# Patient Record
Sex: Female | Born: 1950
Health system: Southern US, Community
[De-identification: ages and names within clinical notes are randomized; demographics above are authoritative.]

## PROBLEM LIST (undated history)

## (undated) DIAGNOSIS — C801 Malignant (primary) neoplasm, unspecified: Secondary | ICD-10-CM

## (undated) DIAGNOSIS — J45909 Unspecified asthma, uncomplicated: Secondary | ICD-10-CM

## (undated) DIAGNOSIS — IMO0002 Reserved for concepts with insufficient information to code with codable children: Secondary | ICD-10-CM

## (undated) DIAGNOSIS — R112 Nausea with vomiting, unspecified: Secondary | ICD-10-CM

## (undated) DIAGNOSIS — D649 Anemia, unspecified: Secondary | ICD-10-CM

## (undated) DIAGNOSIS — E7800 Pure hypercholesterolemia, unspecified: Secondary | ICD-10-CM

## (undated) DIAGNOSIS — I499 Cardiac arrhythmia, unspecified: Secondary | ICD-10-CM

## (undated) DIAGNOSIS — C50919 Malignant neoplasm of unspecified site of unspecified female breast: Secondary | ICD-10-CM

## (undated) DIAGNOSIS — Z9889 Other specified postprocedural states: Secondary | ICD-10-CM

## (undated) DIAGNOSIS — T884XXA Failed or difficult intubation, initial encounter: Secondary | ICD-10-CM

## (undated) DIAGNOSIS — K219 Gastro-esophageal reflux disease without esophagitis: Secondary | ICD-10-CM

## (undated) DIAGNOSIS — Z923 Personal history of irradiation: Secondary | ICD-10-CM

## (undated) DIAGNOSIS — M069 Rheumatoid arthritis, unspecified: Secondary | ICD-10-CM

## (undated) HISTORY — DX: Pure hypercholesterolemia, unspecified: E78.00

## (undated) HISTORY — PX: PILONIDAL CYST EXCISION: SHX744

## (undated) HISTORY — DX: Malignant (primary) neoplasm, unspecified: C80.1

## (undated) HISTORY — DX: Reserved for concepts with insufficient information to code with codable children: IMO0002

## (undated) HISTORY — DX: Rheumatoid arthritis, unspecified: M06.9

## (undated) HISTORY — PX: COLPORRHAPHY: SHX921

## (undated) HISTORY — PX: FOOT SURGERY: SHX648

---

## 1987-02-16 HISTORY — PX: TOTAL VAGINAL HYSTERECTOMY: SHX2548

## 1998-01-29 ENCOUNTER — Other Ambulatory Visit: Admission: RE | Admit: 1998-01-29 | Discharge: 1998-01-29 | Payer: Self-pay | Admitting: Family Medicine

## 1998-07-24 ENCOUNTER — Ambulatory Visit (HOSPITAL_COMMUNITY): Admission: RE | Admit: 1998-07-24 | Discharge: 1998-07-24 | Payer: Self-pay | Admitting: Family Medicine

## 1999-12-03 ENCOUNTER — Ambulatory Visit (HOSPITAL_COMMUNITY): Admission: RE | Admit: 1999-12-03 | Discharge: 1999-12-03 | Payer: Self-pay

## 1999-12-18 ENCOUNTER — Ambulatory Visit (HOSPITAL_COMMUNITY): Admission: RE | Admit: 1999-12-18 | Discharge: 1999-12-18 | Payer: Self-pay

## 2000-01-01 ENCOUNTER — Ambulatory Visit (HOSPITAL_COMMUNITY): Admission: RE | Admit: 2000-01-01 | Discharge: 2000-01-01 | Payer: Self-pay

## 2001-02-15 HISTORY — PX: BREAST SURGERY: SHX581

## 2001-03-17 ENCOUNTER — Other Ambulatory Visit: Admission: RE | Admit: 2001-03-17 | Discharge: 2001-03-17 | Payer: Self-pay | Admitting: Obstetrics and Gynecology

## 2001-06-19 ENCOUNTER — Other Ambulatory Visit: Admission: RE | Admit: 2001-06-19 | Discharge: 2001-06-19 | Payer: Self-pay | Admitting: Obstetrics and Gynecology

## 2001-06-29 ENCOUNTER — Encounter: Payer: Self-pay | Admitting: Surgery

## 2001-06-29 ENCOUNTER — Encounter: Admission: RE | Admit: 2001-06-29 | Discharge: 2001-06-29 | Payer: Self-pay | Admitting: Surgery

## 2001-07-03 ENCOUNTER — Ambulatory Visit (HOSPITAL_BASED_OUTPATIENT_CLINIC_OR_DEPARTMENT_OTHER): Admission: RE | Admit: 2001-07-03 | Discharge: 2001-07-03 | Payer: Self-pay | Admitting: Surgery

## 2001-07-03 ENCOUNTER — Encounter: Payer: Self-pay | Admitting: Surgery

## 2001-07-03 ENCOUNTER — Encounter (INDEPENDENT_AMBULATORY_CARE_PROVIDER_SITE_OTHER): Payer: Self-pay | Admitting: *Deleted

## 2001-07-14 ENCOUNTER — Ambulatory Visit: Admission: RE | Admit: 2001-07-14 | Discharge: 2001-10-12 | Payer: Self-pay | Admitting: Surgery

## 2001-08-01 ENCOUNTER — Encounter: Admission: RE | Admit: 2001-08-01 | Discharge: 2001-08-01 | Payer: Self-pay | Admitting: Oncology

## 2001-08-01 ENCOUNTER — Encounter: Payer: Self-pay | Admitting: Oncology

## 2001-08-04 ENCOUNTER — Encounter: Payer: Self-pay | Admitting: Oncology

## 2001-08-04 ENCOUNTER — Ambulatory Visit (HOSPITAL_COMMUNITY): Admission: RE | Admit: 2001-08-04 | Discharge: 2001-08-04 | Payer: Self-pay | Admitting: Oncology

## 2001-08-28 ENCOUNTER — Other Ambulatory Visit: Admission: RE | Admit: 2001-08-28 | Discharge: 2001-08-28 | Payer: Self-pay | Admitting: Obstetrics and Gynecology

## 2001-08-30 ENCOUNTER — Ambulatory Visit (HOSPITAL_COMMUNITY): Admission: RE | Admit: 2001-08-30 | Discharge: 2001-08-30 | Payer: Self-pay | Admitting: Surgery

## 2001-08-30 ENCOUNTER — Encounter: Payer: Self-pay | Admitting: Surgery

## 2002-01-02 ENCOUNTER — Ambulatory Visit: Admission: RE | Admit: 2002-01-02 | Discharge: 2002-03-22 | Payer: Self-pay | Admitting: Radiation Oncology

## 2002-04-03 ENCOUNTER — Ambulatory Visit (HOSPITAL_BASED_OUTPATIENT_CLINIC_OR_DEPARTMENT_OTHER): Admission: RE | Admit: 2002-04-03 | Discharge: 2002-04-03 | Payer: Self-pay | Admitting: Surgery

## 2002-04-05 ENCOUNTER — Other Ambulatory Visit: Admission: RE | Admit: 2002-04-05 | Discharge: 2002-04-05 | Payer: Self-pay | Admitting: Obstetrics and Gynecology

## 2002-04-16 HISTORY — PX: PELVIC FLOOR REPAIR: SHX2192

## 2002-05-16 ENCOUNTER — Inpatient Hospital Stay (HOSPITAL_COMMUNITY): Admission: RE | Admit: 2002-05-16 | Discharge: 2002-05-17 | Payer: Self-pay | Admitting: Obstetrics and Gynecology

## 2002-09-18 ENCOUNTER — Other Ambulatory Visit: Admission: RE | Admit: 2002-09-18 | Discharge: 2002-09-18 | Payer: Self-pay | Admitting: Obstetrics and Gynecology

## 2002-10-12 ENCOUNTER — Ambulatory Visit (HOSPITAL_COMMUNITY): Admission: RE | Admit: 2002-10-12 | Discharge: 2002-10-12 | Payer: Self-pay | Admitting: *Deleted

## 2002-10-12 ENCOUNTER — Encounter: Payer: Self-pay | Admitting: Specialist

## 2002-11-16 HISTORY — PX: COLONOSCOPY: SHX174

## 2002-11-26 ENCOUNTER — Ambulatory Visit (HOSPITAL_COMMUNITY): Admission: RE | Admit: 2002-11-26 | Discharge: 2002-11-26 | Payer: Self-pay | Admitting: Gastroenterology

## 2003-06-27 ENCOUNTER — Other Ambulatory Visit: Admission: RE | Admit: 2003-06-27 | Discharge: 2003-06-27 | Payer: Self-pay | Admitting: Obstetrics and Gynecology

## 2003-09-05 ENCOUNTER — Encounter: Admission: RE | Admit: 2003-09-05 | Discharge: 2003-09-05 | Payer: Self-pay | Admitting: Family Medicine

## 2004-02-21 ENCOUNTER — Other Ambulatory Visit: Admission: RE | Admit: 2004-02-21 | Discharge: 2004-02-21 | Payer: Self-pay | Admitting: Obstetrics and Gynecology

## 2004-03-02 ENCOUNTER — Ambulatory Visit: Payer: Self-pay | Admitting: Oncology

## 2004-09-11 ENCOUNTER — Ambulatory Visit: Payer: Self-pay | Admitting: Oncology

## 2004-09-22 ENCOUNTER — Encounter: Admission: RE | Admit: 2004-09-22 | Discharge: 2004-09-22 | Payer: Self-pay | Admitting: Family Medicine

## 2004-10-10 ENCOUNTER — Encounter: Admission: RE | Admit: 2004-10-10 | Discharge: 2004-10-10 | Payer: Self-pay | Admitting: Neurosurgery

## 2005-03-15 ENCOUNTER — Ambulatory Visit: Payer: Self-pay | Admitting: Oncology

## 2005-05-24 ENCOUNTER — Other Ambulatory Visit: Admission: RE | Admit: 2005-05-24 | Discharge: 2005-05-24 | Payer: Self-pay | Admitting: Obstetrics and Gynecology

## 2005-09-07 ENCOUNTER — Ambulatory Visit: Payer: Self-pay | Admitting: Oncology

## 2005-09-13 LAB — CBC WITH DIFFERENTIAL/PLATELET
BASO%: 0.5 % (ref 0.0–2.0)
Basophils Absolute: 0 10e3/uL (ref 0.0–0.1)
EOS%: 3.9 % (ref 0.0–7.0)
Eosinophils Absolute: 0.2 10e3/uL (ref 0.0–0.5)
HCT: 36.6 % (ref 34.8–46.6)
HGB: 12.7 g/dL (ref 11.6–15.9)
LYMPH%: 22.3 % (ref 14.0–48.0)
MCH: 30.4 pg (ref 26.0–34.0)
MCHC: 34.7 g/dL (ref 32.0–36.0)
MCV: 87.5 fL (ref 81.0–101.0)
MONO#: 0.5 10e3/uL (ref 0.1–0.9)
MONO%: 7.3 % (ref 0.0–13.0)
NEUT#: 4.1 10e3/uL (ref 1.5–6.5)
NEUT%: 66 % (ref 39.6–76.8)
Platelets: 346 10e3/uL (ref 145–400)
RBC: 4.18 10e6/uL (ref 3.70–5.32)
RDW: 13.1 % (ref 11.3–14.5)
WBC: 6.2 10e3/uL (ref 3.9–10.0)
lymph#: 1.4 10e3/uL (ref 0.9–3.3)

## 2005-09-13 LAB — COMPREHENSIVE METABOLIC PANEL
Albumin: 4.6 g/dL (ref 3.5–5.2)
Alkaline Phosphatase: 57 U/L (ref 39–117)
BUN: 7 mg/dL (ref 6–23)
CO2: 30 mEq/L (ref 19–32)
Glucose, Bld: 82 mg/dL (ref 70–99)
Sodium: 142 mEq/L (ref 135–145)
Total Bilirubin: 0.5 mg/dL (ref 0.3–1.2)
Total Protein: 6.9 g/dL (ref 6.0–8.3)

## 2005-09-13 LAB — LACTATE DEHYDROGENASE: LDH: 146 U/L (ref 94–250)

## 2005-09-13 LAB — CANCER ANTIGEN 27.29: CA 27.29: 20 U/mL (ref 0–39)

## 2006-04-25 ENCOUNTER — Ambulatory Visit: Payer: Self-pay | Admitting: Oncology

## 2006-04-27 LAB — CBC WITH DIFFERENTIAL/PLATELET
BASO%: 0.2 % (ref 0.0–2.0)
Basophils Absolute: 0 10*3/uL (ref 0.0–0.1)
EOS%: 2.1 % (ref 0.0–7.0)
HGB: 12.2 g/dL (ref 11.6–15.9)
MCH: 28.9 pg (ref 26.0–34.0)
MCHC: 35.1 g/dL (ref 32.0–36.0)
MCV: 82.4 fL (ref 81.0–101.0)
MONO%: 7.2 % (ref 0.0–13.0)
RBC: 4.21 10*6/uL (ref 3.70–5.32)
RDW: 12.2 % (ref 11.3–14.5)
lymph#: 1.3 10*3/uL (ref 0.9–3.3)

## 2006-04-27 LAB — COMPREHENSIVE METABOLIC PANEL
ALT: 13 U/L (ref 0–35)
Alkaline Phosphatase: 65 U/L (ref 39–117)
CO2: 26 mEq/L (ref 19–32)
Creatinine, Ser: 0.58 mg/dL (ref 0.40–1.20)
Sodium: 141 mEq/L (ref 135–145)
Total Bilirubin: 0.4 mg/dL (ref 0.3–1.2)
Total Protein: 7 g/dL (ref 6.0–8.3)

## 2006-04-27 LAB — LACTATE DEHYDROGENASE: LDH: 149 U/L (ref 94–250)

## 2006-04-27 LAB — CANCER ANTIGEN 27.29: CA 27.29: 19 U/mL (ref 0–39)

## 2006-06-28 ENCOUNTER — Other Ambulatory Visit: Admission: RE | Admit: 2006-06-28 | Discharge: 2006-06-28 | Payer: Self-pay | Admitting: Obstetrics & Gynecology

## 2007-09-19 ENCOUNTER — Other Ambulatory Visit: Admission: RE | Admit: 2007-09-19 | Discharge: 2007-09-19 | Payer: Self-pay | Admitting: Obstetrics & Gynecology

## 2007-10-18 ENCOUNTER — Encounter: Payer: Self-pay | Admitting: Internal Medicine

## 2007-11-15 ENCOUNTER — Ambulatory Visit: Payer: Self-pay | Admitting: Internal Medicine

## 2007-11-15 DIAGNOSIS — R0602 Shortness of breath: Secondary | ICD-10-CM

## 2007-11-15 DIAGNOSIS — R0600 Dyspnea, unspecified: Secondary | ICD-10-CM | POA: Insufficient documentation

## 2007-11-25 DIAGNOSIS — G4700 Insomnia, unspecified: Secondary | ICD-10-CM | POA: Insufficient documentation

## 2008-12-16 HISTORY — PX: COLONOSCOPY: SHX174

## 2008-12-16 HISTORY — PX: ESOPHAGOGASTRODUODENOSCOPY ENDOSCOPY: SHX5814

## 2009-03-13 ENCOUNTER — Ambulatory Visit (HOSPITAL_COMMUNITY): Admission: RE | Admit: 2009-03-13 | Discharge: 2009-03-13 | Payer: Self-pay | Admitting: Rheumatology

## 2010-07-03 NOTE — Op Note (Signed)
NAME:  Kristy Perez, Kristy Perez                           ACCOUNT NO.:  [id number]   MEDICAL RECORD NO.:  [id number]                   PATIENT TYPE:  AMB   LOCATION:  ENDO                                 FACILITY:  [facility]   PHYSICIAN:  Anselmo Rod, M.D.               DATE OF BIRTH:  [birth year]   DATE OF PROCEDURE:  11/26/2002  DATE OF DISCHARGE:                                 OPERATIVE REPORT   PROCEDURE:  Screening colonoscopy.   ENDOSCOPIST:  Anselmo Rod, M.D.   INSTRUMENT USED:  Olympus video colonoscope.   INDICATIONS FOR PROCEDURE:  The patient is a 60 year old white female with a  personal history of breast cancer undergoing screening colonoscopy to rule  out colonic polyps, masses, etc.   PRE-PROCEDURE PREPARATION:  Informed consent was procured from the patient.  The patient was fasted for eight hours prior to the procedure and prepped  with a bottle of magnesium citrate and a gallon of GoLYTELY the night prior  to the procedure.   PRE-PROCEDURE PHYSICAL:  VITAL SIGNS:  Stable.  NECK:  Supple.  CHEST:  Clear to auscultation.  CARDIOVASCULAR:  S1 and S2 are regular.  ABDOMEN:  Soft with normal bowel sounds.   DESCRIPTION OF PROCEDURE:  The patient was placed in the left lateral  decubitus position and sedated with 30 mg of Demerol and 3 mg of Versed  intravenously.  Once the patient was adequately sedated and maintained on  low flow oxygen and continuous cardiac monitoring, the Olympus video  colonoscope was advanced from the rectum to the cecum with slight  difficulty.  The patient had extremely low blood pressures of 90/60 and then  80/24 and therefore no more sedation could be given.  The scope was easily  advanced into the cecum.  The appendiceal orifice and ileocecal valve were  visualized and photographed.  Small internal hemorrhoids were seen on  retroflexion of the rectum.  No other masses, polyps, erosions, ulcerations  or diverticula were noted.   IMPRESSION:  Normal colonoscopy to the cecum except for small internal  hemorrhoids.   RECOMMENDATIONS:  1. Continue a high fiber diet with liberal fluid intake.  2. Repeat colonoscopy screening in the next five years unless the patient     develops any abdominal symptoms in the interim.  3. Outpatient follow-up on a p.r.n. basis.                                               Anselmo Rod, M.D.    JNM/MEDQ  D:  11/26/2002  T:  11/26/2002  Job:  [id number]   cc:   Laqueta Linden, M.D.  [address]  [place]  [place] [address]  Fax: [phone]   Marjory Lies,  M.D.  [address]  [place]  [place] [address]  Fax: [phone]   Pierce Crane, M.D.  [address] - [facility]  [place]  [place] [address]  Fax: [phone]

## 2010-07-03 NOTE — Op Note (Signed)
[place]  Patient:    TAEYA, THEALL Visit Number: [id number] MRN: [id number]          Service Type: DSU Location: [facility] Attending Physician:  Katha Cabal Dictated by:   Thornton Park Daphine Deutscher, M.D. Proc. Date: 07/03/01 Admit Date:  07/03/2001 Discharge Date: 07/03/2001   CC:         Laqueta Linden, M.D.  Jeralyn Ruths, M.D.   Operative Report  PREOPERATIVE DIAGNOSIS:  A 60 year old lady with a palpable mass that has arisen in the left upper outer quadrant. Needle aspirate per Laqueta Linden, M.D. revealed malignant cells. She was seen in the office and informed consent was obtained regarding left sentinel node mapping and left breast lumpectomy.  POSTOPERATIVE DIAGNOSIS:  A 60 year old lady with a palpable mass that has arisen in the left upper outer quadrant. Needle aspirate per Laqueta Linden, M.D. revealed malignant cells. She was seen in the office and informed consent was obtained regarding left sentinel node mapping and left breast lumpectomy, pathology pending.  OPERATION PERFORMED:  Left breast needle biopsy and sentinel node biopsies.  SURGEON:  Thornton Park. Daphine Deutscher, M.D.  ANESTHESIA:  General/LMA.  DESCRIPTION OF PROCEDURE:  The patient was taken room #2 at [facility] after injection with radionuclide in the left breast lesion. Prior to prepping and draping, I did use some alcohol and Lymphazurin blue and injected that in the periareolar region and over the tumor. The patient was then prepped with Betadine and draped sterilely. I mapped the axilla and I found the hot spot and marked it and then created a small incision over that along the skin lines. I went up into the axilla and found some areas that were certainly not near each other and biopsied the closest one depthwise and removed a hot node and I put it down as a node one as "hot sentinel node." I then went slightly posterior and inferior and found one that  had counts of several thousand and dissected it free from the surrounding tissue. Using clips to clip the lymphatics - this was blue and hot and I called that the "hottest node" and the #1 sentinel node. Finally, there was still some activity and I found another one yet higher and posterior and I dissected it free and removed it as the third node. There was no other activity to speak of in the axilla. The axilla was irrigated. There was no bleeding or lymphazurin blue leaks. I then closed the skin with 4-0 Vicryl subcutaneously and subcuticularly and then began working with the breast lesion itself. Using new equipment, I made an incision over the breast mass and raised flaps superiorly and inferiorly using electrocautery. I carried these down inferiorly and superiorly, medially and laterally and came down to the chest wall. I then got around this when I could palpate using scissors, mobilized the lesion and brought it up into the wound. It was excised and its position was oriented with three sutures, the smallest near the nipple, the longest in the axilla and the medium one was posterior. The wound was irrigated. The skin was approximated with 4-0 Vicryl subcutaneously and subcuticularly with Benzoin and Steri-Strips on the skin. A fluffy dressing was applied.  DISPOSITION:  The patient will be given some Mepergan Fortis to take for pain and will be followed up in the office in approximately two weeks if not sooner. Dictated by:   Thornton Park Daphine Deutscher, M.D. Attending Physician:  Luretha Murphy  B[place] DD:  07/03/01 TD:  07/05/01 Job: [id number]

## 2010-07-03 NOTE — Op Note (Signed)
[facility]  Patient:    Kristy Perez, Kristy Perez Visit Number: [id number] MRN: [id number]          Service Type: DSU Location: DAY Attending Physician:  Katha Cabal Dictated by:   Thornton Park Daphine Deutscher, M.D. Proc. Date: 08/30/01 Admit Date:  08/30/2001 Discharge Date: 08/30/2001   CC:         Pierce Crane, M.D.  Delorse Lek, M.D.  Laqueta Linden, M.D.   Operative Report  PREOPERATIVE DIAGNOSIS:  Left breast cancer, status post lumpectomy and sentinel node biopsy for IV access.  POSTOPERATIVE DIAGNOSIS:  Left breast cancer, chest pain lumpectomy and sentinel node biopsy for IV access.  PROCEDURE:  Placement of right subclavian Port-A-Cath.  SURGEON:  Thornton Park. Daphine Deutscher, M.D.  ANESTHESIA:  MAC.  DESCRIPTION OF PROCEDURE:  Kristy Perez is a 60 year old lady, who recently underwent lumpectomy, axillary sentinel lymph node biopsy for left breast cancer.  She was taken to room 6 and given intravenous sedation.  The chest and neck were prepped with Betadine and draped sterilely.  I went on the right side and infiltrated an area beneath the clavicle with lidocaine and cannulated the right subclavian vein on the first pass.  The wire was passed into the superior vena cava and caval atrial junction.  I then created a pocket on the right chest, making a transverse incision after numbing that up with lidocaine and then using two fingers to subcutaneously dissect the pocket.  There was a superficial vessel which I ligated with 4-0 Vicryl.  The Port-A-Cath was then placed in the pocket, tunneled out to the exit site of the wire, and secured with two 2-0 Prolene sutures on either side of the port to secure it inside the cavity.  It was then flushed.  I then measured approximately 17 cm from the skin and transected the catheter at that point. I passed the obturator and peel-away sheath over the wire and then removed the obturator and wire and then passed  the catheter and peeled away the sheath under fluoroscopic control.  This placed the catheter in good position.  This was then flushed with concentrated heparin.  This flushing was done following an aspiration, at which time the Port-A-Cath aspirated easily.  The wounds were then closed with 4-0 Vicryl with Steri-Strips.  A right-angled Huber needle was inserted into the port and again, it was withdrawn back and flushed with concentrated heparin.  This was left connected to the port but clamped and a cap on the end.  She will be receiving chemotherapy in the next two days per Dr. Donnie Coffin.  The patient will be given Coumadin 1 mg to take 1 mg every other day for approval by Dr. Donnie Coffin and will be seen back in the office by me in several weeks for routine postop.  Chest x-ray will be obtained in the recovery room. Dictated by:   Thornton Park Daphine Deutscher, M.D. Attending Physician:  Katha Cabal DD:  08/30/01 TD:  09/02/01 Job: [id number]

## 2010-07-03 NOTE — Op Note (Signed)
NAME:  Kristy Perez, SCHNIDER                           ACCOUNT NO.:  [id number]   MEDICAL RECORD NO.:  [id number]                   PATIENT TYPE:  AMB   LOCATION:  DAY                                  FACILITY:  [facility]   PHYSICIAN:  Laqueta Linden, M.D.                 DATE OF BIRTH:  01-Mar-1950   DATE OF PROCEDURE:  05/15/2002  DATE OF DISCHARGE:                                 OPERATIVE REPORT   PREOPERATIVE DIAGNOSES:  1. Cystocele.  2. Rectocele.  3. Stress urinary incontinence.   POSTOPERATIVE DIAGNOSES:  1. Cystocele.  2. Rectocele.  3. Stress urinary incontinence.   PROCEDURES:  1. Anterior and posterior colporrhaphy.  2. SPARC sling procedure.   SURGEON:  Procedure #1 - Laqueta Linden, M.D.,  Procedure #2 - Bertram Millard. Dahlstedt, M.D.   ASSISTANT:  Procedure #1 - Seward Meth. Clelia Croft, M.D.   ANESTHESIA:  General endotracheal.   ESTIMATED BLOOD LOSS:  200 mL.   URINE OUTPUT:  100 mL.   FLUIDS:  1200 mL crystalloid.   INSTRUMENT COUNT:  Correct x2.   COMPLICATIONS:  None.   INDICATIONS FOR PROCEDURE:  The patient is a 60 year old menopausal female  with symptomatic cystocele and rectocele and stress urinary incontinence.  She underwent a vaginal hysterectomy and anterior repair approximately five  years ago with recurrent prolapse.  She also notes worsening of stress  urinary incontinence since that time, particularly with pessary placement.  She has recently undergone treatment for breast cancer including lumpectomy  with sentinel node radiation and chemotherapy.  She has had an Estring  vaginal ring in place.  She desires definitive surgical management.  She was  seen in consultation with Bertram Millard. Dahlstedt, M.D. who agreed that she  would benefit from a [facility] sling in addition to repair of her prolapse.  She  has been counseled as to the risks, benefits, alternatives, and  complications of the procedure including, but not limited to, anesthesia  risks, infection,  bleeding possibly requiring transfusion, injury to bowel,  bladder, ureteral, vessels, nodes, possible need for laparotomy, possibility  of fistula formation, the possibility of recurrent prolapse and/or urinary  incontinence such as urinary retention as well as risks of DVT, PE,  pneumonia, death, and other unknown risks.  She has voiced her understanding  and acceptance of all risks and had extensive discussions with both surgeons  and agrees to proceed.   PROCEDURE:  The patient was taken to the operating room.  After proper  identification and consents were ascertained,  she was placed on the  operating table in the supine position.  After the induction of general  endotracheal anesthesia, she was placed in the Pike stirrups in the dorsal  lithotomy position and the lower abdomen, perineum, and vagina were prepped  and draped in routine sterile fashion.  A transurethral Foley was placed.  A  weighted speculum was  placed in the posterior vagina.  The anal dimples were  grasped with Allis clamps and advanced into the operative field.  The  sterile injectable saline was then used for hydrodissection.  The vaginal  cuff was injected and a transverse incision made across the vaginal cuff  from uterosacral to uterosacral ligament.  This was then grasped with Allis  clamps and undermined and incised in the midline.  Successive injections of  sterile saline were then used and the vaginal mucosa was incised to the  level of the urethrovesical junction.  The vaginal mucosa was then sharply  and bluntly dissected off of the paravesical tissues.  At this point, Dr.  Retta Diones proceeded with the [facility] sling procedure which will be dictated in  a separate operative note.  At the conclusion of this, the cystocele was  reduced using interrupted sutures pulling the paravesical fascia to the  midline with reduction of the cystocele.  Redundant vaginal mucosa was  trimmed.  Several small bleeding points  were cauterized.  The anterior  vaginal wall was then closed in a running-locked fashion to the apex of the  vagina.  Attention was then turned posteriorly.  A triangular incision was  made at the introitus with excision of scar tissue.  The posterior vaginal  mucosa was then injected, undermined, and incised in the midline to the  level of the sutures of the anterior repair.  Again, the vaginal mucosa was  bluntly and sharply off of the perirectal tissues.  The uterosacral  ligaments were identified and were brought together in the midline to  decrease enterocele formation.  The perirectal fascia was then closed with  reduction of the rectocele with interrupted 2-0 Vicryl sutures.  Redundant  vaginal mucosa was trimmed and the posterior vaginal mucosa was then closed  in a running-locked fashion.  The levators were pulled together in the  midline for additional perineal support, taking care not to over-narrow the  introitus.  The perineum was closed in a routine episiotomy fashion.  Rectal  examination confirmed good thickness of the rectovaginal septum with no  palpable sutures through the rectal mucosa.  Vaginal packing of 1-inch plain  gauze soaked with estrogen cream was then placed.  Peri-Pad was applied.  The Foley catheter was connected to straight drainage.  The patient will be  admitted for overnight observation with removal of the vaginal pack and  Foley in the morning and assessment of voiding status prior to discharge.  She was stable and extubated on transfer to the recovery room.  Estimated  blood loss 200 mL.  Urine output 100 mL.  Fluids 1200 mL of crystalloid.  Counts correct x2.  Complications none.                                               Laqueta Linden, M.D.    LKS/MEDQ  D:  05/15/2002  T:  05/15/2002  Job:  [id number]   cc:   Thornton Park Daphine Deutscher, M.D.  [address], [address]  [place]  [place] [address]  Fax: [phone]   Pierce Crane, M.D. [address] -  [facility]  [place]  [place] [address]  Fax: [phone]   Bertram Millard. Dahlstedt, M.D.  [address]  [place]  [place] [address]  Fax: [phone]   Billie Lade, M.D.  [phone]  [facility] - [facility]  [place]  [place]  [address]  Fax: [phone]

## 2010-07-03 NOTE — Op Note (Signed)
NAME:  Kristy Perez, Kristy Perez                           ACCOUNT NO.:  [id number]   MEDICAL RECORD NO.:  [id number]                   PATIENT TYPE:  INP   LOCATION:  0467                                 FACILITY:  [facility]   PHYSICIAN:  Bertram Millard. Dahlstedt, M.D.          DATE OF BIRTH:  [birth year]   DATE OF PROCEDURE:  05/15/2002  DATE OF DISCHARGE:  05/17/2002                                 OPERATIVE REPORT   PREOPERATIVE DIAGNOSIS:  Stress urinary incontinence.   POSTOPERATIVE DIAGNOSIS:  Stress urinary incontinence.   PROCEDURE:  SPARC vaginal sling.   SURGEON:  Bertram Millard. Dahlstedt, M.D.   ANESTHESIA:  General endotracheal.   ESTIMATED BLOOD LOSS:  50 cc.   COMPLICATIONS:  None.   INDICATIONS FOR PROCEDURE:  A 60 year old menopausal female with a history  of stress urinary incontinence.  She has a symptomatic cystocele and  rectocele. She was sent to see me by Dr. Myrlene Broker for evaluation of stress  urinary incontinence.  It was anticipated the patient would have surgical  repair of her pelvic relaxation.  In the office, she was found to have  simple stress urinary incontinence. There was no evidence of bladder  instability. She had normal bladder emptying and no history of infections.   It was recommended that if she was going to undergo gynecologic procedure,  that we add a surgical procedure to cure her stress urinary incontinence.  Options were discussed with the patient. Due to its limited invasive nature,  I felt that the [facility] procedure would be the best fit for the patient. She  desires to proceed with this. She is aware of the risks and complications  and desires to proceed.   DESCRIPTION OF PROCEDURE:  The patient had been given preoperative  antibiotics.  The majority of the anterior repair had been completed by Dr.  Myrlene Broker.  At this point, I came in. The vaginal mucosa had already been  incised anteriorly in the vaginal wall.  The bladder neck was quite  evident,  as there was a catheter present. The balloon was palpated.   Two small incisions were made in the pubic area just to the left and right  of the midline. Through these, the [facility] needles were passed posterior to  the pubis bilaterally.  Once these were passed through the retropubic space  and through the vaginal incision bilaterally, the scopic view of the bladder  revealed no evident puncture of the bladder wall. I then put the [facility] tape  on the free ends of the needles and brought the tape up through the  retropubic space, through the skin.  Adequate positioning was felt to have  been attained at the mid-urethra.  A small amount of slack was left in this  area.  At this point, the plastic covering on the [facility] tape was removed.  Again, the mid-urethra was inspected and there was a small amount  of slack  just underneath the urethra with this tape.  The vaginal tape was cut  underneath both the skin incisions.  Skin was closed with Dermabond.   At this point, the [facility] procedure was terminated.  The bladder was  catheterized with a 15 French Foley catheter and hooked to dependent  drainage.  At this point, the operation by Dr. Katrinka Blazing then recommenced. The  patient tolerated the [facility] procedure well.                                                  Bertram Millard. Dahlstedt, M.D.    SMD/MEDQ  D:  05/30/2002  T:  05/30/2002  Job:  [id number]

## 2010-07-03 NOTE — Op Note (Signed)
   NAME:  Kristy Perez, Kristy Perez                           ACCOUNT NO.:  [id number]   MEDICAL RECORD NO.:  [id number]                   PATIENT TYPE:  AMB   LOCATION:  DSC                                  FACILITY:  [facility]   PHYSICIAN:  Thornton Park. Daphine Deutscher, M.D.             DATE OF BIRTH:  03/04/1950   DATE OF PROCEDURE:  04/03/2002  DATE OF DISCHARGE:                                 OPERATIVE REPORT   PREOPERATIVE DIAGNOSIS:  Right subclavian Port-A-Cath.   POSTOPERATIVE DIAGNOSIS:  Right subclavian Port-A-Cath, status post removal.   OPERATION:  Explantation of the Port-A-Cath device.   SURGEON:  Thornton Park. Daphine Deutscher, M.D.   ANESTHESIA:  MAC local   DESCRIPTION OF PROCEDURE:  The patient was taken to room 3 and given  significant sedation.  Her right breast was prepped with Betadine and draped  sterilely.  I infiltrated the area with 1% Lidocaine and excised her old  scar.  I then dissected down to the port and removed the two Prolene sutures  holding it to the chest wall.  It was explanted without difficulty and in  toto.  The wound was approximated with 5-0 Vicryl both inside and  subcuticularly in interrupted fashion.  Benzoin and Steri-Strips were used  on the skin.  Per patient request, the patient was given [employer] as-  needed for pain.  She will be seen back in the office in approximately three  weeks in follow up.   FINAL DIAGNOSIS:  Status post lumpectomy, radiation chemotherapy for left  breast cancer, status post explantation of Port-A-Cath.                                                Thornton Park Daphine Deutscher, M.D.    MBM/MEDQ  D:  04/03/2002  T:  04/03/2002  Job:  [id number]   cc:   Marjory Lies, M.D.  [address]  [place]  [place] [address]  Fax: [phone]   Laqueta Linden, M.D.  [address]  [place]  [place] [address]  Fax: [phone]   Pierce Crane, M.D.  [address] - [facility]  [place]  [place] [address]  Fax: [phone]

## 2012-12-19 ENCOUNTER — Encounter: Payer: Self-pay | Admitting: Nurse Practitioner

## 2012-12-19 ENCOUNTER — Ambulatory Visit (INDEPENDENT_AMBULATORY_CARE_PROVIDER_SITE_OTHER): Payer: 59 | Admitting: Nurse Practitioner

## 2012-12-19 VITALS — BP 110/62 | HR 68 | Resp 16 | Ht 62.0 in | Wt 131.0 lb

## 2012-12-19 DIAGNOSIS — Z0000 Encounter for general adult medical examination without abnormal findings: Secondary | ICD-10-CM

## 2012-12-19 DIAGNOSIS — Z01419 Encounter for gynecological examination (general) (routine) without abnormal findings: Secondary | ICD-10-CM

## 2012-12-19 DIAGNOSIS — M810 Age-related osteoporosis without current pathological fracture: Secondary | ICD-10-CM

## 2012-12-19 LAB — POCT URINALYSIS DIPSTICK
Bilirubin, UA: NEGATIVE
Ketones, UA: NEGATIVE
Leukocytes, UA: NEGATIVE
pH, UA: 7

## 2012-12-19 MED ORDER — VITAMIN D (ERGOCALCIFEROL) 1.25 MG (50000 UNIT) PO CAPS: 50000.0000 [IU] | ORAL_CAPSULE | ORAL | Status: DC

## 2012-12-19 MED ORDER — ESTRADIOL 0.1 MG/GM VA CREA: TOPICAL_CREAM | VAGINAL | Status: DC

## 2012-12-19 NOTE — Patient Instructions (Signed)

## 2012-12-19 NOTE — Progress Notes (Signed)
Patient ID: Kristy Perez, female   DOB: 1950-09-02, 62 y.o.   MRN: [id number] 62 y.o. [id number] Divorced Caucasian Fe here for annual exam.  No new health concerns other than continual joint pain from RA.  She is not SA in years.  She only uses estrace vaginal cream pea size amount to the urethra once every 2 weeks.  No LMP recorded. Patient has had a hysterectomy.          Sexually active: no  The current method of family planning is abstinence.    Exercising: no  Exercise is limited by rheumatoid arthritis. Smoker:  no  Health Maintenance: Pap:  05/21/09, WNL MMG:  08/15/12, Brads 2: benign findings Colonoscopy:  11/10, repeat in 5 years BMD:   9/11 TDaP:  UTD Labs: HB: declined Urine: negative   reports that she quit smoking about 13 years ago. Her smoking use included Cigarettes. She smoked 0.00 packs per day. She has never used smokeless tobacco. She reports that she does not drink alcohol.  Past Medical History  Diagnosis Date  . Cancer     breast, left  . Rheumatoid arthritis     on Humira  . Chiari malformation     Past Surgical History  Procedure Laterality Date  . Total vaginal hysterectomy  1989    with AP repair  . Colporrhaphy    . Breast surgery Left 2003    Lumpectomy T1C1, NO and negative ER/ PR  . Pelvic floor repair  3/04    SPARC with AP colporrhaphy  . Colonoscopy  10/04  . Colonoscopy  11/10    normal recheck in 5 years  . Esophagogastroduodenoscopy endoscopy  11/10    GERD  . Foot surgery Left age 76    removal of pseudo rheumatoid nodules from left forefoot.    Current Outpatient Prescriptions  Medication Sig Dispense Refill  . diazepam (VALIUM) 5 MG tablet Take 1 tablet by mouth as needed.      . Flaxseed, Linseed, (FLAX SEED OIL PO) Take by mouth.      Marland Kitchen HUMIRA 40 MG/0.8ML injection Inject 40 mg into the skin every 14 (fourteen) days.      . Ibuprofen 200 MG CAPS Take 1-2 capsules by mouth daily.      Marland Kitchen lidocaine (LIDODERM) 5 % Place 1 patch onto  the skin as needed.      . Magnesium 250 MG TABS Take 4 tablets by mouth daily.      . mometasone-formoterol (DULERA) 100-5 MCG/ACT AERO Inhale 2 puffs into the lungs daily.      Marland Kitchen omeprazole (PRILOSEC) 40 MG capsule Take 2 capsules by mouth daily.      . predniSONE (DELTASONE) 5 MG tablet Take 1.5 tablets by mouth daily.      . promethazine (PHENERGAN) 25 MG tablet Take 1 tablet by mouth as needed.      . RESTASIS 0.05 % ophthalmic emulsion Place 1 drop into both eyes 2 (two) times daily.      . temazepam (RESTORIL) 30 MG capsule Take 1 capsule by mouth at bedtime.      . VENTOLIN HFA 108 (90 BASE) MCG/ACT inhaler Inhale 2 puffs into the lungs every 4 (four) hours as needed.      . Vitamin D, Ergocalciferol, (DRISDOL) 50000 UNITS CAPS capsule Take 1 capsule (50,000 Units total) by mouth once a week.  30 capsule  3  . estradiol (ESTRACE) 0.1 MG/GM vaginal cream Use 1/2 g vaginally twice weekly  42.5 g  3   No current facility-administered medications for this visit.    Family History  Problem Relation Age of Onset  . Diabetes Mother   . Heart failure Mother   . Bladder Cancer Mother   . Stroke Mother   . Uterine cancer Maternal Aunt   . Bladder Cancer Maternal Grandmother   . Colon cancer Maternal Grandmother   . Cancer Father     head and neck    ROS:  Pertinent items are noted in HPI.  Otherwise, a comprehensive ROS was negative.  Exam:   BP 110/62  Pulse 68  Resp 16  Ht 5\' 2"  (1.575 m)  Wt 131 lb (59.421 kg)  BMI 23.95 kg/m2 Height: 5\' 2"  (157.5 cm)  Ht Readings from Last 3 Encounters:  12/19/12 5\' 2"  (1.575 m)    General appearance: alert, cooperative and appears stated age Head: Normocephalic, without obvious abnormality, atraumatic Neck: no adenopathy, supple, symmetrical, trachea midline and thyroid normal to inspection and palpation Lungs: clear to auscultation bilaterally Breasts: normal appearance, no masses or tenderness, on the right.  But surgical and  radiation changes to left breast Heart: regular rate and rhythm Abdomen: soft, non-tender; no masses,  no organomegaly Extremities: extremities normal, atraumatic, no cyanosis or edema Skin: Skin color, texture, turgor normal. No rashes or lesions Lymph nodes: Cervical, supraclavicular, and axillary nodes normal. No abnormal inguinal nodes palpated Neurologic: Grossly normal   Pelvic: External genitalia:  no lesions              Urethra:  Pale atrophic changes appearing urethra with no masses, tenderness or lesions              Bartholin's and Skene's: normal                 Vagina: atrophic  appearing vagina with pale color and discharge, no lesions              Cervix: absent              Pap taken: no Bimanual Exam:  Uterus:  uterus absent              Adnexa: no mass, fullness, tenderness               Rectovaginal: Confirms               Anus:  normal sphincter tone, no lesions  A:  Well Woman with normal exam  S/P left breast Cancer 6/03 with ER/PR negative  Urethral prolapse using vaginal estrogen only prn to the urethra.  RA  S/P TVH with AP repair 1989  Osteoporosis  Stable at 2011  Vit D deficiency  P:   Pap smear as per guidelines   Mammogram due 7/15  Order placed for BMD  Note given to patient for labs to be done at rheumatologist for  Vit D and send results here.  Refill on Estrace vaginal cream to use pea size to the urethra only prn.  Counseled on breast self exam, adequate intake of calcium and vitamin D, diet and exercise return annually or prn  An After Visit Summary was printed and given to the patient.

## 2012-12-20 NOTE — Progress Notes (Signed)
Encounter reviewed by Dr. Brook Silva.  

## 2013-01-04 ENCOUNTER — Other Ambulatory Visit: Payer: Self-pay | Admitting: Nurse Practitioner

## 2013-01-12 LAB — VITAMIN D 25 HYDROXY (VIT D DEFICIENCY, FRACTURES): COMMENTS:: 42 ng/mL (ref 30–89)

## 2013-02-15 DIAGNOSIS — I499 Cardiac arrhythmia, unspecified: Secondary | ICD-10-CM

## 2013-02-15 HISTORY — DX: Cardiac arrhythmia, unspecified: I49.9

## 2013-05-02 ENCOUNTER — Encounter (HOSPITAL_COMMUNITY): Payer: Self-pay | Admitting: Emergency Medicine

## 2013-05-02 ENCOUNTER — Emergency Department (HOSPITAL_COMMUNITY): Payer: BC Managed Care – PPO

## 2013-05-02 ENCOUNTER — Emergency Department (HOSPITAL_COMMUNITY)
Admission: EM | Admit: 2013-05-02 | Discharge: 2013-05-02 | Disposition: A | Discharge status: Home / Self Care | Payer: BC Managed Care – PPO | Attending: Emergency Medicine | Admitting: Emergency Medicine

## 2013-05-02 DIAGNOSIS — M25559 Pain in unspecified hip: Secondary | ICD-10-CM | POA: Insufficient documentation

## 2013-05-02 DIAGNOSIS — M25519 Pain in unspecified shoulder: Secondary | ICD-10-CM | POA: Insufficient documentation

## 2013-05-02 DIAGNOSIS — Z853 Personal history of malignant neoplasm of breast: Secondary | ICD-10-CM | POA: Insufficient documentation

## 2013-05-02 DIAGNOSIS — Z87891 Personal history of nicotine dependence: Secondary | ICD-10-CM | POA: Insufficient documentation

## 2013-05-02 DIAGNOSIS — IMO0002 Reserved for concepts with insufficient information to code with codable children: Secondary | ICD-10-CM | POA: Insufficient documentation

## 2013-05-02 DIAGNOSIS — Z79899 Other long term (current) drug therapy: Secondary | ICD-10-CM | POA: Insufficient documentation

## 2013-05-02 DIAGNOSIS — M069 Rheumatoid arthritis, unspecified: Secondary | ICD-10-CM | POA: Insufficient documentation

## 2013-05-02 DIAGNOSIS — M25551 Pain in right hip: Secondary | ICD-10-CM

## 2013-05-02 DIAGNOSIS — M79609 Pain in unspecified limb: Secondary | ICD-10-CM | POA: Insufficient documentation

## 2013-05-02 MED ORDER — PROMETHAZINE HCL 25 MG/ML IJ SOLN
12.5000 mg | Freq: Once | INTRAMUSCULAR | Status: DC
  Filled 2013-05-02: qty 1

## 2013-05-02 MED ORDER — PROMETHAZINE HCL 25 MG/ML IJ SOLN
12.5000 mg | Freq: Once | INTRAMUSCULAR | Status: AC
  Administered 2013-05-02: 12.5 mg via INTRAMUSCULAR

## 2013-05-02 MED ORDER — OXYCODONE-ACETAMINOPHEN 5-325 MG PO TABS: 1.0000 | ORAL_TABLET | ORAL | Status: DC | PRN

## 2013-05-02 MED ORDER — OXYCODONE-ACETAMINOPHEN 5-325 MG PO TABS
1.0000 | ORAL_TABLET | Freq: Once | ORAL | Status: AC
  Administered 2013-05-02: 1 via ORAL
  Filled 2013-05-02: qty 1

## 2013-05-02 NOTE — ED Notes (Signed)
Per EMS, patient has rheumatoid arthritis and has chronic pain.  Patient has had L shoulder pain for 1 year and is supposed to "have surgery for a tear".   Patient states she was fine yesterday and worked.  Patient states this morning she woke up with severe hip and R leg pain, but denies injury.  Patient states is unable to walk at this time.

## 2013-05-02 NOTE — ED Provider Notes (Signed)
CSN: [id number]     Arrival date & time 05/02/13  8676 History  This chart was scribed for non-physician practitioner Charlann Lange PA-C working with Juanda Crumble B. Karle Starch, MD by Rolanda Lundborg, ED Scribe. This patient was seen in room TR11C/TR11C and the patient's care was started at 10:01 AM.    Chief Complaint  Patient presents with  . Hip Pain  . Shoulder Pain  . Leg Pain   The history is provided by the patient. No language interpreter was used.   HPI Comments: Kristy Perez is a 63 y.o. female brought in by ambulance with a h/o rheumatoid arthritis who presents to the Emergency Department complaining of severe, constant pain in her right hip and leg since last night. She states her hips have bothered her before but not like this. She denies back pain or h/o back problems. States she has been unable to ambulate. She denies injury. She states she had no pain during the day yesterday. She has been taking ibuprofen. She states she takes hydrocodone occasionally but it makes her vomit.    Pt also reports left shoulder pain that has been ongoing for the past year and states she is supposed to get surgery for a "tear" tomorrow.   Past Medical History  Diagnosis Date  . Cancer     breast, left  . Rheumatoid arthritis     on Humira  . Chiari malformation    Past Surgical History  Procedure Laterality Date  . Total vaginal hysterectomy  1989    with AP repair  . Colporrhaphy    . Breast surgery Left 2003    Lumpectomy T1C1, NO and negative ER/ PR  . Pelvic floor repair  3/04    SPARC with AP colporrhaphy  . Colonoscopy  10/04  . Colonoscopy  11/10    normal recheck in 5 years  . Esophagogastroduodenoscopy endoscopy  11/10    GERD  . Foot surgery Left age 28    removal of pseudo rheumatoid nodules from left forefoot.   Family History  Problem Relation Age of Onset  . Diabetes Mother   . Heart failure Mother   . Bladder Cancer Mother   . Stroke Mother   . Uterine cancer Maternal  Aunt   . Bladder Cancer Maternal Grandmother   . Colon cancer Maternal Grandmother   . Cancer Father     head and neck   History  Substance Use Topics  . Smoking status: Former Smoker    Types: Cigarettes    Quit date: 02/16/1999  . Smokeless tobacco: Never Used  . Alcohol Use: No   OB History   Grav Para Term Preterm Abortions TAB SAB Ect Mult Living   3 2 2  1     2      Review of Systems  All other systems reviewed and are negative.      Allergies  Iodine and Sulfonamide derivatives  Home Medications   Current Outpatient Rx  Name  Route  Sig  Dispense  Refill  . diazepam (VALIUM) 5 MG tablet   Oral   Take 1 tablet by mouth as needed.         Marland Kitchen estradiol (ESTRACE) 0.1 MG/GM vaginal cream      Use 1/2 g vaginally twice weekly   42.5 g   3   . Flaxseed, Linseed, (FLAX SEED OIL PO)   Oral   Take by mouth.         Marland Kitchen HUMIRA 40  MG/0.8ML injection   Subcutaneous   Inject 40 mg into the skin every 14 (fourteen) days.         . Ibuprofen 200 MG CAPS   Oral   Take 1-2 capsules by mouth daily.         Marland Kitchen lidocaine (LIDODERM) 5 %   Transdermal   Place 1 patch onto the skin as needed.         . Magnesium 250 MG TABS   Oral   Take 4 tablets by mouth daily.         . mometasone-formoterol (DULERA) 100-5 MCG/ACT AERO   Inhalation   Inhale 2 puffs into the lungs daily.         Marland Kitchen omeprazole (PRILOSEC) 40 MG capsule   Oral   Take 2 capsules by mouth daily.         . predniSONE (DELTASONE) 5 MG tablet   Oral   Take 1.5 tablets by mouth daily.         . promethazine (PHENERGAN) 25 MG tablet   Oral   Take 1 tablet by mouth as needed.         . RESTASIS 0.05 % ophthalmic emulsion   Both Eyes   Place 1 drop into both eyes 2 (two) times daily.         . temazepam (RESTORIL) 30 MG capsule   Oral   Take 1 capsule by mouth at bedtime.         . VENTOLIN HFA 108 (90 BASE) MCG/ACT inhaler   Inhalation   Inhale 2 puffs into the lungs  every 4 (four) hours as needed.         . Vitamin D, Ergocalciferol, (DRISDOL) 50000 UNITS CAPS capsule   Oral   Take 1 capsule (50,000 Units total) by mouth once a week.   30 capsule   3    BP 129/62  Pulse 106  Temp(Src) 99.6 F (37.6 C) (Oral)  Resp 18  Ht 5\' 2"  (1.575 m)  Wt 124 lb (56.246 kg)  BMI 22.67 kg/m2  SpO2 95% Physical Exam  Nursing note and vitals reviewed. Constitutional: She is oriented to person, place, and time. She appears well-developed and well-nourished. No distress.  HENT:  Head: Normocephalic and atraumatic.  Eyes: EOM are normal.  Neck: Neck supple. No tracheal deviation present.  Cardiovascular: Normal rate.   Pulmonary/Chest: Effort normal. No respiratory distress.  Musculoskeletal: Normal range of motion.  No midline or paralumbar tenderness. Hip point tender laterally. No sciatica. full ROM lower extremity. Distal pulses intact.  Neurological: She is alert and oriented to person, place, and time.  Skin: Skin is warm and dry.  Psychiatric: She has a normal mood and affect. Her behavior is normal.    ED Course  Procedures (including critical care time) Medications - No data to display  DIAGNOSTIC STUDIES: Oxygen Saturation is 95% on RA, normal by my interpretation.    COORDINATION OF CARE: 10:23 AM- Discussed treatment plan with pt which includes hip x-ray. Pt agrees to plan.    Labs Review Labs Reviewed - No data to display Imaging Review Dg Hip Complete Right  05/02/2013   CLINICAL DATA:  Right hip pain, no injury  EXAM: RIGHT HIP - COMPLETE 2+ VIEW  COMPARISON:  None.  FINDINGS: Normal alignment.  No fracture.  Right hip joint is normal.  1 cm sclerotic lesion in the right femur at the level of the lesser trochanter. This appears to be a  medullary lesion extending to the cortex. This is ill-defined with spiculated margins. No cortical erosion.  IMPRESSION: Negative for fracture  1 cm sclerotic lesion in the proximal right femur.  Differential includes metastatic disease as well as possible benign bone lesion. Further evaluation is suggested. MRI of the right hip without with contrast is suggested.   Electronically Signed   By: Franchot Gallo M.D.   On: 05/02/2013 11:11     EKG Interpretation None      MDM   Final diagnoses:  None    1. Right hip pain  Negative x-rays for fracture. There is a small ill-defined area of bone that follow up MR is recommended for further diagnosis. This was discussed with the patient. Family is here to pick her up, pain is mildly improved (better in shoulder, minimal improvement in hip).   I personally performed the services described in this documentation, which was scribed in my presence. The recorded information has been reviewed and is accurate.     Dewaine Oats, PA-C 05/02/13 1248

## 2013-05-02 NOTE — ED Notes (Signed)
IV start unsuccessful.

## 2013-05-02 NOTE — Discharge Instructions (Signed)
Arthralgia  Arthralgia is joint pain. A joint is a place where two bones meet. Joint pain can happen for many reasons. The joint can be bruised, stiff, infected, or weak from aging. Pain usually goes away after resting and taking medicine for soreness.   HOME CARE  · Rest the joint as told by your doctor.  · Keep the sore joint raised (elevated) for the first 24 hours.  · Put ice on the joint area.  · Put ice in a plastic bag.  · Place a towel between your skin and the bag.  · Leave the ice on for 15-20 minutes, 03-04 times a day.  · Wear your splint, casting, elastic bandage, or sling as told by your doctor.  · Only take medicine as told by your doctor. Do not take aspirin.  · Use crutches as told by your doctor. Do not put weight on the joint until told to by your doctor.  GET HELP RIGHT AWAY IF:   · You have bruising, puffiness (swelling), or more pain.  · Your fingers or toes turn blue or start to lose feeling (numb).  · Your medicine does not lessen the pain.  · Your pain becomes severe.  · You have a temperature by mouth above 102° F (38.9° C), not controlled by medicine.  · You cannot move or use the joint.  MAKE SURE YOU:   · Understand these instructions.  · Will watch your condition.  · Will get help right away if you are not doing well or get worse.  Document Released: 01/20/2009 Document Revised: 04/26/2011 Document Reviewed: 01/20/2009  [employer]® Patient Information ©2014 [employer].

## 2013-05-04 NOTE — ED Provider Notes (Signed)
Medical screening examination/treatment/procedure(s) were performed by non-physician practitioner and as supervising physician I was immediately available for consultation/collaboration.   EKG Interpretation None        Charles B. Karle Starch, MD 05/04/13 [phone]

## 2013-05-11 ENCOUNTER — Other Ambulatory Visit (HOSPITAL_COMMUNITY): Payer: Self-pay | Admitting: Orthopaedic Surgery

## 2013-05-11 DIAGNOSIS — M898X5 Other specified disorders of bone, thigh: Secondary | ICD-10-CM

## 2013-05-14 ENCOUNTER — Other Ambulatory Visit: Payer: Self-pay | Admitting: Orthopaedic Surgery

## 2013-05-14 DIAGNOSIS — M898X5 Other specified disorders of bone, thigh: Secondary | ICD-10-CM

## 2013-05-14 DIAGNOSIS — M25559 Pain in unspecified hip: Secondary | ICD-10-CM

## 2013-05-17 ENCOUNTER — Encounter (HOSPITAL_COMMUNITY)
Admission: RE | Admit: 2013-05-17 | Discharge: 2013-05-17 | Disposition: A | Discharge status: Home / Self Care | Payer: BC Managed Care – PPO | Source: Ambulatory Visit | Attending: Orthopaedic Surgery | Admitting: Orthopaedic Surgery

## 2013-05-17 DIAGNOSIS — M898X5 Other specified disorders of bone, thigh: Secondary | ICD-10-CM

## 2013-05-17 DIAGNOSIS — M948X9 Other specified disorders of cartilage, unspecified sites: Secondary | ICD-10-CM | POA: Insufficient documentation

## 2013-05-17 MED ORDER — TECHNETIUM TC 99M MEDRONATE IV KIT
25.0000 | PACK | Freq: Once | INTRAVENOUS | Status: AC | PRN
  Administered 2013-05-17: 25 via INTRAVENOUS

## 2013-05-18 ENCOUNTER — Ambulatory Visit
Admission: RE | Admit: 2013-05-18 | Discharge: 2013-05-18 | Disposition: A | Discharge status: Home / Self Care | Payer: BC Managed Care – PPO | Source: Ambulatory Visit | Attending: Orthopaedic Surgery | Admitting: Orthopaedic Surgery

## 2013-05-18 DIAGNOSIS — M898X5 Other specified disorders of bone, thigh: Secondary | ICD-10-CM

## 2013-05-18 DIAGNOSIS — M25559 Pain in unspecified hip: Secondary | ICD-10-CM

## 2013-05-18 MED ORDER — GADOBENATE DIMEGLUMINE 529 MG/ML IV SOLN
10.0000 mL | Freq: Once | INTRAVENOUS | Status: AC | PRN
  Administered 2013-05-18: 10 mL via INTRAVENOUS

## 2013-05-28 ENCOUNTER — Telehealth: Payer: Self-pay | Admitting: Nurse Practitioner

## 2013-05-28 DIAGNOSIS — R935 Abnormal findings on diagnostic imaging of other abdominal regions, including retroperitoneum: Secondary | ICD-10-CM

## 2013-05-28 NOTE — Telephone Encounter (Signed)
But the OV for PUS and exam can be done on same day - correct?

## 2013-05-28 NOTE — Telephone Encounter (Signed)
Called patient about her request for PUS given the findings on MRI report done to evaluate pain in the right femur.  She is now on a new med IV for RA and is some better.  The MRI showed a cystic lesion in right adnexa and will need PUS for confirmation of anything.  Orders are placed and we will follow with results.  (She was trying to avoid an OV and another appointment for PUS because of the amount of time that she was out with her hip and hospital admission).

## 2013-05-28 NOTE — Telephone Encounter (Signed)
Patty,  This patient will definitely need a pelvic ultrasound and office examination and consultation. It looks like there could be a large adnexal mass noted. I will be happy to see her.

## 2013-05-28 NOTE — Telephone Encounter (Signed)
Absolutely.

## 2013-05-31 ENCOUNTER — Telehealth: Payer: Self-pay | Admitting: Obstetrics & Gynecology

## 2013-05-31 DIAGNOSIS — N838 Other noninflammatory disorders of ovary, fallopian tube and broad ligament: Secondary | ICD-10-CM

## 2013-05-31 NOTE — Telephone Encounter (Signed)
Spoke with patient. Advised of $10 copay quote received from insurance for PUS. Scheduled PUS. Advised patient of cancellation policy/fee. Mailed the In-Office procedure form that includes appointment date and time, patient copay, and cancellation policy.

## 2013-06-14 ENCOUNTER — Ambulatory Visit (INDEPENDENT_AMBULATORY_CARE_PROVIDER_SITE_OTHER): Payer: BC Managed Care – PPO | Admitting: Obstetrics & Gynecology

## 2013-06-14 ENCOUNTER — Ambulatory Visit (INDEPENDENT_AMBULATORY_CARE_PROVIDER_SITE_OTHER): Payer: BC Managed Care – PPO

## 2013-06-14 ENCOUNTER — Encounter: Payer: Self-pay | Admitting: Obstetrics & Gynecology

## 2013-06-14 VITALS — BP 122/72 | Ht 62.0 in | Wt 121.0 lb

## 2013-06-14 DIAGNOSIS — N838 Other noninflammatory disorders of ovary, fallopian tube and broad ligament: Secondary | ICD-10-CM

## 2013-06-14 DIAGNOSIS — N839 Noninflammatory disorder of ovary, fallopian tube and broad ligament, unspecified: Secondary | ICD-10-CM

## 2013-06-14 NOTE — Progress Notes (Signed)
63 y.o. G3P2 Divorcedfemale here for a pelvic ultrasound after having an incidental finding of a 7cm right adnexal mass noted on pelvic MRI obtained  05/18/13.  Pt having significant hip pain due to rheumatoid arthritis.  Is on Orencia and chronic pain medication and under the care of Dr. Estanislado Pandy.    No LMP recorded. Patient has had a hysterectomy.  Sexually active:  no  Contraception: hysterectomy  FINDINGS: UTERUS: surgically absent EMS: n/a ADNEXA:   Left ovary 2.4 x 0.7 x 0.6cm   Right ovary 7.0 x 6.1 x 6.5cm.  No clear ovarian tissue seen.  Cystic lesions is avascular and simple in appearance.   CUL DE SAC: no free fluid  Images reviewed.  This is most likely a serous or mucinous cystadenoma versus a large paratubal cyst.  Either way, it should probably be removed.  D/w pt removal of both ovaries if this is being done.  She in in agreement with this.    Procedure discussed with patient.  Hospital stay, recovery and pain management all discussed.  Risks discussed including but not limited to bleeding, 1% risk of receiving a  transfusion, infection, 3-4% risk of bowel/bladder/ureteral/vascular injury discussed as well as pos need for additional surgery if injury does occur discussedsible.  DVT/PE and rare risk of death discussed.  My actual complications with prior surgeries discussed.  Hernia formation discussed.  Positioning and incision locations discussed.  Patient aware if pathology abnormal she may need additional treatment.  All questions answered.    Assessment:  7cm cystic right ovarian cyst, most likely benign Plan: ca-125 Will plan laparoscopic BSO Will need to check with Dr. Estanislado Pandy about surgery in relation to her Orencia and also indicate possible need for increased pain meds for a short while post-operatively.  CC per pt request:  Deveshwar, Juanita Craver  ~25 minutes spent with patient >50% of time was in face to face discussion of above.

## 2013-06-15 LAB — CA 125: CA 125: 3.7 U/mL (ref 0.0–30.2)

## 2013-06-19 ENCOUNTER — Telehealth: Payer: Self-pay | Admitting: Obstetrics & Gynecology

## 2013-06-19 NOTE — Telephone Encounter (Signed)
Spoke with patient. Advised that per benefits quote received, she will be responsible for approximately $332.03 for surgeons charges. Payment is to be received 2 weeks prior to scheduled surgery date.Patient agreeable.

## 2013-06-25 NOTE — Telephone Encounter (Signed)
Patient calling Gay Filler to schedule surgery.

## 2013-06-29 NOTE — Telephone Encounter (Signed)
Patient is asking to talk with sally regarding surgery.

## 2013-07-06 NOTE — Telephone Encounter (Signed)
Yes.  Taking that pain medication is fine.  Thanks.

## 2013-07-06 NOTE — Telephone Encounter (Signed)
Call to patient. Advised surgery is scheduled for Monday 07-30-13 at 1000 at [facility]. Surgery instruction sheet reviewed and mailed.  Pre-op scheduled for Mon 07-23-13 at 4pm. Will call her is any ability to move this up.  Patient asking if she is to have another ultrasound prior to surgery?   Patient takes Hydrocodone for pain due to RA. Ok to continue prior to surgery?

## 2013-07-06 NOTE — Telephone Encounter (Signed)
Call to pateint regarding surgery date preferences.  Due to Orencia infusion, she states she cant have procedure any earlier than 07-30-13.  She is also available the following week.  Will submit case request and call her back.

## 2013-07-10 NOTE — Telephone Encounter (Signed)
No repeat PUS needed.  This looks benign but is 7cm.  Thanks.

## 2013-07-10 NOTE — Telephone Encounter (Signed)
Patient notified of response from Dr Sabra Heck regarding pain med and no further PUS at this time.  Encounter closed.

## 2013-07-10 NOTE — Telephone Encounter (Signed)
Do I need to schedule another PUS prior to surgery?  Last PUS 06-14-13.

## 2013-07-23 ENCOUNTER — Encounter (HOSPITAL_COMMUNITY)
Admission: RE | Admit: 2013-07-23 | Discharge: 2013-07-23 | Disposition: A | Discharge status: Home / Self Care | Payer: BC Managed Care – PPO | Source: Ambulatory Visit | Attending: Obstetrics & Gynecology | Admitting: Obstetrics & Gynecology

## 2013-07-23 ENCOUNTER — Encounter (HOSPITAL_COMMUNITY): Payer: Self-pay

## 2013-07-23 ENCOUNTER — Ambulatory Visit (INDEPENDENT_AMBULATORY_CARE_PROVIDER_SITE_OTHER): Payer: BC Managed Care – PPO | Admitting: Obstetrics & Gynecology

## 2013-07-23 VITALS — BP 98/58 | HR 56 | Resp 12 | Ht 62.75 in | Wt 122.8 lb

## 2013-07-23 DIAGNOSIS — Z01812 Encounter for preprocedural laboratory examination: Secondary | ICD-10-CM | POA: Insufficient documentation

## 2013-07-23 DIAGNOSIS — Z01810 Encounter for preprocedural cardiovascular examination: Secondary | ICD-10-CM | POA: Insufficient documentation

## 2013-07-23 DIAGNOSIS — N83209 Unspecified ovarian cyst, unspecified side: Secondary | ICD-10-CM

## 2013-07-23 HISTORY — DX: Other specified postprocedural states: R11.2

## 2013-07-23 HISTORY — DX: Other specified postprocedural states: Z98.890

## 2013-07-23 HISTORY — DX: Unspecified asthma, uncomplicated: J45.909

## 2013-07-23 LAB — CBC
HCT: 35.2 % — ABNORMAL LOW (ref 36.0–46.0)
HEMOGLOBIN: 11.8 g/dL — AB (ref 12.0–15.0)
MCH: 26.9 pg (ref 26.0–34.0)
MCHC: 33.5 g/dL (ref 30.0–36.0)
MCV: 80.2 fL (ref 78.0–100.0)
Platelets: 379 10*3/uL (ref 150–400)
RBC: 4.39 MIL/uL (ref 3.87–5.11)
RDW: 14.1 % (ref 11.5–15.5)
WBC: 9.7 10*3/uL (ref 4.0–10.5)

## 2013-07-23 MED ORDER — OXYCODONE-ACETAMINOPHEN 5-325 MG PO TABS: 2.0000 | ORAL_TABLET | ORAL | Status: DC | PRN

## 2013-07-23 MED ORDER — OXYCODONE-ACETAMINOPHEN 5-325 MG PO TABS: 1.0000 | ORAL_TABLET | ORAL | Status: DC | PRN

## 2013-07-23 NOTE — Patient Instructions (Signed)
Your procedure is scheduled on:07/30/13  Enter through the Main Entrance at :0830 am Pick up desk phone and dial [phone] and inform us of your arrival.  Please call [phone] if you have any problems the morning of surgery.  Remember: Do not eat food or drink liquids, including water, after midnight:Sunday    You may brush your teeth the morning of surgery.  Take these meds the morning of surgery with a sip of water: Omeprazole  DO NOT wear jewelry, eye make-up, lipstick,body lotion, or dark fingernail polish.  (Polished toes are ok) You may wear deodorant.  If you are to be admitted after surgery, leave suitcase in car until your room has been assigned. Patients discharged on the day of surgery will not be allowed to drive home. Wear loose fitting, comfortable clothes for your ride home.

## 2013-07-23 NOTE — Progress Notes (Signed)
63 y.o. [id number] DivorcedCaucasian female here for discussion of upcoming procedure.  Laparoscopic bilateral salpingo oophorectomy planned due to enlarged 7cm right ovary that was discovered on pelvic MRI done to evaluate lytic lesion in femur, now thought to be a bony island.  Pt came for PUS here, in office, 06/14/13 showing a simple right ovarian cyst measuring 7 x 6 x 6.5.  She does not have pain, so much as just some pressure on her bladder with certain movements or positions.  No dysuria.  Does have a sensation of pressure as well.  Ca-125 was negative in the end of April as well.  Pt on immunosuppressive.  Maureen Chatters is being held until after surgery and her post operative follow up appt.  Dr. Estanislado Pandy will need a "release" when I feel the healing is progressed enough to resume the [place].    Procedure reviewed with pt.  Procedure discussed with patient.  Hospital stay, recovery and pain management all discussed.  Risks discussed including but not limited to bleeding, 1% risk of receiving a  transfusion, infection, 3-4% risk of bowel/bladder/ureteral/vascular injury discussed as well as possible need for additional surgery if injury does occur discussed.  DVT/PE and rare risk of death discussed.  My actual complications with prior surgeries discussed.   Hernia formation discussed.  Positioning and incision locations discussed.  Patient aware if pathology abnormal she may need additional treatment.  All questions answered.    Ob Hx:   No LMP recorded. Patient has had a hysterectomy.          Sexually active: no Birth control: PMP Last pap: 05/20/09 WNL Last MMG: 08/15/12 3D-normal Tobacco: none  Past Surgical History  Procedure Laterality Date  . Total vaginal hysterectomy  1989    with AP repair  . Colporrhaphy    . Breast surgery Left 2003    Lumpectomy T1C1, NO and negative ER/ PR  . Pelvic floor repair  3/04    SPARC with AP colporrhaphy  . Colonoscopy  10/04  . Colonoscopy  11/10   normal recheck in 5 years  . Esophagogastroduodenoscopy endoscopy  11/10    GERD  . Foot surgery Left age 19    removal of pseudo rheumatoid nodules from left forefoot.    Past Medical History  Diagnosis Date  . Cancer     breast, left  . Rheumatoid arthritis     on Humira  . Chiari malformation   . Asthma     inhaler one x per day  . PONV (postoperative nausea and vomiting)     Allergies: Iodine and Sulfonamide derivatives  Current Outpatient Prescriptions  Medication Sig Dispense Refill  . Abatacept (ORENCIA IV) Inject 750 mg into the vein. Once monthly      . diclofenac sodium (VOLTAREN) 1 % GEL Apply 2 g topically 4 (four) times daily.      Marland Kitchen estradiol (ESTRACE) 0.1 MG/GM vaginal cream Place 0.5 g vaginally once a week. Use 1/2 g vaginally twice weekly      . HYDROcodone-acetaminophen (NORCO/VICODIN) 5-325 MG per tablet Take 0.5 tablets by mouth every 4 (four) hours as needed for moderate pain.      Marland Kitchen lidocaine (LIDODERM) 5 % Place 1 patch onto the skin daily as needed (for shoulder pain).       . Magnesium 250 MG TABS Take 4 tablets by mouth daily.      . mometasone-formoterol (DULERA) 100-5 MCG/ACT AERO Inhale 1 puff into the lungs daily.       Marland Kitchen  omeprazole (PRILOSEC) 40 MG capsule Take 40 mg by mouth 2 (two) times daily.       . promethazine (PHENERGAN) 25 MG tablet Take 25 mg by mouth every 6 (six) hours as needed for nausea or vomiting.      . RESTASIS 0.05 % ophthalmic emulsion Place 1 drop into both eyes 2 (two) times daily.      . temazepam (RESTORIL) 30 MG capsule Take 1 capsule by mouth at bedtime.      . VENTOLIN HFA 108 (90 BASE) MCG/ACT inhaler Inhale 2 puffs into the lungs every 4 (four) hours as needed.      . Vitamin D, Ergocalciferol, (DRISDOL) 50000 UNITS CAPS capsule Take 50,000 Units by mouth once a week. Alternating with 1500mg  OTC      . ibuprofen (ADVIL,MOTRIN) 200 MG tablet Take 400 mg by mouth every 6 (six) hours as needed (for inflammation).      .  predniSONE (DELTASONE) 10 MG tablet        No current facility-administered medications for this visit.    ROS: A comprehensive review of systems was negative.  Exam:    BP 98/58  Pulse 56  Resp 12  Ht 5' 2.75" (1.594 m)  Wt 122 lb 12.8 oz (55.702 kg)  BMI 21.92 kg/m2  General appearance: alert and cooperative Head: Normocephalic, without obvious abnormality, atraumatic Neck: no adenopathy, supple, symmetrical, trachea midline and thyroid not enlarged, symmetric, no tenderness/mass/nodules Lungs: clear to auscultation bilaterally Heart: regular rate and rhythm, S1, S2 normal, no murmur, click, rub or gallop Abdomen: soft, non-tender; bowel sounds normal; no masses,  no organomegaly Extremities: extremities normal, atraumatic, no cyanosis or edema Skin: Skin color, texture, turgor normal. No rashes or lesions Lymph nodes: Cervical, supraclavicular, and axillary nodes normal. no inguinal nodes palpated Neurologic: Grossly normal  Pelvic: External genitalia:  no lesions              Urethra: normal appearing urethra with no masses, tenderness or lesions              Bartholins and Skenes: normal                 Vagina: normal appearing vagina with normal color and discharge, no lesions              Cervix: absent              Pap taken: no        Bimanual Exam:  Uterus:  uterus is normal size, shape, consistency and nontender, absent                                      Adnexa:    normal adnexa in size, nontender and no masses and I cannot palpate mass                                      Rectovaginal: Deferred                                      Anus:  normal sphincter tone, no lesions  A: 7cm, simple appearing right adnexal mass, normal ca-125    P:  Laparoscopic bilateral salpingoophrectomy planned Rx for Percocet given. Medications/Vitamins reviewed.  Pt has nothing that she needs to stop. No iodine due to allergy, although pt states she is fine with topical iodine. No  hyperflexion of neck due to Chiari malformation. Reviewed with anesthesiology pt's EKG and PAC's-informed no issues with surgery per Dr. Lyndle Herrlich.  Pt aware.  ~25 minutes spent with patient >50% of time was in face to face discussion of above.

## 2013-07-25 ENCOUNTER — Encounter: Payer: Self-pay | Admitting: Obstetrics & Gynecology

## 2013-07-29 MED ORDER — DEXTROSE 5 % IV SOLN
2.0000 g | INTRAVENOUS | Status: AC
  Administered 2013-07-30: 2 g via INTRAVENOUS
  Filled 2013-07-29: qty 2

## 2013-07-29 NOTE — H&P (Addendum)
Kristy Perez is an 63 y.o. female [phone] DWF here for bilateral salpingo-ophrectomy due to 7cm smooth right ovarian/adnexal cyst that was noted on pelvic MRI done to evaluate a lesion that was ultimately a "bony island".  Pt is having some urinary pressure with this cyst.  It is difficult to feel on exam, however.  Her PMHx is significant for RA and she is on Orencia.  She has recently see Dr. Estanislado Pandy who recommended she be off the Orencia around the time of surgery until I have cleared her.  She did not receive an injection over the past three weeks.    Pt see in the office on 07/23/13.  Risks and benefits discussed then.  All questions answered.  Pt here and ready to proceed.  Pertinent Gynecological History: Menses: post-menopausal Bleeding: none Contraception: status post hysterectomy DES exposure: denies Blood transfusions: none Sexually transmitted diseases: no past history Previous GYN Procedures: TVH  Last mammogram: normal Date: 08/15/12 Last pap: not indicated due to hysterectomy OB History: G3, P2   Menstrual History: No LMP recorded. Patient has had a hysterectomy.    Past Medical History  Diagnosis Date  . Cancer     breast, left  . Rheumatoid arthritis     on Humira  . Chiari malformation   . Asthma     inhaler one x per day  . PONV (postoperative nausea and vomiting)     Past Surgical History  Procedure Laterality Date  . Total vaginal hysterectomy  1989    with AP repair  . Colporrhaphy    . Breast surgery Left 2003    Lumpectomy T1C1, NO and negative ER/ PR  . Pelvic floor repair  3/04    SPARC with AP colporrhaphy  . Colonoscopy  10/04  . Colonoscopy  11/10    normal recheck in 5 years  . Esophagogastroduodenoscopy endoscopy  11/10    GERD  . Foot surgery Left age 51    removal of pseudo rheumatoid nodules from left forefoot.    Family History  Problem Relation Age of Onset  . Diabetes Mother   . Heart failure Mother   . Bladder Cancer Mother    . Stroke Mother   . Uterine cancer Maternal Aunt   . Bladder Cancer Maternal Grandmother   . Colon cancer Maternal Grandmother   . Cancer Father     head and neck    Social History:  reports that she quit smoking about 14 years ago. Her smoking use included Cigarettes. She smoked 0.00 packs per day. She has never used smokeless tobacco. She reports that she does not drink alcohol. Her drug history is not on file.  Allergies:  Allergies  Allergen Reactions  . Iodine Anaphylaxis  . Sulfonamide Derivatives Other (See Comments)    Tongue turns black.    No prescriptions prior to admission    Review of Systems  Constitutional: Negative.   HENT: Negative.   Respiratory: Negative.   Cardiovascular: Negative.   Gastrointestinal: Negative.   Genitourinary: Positive for urgency and frequency. Negative for dysuria, hematuria and flank pain.  Skin: Negative.   Neurological: Negative.   Psychiatric/Behavioral: Negative.     There were no vitals taken for this visit. Physical Exam  Constitutional: She is oriented to person, place, and time. She appears well-developed and well-nourished.  HENT:  Head: Normocephalic and atraumatic.  Neck: Normal range of motion. Neck supple.  Cardiovascular: Normal rate and regular rhythm.   Respiratory: Effort normal and breath  sounds normal.  GI: Soft. Bowel sounds are normal.  Neurological: She is alert and oriented to person, place, and time.  Skin: Skin is warm and dry.  Psychiatric: She has a normal mood and affect.    No results found for this or any previous visit (from the past 24 hour(s)).  No results found.  Assessment/Plan: 63 yo G3P2 DWF with 7cm right simple appearing adnexal mass with normal Ca-125 here for laparoscopic removal of both tubes and ovaries.  Hale Bogus SUZANNE 07/29/2013, 11:42 PM

## 2013-07-30 ENCOUNTER — Encounter (HOSPITAL_COMMUNITY): Payer: BC Managed Care – PPO | Admitting: Certified Registered Nurse Anesthetist

## 2013-07-30 ENCOUNTER — Ambulatory Visit (HOSPITAL_COMMUNITY): Payer: BC Managed Care – PPO | Admitting: Certified Registered Nurse Anesthetist

## 2013-07-30 ENCOUNTER — Encounter (HOSPITAL_COMMUNITY)
Admission: RE | Disposition: A | Discharge status: Home / Self Care | Payer: Self-pay | Source: Ambulatory Visit | Attending: Obstetrics & Gynecology

## 2013-07-30 ENCOUNTER — Ambulatory Visit (HOSPITAL_COMMUNITY)
Admission: RE | Admit: 2013-07-30 | Discharge: 2013-07-30 | Disposition: A | Discharge status: Home / Self Care | Payer: BC Managed Care – PPO | Source: Ambulatory Visit | Attending: Obstetrics & Gynecology | Admitting: Obstetrics & Gynecology

## 2013-07-30 ENCOUNTER — Encounter (HOSPITAL_COMMUNITY): Payer: Self-pay | Admitting: Anesthesiology

## 2013-07-30 DIAGNOSIS — D279 Benign neoplasm of unspecified ovary: Secondary | ICD-10-CM

## 2013-07-30 DIAGNOSIS — J45909 Unspecified asthma, uncomplicated: Secondary | ICD-10-CM | POA: Insufficient documentation

## 2013-07-30 DIAGNOSIS — L723 Sebaceous cyst: Secondary | ICD-10-CM | POA: Insufficient documentation

## 2013-07-30 DIAGNOSIS — N838 Other noninflammatory disorders of ovary, fallopian tube and broad ligament: Secondary | ICD-10-CM | POA: Insufficient documentation

## 2013-07-30 DIAGNOSIS — Z87891 Personal history of nicotine dependence: Secondary | ICD-10-CM | POA: Insufficient documentation

## 2013-07-30 HISTORY — PX: LAPAROSCOPIC BILATERAL SALPINGO OOPHERECTOMY: SHX5890

## 2013-07-30 SURGERY — SALPINGO-OOPHORECTOMY, BILATERAL, LAPAROSCOPIC
Anesthesia: General | Site: Abdomen

## 2013-07-30 MED ORDER — HEPARIN SODIUM (PORCINE) 5000 UNIT/ML IJ SOLN
INTRAMUSCULAR | Status: AC
  Filled 2013-07-30: qty 1

## 2013-07-30 MED ORDER — DEXAMETHASONE SODIUM PHOSPHATE 10 MG/ML IJ SOLN
INTRAMUSCULAR | Status: DC | PRN
  Administered 2013-07-30: 10 mg via INTRAVENOUS

## 2013-07-30 MED ORDER — ONDANSETRON HCL 4 MG/2ML IJ SOLN
INTRAMUSCULAR | Status: AC
  Filled 2013-07-30: qty 2

## 2013-07-30 MED ORDER — PROPOFOL 10 MG/ML IV BOLUS
INTRAVENOUS | Status: DC | PRN
  Administered 2013-07-30: 50 mg via INTRAVENOUS
  Administered 2013-07-30: 150 mg via INTRAVENOUS

## 2013-07-30 MED ORDER — MEPERIDINE HCL 25 MG/ML IJ SOLN: 6.2500 mg | INTRAMUSCULAR | Status: DC | PRN

## 2013-07-30 MED ORDER — PROPOFOL 10 MG/ML IV EMUL
INTRAVENOUS | Status: AC
  Filled 2013-07-30: qty 20

## 2013-07-30 MED ORDER — FENTANYL CITRATE 0.05 MG/ML IJ SOLN
INTRAMUSCULAR | Status: DC | PRN
  Administered 2013-07-30: 50 ug via INTRAVENOUS
  Administered 2013-07-30 (×2): 100 ug via INTRAVENOUS

## 2013-07-30 MED ORDER — KETOROLAC TROMETHAMINE 30 MG/ML IJ SOLN: 15.0000 mg | Freq: Once | INTRAMUSCULAR | Status: DC | PRN

## 2013-07-30 MED ORDER — FENTANYL CITRATE 0.05 MG/ML IJ SOLN
INTRAMUSCULAR | Status: AC
Start: 2013-07-30 — End: 2013-07-30
  Administered 2013-07-30: 50 ug
  Filled 2013-07-30: qty 2

## 2013-07-30 MED ORDER — MIDAZOLAM HCL 2 MG/2ML IJ SOLN
INTRAMUSCULAR | Status: AC
  Filled 2013-07-30: qty 2

## 2013-07-30 MED ORDER — LIDOCAINE HCL (CARDIAC) 20 MG/ML IV SOLN
INTRAVENOUS | Status: DC | PRN
  Administered 2013-07-30: 40 mg via INTRAVENOUS

## 2013-07-30 MED ORDER — KETOROLAC TROMETHAMINE 30 MG/ML IJ SOLN
INTRAMUSCULAR | Status: DC | PRN
  Administered 2013-07-30: 30 mg via INTRAVENOUS

## 2013-07-30 MED ORDER — PROPOFOL 10 MG/ML IV EMUL
INTRAVENOUS | Status: DC | PRN
  Administered 2013-07-30: 100 ug/kg/min via INTRAVENOUS

## 2013-07-30 MED ORDER — LACTATED RINGERS IV SOLN
INTRAVENOUS | Status: DC
  Administered 2013-07-30 (×2): via INTRAVENOUS

## 2013-07-30 MED ORDER — PROMETHAZINE HCL 25 MG/ML IJ SOLN: 6.2500 mg | INTRAMUSCULAR | Status: DC | PRN

## 2013-07-30 MED ORDER — BUPIVACAINE HCL (PF) 0.25 % IJ SOLN
INTRAMUSCULAR | Status: DC | PRN
  Administered 2013-07-30: 8 mL

## 2013-07-30 MED ORDER — KETOROLAC TROMETHAMINE 30 MG/ML IJ SOLN
INTRAMUSCULAR | Status: AC
Start: 2013-07-30 — End: 2013-07-30
  Filled 2013-07-30: qty 1

## 2013-07-30 MED ORDER — DEXAMETHASONE SODIUM PHOSPHATE 10 MG/ML IJ SOLN
INTRAMUSCULAR | Status: AC
  Filled 2013-07-30: qty 1

## 2013-07-30 MED ORDER — FENTANYL CITRATE 0.05 MG/ML IJ SOLN
INTRAMUSCULAR | Status: AC
  Filled 2013-07-30: qty 5

## 2013-07-30 MED ORDER — NEOSTIGMINE METHYLSULFATE 10 MG/10ML IV SOLN
INTRAVENOUS | Status: AC
  Filled 2013-07-30: qty 1

## 2013-07-30 MED ORDER — LACTATED RINGERS IR SOLN
Status: DC | PRN
  Administered 2013-07-30: 3000 mL

## 2013-07-30 MED ORDER — MIDAZOLAM HCL 2 MG/2ML IJ SOLN: 0.5000 mg | Freq: Once | INTRAMUSCULAR | Status: DC | PRN

## 2013-07-30 MED ORDER — BUPIVACAINE HCL (PF) 0.25 % IJ SOLN
INTRAMUSCULAR | Status: AC
  Filled 2013-07-30: qty 30

## 2013-07-30 MED ORDER — ROCURONIUM BROMIDE 100 MG/10ML IV SOLN
INTRAVENOUS | Status: DC | PRN
  Administered 2013-07-30: 30 mg via INTRAVENOUS
  Administered 2013-07-30 (×2): 10 mg via INTRAVENOUS

## 2013-07-30 MED ORDER — HYDROMORPHONE HCL PF 1 MG/ML IJ SOLN: 0.2500 mg | INTRAMUSCULAR | Status: DC | PRN

## 2013-07-30 MED ORDER — GLYCOPYRROLATE 0.2 MG/ML IJ SOLN
INTRAMUSCULAR | Status: DC | PRN
  Administered 2013-07-30: 0.4 mg via INTRAVENOUS

## 2013-07-30 MED ORDER — ONDANSETRON HCL 4 MG/2ML IJ SOLN
INTRAMUSCULAR | Status: DC | PRN
  Administered 2013-07-30: 4 mg via INTRAVENOUS

## 2013-07-30 MED ORDER — LIDOCAINE HCL (CARDIAC) 20 MG/ML IV SOLN
INTRAVENOUS | Status: AC
  Filled 2013-07-30: qty 5

## 2013-07-30 MED ORDER — MIDAZOLAM HCL 5 MG/5ML IJ SOLN
INTRAMUSCULAR | Status: DC | PRN
  Administered 2013-07-30: 2 mg via INTRAVENOUS

## 2013-07-30 MED ORDER — GLYCOPYRROLATE 0.2 MG/ML IJ SOLN
INTRAMUSCULAR | Status: AC
  Filled 2013-07-30: qty 4

## 2013-07-30 MED ORDER — NEOSTIGMINE METHYLSULFATE 10 MG/10ML IV SOLN
INTRAVENOUS | Status: DC | PRN
  Administered 2013-07-30: 2 mg via INTRAVENOUS

## 2013-07-30 SURGICAL SUPPLY — 36 items
ADH SKN CLS APL DERMABOND .7 (GAUZE/BANDAGES/DRESSINGS) ×1
APL SKNCLS STERI-STRIP NONHPOA (GAUZE/BANDAGES/DRESSINGS)
BAG SPEC RTRVL LRG 6X4 10 (ENDOMECHANICALS) ×1
BENZOIN TINCTURE PRP APPL 2/3 (GAUZE/BANDAGES/DRESSINGS) IMPLANT
CABLE HIGH FREQUENCY MONO STRZ (ELECTRODE) ×2 IMPLANT
CATH ROBINSON RED A/P 16FR (CATHETERS) ×1 IMPLANT
CHLORAPREP W/TINT 26ML (MISCELLANEOUS) ×2 IMPLANT
CLOTH BEACON ORANGE TIMEOUT ST (SAFETY) ×2 IMPLANT
DERMABOND ADVANCED (GAUZE/BANDAGES/DRESSINGS) ×1
DERMABOND ADVANCED .7 DNX12 (GAUZE/BANDAGES/DRESSINGS) IMPLANT
DRSG COVADERM PLUS 2X2 (GAUZE/BANDAGES/DRESSINGS) ×4 IMPLANT
DRSG OPSITE POSTOP 3X4 (GAUZE/BANDAGES/DRESSINGS) IMPLANT
FORCEPS CUTTING 33CM 5MM (CUTTING FORCEPS) ×1 IMPLANT
GLOVE BIOGEL PI IND STRL 7.0 (GLOVE) ×2 IMPLANT
GLOVE BIOGEL PI INDICATOR 7.0 (GLOVE) ×2
GLOVE ECLIPSE 6.5 STRL STRAW (GLOVE) ×4 IMPLANT
GOWN STRL REUS W/TWL LRG LVL3 (GOWN DISPOSABLE) ×2 IMPLANT
NEEDLE INSUFFLATION 120MM (ENDOMECHANICALS) ×2 IMPLANT
PACK LAPAROSCOPY BASIN (CUSTOM PROCEDURE TRAY) ×2 IMPLANT
POUCH SPECIMEN RETRIEVAL 10MM (ENDOMECHANICALS) ×1 IMPLANT
PROTECTOR NERVE ULNAR (MISCELLANEOUS) ×2 IMPLANT
SEALER TISSUE G2 CVD JAW 35 (ENDOMECHANICALS) IMPLANT
SEALER TISSUE G2 CVD JAW 45CM (ENDOMECHANICALS)
SET IRRIG TUBING LAPAROSCOPIC (IRRIGATION / IRRIGATOR) IMPLANT
STRIP CLOSURE SKIN 1/2X4 (GAUZE/BANDAGES/DRESSINGS) IMPLANT
STRIP CLOSURE SKIN 1/4X4 (GAUZE/BANDAGES/DRESSINGS) ×2 IMPLANT
SUT VICRYL 0 UR6 27IN ABS (SUTURE) ×1 IMPLANT
SUT VICRYL 4-0 PS2 18IN ABS (SUTURE) ×2 IMPLANT
SYR 30ML LL (SYRINGE) IMPLANT
TOWEL OR 17X24 6PK STRL BLUE (TOWEL DISPOSABLE) ×4 IMPLANT
TRAY FOLEY CATH 14FR (SET/KITS/TRAYS/PACK) ×2 IMPLANT
TROCAR BALLN 12MMX100 BLUNT (TROCAR) IMPLANT
TROCAR XCEL NON-BLD 11X100MML (ENDOMECHANICALS) ×2 IMPLANT
TROCAR XCEL NON-BLD 5MMX100MML (ENDOMECHANICALS) ×2 IMPLANT
WARMER LAPAROSCOPE (MISCELLANEOUS) ×2 IMPLANT
WATER STERILE IRR 1000ML POUR (IV SOLUTION) ×2 IMPLANT

## 2013-07-30 NOTE — Op Note (Signed)
07/30/2013  12:14 PM  PATIENT:  Kristy Perez  63 y.o. female  PRE-OPERATIVE DIAGNOSIS:  7 cm right ovarian mass, H/O RA  POST-OPERATIVE DIAGNOSIS:  7 cm right ovarian mass, h/o RA  PROCEDURE:  Procedure(s): LAPAROSCOPIC BILATERAL SALPINGO OOPHORECTOMY WITH WASHINGS  SURGEON:  Dorwin Fitzhenry SUZANNE  ASSISTANTS: Josefa Half   ANESTHESIA:   general  ESTIMATED BLOOD LOSS: 10cc  BLOOD ADMINISTERED:none   FLUIDS: 1500ccLR  UOP: 500cc clear UOP  SPECIMEN:  Bilateral tubes and ovaries.  Right ovary marked with suture.  Pelvic washings.  Cyst fluid.  DISPOSITION OF SPECIMEN:  PATHOLOGY  FINDINGS: large, smooth, mobile right ovary.  Normal left ovary.  Minimal pelvic adhesions  DESCRIPTION OF OPERATION: Patient is taken to the operating room. She is placed in the supine position. She is a running IV in place. Informed consent was present on the chart. SCDs on her lower extremities and functioning properly. General endotracheal anesthesia was administered by the anesthesia staff without difficulty. Dr. Dawayne Cirri oversaw case. Care was taken with her anesthesia not to flex the neck and to position her in a way that was comfortable for all joints before her anesthesia was performed.  Legs are placed in the low lithotomy position in [place] before anesthesia was administered as well.  . Her arms were held across her chest with blankets and light taping.   Chlor prep was then used to prep the abdomen and technicare prep was used to prep the inner thighs, perineum and vagina. Once 3 minutes had past the patient was draped in a normal standard fashion. The legs were lifted to the high lithotomy position. A sponge stick was placed in the vagina.  Foley was placed to straight drain.  Clear urine was noted. Legs were lowered to the low lithotomy position and attention was turned the abdomen.   0.25% Marcaine was used anesthetize the skin above the umbilicus. A Veress needle was obtained. The  abdomen was elevated and the needle was passed directly into the abdomen. The peritoneum was felt as a pop as it was passed with the needle. A syringe of normal saline was attached the needle and aspiration was performed. No blood or fluid was noted. Fluid was injected without difficulty and a second aspiration was performed. No fluid or blood or saline was noted. Fluid dripped easily into the needle. Low flow CO2 gas was attached the needle and the pneumoperitoneum was achieved without difficulty. Once 3.5 liters of gas was in the abdomen the Veress needle was removed and a 60mm non bladed trochar and port with optiview tip was passed directly to the abdomen. The laparoscope was then used to confirm intraperitoneal placement. Locations for LLQ and RLQ 51mm ports were chosen with transillumination of the skin. The skin was anesthetized with 0.25% Marcaine. 82mm skin incisions were made and 46mm nonbladed trocars and ports were passed directly into the abdomen. The trochars were removed. The upper abdomen was surveyed. Liver, gall bladder, and stomach edge appeared normal. The patient was placed in mild Trendelenburg. The pelvis was surveyed. The left ovary was atrophic. The right ovary was enlarged but mobile.  It filled the pelvis.  Pelvic washings were initially obtained.    Ureters were noted bilaterally.  Attention was turned to the right side. With the ovary on stretch the right IP ligament was serially clamped, cauterized, and incised with the [place]yrus. Once the IP was incised, some filmy adhesions were incised, freeing this ovary from the pelvic sidewall.  Attention was than turned the left side and in a similar fashion, the left IP ligament was serially clamped, cauterized, and incised. The ovary was free at this point.    The scope was changed to a 72mm scope to be placed through the RLQ incision.  An endo catch bag was placed through the midline port.  The small, left ovary was placed in the bag without  difficulty.  The larger, right ovary, could not be placed in the bag due to size.  Decision was made to drain some fluid from this cyst.  As there were no intraoperative malignant features and a pre-op ca-125 was 3.7, drainage was accomplished with a laparoscopic needle and a 60cc syringe.  Once 120 cc straw colored fluid was withdrawn, the ovary fit very easily in the pouch.  There was no spill as the ovary was help up in one location when the fluid was drained and then keeping the grasper on the ovary at the same location, the ovary was placed in the bag.  The bag was delivered through the umbilicus after removal of the midline port.  The ovary needed to be decompressed some more with a Nazhat suction irrigation until the ovary was much small and decompressed.  Both ovaries were delivered in the bag through the incision.  Again, no spill of contents was noted except for a small amount of fluid on the skin.  The right ovary was tagged with suture.  The midline port was replaced.  Pneumoperitoneum was re achieved. Pelvis was irrigated.  No bleeding was noted.  Pedicles were hemostatic.  Procedure was ended.    The instruments were removed. The two inferior ports were removed. No bleeding was noted. Patient was taken out of Trendelenburg. Pt was in [place] a total of 26 minutes.  The pneumoperitoneum was relieved. The midline port was removed and closed at the fascial level with a figure of eight suture of #0 Vicryl. The skin was closed with a subcuticular stitch of #3.0 Vicryl. Dermabond was applied to each incision.   The foley catheter and vaginal sponge stick were removed.  Pt was back in supine position at this point.  Sponge, lap, needle, and instrument counts were correct x 2.  Pt tolerated procedure well and was taken to the recovery room in stable condition.   COUNTS:  YES and correct x 2  PLAN OF CARE: Transfer to PACU

## 2013-07-30 NOTE — Discharge Instructions (Signed)
DISCHARGE INSTRUCTIONS: Laparoscopy   MAY TAKE IBUPROFEN (MOTRIN, ADVIL) OR ALEVE AFTER 5:45 PM FOR CRAMPS!!!  The following instructions have been prepared to help you care for yourself upon your return home today.  Wound care:  Do not get the incision wet for the first 24 hours. The incision should be kept clean and dry.  The Band-Aids or dressings may be removed the day after surgery.  Should the incision become sore, red, and swollen after the first week, check with your doctor.  Personal hygiene:  Shower the day after your procedure.  Activity and limitations:  Do NOT drive or operate any equipment today.  Do NOT lift anything more than 15 pounds for 2-3 weeks after surgery.  Do NOT rest in bed all day.  Walking is encouraged. Walk each day, starting slowly with 5-minute walks 3 or 4 times a day. Slowly increase the length of your walks.  Walk up and down stairs slowly.  Do NOT do strenuous activities, such as golfing, playing tennis, bowling, running, biking, weight lifting, gardening, mowing, or vacuuming for 2-4 weeks. Ask your doctor when it is okay to start.  Diet: Eat a light meal as desired this evening. You may resume your usual diet tomorrow.  Return to work: This is dependent on the type of work you do. For the most part you can return to a desk job within a week of surgery. If you are more active at work, please discuss this with your doctor.  What to expect after your surgery: You may have a slight burning sensation when you urinate on the first day. You may have a very small amount of blood in the urine. Expect to have a small amount of vaginal discharge/light bleeding for 1-2 weeks. It is not unusual to have abdominal soreness and bruising for up to 2 weeks. You may be tired and need more rest for about 1 week. You may experience shoulder pain for 24-72 hours. Lying flat in bed may relieve it.  Call your doctor for any of the following:  Develop a fever of  100.4 or greater  Inability to urinate 6 hours after discharge from hospital  Severe pain not relieved by pain medications  Persistent of heavy bleeding at incision site  Redness or swelling around incision site after a week  Increasing nausea or vomiting  Patient Signature________________________________________ Nurse Signature_________________________________________

## 2013-07-30 NOTE — Transfer of Care (Signed)
Immediate Anesthesia Transfer of Care Note  [name]  Procedure(s) Performed: Procedure(s): LAPAROSCOPIC BILATERAL SALPINGO OOPHORECTOMY WITH WASHINGS (N/A)  Patient Location: PACU  Anesthesia Type:General  Level of Consciousness: sedated  Airway & Oxygen Therapy: Patient Spontanous Breathing and Patient connected to nasal cannula oxygen  Post-op Assessment: Report given to PACU RN and Post -op Vital signs reviewed and stable  Post vital signs: stable  Complications: No apparent anesthesia complications

## 2013-07-30 NOTE — Anesthesia Preprocedure Evaluation (Addendum)
Anesthesia Evaluation  Patient identified by MRN, date of birth, ID band Patient awake    Reviewed: Allergy & Precautions, H&P , Patient's Chart, lab work & pertinent test results, reviewed documented beta blocker date and time   History of Anesthesia Complications (+) PONV and history of anesthetic complications  Airway Mallampati: II TM Distance: >3 FB Neck ROM: full    Dental   Pulmonary asthma , former smoker,  breath sounds clear to auscultation        Cardiovascular Exercise Tolerance: Good Rhythm:regular Rate:Normal     Neuro/Psych    GI/Hepatic   Endo/Other    Renal/GU      Musculoskeletal  (+) Arthritis -,   Abdominal   Peds  Hematology   Anesthesia Other Findings Chiari malformation Rheumatoid arthritis  Reproductive/Obstetrics                        Anesthesia Physical Anesthesia Plan  ASA: III  Anesthesia Plan: General ETT   Post-op Pain Management:    Induction:   Airway Management Planned: Video Laryngoscope Planned and Fiberoptic Intubation Planned  Additional Equipment:   Intra-op Plan:   Post-operative Plan:   Informed Consent: I have reviewed the patients History and Physical, chart, labs and discussed the procedure including the risks, benefits and alternatives for the proposed anesthesia with the patient or authorized representative who has indicated his/her understanding and acceptance.   Dental Advisory Given  Plan Discussed with: CRNA and Surgeon  Anesthesia Plan Comments:       chiari with tonsillar involvement per MRI. RA with AO instability... Will position awake and use minimal trendelenberg Anesthesia Quick Evaluation

## 2013-07-31 ENCOUNTER — Encounter (HOSPITAL_COMMUNITY): Payer: Self-pay | Admitting: Obstetrics & Gynecology

## 2013-07-31 MED FILL — Heparin Sodium (Porcine) Inj 5000 Unit/ML: INTRAMUSCULAR | Qty: 2 | Status: AC

## 2013-08-02 NOTE — Anesthesia Postprocedure Evaluation (Signed)
  Anesthesia Post Note  [name]  Procedure(s) Performed: Procedure(s) (LRB): LAPAROSCOPIC BILATERAL SALPINGO OOPHORECTOMY WITH WASHINGS (N/A)  Anesthesia type: GA  Patient location: PACU  Post pain: Pain level controlled  Post assessment: Post-op Vital signs reviewed  Last Vitals:  Filed Vitals:   07/30/13 1245  BP: 98/46  Pulse: 55  Temp: 36.6 C  Resp: 18    Post vital signs: Reviewed  Level of consciousness: sedated  Complications: No apparent anesthesia complications

## 2013-08-14 ENCOUNTER — Ambulatory Visit (INDEPENDENT_AMBULATORY_CARE_PROVIDER_SITE_OTHER): Payer: BC Managed Care – PPO | Admitting: Obstetrics & Gynecology

## 2013-08-14 ENCOUNTER — Encounter: Payer: Self-pay | Admitting: Obstetrics & Gynecology

## 2013-08-14 VITALS — BP 124/72 | HR 68 | Ht 62.75 in | Wt 123.0 lb

## 2013-08-14 DIAGNOSIS — Z9889 Other specified postprocedural states: Secondary | ICD-10-CM

## 2013-08-14 NOTE — Progress Notes (Signed)
Post Operative Visit  Procedure:LAPAROSCOPIC BILATERAL SALPINGO OOPHORECTOMY WITH WASHINGS  Days Post-op: 16  Subjective: Doing really well.  Has felt "great" since surgery.  We've reviewed anesthesia record and I feel this is from the dexamethasone she received.  She's not have any vaginal bleeding.  No bowel or bladder issues.  Bladder pressure has completely resolved.  Pathology and pictures reviewed.  Pt's right ovary was a serous cystadenoma.    Objective: BP 124/72  Pulse 68  Ht 5' 2.75" (1.594 m)  Wt 123 lb (55.792 kg)  BMI 21.96 kg/m2  EXAM General: alert and cooperative GI: soft, non-tender; bowel sounds normal; no masses,  no organomegaly and incision: clean, dry and intact Extremities: extremities normal, atraumatic, no cyanosis or edema Vaginal Bleeding: none  Assessment: S/p laparoscopic BSO due to serous cystadenoma  Plan: Released to return to normal activity.  Should be fine to receive next Orencia dosage.

## 2013-08-15 ENCOUNTER — Telehealth: Payer: Self-pay | Admitting: Obstetrics & Gynecology

## 2013-08-15 NOTE — Telephone Encounter (Signed)
Dr office calling wanting to know if pt is cleared orintha infusion.

## 2013-08-15 NOTE — Telephone Encounter (Signed)
Lattie Haw calling from Dr. Arlean Hopping office and states that patient is there for Orencia Infusion at this time.  Lattie Haw wanted to know if patient was cleared to receive infusion today. Per office visit notes from Dr. Sabra Heck from yesterday, 08/14/13, "Released to return to normal activity. Should be fine to receive next Orencia dosage." This information is given to [place] and a faxed copy of this office note to was sent to her attention as well with fax confirmation received to [phone].   Routing to provider for final review. Patient agreeable to disposition. Will close encounter

## 2013-12-17 ENCOUNTER — Encounter: Payer: Self-pay | Admitting: Obstetrics & Gynecology

## 2014-01-23 ENCOUNTER — Other Ambulatory Visit (HOSPITAL_COMMUNITY): Payer: Self-pay | Admitting: *Deleted

## 2014-01-24 ENCOUNTER — Encounter (HOSPITAL_COMMUNITY)
Admission: RE | Admit: 2014-01-24 | Discharge: 2014-01-24 | Disposition: A | Discharge status: Home / Self Care | Payer: BC Managed Care – PPO | Source: Ambulatory Visit | Attending: Rheumatology | Admitting: Rheumatology

## 2014-01-24 DIAGNOSIS — M069 Rheumatoid arthritis, unspecified: Secondary | ICD-10-CM | POA: Diagnosis not present

## 2014-01-24 DIAGNOSIS — Z5181 Encounter for therapeutic drug level monitoring: Secondary | ICD-10-CM | POA: Diagnosis not present

## 2014-01-24 MED ORDER — SODIUM CHLORIDE 0.9 % IV SOLN
500.0000 mg | INTRAVENOUS | Status: DC
  Administered 2014-01-24: 500 mg via INTRAVENOUS
  Filled 2014-01-24: qty 20

## 2014-01-24 MED ORDER — SODIUM CHLORIDE 0.9 % IV SOLN
INTRAVENOUS | Status: DC
  Administered 2014-01-24: 15:00:00 via INTRAVENOUS

## 2014-02-21 ENCOUNTER — Encounter (HOSPITAL_COMMUNITY)
Admission: RE | Admit: 2014-02-21 | Discharge: 2014-02-21 | Disposition: A | Discharge status: Home / Self Care | Payer: BLUE CROSS/BLUE SHIELD | Source: Ambulatory Visit | Attending: Rheumatology | Admitting: Rheumatology

## 2014-02-21 DIAGNOSIS — M069 Rheumatoid arthritis, unspecified: Secondary | ICD-10-CM | POA: Insufficient documentation

## 2014-02-21 DIAGNOSIS — Z5181 Encounter for therapeutic drug level monitoring: Secondary | ICD-10-CM | POA: Insufficient documentation

## 2014-02-21 MED ORDER — SODIUM CHLORIDE 0.9 % IV SOLN
500.0000 mg | INTRAVENOUS | Status: DC
  Administered 2014-02-21: 500 mg via INTRAVENOUS
  Filled 2014-02-21: qty 20

## 2014-02-21 MED ORDER — SODIUM CHLORIDE 0.9 % IV SOLN
INTRAVENOUS | Status: DC
  Administered 2014-02-21: 14:00:00 via INTRAVENOUS

## 2014-03-21 ENCOUNTER — Encounter (HOSPITAL_COMMUNITY): Payer: Self-pay

## 2014-03-25 ENCOUNTER — Encounter (HOSPITAL_COMMUNITY)
Admission: RE | Admit: 2014-03-25 | Discharge: 2014-03-25 | Disposition: A | Discharge status: Home / Self Care | Payer: BLUE CROSS/BLUE SHIELD | Source: Ambulatory Visit | Attending: Rheumatology | Admitting: Rheumatology

## 2014-03-25 DIAGNOSIS — Z79899 Other long term (current) drug therapy: Secondary | ICD-10-CM | POA: Insufficient documentation

## 2014-03-25 DIAGNOSIS — M069 Rheumatoid arthritis, unspecified: Secondary | ICD-10-CM | POA: Diagnosis present

## 2014-03-25 MED ORDER — SODIUM CHLORIDE 0.9 % IV SOLN
INTRAVENOUS | Status: DC
  Administered 2014-03-25: 14:00:00 via INTRAVENOUS

## 2014-03-25 MED ORDER — SODIUM CHLORIDE 0.9 % IV SOLN
500.0000 mg | INTRAVENOUS | Status: DC
  Administered 2014-03-25: 500 mg via INTRAVENOUS
  Filled 2014-03-25: qty 20

## 2014-04-19 ENCOUNTER — Other Ambulatory Visit (HOSPITAL_COMMUNITY): Payer: Self-pay | Admitting: *Deleted

## 2014-04-22 ENCOUNTER — Ambulatory Visit (HOSPITAL_COMMUNITY)
Admission: RE | Admit: 2014-04-22 | Discharge: 2014-04-22 | Disposition: A | Discharge status: Home / Self Care | Payer: BLUE CROSS/BLUE SHIELD | Source: Ambulatory Visit | Attending: Rheumatology | Admitting: Rheumatology

## 2014-04-22 DIAGNOSIS — M069 Rheumatoid arthritis, unspecified: Secondary | ICD-10-CM | POA: Diagnosis not present

## 2014-04-22 MED ORDER — DIPHENHYDRAMINE HCL 25 MG PO TABS
50.0000 mg | ORAL_TABLET | ORAL | Status: DC
  Filled 2014-04-22: qty 2

## 2014-04-22 MED ORDER — SODIUM CHLORIDE 0.9 % IV SOLN
INTRAVENOUS | Status: DC
  Administered 2014-04-22: 250 mL via INTRAVENOUS

## 2014-04-22 MED ORDER — ACETAMINOPHEN 325 MG PO TABS: 650.0000 mg | ORAL_TABLET | ORAL | Status: DC

## 2014-04-22 MED ORDER — SODIUM CHLORIDE 0.9 % IV SOLN
500.0000 mg | INTRAVENOUS | Status: DC
  Administered 2014-04-22: 500 mg via INTRAVENOUS
  Filled 2014-04-22: qty 20

## 2014-04-22 MED ORDER — DIPHENHYDRAMINE HCL 25 MG PO CAPS: 50.0000 mg | ORAL_CAPSULE | ORAL | Status: DC

## 2014-04-30 NOTE — Progress Notes (Addendum)
Anesthesia PAT Evaluation:  Patient is a 64 year old female scheduled for nasal septoplasty with bilateral turbinate reduction on 05/24/14 by Dr. Wilburn Cornelia.  Mandy from Dr. Victorio Palm office contacted me last month regarding need for anesthesia consult due to history of RA and Chiari malformation.   History includes RA, Chiari 1 malformation with impaction of the tonsils, relative stenosis of the foramen magnum on 2006 MRI, former smoker, asthma, left breast cancer s/p lumpectomy '03, right [place] Port-a-cath explantation '04, post-operative N/V, laparoscopic BSO 07/30/13. She reports that Dr. Wilburn Cornelia has diagnosed her with a left patulous eustachian tube since her last surgery, but he did not feel that it was related to her prior surgery or anesthesia. Notes indicate her daughter is an Therapist, sports. PCP is Dr. Juanita Craver.  Rheumatologist is Dr. Cy Blamer.  Medications include Orencia (to hold at least two weeks prior to surgery; last dose > 23 days ago), Estrace, Flonase, Norco, Dulera, fish oil, Prilosec, prednisone PRN RA exacerbation (not used in ~ one year), promethazine, Restoril, Ventolin. She takes Adderal a few times a month to help when she is doing more detailed bookkeeping. (She says that she gets a "brain fog" with more complex mind tasks since she underwent chemotherapy.) She will not take Adderal on the day of surgery.  Anesthesia precautions used during her 07/30/13 surgery includes: positioning awake with use of minimal trendelenburg's (due to 20 cm chiari with tonsillar involvement per MRI and RA with AO instability). Head on form cradle in neutral position. 7 mm cuffed ETT placed with neck in neutral position for intubation after induction using stylet, video laryngoscope, direct visualization.   Other anesthesia concerns:  -She was also given dexamethasone 10mg  by her anesthesia team which she feels helped her quite a bit with mobility and comfort for six weeks following her surgery.  - She  has baseline hypotension, asymptomatic, with SBP as low as the 80's at time and DBP as low as the 50's.  She did not tolerate MAC anesthesia in the past due to hypotension.  - She cannot sleep flat on her back.  Wants to make sure she does not wake up from anesthesia flat on her back.  - TMJ with limited mouth opening.   10/10/04 MRI of the brain: Chiari I malformation with impaction of the tonsils. There is some dorsal displacement of the cervicomedullary junction due to a dorsal tilt of the dens and some transverse ligament hypertrophy with relative stenosis of the foramen magnum.  09/22/04 MRI/MRA of the neck/c-spine: IMPRESSION: 1. Cervical spondylotic changes C4-5 through C6-7 contributes to mild spinal stenosis and right greater than left foraminal narrowing at the C5-6 and C6-7 levels. No large soft lateralizing disc protrusion. 2. Slightly low lying cerebellar tonsils up to 4.1 mm below the foramen magnum. IMPRESSION: No evidence of hemodynamically significant stenosis involving the major extracranial vessels as noted above. The left vertebral artery is dominant and appears slightly ectatic at the C1-2 level which may represent limitation of the present exam although mild fibromuscular dysplasia could conceivably cause a similar appearance.  She denied more recent neck/brain films. Her primary symptoms are intermittent episodes of loss of spatial orientation and severe headaches with looking up. She cannot sleep flat on her back. She has to have dental procedures done with her head at a 45 degree angle with shoulder support.  .  Her Chiari malformation was previously followed by neurosurgeon Dr. Glenna Fellows. She declined surgery, and he reportedly told her that if she  ever feels her symptoms warrant further treatment to contact him at his [place] office and he will set up a referral.   On exam, she is a pleasant Caucasian female in NAD.  Heart RRR, no murmur noted.  Lungs clear.  Small mouth  opening.  07/23/13 EKG: SR with PACs.   She brought in fasting labs (CMP, CBC with differential) from 05/17/14 (ordered by Dr. Estanislado Pandy) that showed a Na 143, K 4.3, glucose 84, Cr 0.48, AST 17, ALT 15, WBC 5.7, H/H 12.5/37.0, PLT count 352K.   In mid March, I had reviewed her RA, Chiari malformation history, and 2015 anesthesia records with anesthesiologist Dr. Conrad [place]. He anticipated need for intubation with neck in neutral position with glidescope. Today I discussed that similar anesthesia technique would be planned for this procedure, but she will also meet with her assigned anesthesiologist on the day of surgery to discuss definitive details.   George Hugh [facility] Short Stay Center/Anesthesiology Phone [phone] 05/21/2014 6:50 PM

## 2014-05-08 ENCOUNTER — Other Ambulatory Visit: Payer: Self-pay | Admitting: Otolaryngology

## 2014-05-17 ENCOUNTER — Other Ambulatory Visit: Payer: Self-pay | Admitting: Otolaryngology

## 2014-05-21 ENCOUNTER — Encounter (HOSPITAL_COMMUNITY)
Admission: RE | Admit: 2014-05-21 | Discharge: 2014-05-21 | Disposition: A | Discharge status: Home / Self Care | Payer: BLUE CROSS/BLUE SHIELD | Source: Ambulatory Visit | Attending: Otolaryngology | Admitting: Otolaryngology

## 2014-05-21 DIAGNOSIS — J343 Hypertrophy of nasal turbinates: Secondary | ICD-10-CM | POA: Diagnosis not present

## 2014-05-21 DIAGNOSIS — Z882 Allergy status to sulfonamides status: Secondary | ICD-10-CM | POA: Diagnosis not present

## 2014-05-21 DIAGNOSIS — J342 Deviated nasal septum: Secondary | ICD-10-CM | POA: Diagnosis not present

## 2014-05-21 DIAGNOSIS — Z91013 Allergy to seafood: Secondary | ICD-10-CM | POA: Diagnosis not present

## 2014-05-21 DIAGNOSIS — Z87891 Personal history of nicotine dependence: Secondary | ICD-10-CM | POA: Diagnosis not present

## 2014-05-21 DIAGNOSIS — Z91041 Radiographic dye allergy status: Secondary | ICD-10-CM | POA: Diagnosis not present

## 2014-05-21 DIAGNOSIS — J45909 Unspecified asthma, uncomplicated: Secondary | ICD-10-CM | POA: Diagnosis not present

## 2014-05-21 DIAGNOSIS — Z853 Personal history of malignant neoplasm of breast: Secondary | ICD-10-CM | POA: Diagnosis not present

## 2014-05-21 DIAGNOSIS — J3489 Other specified disorders of nose and nasal sinuses: Secondary | ICD-10-CM | POA: Diagnosis not present

## 2014-05-21 DIAGNOSIS — M069 Rheumatoid arthritis, unspecified: Secondary | ICD-10-CM | POA: Diagnosis not present

## 2014-05-21 NOTE — Pre-Procedure Instructions (Signed)
Kristy Perez  05/21/2014   Your procedure is scheduled on:  May 24, 2014  Report to [facility] Admitting at 8 AM.  Call this number if you have problems the morning of surgery: [phone]   Remember:   Do not eat food or drink liquids after midnight.   Take these medicines the morning of surgery with A SIP OF WATER: amphetamine-dextroamphetamine (ADDERALL) , pain pill if needed, promethazine  (PHENERGAN) if needed, VENTOLIN HFA 108 (90 BASE) MCG/ACT inhaler if needed   STOP FISH OIL, HERBAL SUPPLEMENTS, ASPIRIN, NSAIDS(ADVIL, IBUPROFEN) 7 DAYS PRIOR TO SURGERY  Do not wear jewelry, make-up or nail polish.  Do not wear lotions, powders, or perfumes. You may wear deodorant.  Do not shave 48 hours prior to surgery. Men may shave face and neck.  Do not bring valuables to the hospital.  [facility] is not responsible                  for any belongings or valuables.               Contacts, dentures or bridgework may not be worn into surgery.  Leave suitcase in the car. After surgery it may be brought to your room.  For patients admitted to the hospital, discharge time is determined by your                treatment team.               Patients discharged the day of surgery will not be allowed to drive  home.  Name and phone number of your driver: FAMILY/FREIND  Special Instructions: PREPARING FOR SURGERY   Please read over the following fact sheets that you were given: Pain Booklet, Coughing and Deep Breathing and Surgical Site Infection Prevention

## 2014-05-21 NOTE — Anesthesia Preprocedure Evaluation (Addendum)
Anesthesia Evaluation  Patient identified by MRN, date of birth, ID band Patient awake    Reviewed: Allergy & Precautions, NPO status   History of Anesthesia Complications (+) PONV  Airway Mallampati: II  TM Distance: >3 FB Neck ROM: Full    Dental  (+) Teeth Intact, Dental Advisory Given   Pulmonary shortness of breath, asthma , former smoker,  breath sounds clear to auscultation        Cardiovascular negative cardio ROS  Rhythm:Regular Rate:Normal     Neuro/Psych Chiari malformation    GI/Hepatic negative GI ROS, Neg liver ROS,   Endo/Other  negative endocrine ROS  Renal/GU negative Renal ROS     Musculoskeletal  (+) Arthritis -, Rheumatoid disorders,  AO instability   Abdominal   Peds  Hematology negative hematology ROS (+)   Anesthesia Other Findings   Reproductive/Obstetrics                           Anesthesia Physical Anesthesia Plan  ASA: III  Anesthesia Plan: General   Post-op Pain Management:    Induction: Intravenous  Airway Management Planned: Oral ETT and Video Laryngoscope Planned  Additional Equipment:   Intra-op Plan:   Post-operative Plan: Extubation in OR  Informed Consent: I have reviewed the patients History and Physical, chart, labs and discussed the procedure including the risks, benefits and alternatives for the proposed anesthesia with the patient or authorized representative who has indicated his/her understanding and acceptance.   Dental advisory given  Plan Discussed with: CRNA  Anesthesia Plan Comments:        Anesthesia Quick Evaluation

## 2014-05-22 ENCOUNTER — Encounter (HOSPITAL_COMMUNITY): Payer: BLUE CROSS/BLUE SHIELD

## 2014-05-23 MED ORDER — DEXAMETHASONE SODIUM PHOSPHATE 10 MG/ML IJ SOLN
10.0000 mg | Freq: Once | INTRAMUSCULAR | Status: AC
  Administered 2014-05-24: 10 mg via INTRAVENOUS
  Filled 2014-05-23: qty 1

## 2014-05-23 MED ORDER — CEFAZOLIN SODIUM-DEXTROSE 2-3 GM-% IV SOLR
2.0000 g | INTRAVENOUS | Status: AC
  Administered 2014-05-24: 2 g via INTRAVENOUS
  Filled 2014-05-23: qty 50

## 2014-05-23 NOTE — Progress Notes (Signed)
Pt verbalize understanding arrival time 6:30 am 05/24/2014

## 2014-05-24 ENCOUNTER — Encounter (HOSPITAL_COMMUNITY)
Admission: RE | Disposition: A | Discharge status: Home / Self Care | Payer: Self-pay | Source: Ambulatory Visit | Attending: Otolaryngology

## 2014-05-24 ENCOUNTER — Encounter (HOSPITAL_COMMUNITY): Payer: Self-pay | Admitting: *Deleted

## 2014-05-24 ENCOUNTER — Ambulatory Visit (HOSPITAL_COMMUNITY): Payer: BLUE CROSS/BLUE SHIELD | Admitting: Vascular Surgery

## 2014-05-24 ENCOUNTER — Ambulatory Visit (HOSPITAL_COMMUNITY)
Admission: RE | Admit: 2014-05-24 | Discharge: 2014-05-24 | Disposition: A | Discharge status: Home / Self Care | Payer: BLUE CROSS/BLUE SHIELD | Source: Ambulatory Visit | Attending: Otolaryngology | Admitting: Otolaryngology

## 2014-05-24 DIAGNOSIS — J3489 Other specified disorders of nose and nasal sinuses: Secondary | ICD-10-CM | POA: Insufficient documentation

## 2014-05-24 DIAGNOSIS — J342 Deviated nasal septum: Secondary | ICD-10-CM | POA: Diagnosis not present

## 2014-05-24 DIAGNOSIS — Z853 Personal history of malignant neoplasm of breast: Secondary | ICD-10-CM | POA: Insufficient documentation

## 2014-05-24 DIAGNOSIS — Z91041 Radiographic dye allergy status: Secondary | ICD-10-CM | POA: Insufficient documentation

## 2014-05-24 DIAGNOSIS — M069 Rheumatoid arthritis, unspecified: Secondary | ICD-10-CM | POA: Insufficient documentation

## 2014-05-24 DIAGNOSIS — J343 Hypertrophy of nasal turbinates: Secondary | ICD-10-CM | POA: Insufficient documentation

## 2014-05-24 DIAGNOSIS — Z87891 Personal history of nicotine dependence: Secondary | ICD-10-CM | POA: Insufficient documentation

## 2014-05-24 DIAGNOSIS — J45909 Unspecified asthma, uncomplicated: Secondary | ICD-10-CM | POA: Insufficient documentation

## 2014-05-24 DIAGNOSIS — Z91013 Allergy to seafood: Secondary | ICD-10-CM | POA: Insufficient documentation

## 2014-05-24 DIAGNOSIS — Z882 Allergy status to sulfonamides status: Secondary | ICD-10-CM | POA: Insufficient documentation

## 2014-05-24 HISTORY — PX: NASAL SEPTOPLASTY W/ TURBINOPLASTY: SHX2070

## 2014-05-24 SURGERY — SEPTOPLASTY, NOSE, WITH NASAL TURBINATE REDUCTION
Anesthesia: General | Site: Nose | Laterality: Bilateral

## 2014-05-24 MED ORDER — PROMETHAZINE HCL 25 MG/ML IJ SOLN
INTRAMUSCULAR | Status: AC
  Filled 2014-05-24: qty 1

## 2014-05-24 MED ORDER — SODIUM CHLORIDE 0.9 % IV SOLN: INTRAVENOUS | Status: DC

## 2014-05-24 MED ORDER — LACTATED RINGERS IV SOLN
INTRAVENOUS | Status: DC
  Administered 2014-05-24: 07:00:00 via INTRAVENOUS

## 2014-05-24 MED ORDER — PROMETHAZINE HCL 25 MG/ML IJ SOLN
6.2500 mg | INTRAMUSCULAR | Status: DC | PRN
  Administered 2014-05-24: 6.25 mg via INTRAVENOUS

## 2014-05-24 MED ORDER — OXYMETAZOLINE HCL 0.05 % NA SOLN
NASAL | Status: DC | PRN
  Administered 2014-05-24: 2 via NASAL

## 2014-05-24 MED ORDER — SCOPOLAMINE 1 MG/3DAYS TD PT72
1.0000 | MEDICATED_PATCH | TRANSDERMAL | Status: DC
  Administered 2014-05-24: 1.5 mg via TRANSDERMAL
  Filled 2014-05-24: qty 1

## 2014-05-24 MED ORDER — HYDROMORPHONE HCL 1 MG/ML IJ SOLN: 0.2500 mg | INTRAMUSCULAR | Status: DC | PRN

## 2014-05-24 MED ORDER — ROCURONIUM BROMIDE 100 MG/10ML IV SOLN
INTRAVENOUS | Status: DC | PRN
  Administered 2014-05-24: 30 mg via INTRAVENOUS

## 2014-05-24 MED ORDER — OXYCODONE HCL 5 MG PO TABS: 5.0000 mg | ORAL_TABLET | Freq: Once | ORAL | Status: DC | PRN

## 2014-05-24 MED ORDER — FENTANYL CITRATE 0.05 MG/ML IJ SOLN
INTRAMUSCULAR | Status: DC | PRN
  Administered 2014-05-24: 100 ug via INTRAVENOUS

## 2014-05-24 MED ORDER — PHENYLEPHRINE HCL 10 MG/ML IJ SOLN
INTRAMUSCULAR | Status: DC | PRN
  Administered 2014-05-24: 40 ug via INTRAVENOUS

## 2014-05-24 MED ORDER — ONDANSETRON HCL 4 MG/2ML IJ SOLN
INTRAMUSCULAR | Status: DC | PRN
  Administered 2014-05-24: 4 mg via INTRAVENOUS

## 2014-05-24 MED ORDER — MIDAZOLAM HCL 5 MG/5ML IJ SOLN
INTRAMUSCULAR | Status: DC | PRN
  Administered 2014-05-24: 2 mg via INTRAVENOUS

## 2014-05-24 MED ORDER — AMOXICILLIN-POT CLAVULANATE 500-125 MG PO TABS: 1.0000 | ORAL_TABLET | Freq: Two times a day (BID) | ORAL | Status: DC

## 2014-05-24 MED ORDER — PROPOFOL 10 MG/ML IV BOLUS
INTRAVENOUS | Status: DC | PRN
  Administered 2014-05-24: 150 mg via INTRAVENOUS

## 2014-05-24 MED ORDER — OXYCODONE HCL 5 MG/5ML PO SOLN: 5.0000 mg | Freq: Once | ORAL | Status: DC | PRN

## 2014-05-24 MED ORDER — KETOROLAC TROMETHAMINE 30 MG/ML IJ SOLN
30.0000 mg | Freq: Once | INTRAMUSCULAR | Status: AC | PRN
  Administered 2014-05-24: 30 mg via INTRAVENOUS

## 2014-05-24 MED ORDER — SODIUM CHLORIDE 0.9 % IV SOLN: 500.0000 mg | INTRAVENOUS | Status: DC

## 2014-05-24 MED ORDER — GLYCOPYRROLATE 0.2 MG/ML IJ SOLN
INTRAMUSCULAR | Status: DC | PRN
  Administered 2014-05-24: .4 mg via INTRAVENOUS

## 2014-05-24 MED ORDER — LIDOCAINE-EPINEPHRINE 1 %-1:100000 IJ SOLN
INTRAMUSCULAR | Status: AC
  Filled 2014-05-24: qty 1

## 2014-05-24 MED ORDER — OXYMETAZOLINE HCL 0.05 % NA SOLN
NASAL | Status: DC | PRN
  Administered 2014-05-24: 1 via NASAL

## 2014-05-24 MED ORDER — MUPIROCIN CALCIUM 2 % EX CREA
TOPICAL_CREAM | CUTANEOUS | Status: DC | PRN
  Administered 2014-05-24: 1 via TOPICAL

## 2014-05-24 MED ORDER — MIDAZOLAM HCL 2 MG/2ML IJ SOLN
INTRAMUSCULAR | Status: AC
  Filled 2014-05-24: qty 2

## 2014-05-24 MED ORDER — 0.9 % SODIUM CHLORIDE (POUR BTL) OPTIME
TOPICAL | Status: DC | PRN
  Administered 2014-05-24: 1000 mL

## 2014-05-24 MED ORDER — PROPOFOL 10 MG/ML IV BOLUS
INTRAVENOUS | Status: AC
  Filled 2014-05-24: qty 20

## 2014-05-24 MED ORDER — HYDROCODONE-ACETAMINOPHEN 5-325 MG PO TABS
1.0000 | ORAL_TABLET | Freq: Four times a day (QID) | ORAL | Status: DC | PRN
Start: 2014-05-24 — End: 2016-02-27

## 2014-05-24 MED ORDER — KETOROLAC TROMETHAMINE 30 MG/ML IJ SOLN
INTRAMUSCULAR | Status: AC
  Filled 2014-05-24: qty 1

## 2014-05-24 MED ORDER — FENTANYL CITRATE 0.05 MG/ML IJ SOLN
INTRAMUSCULAR | Status: AC
  Filled 2014-05-24: qty 5

## 2014-05-24 MED ORDER — MUPIROCIN CALCIUM 2 % EX CREA
TOPICAL_CREAM | CUTANEOUS | Status: AC
  Filled 2014-05-24: qty 15

## 2014-05-24 MED ORDER — OXYMETAZOLINE HCL 0.05 % NA SOLN
NASAL | Status: AC
  Filled 2014-05-24: qty 15

## 2014-05-24 MED ORDER — NEOSTIGMINE METHYLSULFATE 10 MG/10ML IV SOLN
INTRAVENOUS | Status: DC | PRN
  Administered 2014-05-24: 3 mg via INTRAVENOUS

## 2014-05-24 MED ORDER — LIDOCAINE HCL (CARDIAC) 20 MG/ML IV SOLN
INTRAVENOUS | Status: DC | PRN
  Administered 2014-05-24: 50 mg via INTRAVENOUS

## 2014-05-24 MED ORDER — LIDOCAINE-EPINEPHRINE 1 %-1:100000 IJ SOLN
INTRAMUSCULAR | Status: DC | PRN
  Administered 2014-05-24: 20 mL

## 2014-05-24 SURGICAL SUPPLY — 21 items
CANISTER SUCTION 2500CC (MISCELLANEOUS) ×2 IMPLANT
COAGULATOR SUCT SWTCH 10FR 6 (ELECTROSURGICAL) IMPLANT
ELECT REM PT RETURN 9FT ADLT (ELECTROSURGICAL)
ELECTRODE REM PT RTRN 9FT ADLT (ELECTROSURGICAL) IMPLANT
GAUZE SPONGE 2X2 8PLY STRL LF (GAUZE/BANDAGES/DRESSINGS) ×1 IMPLANT
GLOVE BIOGEL M 7.0 STRL (GLOVE) ×4 IMPLANT
GOWN STRL REUS W/ TWL LRG LVL3 (GOWN DISPOSABLE) ×2 IMPLANT
GOWN STRL REUS W/TWL LRG LVL3 (GOWN DISPOSABLE) ×6
KIT BASIN OR (CUSTOM PROCEDURE TRAY) ×2 IMPLANT
KIT ROOM TURNOVER OR (KITS) ×2 IMPLANT
NS IRRIG 1000ML POUR BTL (IV SOLUTION) ×2 IMPLANT
PAD ARMBOARD 7.5X6 YLW CONV (MISCELLANEOUS) ×6 IMPLANT
SPLINT NASAL DOYLE BI-VL (GAUZE/BANDAGES/DRESSINGS) ×2 IMPLANT
SPONGE GAUZE 2X2 STER 10/PKG (GAUZE/BANDAGES/DRESSINGS) ×1
SPONGE NEURO XRAY DETECT 1X3 (DISPOSABLE) ×2 IMPLANT
SUT ETHILON 3 0 PS 1 (SUTURE) ×2 IMPLANT
SUT PLAIN 4 0 ~~LOC~~ 1 (SUTURE) ×2 IMPLANT
TOWEL OR 17X24 6PK STRL BLUE (TOWEL DISPOSABLE) ×2 IMPLANT
TRAY ENT MC OR (CUSTOM PROCEDURE TRAY) ×2 IMPLANT
TUBE SALEM SUMP 16 FR W/ARV (TUBING) ×2 IMPLANT
TUBING EXTENTION W/L.L. (IV SETS) ×2 IMPLANT

## 2014-05-24 NOTE — Progress Notes (Signed)
Patient claims eyes feels better after saline flush, "burn is 1/10 compared to earlier".

## 2014-05-24 NOTE — Anesthesia Postprocedure Evaluation (Signed)
  Anesthesia Post-op Note  [name]  Procedure(s) Performed: Procedure(s): NASAL SEPTOPLASTY WITH  BILATERAL TURBINATE REDUCTION (Bilateral)  Patient Location: PACU  Anesthesia Type:General  Level of Consciousness: awake and alert   Airway and Oxygen Therapy: Patient Spontanous Breathing  Post-op Pain: mild  Post-op Assessment: Post-op Vital signs reviewed  Post-op Vital Signs: Reviewed  Last Vitals:  Filed Vitals:   05/24/14 1228  BP:   Pulse:   Temp: 36.9 C  Resp:     Complications: No apparent anesthesia complications

## 2014-05-24 NOTE — Anesthesia Procedure Notes (Signed)
Procedure Name: Intubation Date/Time: 05/24/2014 8:51 AM Performed by: Shirlyn Goltz Pre-anesthesia Checklist: Patient identified, Emergency Drugs available, Suction available and Patient being monitored Patient Re-evaluated:Patient Re-evaluated prior to inductionOxygen Delivery Method: Circle system utilized Preoxygenation: Pre-oxygenation with 100% oxygen Intubation Type: IV induction Laryngoscope Size: Glidescope Grade View: Grade I Tube size: 7.5 mm Number of attempts: 1 Airway Equipment and Method: Stylet and Video-laryngoscopy Placement Confirmation: ETT inserted through vocal cords under direct vision,  positive ETCO2 and breath sounds checked- equal and bilateral Secured at: 20 cm Tube secured with: Tape Dental Injury: Teeth and Oropharynx as per pre-operative assessment

## 2014-05-24 NOTE — Brief Op Note (Signed)
05/24/2014  9:45 AM  PATIENT:  Kristy Perez  64 y.o. female  PRE-OPERATIVE DIAGNOSIS:  DEVITATED NASAL SEPTUM  POST-OPERATIVE DIAGNOSIS:  DEVITATED NASAL SEPTUM  PROCEDURE:  Procedure(s): NASAL SEPTOPLASTY WITH  BILATERAL TURBINATE REDUCTION (Bilateral)  SURGEON:  Surgeon(s) and Role:    * Jerrell Belfast, MD - Primary  PHYSICIAN ASSISTANT:   ASSISTANTS: none   ANESTHESIA:   general  EBL:  Total I/O In: -  Out: 50 [Blood:50]  BLOOD ADMINISTERED:none  DRAINS: none   LOCAL MEDICATIONS USED:  LIDOCAINE  and Amount: 7 ml  SPECIMEN:  No Specimen  DISPOSITION OF SPECIMEN:  N/A  COUNTS:  YES  TOURNIQUET:  * No tourniquets in log *  DICTATION: .Other Dictation: Dictation Number [id number]  PLAN OF CARE: Discharge to home after PACU  PATIENT DISPOSITION:  PACU - hemodynamically stable.   Delay start of Pharmacological VTE agent (>24hrs) due to surgical blood loss or risk of bleeding: not applicable

## 2014-05-24 NOTE — Transfer of Care (Signed)
Immediate Anesthesia Transfer of Care Note  [name]  Procedure(s) Performed: Procedure(s): NASAL SEPTOPLASTY WITH  BILATERAL TURBINATE REDUCTION (Bilateral)  Patient Location: PACU  Anesthesia Type:General  Level of Consciousness: awake, alert , oriented and patient cooperative  Airway & Oxygen Therapy: Patient Spontanous Breathing and Patient connected to face mask oxygen  Post-op Assessment: Report given to RN, Post -op Vital signs reviewed and stable, Patient moving all extremities and Patient moving all extremities X 4  Post vital signs: Reviewed and stable  Last Vitals:  Filed Vitals:   05/24/14 0951  BP:   Pulse:   Temp: 36.3 C  Resp:     Complications: No apparent anesthesia complications

## 2014-05-24 NOTE — H&P (Signed)
Kristy Perez is an 64 y.o. female.   Chief Complaint: Nasal obstruction HPI: Chronic progressive nasal obstruction from deviated nasal septum  Past Medical History  Diagnosis Date  . Cancer     breast, left  . Rheumatoid arthritis     on Humira  . Chiari malformation   . Asthma     inhaler one x per day  . PONV (postoperative nausea and vomiting)     Past Surgical History  Procedure Laterality Date  . Total vaginal hysterectomy  1989    with AP repair  . Colporrhaphy    . Breast surgery Left 2003    Lumpectomy T1C1, NO and negative ER/ PR  . Pelvic floor repair  3/04    SPARC with AP colporrhaphy  . Colonoscopy  10/04  . Colonoscopy  11/10    normal recheck in 5 years  . Esophagogastroduodenoscopy endoscopy  11/10    GERD  . Foot surgery Left age 76    removal of pseudo rheumatoid nodules from left forefoot.  . Laparoscopic bilateral salpingo oopherectomy N/A 07/30/2013    Procedure: LAPAROSCOPIC BILATERAL SALPINGO OOPHORECTOMY WITH WASHINGS;  Surgeon: Lyman Speller, MD;  Location: [facility];  Service: Gynecology;  Laterality: N/A;    Family History  Problem Relation Age of Onset  . Diabetes Mother   . Heart failure Mother   . Bladder Cancer Mother   . Stroke Mother   . Uterine cancer Maternal Aunt   . Bladder Cancer Maternal Grandmother   . Colon cancer Maternal Grandmother   . Cancer Father     head and neck   Social History:  reports that she quit smoking about 15 years ago. Her smoking use included Cigarettes. She has never used smokeless tobacco. She reports that she does not drink alcohol. Her drug history is not on file.  Allergies:  Allergies  Allergen Reactions  . Fish Allergy Anaphylaxis  . Iodine Anaphylaxis    " PER PT IV CONTRAST"  . Sulfonamide Derivatives Other (See Comments)    Tongue turns black.    Medications Prior to Admission  Medication Sig Dispense Refill  . amphetamine-dextroamphetamine (ADDERALL) 10 MG tablet Take 10 mg by  mouth daily as needed.  0  . estradiol (ESTRACE) 0.1 MG/GM vaginal cream Place 0.5 g vaginally once a week. Use 1/2 g vaginally twice weekly    . fluticasone (FLONASE) 50 MCG/ACT nasal spray Place 1 spray into both nostrils daily.    Marland Kitchen ibuprofen (ADVIL,MOTRIN) 200 MG tablet Take 400 mg by mouth every 6 (six) hours as needed (for inflammation).    . Magnesium 250 MG TABS Take 4 tablets by mouth daily.    . mometasone-formoterol (DULERA) 100-5 MCG/ACT AERO Inhale 1 puff into the lungs daily.     . Omega-3 Fatty Acids (FISH OIL) 1000 MG CAPS Take 1,000 mg by mouth daily.    Marland Kitchen omeprazole (PRILOSEC) 40 MG capsule Take 40 mg by mouth 2 (two) times daily.     . RESTASIS 0.05 % ophthalmic emulsion Place 1 drop into both eyes 2 (two) times daily.    . temazepam (RESTORIL) 30 MG capsule Take 1 capsule by mouth at bedtime.    . VENTOLIN HFA 108 (90 BASE) MCG/ACT inhaler Inhale 2 puffs into the lungs every 4 (four) hours as needed.    . Abatacept (ORENCIA IV) Inject 750 mg into the vein. Once monthly    . HYDROcodone-acetaminophen (NORCO/VICODIN) 5-325 MG per tablet Take 0.5 tablets by mouth  every 4 (four) hours as needed for moderate pain.    Marland Kitchen lidocaine (LIDODERM) 5 % Place 1 patch onto the skin daily as needed (for shoulder pain).     Marland Kitchen oxyCODONE-acetaminophen (PERCOCET) 5-325 MG per tablet Take 1 tablet by mouth every 4 (four) hours as needed. use only as much as needed to relieve pain (Patient not taking: Reported on 05/17/2014) 20 tablet 0  . predniSONE (DELTASONE) 10 MG tablet as directed.     . promethazine (PHENERGAN) 25 MG tablet Take 25 mg by mouth every 6 (six) hours as needed for nausea or vomiting.      No results found for this or any previous visit (from the past 48 hour(s)). No results found.  Review of Systems  Constitutional: Negative.   HENT: Negative.   Respiratory: Negative.   Cardiovascular: Negative.   Skin: Negative.     Blood pressure 121/67, pulse 68, temperature 97.8 F  (36.6 C), temperature source Oral, resp. rate 20, height 5\' 2"  (1.575 m), weight 56.7 kg (125 lb), SpO2 100 %. Physical Exam  Constitutional: She is oriented to person, place, and time. She appears well-developed and well-nourished.  HENT:  Nasal obstruction from deviated nasal septum  Neck: Normal range of motion. Neck supple.  Cardiovascular: Normal rate.   Respiratory: Effort normal.  Musculoskeletal: Normal range of motion.  Neurological: She is alert and oriented to person, place, and time.     Assessment/Plan Adm for OP nasal airway surgery under GA  [name] 05/24/2014, 8:22 AM

## 2014-05-24 NOTE — Progress Notes (Signed)
Dr. Deatra Canter notified about patient's c/o eyes "burning". Bilateral eyes not red, no c/o itching nor drainage noted. Order to do saline flush.

## 2014-05-27 NOTE — Op Note (Signed)
Kristy Perez, Kristy Perez                 ACCOUNT NO.:  [id number]  MEDICAL RECORD NO.:  [id number]  LOCATION:                                 FACILITY:  PHYSICIAN:  Early Chars. Wilburn Cornelia, M.D.DATE OF BIRTH:  04-22-1950  DATE OF PROCEDURE:  05/24/2014 DATE OF DISCHARGE:  05/24/2014                              OPERATIVE REPORT   LOCATION:  [facility] Main OR.  PREOPERATIVE DIAGNOSES: 1. Deviated nasal septum with airway obstruction. 2. Bilateral inferior turbinate hypertrophy.  POSTOPERATIVE DIAGNOSES: 1. Deviated nasal septum with airway obstruction. 2. Bilateral inferior turbinate hypertrophy.  INDICATION FOR SURGERY: 1. Deviated nasal septum with airway obstruction. 2. Bilateral inferior turbinate hypertrophy.  ANESTHESIA:  General endotracheal.  COMPLICATIONS:  None.  ESTIMATED BLOOD LOSS:  Less than 50 mL.  The patient transferred from operating room to the recovery room in stable condition.  BRIEF HISTORY:  The patient is a 64 year old white female, with a progressive history of nasal airway obstruction and severe nighttime nasal airway congestion.  She had been treated with medications for allergies including topical and oral steroids, and antihistamines and despite appropriate medical therapy continued to have increasing symptoms.  examination in the office showed a severely deviated nasal septum with near complete right-sided nasal airway obstruction and bilateral inferior turbinate hypertrophy.  Given her history and examination, I recommended septoplasty and inferior turbinate reduction. The risks and benefits were discussed in detail.  The patient understood and concurred with our plan for surgery which is scheduled under general anesthesia as an outpatient at the [facility].  DESCRIPTION OF PROCEDURE:  The patient was brought to the operating room on May 24, 2014, and placed in supine position on the operating table. General endotracheal  anesthesia was established without difficulty. When the patient was adequately anesthetized, she was positioned and prepped and draped in a sterile fashion.  Her nose was injected with a total of 7 mL of 1% lidocaine with 1:100,000 solution of epinephrine was injected in a submucosal fashion along the nasal septum and inferior turbinates bilaterally.  After allowing adequate time for vasoconstriction and hemostasis, the patient was prepped and draped in a sterile fashion.  Surgical procedure was begun.  Left anterior hemitransfixion incision was created and mucoperichondrial flap was elevated from anterior to posterior on the left-hand side.  The anterior cartilaginous septum was crossed the midline and mucoperichondrial flap was elevated on the right.  Mid septal cartilage was removed.  This was later morselized and returned to the mucoperichondrial pocket.  Dissection was then carried from anterior to posterior mobilizing deviated bone and cartilage of the nasal septum and creating a good midline septum.  The patient had a large inferior septal spur made of cartilage and bone which was mobilized preserving the overlying mucosa.  This excessive bone and cartilage then resected to create a more patent nasal passageway.  The morselized septal cartilage was returned to the mucoperichondrial pocket and the flaps were reapproximated with a 4-0 gut suture on a Keith needle in a horizontal mattress fashion.  The anterior hemitransfixion incision was closed with the same stitch.  At the conclusion of the procedure, bilateral Doyle nasal  septal splints were placed after the application of Bactroban ointment and sutured position with a 3-0 Ethilon suture.  Inferior turbinate reduction was then performed with cautery set at 12 watts.  Two submucosal passes were made in each inferior turbinate. Anterior incisions were created and the soft tissue was elevated.  A small amount of turbinate bone was  resected.  The turbinates were then outfractured creating more patent nasal cavity.  The patient's nasal cavity was irrigated and suctioned.  There was no active bleeding.  An orogastric tube was passed.  The stomach contents were aspirated.  The patient was awakened from anesthetic.  She was then extubated and transferred from the operating room to recovery room in stable condition.  There were no complications.  Blood loss less than 50 mL.          ______________________________ Early Chars. Wilburn Cornelia, M.D.     DLS/MEDQ  D:  35/52/1747  T:  05/25/2014  Job:  [id number]

## 2014-05-28 ENCOUNTER — Encounter (HOSPITAL_COMMUNITY): Payer: Self-pay | Admitting: Otolaryngology

## 2014-07-26 ENCOUNTER — Other Ambulatory Visit (HOSPITAL_COMMUNITY): Payer: Self-pay | Admitting: *Deleted

## 2014-07-29 ENCOUNTER — Encounter (HOSPITAL_COMMUNITY)
Admission: RE | Admit: 2014-07-29 | Discharge: 2014-07-29 | Disposition: A | Discharge status: Home / Self Care | Payer: BLUE CROSS/BLUE SHIELD | Source: Ambulatory Visit | Attending: Rheumatology | Admitting: Rheumatology

## 2014-07-29 DIAGNOSIS — M069 Rheumatoid arthritis, unspecified: Secondary | ICD-10-CM | POA: Insufficient documentation

## 2014-07-29 LAB — COMPREHENSIVE METABOLIC PANEL
ALT: 16 U/L (ref 14–54)
AST: 19 U/L (ref 15–41)
Albumin: 3.6 g/dL (ref 3.5–5.0)
Alkaline Phosphatase: 64 U/L (ref 38–126)
Anion gap: 6 (ref 5–15)
BILIRUBIN TOTAL: 0.6 mg/dL (ref 0.3–1.2)
BUN: 7 mg/dL (ref 6–20)
CO2: 24 mmol/L (ref 22–32)
Calcium: 8.6 mg/dL — ABNORMAL LOW (ref 8.9–10.3)
Chloride: 110 mmol/L (ref 101–111)
Creatinine, Ser: 0.51 mg/dL (ref 0.44–1.00)
GFR calc Af Amer: >60 mL/min (ref >60)
GFR calc non Af Amer: >60 mL/min (ref >60)
GLUCOSE: 106 mg/dL — AB (ref 65–99)
POTASSIUM: 3.7 mmol/L (ref 3.5–5.1)
SODIUM: 140 mmol/L (ref 135–145)
Total Protein: 6.3 g/dL — ABNORMAL LOW (ref 6.5–8.1)

## 2014-07-29 LAB — DIFFERENTIAL
Basophils Absolute: 0 K/uL (ref 0.0–0.1)
Basophils Relative: 0 % (ref 0–1)
Eosinophils Absolute: 0.3 K/uL (ref 0.0–0.7)
Eosinophils Relative: 4 % (ref 0–5)
Lymphocytes Relative: 25 % (ref 12–46)
Lymphs Abs: 1.7 K/uL (ref 0.7–4.0)
Monocytes Absolute: 0.5 K/uL (ref 0.1–1.0)
Monocytes Relative: 7 % (ref 3–12)
Neutro Abs: 4.2 K/uL (ref 1.7–7.7)
Neutrophils Relative %: 64 % (ref 43–77)

## 2014-07-29 LAB — CBC
HCT: 34.3 % — ABNORMAL LOW (ref 36.0–46.0)
Hemoglobin: 11.6 g/dL — ABNORMAL LOW (ref 12.0–15.0)
MCH: 27.7 pg (ref 26.0–34.0)
MCHC: 33.8 g/dL (ref 30.0–36.0)
MCV: 81.9 fL (ref 78.0–100.0)
Platelets: 309 10*3/uL (ref 150–400)
RBC: 4.19 MIL/uL (ref 3.87–5.11)
RDW: 13.4 % (ref 11.5–15.5)
WBC: 6.7 10*3/uL (ref 4.0–10.5)

## 2014-07-29 MED ORDER — SODIUM CHLORIDE 0.9 % IV SOLN
500.0000 mg | INTRAVENOUS | Status: DC
  Administered 2014-07-29: 500 mg via INTRAVENOUS
  Filled 2014-07-29: qty 20

## 2014-07-29 MED ORDER — ACETAMINOPHEN 325 MG PO TABS: 650.0000 mg | ORAL_TABLET | ORAL | Status: DC

## 2014-07-29 MED ORDER — SODIUM CHLORIDE 0.9 % IV SOLN
INTRAVENOUS | Status: DC
  Administered 2014-07-29: 09:00:00 via INTRAVENOUS

## 2014-07-29 MED ORDER — DIPHENHYDRAMINE HCL 25 MG PO CAPS: 25.0000 mg | ORAL_CAPSULE | ORAL | Status: DC

## 2014-08-27 ENCOUNTER — Ambulatory Visit (HOSPITAL_COMMUNITY)
Admission: RE | Admit: 2014-08-27 | Discharge: 2014-08-27 | Disposition: A | Discharge status: Home / Self Care | Payer: BLUE CROSS/BLUE SHIELD | Source: Ambulatory Visit | Attending: Rheumatology | Admitting: Rheumatology

## 2014-08-27 DIAGNOSIS — M069 Rheumatoid arthritis, unspecified: Secondary | ICD-10-CM | POA: Insufficient documentation

## 2014-08-27 MED ORDER — DIPHENHYDRAMINE HCL 25 MG PO CAPS: 25.0000 mg | ORAL_CAPSULE | ORAL | Status: DC

## 2014-08-27 MED ORDER — ACETAMINOPHEN 325 MG PO TABS: 650.0000 mg | ORAL_TABLET | ORAL | Status: DC

## 2014-08-27 MED ORDER — SODIUM CHLORIDE 0.9 % IV SOLN
500.0000 mg | INTRAVENOUS | Status: DC
  Administered 2014-08-27: 500 mg via INTRAVENOUS
  Filled 2014-08-27: qty 20

## 2014-08-27 MED ORDER — SODIUM CHLORIDE 0.9 % IV SOLN
INTRAVENOUS | Status: DC
  Administered 2014-08-27: 10:00:00 via INTRAVENOUS

## 2014-09-23 ENCOUNTER — Other Ambulatory Visit (HOSPITAL_COMMUNITY): Payer: Self-pay

## 2014-09-24 ENCOUNTER — Encounter (HOSPITAL_COMMUNITY)
Admission: RE | Admit: 2014-09-24 | Discharge: 2014-09-24 | Disposition: A | Discharge status: Home / Self Care | Payer: BLUE CROSS/BLUE SHIELD | Source: Ambulatory Visit | Attending: Rheumatology | Admitting: Rheumatology

## 2014-09-24 DIAGNOSIS — M069 Rheumatoid arthritis, unspecified: Secondary | ICD-10-CM | POA: Insufficient documentation

## 2014-09-24 LAB — COMPREHENSIVE METABOLIC PANEL
ALK PHOS: 63 U/L (ref 38–126)
ALT: 15 U/L (ref 14–54)
ANION GAP: 11 (ref 5–15)
AST: 25 U/L (ref 15–41)
Albumin: 3.7 g/dL (ref 3.5–5.0)
BILIRUBIN TOTAL: 0.6 mg/dL (ref 0.3–1.2)
BUN: 10 mg/dL (ref 6–20)
CO2: 23 mmol/L (ref 22–32)
Calcium: 8.9 mg/dL (ref 8.9–10.3)
Chloride: 106 mmol/L (ref 101–111)
Creatinine, Ser: 0.57 mg/dL (ref 0.44–1.00)
GFR calc Af Amer: >60 mL/min (ref >60)
GFR calc non Af Amer: >60 mL/min (ref >60)
Glucose, Bld: 110 mg/dL — ABNORMAL HIGH (ref 65–99)
Potassium: 3.9 mmol/L (ref 3.5–5.1)
SODIUM: 140 mmol/L (ref 135–145)
Total Protein: 6.7 g/dL (ref 6.5–8.1)

## 2014-09-24 LAB — DIFFERENTIAL
BASOS ABS: 0 10*3/uL (ref 0.0–0.1)
BASOS PCT: 0 % (ref 0–1)
EOS PCT: 2 % (ref 0–5)
Eosinophils Absolute: 0.2 10*3/uL (ref 0.0–0.7)
LYMPHS PCT: 21 % (ref 12–46)
Lymphs Abs: 2.2 10*3/uL (ref 0.7–4.0)
Monocytes Absolute: 0.8 10*3/uL (ref 0.1–1.0)
Monocytes Relative: 8 % (ref 3–12)
NEUTROS ABS: 7.1 10*3/uL (ref 1.7–7.7)
Neutrophils Relative %: 69 % (ref 43–77)

## 2014-09-24 LAB — CBC
HCT: 37 % (ref 36.0–46.0)
HEMOGLOBIN: 12.4 g/dL (ref 12.0–15.0)
MCH: 27.7 pg (ref 26.0–34.0)
MCHC: 33.5 g/dL (ref 30.0–36.0)
MCV: 82.8 fL (ref 78.0–100.0)
Platelets: 373 10*3/uL (ref 150–400)
RBC: 4.47 MIL/uL (ref 3.87–5.11)
RDW: 13.7 % (ref 11.5–15.5)
WBC: 10.2 10*3/uL (ref 4.0–10.5)

## 2014-09-24 MED ORDER — SODIUM CHLORIDE 0.9 % IV SOLN
500.0000 mg | INTRAVENOUS | Status: AC
  Administered 2014-09-24: 500 mg via INTRAVENOUS
  Filled 2014-09-24: qty 20

## 2014-09-24 MED ORDER — ACETAMINOPHEN 325 MG PO TABS: 650.0000 mg | ORAL_TABLET | Freq: Once | ORAL | Status: DC

## 2014-09-24 MED ORDER — DIPHENHYDRAMINE HCL 25 MG PO TABS
25.0000 mg | ORAL_TABLET | Freq: Once | ORAL | Status: DC
  Filled 2014-09-24: qty 1

## 2014-10-18 ENCOUNTER — Other Ambulatory Visit (HOSPITAL_COMMUNITY): Payer: Self-pay | Admitting: *Deleted

## 2014-10-22 ENCOUNTER — Encounter (HOSPITAL_COMMUNITY)
Admission: RE | Admit: 2014-10-22 | Discharge: 2014-10-22 | Disposition: A | Discharge status: Home / Self Care | Payer: BLUE CROSS/BLUE SHIELD | Source: Ambulatory Visit | Attending: Rheumatology | Admitting: Rheumatology

## 2014-10-22 DIAGNOSIS — M069 Rheumatoid arthritis, unspecified: Secondary | ICD-10-CM | POA: Insufficient documentation

## 2014-10-22 MED ORDER — SODIUM CHLORIDE 0.9 % IV SOLN
Freq: Once | INTRAVENOUS | Status: DC
  Administered 2014-10-22: 250 mL via INTRAVENOUS

## 2014-10-22 MED ORDER — DIPHENHYDRAMINE HCL 25 MG PO CAPS: 25.0000 mg | ORAL_CAPSULE | ORAL | Status: DC

## 2014-10-22 MED ORDER — ACETAMINOPHEN 325 MG PO TABS: 650.0000 mg | ORAL_TABLET | ORAL | Status: DC

## 2014-10-22 MED ORDER — SODIUM CHLORIDE 0.9 % IV SOLN
500.0000 mg | INTRAVENOUS | Status: DC
  Administered 2014-10-22: 500 mg via INTRAVENOUS
  Filled 2014-10-22: qty 20

## 2014-11-18 ENCOUNTER — Other Ambulatory Visit (HOSPITAL_COMMUNITY): Payer: Self-pay | Admitting: *Deleted

## 2014-11-19 ENCOUNTER — Encounter (HOSPITAL_COMMUNITY)
Admission: RE | Admit: 2014-11-19 | Discharge: 2014-11-19 | Disposition: A | Discharge status: Home / Self Care | Payer: BLUE CROSS/BLUE SHIELD | Source: Ambulatory Visit | Attending: Rheumatology | Admitting: Rheumatology

## 2014-11-19 DIAGNOSIS — M069 Rheumatoid arthritis, unspecified: Secondary | ICD-10-CM | POA: Diagnosis not present

## 2014-11-19 LAB — CBC
HCT: 35.3 % — ABNORMAL LOW (ref 36.0–46.0)
Hemoglobin: 11.7 g/dL — ABNORMAL LOW (ref 12.0–15.0)
MCH: 27.6 pg (ref 26.0–34.0)
MCHC: 33.1 g/dL (ref 30.0–36.0)
MCV: 83.3 fL (ref 78.0–100.0)
PLATELETS: 367 10*3/uL (ref 150–400)
RBC: 4.24 MIL/uL (ref 3.87–5.11)
RDW: 13.2 % (ref 11.5–15.5)
WBC: 7.8 10*3/uL (ref 4.0–10.5)

## 2014-11-19 LAB — COMPREHENSIVE METABOLIC PANEL
ALBUMIN: 3.5 g/dL (ref 3.5–5.0)
ALT: 15 U/L (ref 14–54)
ANION GAP: 8 (ref 5–15)
AST: 20 U/L (ref 15–41)
Alkaline Phosphatase: 62 U/L (ref 38–126)
BUN: 8 mg/dL (ref 6–20)
CHLORIDE: 103 mmol/L (ref 101–111)
CO2: 27 mmol/L (ref 22–32)
Calcium: 9.1 mg/dL (ref 8.9–10.3)
Creatinine, Ser: 0.52 mg/dL (ref 0.44–1.00)
GFR calc non Af Amer: >60 mL/min (ref >60)
GLUCOSE: 96 mg/dL (ref 65–99)
POTASSIUM: 3.8 mmol/L (ref 3.5–5.1)
SODIUM: 138 mmol/L (ref 135–145)
Total Bilirubin: 0.9 mg/dL (ref 0.3–1.2)
Total Protein: 6 g/dL — ABNORMAL LOW (ref 6.5–8.1)

## 2014-11-19 LAB — DIFFERENTIAL
BASOS ABS: 0 10*3/uL (ref 0.0–0.1)
BASOS PCT: 0 %
Eosinophils Absolute: 0.2 10*3/uL (ref 0.0–0.7)
Eosinophils Relative: 3 %
LYMPHS ABS: 2.1 10*3/uL (ref 0.7–4.0)
LYMPHS PCT: 27 %
MONOS PCT: 9 %
Monocytes Absolute: 0.7 10*3/uL (ref 0.1–1.0)
NEUTROS ABS: 4.8 10*3/uL (ref 1.7–7.7)
Neutrophils Relative %: 61 %

## 2014-11-19 MED ORDER — DIPHENHYDRAMINE HCL 25 MG PO CAPS: 25.0000 mg | ORAL_CAPSULE | ORAL | Status: DC

## 2014-11-19 MED ORDER — SODIUM CHLORIDE 0.9 % IV SOLN
Freq: Once | INTRAVENOUS | Status: AC
  Administered 2014-11-19: 08:00:00 via INTRAVENOUS

## 2014-11-19 MED ORDER — ACETAMINOPHEN 325 MG PO TABS: 650.0000 mg | ORAL_TABLET | ORAL | Status: DC

## 2014-11-19 MED ORDER — SODIUM CHLORIDE 0.9 % IV SOLN
500.0000 mg | INTRAVENOUS | Status: DC
  Administered 2014-11-19: 500 mg via INTRAVENOUS
  Filled 2014-11-19: qty 20

## 2014-12-18 ENCOUNTER — Other Ambulatory Visit (HOSPITAL_COMMUNITY): Payer: Self-pay | Admitting: *Deleted

## 2014-12-19 ENCOUNTER — Encounter (HOSPITAL_COMMUNITY)
Admission: RE | Admit: 2014-12-19 | Discharge: 2014-12-19 | Disposition: A | Discharge status: Home / Self Care | Payer: BLUE CROSS/BLUE SHIELD | Source: Ambulatory Visit | Attending: Rheumatology | Admitting: Rheumatology

## 2014-12-19 DIAGNOSIS — M069 Rheumatoid arthritis, unspecified: Secondary | ICD-10-CM | POA: Diagnosis present

## 2014-12-19 MED ORDER — SODIUM CHLORIDE 0.9 % IV SOLN
500.0000 mg | INTRAVENOUS | Status: DC
  Administered 2014-12-19: 500 mg via INTRAVENOUS
  Filled 2014-12-19: qty 20

## 2014-12-19 MED ORDER — DIPHENHYDRAMINE HCL 25 MG PO CAPS: 25.0000 mg | ORAL_CAPSULE | ORAL | Status: DC

## 2014-12-19 MED ORDER — ACETAMINOPHEN 325 MG PO TABS: 650.0000 mg | ORAL_TABLET | ORAL | Status: DC

## 2015-01-07 ENCOUNTER — Telehealth: Payer: Self-pay

## 2015-01-07 NOTE — Telephone Encounter (Signed)
Patient notified of [facility] BMD results. Copy will be scanned in epic. Advised patient to continue any calcium/dairy products and vitamin d. States the last time she has taken vitamin d was when we gave her the Rx (this was in 2014, level has not been checked since then). She is also asking is she needs to have an AEX here anymore-last one was 12/19/12 with PG, but has seen Dr Sabra Heck several times and had surgery. Please advise.//kn

## 2015-01-08 NOTE — Telephone Encounter (Signed)
I think she should come this year and then we can decide frequency of future appts.  Sending to triage to call today since there are some available appts next week.

## 2015-01-08 NOTE — Telephone Encounter (Signed)
Message left to return call to Kristy Perez at [phone].    

## 2015-01-21 ENCOUNTER — Encounter (HOSPITAL_COMMUNITY)
Admission: RE | Admit: 2015-01-21 | Discharge: 2015-01-21 | Disposition: A | Discharge status: Home / Self Care | Payer: BLUE CROSS/BLUE SHIELD | Source: Ambulatory Visit | Attending: Rheumatology | Admitting: Rheumatology

## 2015-01-21 DIAGNOSIS — M069 Rheumatoid arthritis, unspecified: Secondary | ICD-10-CM | POA: Insufficient documentation

## 2015-01-21 LAB — CBC WITH DIFFERENTIAL/PLATELET
BASOS ABS: 0 10*3/uL (ref 0.0–0.1)
BASOS PCT: 0 %
EOS ABS: 0.2 10*3/uL (ref 0.0–0.7)
EOS PCT: 3 %
HCT: 36.2 % (ref 36.0–46.0)
Hemoglobin: 11.9 g/dL — ABNORMAL LOW (ref 12.0–15.0)
Lymphocytes Relative: 30 %
Lymphs Abs: 1.9 10*3/uL (ref 0.7–4.0)
MCH: 27.8 pg (ref 26.0–34.0)
MCHC: 32.9 g/dL (ref 30.0–36.0)
MCV: 84.6 fL (ref 78.0–100.0)
MONO ABS: 0.4 10*3/uL (ref 0.1–1.0)
Monocytes Relative: 7 %
Neutro Abs: 3.8 10*3/uL (ref 1.7–7.7)
Neutrophils Relative %: 60 %
PLATELETS: 369 10*3/uL (ref 150–400)
RBC: 4.28 MIL/uL (ref 3.87–5.11)
RDW: 13.6 % (ref 11.5–15.5)
WBC: 6.3 10*3/uL (ref 4.0–10.5)

## 2015-01-21 LAB — COMPREHENSIVE METABOLIC PANEL
ALT: 15 U/L (ref 14–54)
AST: 24 U/L (ref 15–41)
Albumin: 3.3 g/dL — ABNORMAL LOW (ref 3.5–5.0)
Alkaline Phosphatase: 60 U/L (ref 38–126)
Anion gap: 10 (ref 5–15)
BUN: 9 mg/dL (ref 6–20)
CHLORIDE: 106 mmol/L (ref 101–111)
CO2: 24 mmol/L (ref 22–32)
CREATININE: 0.55 mg/dL (ref 0.44–1.00)
Calcium: 9 mg/dL (ref 8.9–10.3)
GFR calc Af Amer: >60 mL/min (ref >60)
Glucose, Bld: 130 mg/dL — ABNORMAL HIGH (ref 65–99)
POTASSIUM: 4.2 mmol/L (ref 3.5–5.1)
SODIUM: 140 mmol/L (ref 135–145)
Total Bilirubin: 1 mg/dL (ref 0.3–1.2)
Total Protein: 5.8 g/dL — ABNORMAL LOW (ref 6.5–8.1)

## 2015-01-21 MED ORDER — SODIUM CHLORIDE 0.9 % IV SOLN
500.0000 mg | INTRAVENOUS | Status: DC
  Administered 2015-01-21: 500 mg via INTRAVENOUS
  Filled 2015-01-21: qty 20

## 2015-01-21 MED ORDER — SODIUM CHLORIDE 0.9 % IV SOLN
INTRAVENOUS | Status: DC
  Administered 2015-01-21: 10:00:00 via INTRAVENOUS

## 2015-01-21 MED ORDER — DIPHENHYDRAMINE HCL 25 MG PO CAPS: 25.0000 mg | ORAL_CAPSULE | ORAL | Status: DC

## 2015-01-21 MED ORDER — ACETAMINOPHEN 325 MG PO TABS: 650.0000 mg | ORAL_TABLET | ORAL | Status: DC

## 2015-01-22 NOTE — Telephone Encounter (Signed)
Call to patient. She is agreeable to schedule annual exam with Dr. Sabra Heck for 01/28/15 at 0900. Will follow up as scheduled. Routing to provider for final review. Patient agreeable to disposition. Will close encounter.

## 2015-01-28 ENCOUNTER — Ambulatory Visit (INDEPENDENT_AMBULATORY_CARE_PROVIDER_SITE_OTHER): Payer: BLUE CROSS/BLUE SHIELD | Admitting: Obstetrics & Gynecology

## 2015-01-28 ENCOUNTER — Encounter: Payer: Self-pay | Admitting: Obstetrics & Gynecology

## 2015-01-28 VITALS — BP 118/66 | HR 70 | Resp 16 | Ht 61.5 in | Wt 131.0 lb

## 2015-01-28 DIAGNOSIS — E559 Vitamin D deficiency, unspecified: Secondary | ICD-10-CM | POA: Diagnosis not present

## 2015-01-28 DIAGNOSIS — Z01419 Encounter for gynecological examination (general) (routine) without abnormal findings: Secondary | ICD-10-CM

## 2015-01-28 DIAGNOSIS — Z124 Encounter for screening for malignant neoplasm of cervix: Secondary | ICD-10-CM | POA: Diagnosis not present

## 2015-01-28 MED ORDER — ESTRADIOL 0.1 MG/GM VA CREA: TOPICAL_CREAM | VAGINAL | Status: DC

## 2015-01-28 MED ORDER — VITAMIN D (ERGOCALCIFEROL) 1.25 MG (50000 UNIT) PO CAPS: 50000.0000 [IU] | ORAL_CAPSULE | ORAL | Status: DC

## 2015-01-28 NOTE — Progress Notes (Signed)
64 y.o. [id number] DivorcedCaucasianF here for annual exam.  Doing well.  No vaginal bleeding.  Has follow up breast ultrasound appointment scheduled in December.    Denies vaginal bleeding.  PCP:  Dr. Marlyn Corporal.  Is seen every six months. Rheumatologist:  Dr. Estanislado Pandy.  Is seen every six to eight weeks.  No LMP recorded. Patient has had a hysterectomy.          Sexually active: No.  The current method of family planning is post menopausal status.    Exercising: No.  The patient does not participate in regular exercise at present. Smoker:  Former  Health Maintenance: Pap:  05/2009 Normal  History of abnormal Pap:  yes MMG:  09/13/14 Left [place] BIRADS3: Probably benign  Colonoscopy:  01/02/14, Dr. Collene Mares. Follow up 5 years BMD:   12/25/14 Improved  TDaP:  UTD Screening Labs: PCP, Hb today: PCP, Urine today: PCP   reports that she quit smoking about 15 years ago. Her smoking use included Cigarettes. She has never used smokeless tobacco. She reports that she does not drink alcohol or use illicit drugs.  Past Medical History  Diagnosis Date  . Cancer [facility])     breast, left  . Rheumatoid arthritis (HCC)     on Humira  . Chiari malformation   . Asthma     inhaler one x per day  . PONV (postoperative nausea and vomiting)     Past Surgical History  Procedure Laterality Date  . Total vaginal hysterectomy  1989    with AP repair  . Colporrhaphy    . Breast surgery Left 2003    Lumpectomy T1C1, NO and negative ER/ PR  . Pelvic floor repair  3/04    SPARC with AP colporrhaphy  . Colonoscopy  10/04  . Colonoscopy  11/10    normal recheck in 5 years  . Esophagogastroduodenoscopy endoscopy  11/10    GERD  . Foot surgery Left age 70    removal of pseudo rheumatoid nodules from left forefoot.  . Laparoscopic bilateral salpingo oopherectomy N/A 07/30/2013    Procedure: LAPAROSCOPIC BILATERAL SALPINGO OOPHORECTOMY WITH WASHINGS;  Surgeon: Lyman Speller, MD;  Location: [facility];  Service:  Gynecology;  Laterality: N/A;  . Nasal septoplasty w/ turbinoplasty Bilateral 05/24/2014    Procedure: NASAL SEPTOPLASTY WITH  BILATERAL TURBINATE REDUCTION;  Surgeon: Jerrell Belfast, MD;  Location: [facility];  Service: ENT;  Laterality: Bilateral;    Family History  Problem Relation Age of Onset  . Diabetes Mother   . Heart failure Mother   . Bladder Cancer Mother   . Stroke Mother   . Uterine cancer Maternal Aunt   . Bladder Cancer Maternal Grandmother   . Colon cancer Maternal Grandmother   . Cancer Father     head and neck    ROS:  Pertinent items are noted in HPI.  Otherwise, a comprehensive ROS was negative.  Exam:   BP 118/66 mmHg  Pulse 70  Resp 16  Ht 5' 1.5" (1.562 m)  Wt 131 lb (59.421 kg)  BMI 24.35 kg/m2    Height: 5' 1.5" (156.2 cm)  Ht Readings from Last 3 Encounters:  01/28/15 5' 1.5" (1.562 m)  01/21/15 5\' 2"  (1.575 m)  12/19/14 5\' 2"  (1.575 m)    General appearance: alert, cooperative and appears stated age Head: Normocephalic, without obvious abnormality, atraumatic Neck: no adenopathy, supple, symmetrical, trachea midline and thyroid normal to inspection and palpation Breasts: normal appearance, no masses or tenderness, two well  healed scars left breast Heart: regular rate and rhythm Abdomen: soft, non-tender; bowel sounds normal; no masses,  no organomegaly Extremities: extremities normal, atraumatic, no cyanosis or edema Skin: Skin color, texture, turgor normal. No rashes or lesions Lymph nodes: Cervical, supraclavicular, and axillary nodes normal. No abnormal inguinal nodes palpated Neurologic: Grossly normal   Pelvic: External genitalia:  no lesions              Urethra:  normal appearing urethra with no masses, tenderness or lesions              Bartholins and Skenes: normal                 Vagina: normal appearing vagina with normal color and discharge, no lesions              Cervix: absent              Pap taken: Yes.   Bimanual Exam:   Uterus:  uterus absent              Adnexa: no mass, fullness, tenderness               Rectovaginal: Confirms               Anus:  normal sphincter tone, no lesions  Chaperone was present for exam.  A:  Well Woman with normal exam S/P left breast cancer 6/03 with ER/PR negative Urethral prolapse using vaginal estrogen externally prn RA S/P TVH with AP repair 1989, s/p laparoscopic BSO due to right serous cystadenoma Osteoporosisstable at 2016 Vit D deficiency  P: Pap smear obtained Mammogram follow up scheduled 12/16 Vit D 50K weekly to pharmacy.  Pt will have repeat level done with next labs.  Order placed and given. Refill on Estrace vaginal cream to use pea size to the urethra only prn. Pt will return for AEX two years.

## 2015-01-28 NOTE — Addendum Note (Signed)
Addended by: Elroy Channel on: 01/28/2015 01:28 PM   Modules accepted: Orders, SmartSet

## 2015-01-29 LAB — IPS PAP TEST WITH REFLEX TO HPV

## 2015-02-02 ENCOUNTER — Other Ambulatory Visit: Payer: Self-pay | Admitting: Obstetrics & Gynecology

## 2015-02-02 DIAGNOSIS — E559 Vitamin D deficiency, unspecified: Secondary | ICD-10-CM

## 2015-02-06 ENCOUNTER — Telehealth: Payer: Self-pay | Admitting: Emergency Medicine

## 2015-02-06 NOTE — Telephone Encounter (Signed)
Dr. Sabra Heck,  Received denial from [facility] for Estrace Vaginal Cream. Formulary alternatives are Premarin vaginal cream and Vagifem.

## 2015-02-06 NOTE — Telephone Encounter (Signed)
Message left to return call to [place] at [phone].   Patient has tried E-string in 04/2002 then changed from [place] to Vagifem in 06/2002. Patient was then changed from Vagifem to Estrace 09/19/2007 due to increase in atrophic changes.   No previous prior authorizations are scanned. Will discuss with patient.  [employer]. Patient has last picked up Estrace 11/2013.

## 2015-02-13 MED ORDER — ESTROGENS, CONJUGATED 0.625 MG/GM VA CREA: TOPICAL_CREAM | VAGINAL | Status: DC

## 2015-02-13 NOTE — Telephone Encounter (Addendum)
-----   Message from Megan Salon, MD sent at 02/07/2015  1:54 PM EST ----- Regarding: RE: Estrace Prior auth OK to order premarin vaginal cream.  Thanks.  MSM

## 2015-02-13 NOTE — Telephone Encounter (Signed)
Patient returned call. She is aware that [facility] is not covering Estrace cream.  She is agreeable to changing to Premarin vaginal cream. Will use 1/2 gram PV at night twice per week. She is advised to return call with any concerns or problems with prescription or coverage and is agreeable.  Routing to provider for final review. Patient agreeable to disposition. Will close encounter.

## 2015-02-14 ENCOUNTER — Other Ambulatory Visit (HOSPITAL_COMMUNITY): Payer: Self-pay | Admitting: *Deleted

## 2015-02-18 ENCOUNTER — Ambulatory Visit (HOSPITAL_COMMUNITY): Payer: BLUE CROSS/BLUE SHIELD | Attending: Rheumatology

## 2015-02-26 ENCOUNTER — Other Ambulatory Visit (HOSPITAL_COMMUNITY): Payer: Self-pay | Admitting: *Deleted

## 2015-02-27 ENCOUNTER — Ambulatory Visit (HOSPITAL_COMMUNITY)
Admission: RE | Admit: 2015-02-27 | Discharge: 2015-02-27 | Disposition: A | Discharge status: Home / Self Care | Payer: BLUE CROSS/BLUE SHIELD | Source: Ambulatory Visit | Attending: Rheumatology | Admitting: Rheumatology

## 2015-02-27 DIAGNOSIS — M069 Rheumatoid arthritis, unspecified: Secondary | ICD-10-CM | POA: Insufficient documentation

## 2015-02-27 MED ORDER — SODIUM CHLORIDE 0.9 % IV SOLN
INTRAVENOUS | Status: DC
  Administered 2015-02-27: 10:00:00 via INTRAVENOUS

## 2015-02-27 MED ORDER — DIPHENHYDRAMINE HCL 25 MG PO CAPS: 25.0000 mg | ORAL_CAPSULE | ORAL | Status: DC

## 2015-02-27 MED ORDER — ACETAMINOPHEN 325 MG PO TABS: 650.0000 mg | ORAL_TABLET | ORAL | Status: DC

## 2015-02-27 MED ORDER — SODIUM CHLORIDE 0.9 % IV SOLN
500.0000 mg | INTRAVENOUS | Status: DC
  Administered 2015-02-27: 500 mg via INTRAVENOUS
  Filled 2015-02-27: qty 20

## 2015-03-26 ENCOUNTER — Other Ambulatory Visit (HOSPITAL_COMMUNITY): Payer: Self-pay | Admitting: *Deleted

## 2015-03-27 ENCOUNTER — Ambulatory Visit (HOSPITAL_COMMUNITY)
Admission: RE | Admit: 2015-03-27 | Discharge: 2015-03-27 | Disposition: A | Discharge status: Home / Self Care | Payer: BLUE CROSS/BLUE SHIELD | Source: Ambulatory Visit | Attending: Rheumatology | Admitting: Rheumatology

## 2015-03-27 DIAGNOSIS — M069 Rheumatoid arthritis, unspecified: Secondary | ICD-10-CM | POA: Diagnosis not present

## 2015-03-27 LAB — COMPREHENSIVE METABOLIC PANEL WITH GFR
ALT: 16 U/L (ref 14–54)
AST: 21 U/L (ref 15–41)
Albumin: 3.8 g/dL (ref 3.5–5.0)
Alkaline Phosphatase: 57 U/L (ref 38–126)
Anion gap: 9 (ref 5–15)
BUN: 13 mg/dL (ref 6–20)
CO2: 23 mmol/L (ref 22–32)
Calcium: 8.9 mg/dL (ref 8.9–10.3)
Chloride: 107 mmol/L (ref 101–111)
Creatinine, Ser: 0.53 mg/dL (ref 0.44–1.00)
GFR calc Af Amer: >60 mL/min
GFR calc non Af Amer: >60 mL/min
Glucose, Bld: 100 mg/dL — ABNORMAL HIGH (ref 65–99)
Potassium: 3.7 mmol/L (ref 3.5–5.1)
Sodium: 139 mmol/L (ref 135–145)
Total Bilirubin: 0.7 mg/dL (ref 0.3–1.2)
Total Protein: 6.3 g/dL — ABNORMAL LOW (ref 6.5–8.1)

## 2015-03-27 LAB — DIFFERENTIAL
Basophils Absolute: 0 K/uL (ref 0.0–0.1)
Basophils Relative: 0 %
Eosinophils Absolute: 0.3 K/uL (ref 0.0–0.7)
Eosinophils Relative: 4 %
Lymphocytes Relative: 31 %
Lymphs Abs: 2.1 K/uL (ref 0.7–4.0)
Monocytes Absolute: 0.7 K/uL (ref 0.1–1.0)
Monocytes Relative: 10 %
Neutro Abs: 3.7 K/uL (ref 1.7–7.7)
Neutrophils Relative %: 55 %

## 2015-03-27 LAB — CBC
HCT: 35.4 % — ABNORMAL LOW (ref 36.0–46.0)
Hemoglobin: 12.2 g/dL (ref 12.0–15.0)
MCH: 29 pg (ref 26.0–34.0)
MCHC: 34.5 g/dL (ref 30.0–36.0)
MCV: 84.1 fL (ref 78.0–100.0)
Platelets: 333 K/uL (ref 150–400)
RBC: 4.21 MIL/uL (ref 3.87–5.11)
RDW: 13.4 % (ref 11.5–15.5)
WBC: 6.7 K/uL (ref 4.0–10.5)

## 2015-03-27 MED ORDER — DIPHENHYDRAMINE HCL 25 MG PO CAPS: 25.0000 mg | ORAL_CAPSULE | ORAL | Status: DC

## 2015-03-27 MED ORDER — ABATACEPT 250 MG IV SOLR
500.0000 mg | INTRAVENOUS | Status: DC
  Administered 2015-03-27: 500 mg via INTRAVENOUS
  Filled 2015-03-27: qty 20

## 2015-03-27 MED ORDER — SODIUM CHLORIDE 0.9 % IV SOLN
INTRAVENOUS | Status: DC
  Administered 2015-03-27: 10:00:00 via INTRAVENOUS

## 2015-03-27 MED ORDER — ACETAMINOPHEN 325 MG PO TABS: 650.0000 mg | ORAL_TABLET | ORAL | Status: DC

## 2015-04-05 ENCOUNTER — Telehealth: Payer: Self-pay | Admitting: Obstetrics & Gynecology

## 2015-04-05 NOTE — Telephone Encounter (Signed)
Pt was to have VIt D lab done with last labs at cancer center.  Just wanted her to know it wasn't done.  I don't think she needs to come in for the lab.  She does have a prescription for Vit D.  Can plan to test next time she's in the office.

## 2015-04-07 NOTE — Telephone Encounter (Signed)
Spoke with patient. Advised of message as seen below from Dr.Miller. She is agreeable and verbalizes understanding.  Routing to provider for final review. Patient agreeable to disposition. Will close encounter.  

## 2015-04-24 ENCOUNTER — Ambulatory Visit (HOSPITAL_COMMUNITY): Payer: BLUE CROSS/BLUE SHIELD

## 2015-05-01 DIAGNOSIS — J449 Chronic obstructive pulmonary disease, unspecified: Secondary | ICD-10-CM | POA: Insufficient documentation

## 2015-05-01 DIAGNOSIS — M3500 Sicca syndrome, unspecified: Secondary | ICD-10-CM | POA: Insufficient documentation

## 2015-05-01 DIAGNOSIS — K219 Gastro-esophageal reflux disease without esophagitis: Secondary | ICD-10-CM | POA: Insufficient documentation

## 2015-05-01 DIAGNOSIS — F419 Anxiety disorder, unspecified: Secondary | ICD-10-CM | POA: Insufficient documentation

## 2015-06-10 ENCOUNTER — Other Ambulatory Visit (HOSPITAL_COMMUNITY): Payer: Self-pay | Admitting: *Deleted

## 2015-06-11 ENCOUNTER — Ambulatory Visit (HOSPITAL_COMMUNITY)
Admission: RE | Admit: 2015-06-11 | Discharge: 2015-06-11 | Disposition: A | Discharge status: Home / Self Care | Payer: BLUE CROSS/BLUE SHIELD | Source: Ambulatory Visit | Attending: Rheumatology | Admitting: Rheumatology

## 2015-06-11 DIAGNOSIS — M069 Rheumatoid arthritis, unspecified: Secondary | ICD-10-CM | POA: Diagnosis not present

## 2015-06-11 LAB — COMPREHENSIVE METABOLIC PANEL
ALK PHOS: 59 U/L (ref 38–126)
ALT: 16 U/L (ref 14–54)
AST: 21 U/L (ref 15–41)
Albumin: 3.9 g/dL (ref 3.5–5.0)
Anion gap: 11 (ref 5–15)
BILIRUBIN TOTAL: 0.9 mg/dL (ref 0.3–1.2)
BUN: 9 mg/dL (ref 6–20)
CALCIUM: 9.2 mg/dL (ref 8.9–10.3)
CO2: 23 mmol/L (ref 22–32)
CREATININE: 0.58 mg/dL (ref 0.44–1.00)
Chloride: 105 mmol/L (ref 101–111)
Glucose, Bld: 110 mg/dL — ABNORMAL HIGH (ref 65–99)
Potassium: 3.8 mmol/L (ref 3.5–5.1)
Sodium: 139 mmol/L (ref 135–145)
TOTAL PROTEIN: 6.5 g/dL (ref 6.5–8.1)

## 2015-06-11 LAB — CBC
HCT: 36.9 % (ref 36.0–46.0)
HEMOGLOBIN: 12.3 g/dL (ref 12.0–15.0)
MCH: 27.6 pg (ref 26.0–34.0)
MCHC: 33.3 g/dL (ref 30.0–36.0)
MCV: 82.7 fL (ref 78.0–100.0)
Platelets: 321 10*3/uL (ref 150–400)
RBC: 4.46 MIL/uL (ref 3.87–5.11)
RDW: 13.1 % (ref 11.5–15.5)
WBC: 7.5 10*3/uL (ref 4.0–10.5)

## 2015-06-11 LAB — DIFFERENTIAL
BASOS ABS: 0 10*3/uL (ref 0.0–0.1)
BASOS PCT: 0 %
EOS ABS: 0.4 10*3/uL (ref 0.0–0.7)
Eosinophils Relative: 5 %
Lymphocytes Relative: 30 %
Lymphs Abs: 2.3 10*3/uL (ref 0.7–4.0)
MONOS PCT: 10 %
Monocytes Absolute: 0.7 10*3/uL (ref 0.1–1.0)
NEUTROS PCT: 55 %
Neutro Abs: 4.1 10*3/uL (ref 1.7–7.7)

## 2015-06-11 MED ORDER — DIPHENHYDRAMINE HCL 25 MG PO CAPS: 25.0000 mg | ORAL_CAPSULE | ORAL | Status: DC

## 2015-06-11 MED ORDER — SODIUM CHLORIDE 0.9 % IV SOLN
INTRAVENOUS | Status: DC
  Administered 2015-06-11: 10:00:00 via INTRAVENOUS

## 2015-06-11 MED ORDER — SODIUM CHLORIDE 0.9 % IV SOLN
500.0000 mg | INTRAVENOUS | Status: DC
  Administered 2015-06-11: 500 mg via INTRAVENOUS
  Filled 2015-06-11: qty 20

## 2015-06-11 MED ORDER — ACETAMINOPHEN 325 MG PO TABS: 650.0000 mg | ORAL_TABLET | ORAL | Status: DC

## 2015-06-13 LAB — QUANTIFERON IN TUBE
QFT TB AG MINUS NIL VALUE: 0 IU/mL
QUANTIFERON MITOGEN VALUE: 7.75 IU/mL
QUANTIFERON TB AG VALUE: 0.01 IU/mL
QUANTIFERON TB GOLD: NEGATIVE
Quantiferon Nil Value: 0.01 IU/mL

## 2015-06-13 LAB — QUANTIFERON TB GOLD ASSAY (BLOOD)

## 2015-07-10 ENCOUNTER — Encounter (HOSPITAL_COMMUNITY)
Admission: RE | Admit: 2015-07-10 | Discharge: 2015-07-10 | Disposition: A | Discharge status: Home / Self Care | Payer: BLUE CROSS/BLUE SHIELD | Source: Ambulatory Visit | Attending: Rheumatology | Admitting: Rheumatology

## 2015-07-10 DIAGNOSIS — M069 Rheumatoid arthritis, unspecified: Secondary | ICD-10-CM | POA: Diagnosis not present

## 2015-07-10 MED ORDER — ACETAMINOPHEN 325 MG PO TABS: 650.0000 mg | ORAL_TABLET | ORAL | Status: DC

## 2015-07-10 MED ORDER — DIPHENHYDRAMINE HCL 25 MG PO CAPS: 25.0000 mg | ORAL_CAPSULE | ORAL | Status: DC

## 2015-07-10 MED ORDER — SODIUM CHLORIDE 0.9 % IV SOLN
INTRAVENOUS | Status: DC
  Administered 2015-07-10: 10:00:00 via INTRAVENOUS

## 2015-07-10 MED ORDER — ABATACEPT 250 MG IV SOLR
500.0000 mg | INTRAVENOUS | Status: DC
  Administered 2015-07-10: 500 mg via INTRAVENOUS
  Filled 2015-07-10: qty 20

## 2015-08-05 ENCOUNTER — Other Ambulatory Visit (HOSPITAL_COMMUNITY): Payer: Self-pay | Admitting: *Deleted

## 2015-08-06 ENCOUNTER — Encounter (HOSPITAL_COMMUNITY)
Admission: RE | Admit: 2015-08-06 | Discharge: 2015-08-06 | Disposition: A | Discharge status: Home / Self Care | Payer: BLUE CROSS/BLUE SHIELD | Source: Ambulatory Visit | Attending: Rheumatology | Admitting: Rheumatology

## 2015-08-06 DIAGNOSIS — M069 Rheumatoid arthritis, unspecified: Secondary | ICD-10-CM | POA: Diagnosis not present

## 2015-08-06 LAB — COMPREHENSIVE METABOLIC PANEL
ALBUMIN: 3.6 g/dL (ref 3.5–5.0)
ALK PHOS: 61 U/L (ref 38–126)
ALT: 15 U/L (ref 14–54)
AST: 20 U/L (ref 15–41)
Anion gap: 8 (ref 5–15)
BUN: 7 mg/dL (ref 6–20)
CALCIUM: 9.2 mg/dL (ref 8.9–10.3)
CO2: 23 mmol/L (ref 22–32)
CREATININE: 0.54 mg/dL (ref 0.44–1.00)
Chloride: 106 mmol/L (ref 101–111)
GFR calc Af Amer: >60 mL/min (ref >60)
GFR calc non Af Amer: >60 mL/min (ref >60)
GLUCOSE: 127 mg/dL — AB (ref 65–99)
Potassium: 4 mmol/L (ref 3.5–5.1)
SODIUM: 137 mmol/L (ref 135–145)
Total Bilirubin: 0.4 mg/dL (ref 0.3–1.2)
Total Protein: 6.2 g/dL — ABNORMAL LOW (ref 6.5–8.1)

## 2015-08-06 LAB — CBC
HCT: 35.7 % — ABNORMAL LOW (ref 36.0–46.0)
HEMOGLOBIN: 11.9 g/dL — AB (ref 12.0–15.0)
MCH: 27 pg (ref 26.0–34.0)
MCHC: 33.3 g/dL (ref 30.0–36.0)
MCV: 81.1 fL (ref 78.0–100.0)
Platelets: 411 10*3/uL — ABNORMAL HIGH (ref 150–400)
RBC: 4.4 MIL/uL (ref 3.87–5.11)
RDW: 13 % (ref 11.5–15.5)
WBC: 7.2 10*3/uL (ref 4.0–10.5)

## 2015-08-06 LAB — DIFFERENTIAL
BASOS PCT: 0 %
Basophils Absolute: 0 10*3/uL (ref 0.0–0.1)
EOS ABS: 0.3 10*3/uL (ref 0.0–0.7)
EOS PCT: 4 %
Lymphocytes Relative: 31 %
Lymphs Abs: 2.2 10*3/uL (ref 0.7–4.0)
MONO ABS: 0.6 10*3/uL (ref 0.1–1.0)
MONOS PCT: 8 %
Neutro Abs: 4.1 10*3/uL (ref 1.7–7.7)
Neutrophils Relative %: 57 %

## 2015-08-06 MED ORDER — DIPHENHYDRAMINE HCL 25 MG PO CAPS: 25.0000 mg | ORAL_CAPSULE | ORAL | Status: DC

## 2015-08-06 MED ORDER — SODIUM CHLORIDE 0.9 % IV SOLN
INTRAVENOUS | Status: DC
  Administered 2015-08-06: 10:00:00 via INTRAVENOUS

## 2015-08-06 MED ORDER — ACETAMINOPHEN 325 MG PO TABS: 650.0000 mg | ORAL_TABLET | ORAL | Status: DC

## 2015-08-06 MED ORDER — SODIUM CHLORIDE 0.9 % IV SOLN
500.0000 mg | INTRAVENOUS | Status: DC
  Administered 2015-08-06: 500 mg via INTRAVENOUS
  Filled 2015-08-06: qty 20

## 2015-08-06 MED ORDER — SODIUM CHLORIDE 0.9 % IV SOLN: 500.0000 mg | INTRAVENOUS | Status: DC

## 2015-09-03 ENCOUNTER — Encounter (HOSPITAL_COMMUNITY): Payer: BLUE CROSS/BLUE SHIELD

## 2015-09-10 ENCOUNTER — Encounter (HOSPITAL_COMMUNITY)
Admission: RE | Admit: 2015-09-10 | Discharge: 2015-09-10 | Disposition: A | Discharge status: Home / Self Care | Payer: BLUE CROSS/BLUE SHIELD | Source: Ambulatory Visit | Attending: Rheumatology | Admitting: Rheumatology

## 2015-09-10 DIAGNOSIS — M069 Rheumatoid arthritis, unspecified: Secondary | ICD-10-CM | POA: Diagnosis present

## 2015-09-10 MED ORDER — SODIUM CHLORIDE 0.9 % IV SOLN
500.0000 mg | INTRAVENOUS | Status: DC
  Administered 2015-09-10: 500 mg via INTRAVENOUS
  Filled 2015-09-10: qty 20

## 2015-09-10 MED ORDER — SODIUM CHLORIDE 0.9 % IV SOLN
INTRAVENOUS | Status: DC
  Administered 2015-09-10: 10:00:00 via INTRAVENOUS

## 2015-09-10 MED ORDER — DIPHENHYDRAMINE HCL 25 MG PO CAPS: 25.0000 mg | ORAL_CAPSULE | ORAL | Status: DC

## 2015-09-10 MED ORDER — ACETAMINOPHEN 325 MG PO TABS: 650.0000 mg | ORAL_TABLET | ORAL | Status: DC

## 2015-09-30 ENCOUNTER — Telehealth: Payer: Self-pay | Admitting: *Deleted

## 2015-09-30 NOTE — Telephone Encounter (Signed)
Patient is in 04 recall for 08/2015. She is needing a repeat DX MMG/U/S. Please contact patient Thanks Margaretha Sheffield

## 2015-10-03 NOTE — Telephone Encounter (Signed)
Called patient to follow up about repeat Dx MMG/US. Patient states she was unaware she needed another MMG/US. RN stated to patient she was due for a follow up in 07/17. RN offered to call [facility] to schedule for her, but patient declined stating, "I just don't understand why I need another." Patient states she will call [facility] herself to make her appointment and follow up. RN stated to call our office with any questions.

## 2015-10-06 NOTE — Telephone Encounter (Signed)
Will extend recall for 1 month to give patient time to schedule-eh

## 2015-10-08 ENCOUNTER — Encounter (HOSPITAL_COMMUNITY)
Admission: RE | Admit: 2015-10-08 | Discharge: 2015-10-08 | Disposition: A | Discharge status: Home / Self Care | Payer: BLUE CROSS/BLUE SHIELD | Source: Ambulatory Visit | Attending: Rheumatology | Admitting: Rheumatology

## 2015-10-08 ENCOUNTER — Encounter (HOSPITAL_COMMUNITY): Payer: Self-pay

## 2015-10-08 DIAGNOSIS — M069 Rheumatoid arthritis, unspecified: Secondary | ICD-10-CM | POA: Diagnosis present

## 2015-10-08 LAB — CBC WITH DIFFERENTIAL/PLATELET
BASOS PCT: 0 %
Basophils Absolute: 0 10*3/uL (ref 0.0–0.1)
EOS ABS: 0.2 10*3/uL (ref 0.0–0.7)
EOS PCT: 3 %
HCT: 35.8 % — ABNORMAL LOW (ref 36.0–46.0)
Hemoglobin: 11.8 g/dL — ABNORMAL LOW (ref 12.0–15.0)
LYMPHS ABS: 2.1 10*3/uL (ref 0.7–4.0)
Lymphocytes Relative: 28 %
MCH: 27.5 pg (ref 26.0–34.0)
MCHC: 33 g/dL (ref 30.0–36.0)
MCV: 83.4 fL (ref 78.0–100.0)
Monocytes Absolute: 0.6 10*3/uL (ref 0.1–1.0)
Monocytes Relative: 8 %
Neutro Abs: 4.6 10*3/uL (ref 1.7–7.7)
Neutrophils Relative %: 61 %
PLATELETS: 339 10*3/uL (ref 150–400)
RBC: 4.29 MIL/uL (ref 3.87–5.11)
RDW: 13.4 % (ref 11.5–15.5)
WBC: 7.6 10*3/uL (ref 4.0–10.5)

## 2015-10-08 LAB — COMPREHENSIVE METABOLIC PANEL
ALBUMIN: 3.6 g/dL (ref 3.5–5.0)
ALT: 20 U/L (ref 14–54)
ANION GAP: 6 (ref 5–15)
AST: 23 U/L (ref 15–41)
Alkaline Phosphatase: 58 U/L (ref 38–126)
BUN: 9 mg/dL (ref 6–20)
CHLORIDE: 105 mmol/L (ref 101–111)
CO2: 24 mmol/L (ref 22–32)
CREATININE: 0.52 mg/dL (ref 0.44–1.00)
Calcium: 8.8 mg/dL — ABNORMAL LOW (ref 8.9–10.3)
GFR calc non Af Amer: >60 mL/min (ref >60)
GLUCOSE: 98 mg/dL (ref 65–99)
Potassium: 4.1 mmol/L (ref 3.5–5.1)
SODIUM: 135 mmol/L (ref 135–145)
Total Bilirubin: 0.6 mg/dL (ref 0.3–1.2)
Total Protein: 5.9 g/dL — ABNORMAL LOW (ref 6.5–8.1)

## 2015-10-08 MED ORDER — ACETAMINOPHEN 325 MG PO TABS: 650.0000 mg | ORAL_TABLET | ORAL | Status: DC

## 2015-10-08 MED ORDER — SODIUM CHLORIDE 0.9 % IV SOLN
INTRAVENOUS | Status: DC
  Administered 2015-10-08: 10:00:00 via INTRAVENOUS

## 2015-10-08 MED ORDER — SODIUM CHLORIDE 0.9 % IV SOLN
500.0000 mg | INTRAVENOUS | Status: DC
  Administered 2015-10-08: 500 mg via INTRAVENOUS
  Filled 2015-10-08: qty 20

## 2015-10-08 MED ORDER — DIPHENHYDRAMINE HCL 25 MG PO CAPS: 25.0000 mg | ORAL_CAPSULE | ORAL | Status: DC

## 2015-10-20 ENCOUNTER — Encounter: Payer: Self-pay | Admitting: Radiology

## 2015-10-20 DIAGNOSIS — M0579 Rheumatoid arthritis with rheumatoid factor of multiple sites without organ or systems involvement: Secondary | ICD-10-CM

## 2015-10-21 ENCOUNTER — Telehealth: Payer: Self-pay | Admitting: *Deleted

## 2015-10-21 NOTE — Telephone Encounter (Signed)
Patient was in 04 recall for 08/2015. She was contacted and stated that she would schedule her DX mammogram. Recall was extended for 1 month to give her time to schedule. Please follow up with patient to be sure she went  Thanks Margaretha Sheffield

## 2015-10-22 NOTE — Telephone Encounter (Signed)
Call to patient. Patient states that her MMG is scheduled for tomorrow, 10/23/15 at [place].

## 2015-11-02 ENCOUNTER — Encounter (HOSPITAL_COMMUNITY): Payer: Self-pay | Admitting: *Deleted

## 2015-11-05 ENCOUNTER — Encounter (HOSPITAL_COMMUNITY)
Admission: RE | Admit: 2015-11-05 | Discharge: 2015-11-05 | Disposition: A | Discharge status: Home / Self Care | Payer: BLUE CROSS/BLUE SHIELD | Source: Ambulatory Visit | Attending: Rheumatology | Admitting: Rheumatology

## 2015-11-05 DIAGNOSIS — M0579 Rheumatoid arthritis with rheumatoid factor of multiple sites without organ or systems involvement: Secondary | ICD-10-CM | POA: Insufficient documentation

## 2015-11-05 MED ORDER — SODIUM CHLORIDE 0.9 % IV SOLN
500.0000 mg | INTRAVENOUS | Status: DC
  Administered 2015-11-05: 500 mg via INTRAVENOUS
  Filled 2015-11-05: qty 10

## 2015-11-05 MED ORDER — ACETAMINOPHEN 325 MG PO TABS: 650.0000 mg | ORAL_TABLET | ORAL | Status: DC

## 2015-11-05 MED ORDER — DIPHENHYDRAMINE HCL 25 MG PO CAPS: 25.0000 mg | ORAL_CAPSULE | ORAL | Status: DC

## 2015-11-05 MED ORDER — SODIUM CHLORIDE 0.9 % IV SOLN
INTRAVENOUS | Status: DC
  Administered 2015-11-05: 10:00:00 via INTRAVENOUS

## 2015-11-05 NOTE — Progress Notes (Signed)
Pt tolerated orencia infusion well, VSS at DC, DC home without complaint.

## 2015-11-11 ENCOUNTER — Encounter: Payer: Self-pay | Admitting: Obstetrics & Gynecology

## 2015-11-11 NOTE — Telephone Encounter (Signed)
Waiting for Mammogram results- will extend recall till October -eh

## 2015-12-03 ENCOUNTER — Encounter (HOSPITAL_COMMUNITY): Payer: BLUE CROSS/BLUE SHIELD

## 2015-12-09 ENCOUNTER — Encounter (HOSPITAL_COMMUNITY)
Admission: RE | Admit: 2015-12-09 | Discharge: 2015-12-09 | Disposition: A | Discharge status: Home / Self Care | Payer: BLUE CROSS/BLUE SHIELD | Source: Ambulatory Visit | Attending: Rheumatology | Admitting: Rheumatology

## 2015-12-09 DIAGNOSIS — M069 Rheumatoid arthritis, unspecified: Secondary | ICD-10-CM | POA: Insufficient documentation

## 2015-12-09 LAB — CBC WITH DIFFERENTIAL/PLATELET
BASOS ABS: 0 10*3/uL (ref 0.0–0.1)
BASOS PCT: 0 %
Eosinophils Absolute: 0.3 10*3/uL (ref 0.0–0.7)
Eosinophils Relative: 4 %
HEMATOCRIT: 36.6 % (ref 36.0–46.0)
Hemoglobin: 12.4 g/dL (ref 12.0–15.0)
LYMPHS PCT: 29 %
Lymphs Abs: 2 10*3/uL (ref 0.7–4.0)
MCH: 28.2 pg (ref 26.0–34.0)
MCHC: 33.9 g/dL (ref 30.0–36.0)
MCV: 83.4 fL (ref 78.0–100.0)
MONO ABS: 0.5 10*3/uL (ref 0.1–1.0)
Monocytes Relative: 8 %
NEUTROS ABS: 4.1 10*3/uL (ref 1.7–7.7)
NEUTROS PCT: 59 %
Platelets: 368 10*3/uL (ref 150–400)
RBC: 4.39 MIL/uL (ref 3.87–5.11)
RDW: 13.3 % (ref 11.5–15.5)
WBC: 7 10*3/uL (ref 4.0–10.5)

## 2015-12-09 MED ORDER — SODIUM CHLORIDE 0.9 % IV SOLN
500.0000 mg | INTRAVENOUS | Status: AC
  Administered 2015-12-09: 500 mg via INTRAVENOUS
  Filled 2015-12-09: qty 20

## 2015-12-09 MED ORDER — SODIUM CHLORIDE 0.9 % IV SOLN
INTRAVENOUS | Status: DC
  Administered 2015-12-09: 09:00:00 via INTRAVENOUS

## 2015-12-09 MED ORDER — ACETAMINOPHEN 325 MG PO TABS: 650.0000 mg | ORAL_TABLET | ORAL | Status: DC

## 2015-12-09 MED ORDER — DIPHENHYDRAMINE HCL 25 MG PO CAPS: 25.0000 mg | ORAL_CAPSULE | ORAL | Status: DC

## 2015-12-26 ENCOUNTER — Other Ambulatory Visit (HOSPITAL_COMMUNITY): Payer: Self-pay | Admitting: *Deleted

## 2015-12-29 ENCOUNTER — Telehealth: Payer: Self-pay | Admitting: Radiology

## 2015-12-29 ENCOUNTER — Encounter (HOSPITAL_COMMUNITY)
Admission: RE | Admit: 2015-12-29 | Discharge: 2015-12-29 | Disposition: A | Discharge status: Home / Self Care | Payer: BLUE CROSS/BLUE SHIELD | Source: Ambulatory Visit | Attending: Rheumatology | Admitting: Rheumatology

## 2015-12-29 DIAGNOSIS — M069 Rheumatoid arthritis, unspecified: Secondary | ICD-10-CM | POA: Diagnosis present

## 2015-12-29 LAB — COMPREHENSIVE METABOLIC PANEL
ALT: 18 U/L (ref 14–54)
AST: 25 U/L (ref 15–41)
Albumin: 3.8 g/dL (ref 3.5–5.0)
Alkaline Phosphatase: 61 U/L (ref 38–126)
Anion gap: 9 (ref 5–15)
BUN: 11 mg/dL (ref 6–20)
CHLORIDE: 104 mmol/L (ref 101–111)
CO2: 22 mmol/L (ref 22–32)
Calcium: 8.7 mg/dL — ABNORMAL LOW (ref 8.9–10.3)
Creatinine, Ser: 0.56 mg/dL (ref 0.44–1.00)
Glucose, Bld: 111 mg/dL — ABNORMAL HIGH (ref 65–99)
POTASSIUM: 4.4 mmol/L (ref 3.5–5.1)
Sodium: 135 mmol/L (ref 135–145)
Total Bilirubin: 0.8 mg/dL (ref 0.3–1.2)
Total Protein: 6.3 g/dL — ABNORMAL LOW (ref 6.5–8.1)

## 2015-12-29 MED ORDER — SODIUM CHLORIDE 0.9 % IV SOLN
INTRAVENOUS | Status: DC
  Administered 2015-12-29: 10:00:00 via INTRAVENOUS

## 2015-12-29 MED ORDER — ACETAMINOPHEN 325 MG PO TABS: 650.0000 mg | ORAL_TABLET | ORAL | Status: DC

## 2015-12-29 MED ORDER — SODIUM CHLORIDE 0.9 % IV SOLN
500.0000 mg | INTRAVENOUS | Status: DC
  Administered 2015-12-29: 500 mg via INTRAVENOUS
  Filled 2015-12-29: qty 20

## 2015-12-29 MED ORDER — DIPHENHYDRAMINE HCL 25 MG PO CAPS: 25.0000 mg | ORAL_CAPSULE | ORAL | Status: DC

## 2015-12-29 NOTE — Telephone Encounter (Signed)
-----   Message from Bo Merino, MD sent at 12/29/2015 12:29 PM EST ----- Normal labs

## 2015-12-29 NOTE — Telephone Encounter (Signed)
Called patient to advise labs are normal

## 2015-12-29 NOTE — Progress Notes (Signed)
Normal labs.

## 2016-01-27 ENCOUNTER — Other Ambulatory Visit (HOSPITAL_COMMUNITY): Payer: Self-pay | Admitting: *Deleted

## 2016-01-28 ENCOUNTER — Encounter (HOSPITAL_COMMUNITY)
Admission: RE | Admit: 2016-01-28 | Discharge: 2016-01-28 | Disposition: A | Discharge status: Home / Self Care | Payer: BLUE CROSS/BLUE SHIELD | Source: Ambulatory Visit | Attending: Rheumatology | Admitting: Rheumatology

## 2016-01-28 DIAGNOSIS — M069 Rheumatoid arthritis, unspecified: Secondary | ICD-10-CM | POA: Insufficient documentation

## 2016-01-28 MED ORDER — DIPHENHYDRAMINE HCL 25 MG PO CAPS: 25.0000 mg | ORAL_CAPSULE | ORAL | Status: DC

## 2016-01-28 MED ORDER — ACETAMINOPHEN 325 MG PO TABS: 650.0000 mg | ORAL_TABLET | ORAL | Status: DC

## 2016-01-28 MED ORDER — SODIUM CHLORIDE 0.9 % IV SOLN
INTRAVENOUS | Status: DC
  Administered 2016-01-28: 10:00:00 via INTRAVENOUS

## 2016-01-28 MED ORDER — SODIUM CHLORIDE 0.9 % IV SOLN
500.0000 mg | INTRAVENOUS | Status: DC
  Administered 2016-01-28: 500 mg via INTRAVENOUS
  Filled 2016-01-28: qty 20

## 2016-02-17 ENCOUNTER — Telehealth: Payer: Self-pay | Admitting: Rheumatology

## 2016-02-17 NOTE — Telephone Encounter (Signed)
Patient called about lab results from last infusion in December.

## 2016-02-17 NOTE — Telephone Encounter (Signed)
Called patient  Faxed most recent labs to her per her request

## 2016-02-27 ENCOUNTER — Encounter: Payer: Self-pay | Admitting: Obstetrics & Gynecology

## 2016-02-27 ENCOUNTER — Ambulatory Visit (INDEPENDENT_AMBULATORY_CARE_PROVIDER_SITE_OTHER): Payer: BLUE CROSS/BLUE SHIELD | Admitting: Obstetrics & Gynecology

## 2016-02-27 VITALS — BP 98/56 | HR 60 | Resp 14 | Ht 61.5 in | Wt 127.6 lb

## 2016-02-27 DIAGNOSIS — Z0000 Encounter for general adult medical examination without abnormal findings: Secondary | ICD-10-CM | POA: Diagnosis not present

## 2016-02-27 DIAGNOSIS — Z205 Contact with and (suspected) exposure to viral hepatitis: Secondary | ICD-10-CM | POA: Diagnosis not present

## 2016-02-27 DIAGNOSIS — Z01419 Encounter for gynecological examination (general) (routine) without abnormal findings: Secondary | ICD-10-CM | POA: Diagnosis not present

## 2016-02-27 LAB — POCT URINALYSIS DIPSTICK
BILIRUBIN UA: NEGATIVE
Blood, UA: NEGATIVE
GLUCOSE UA: NEGATIVE
KETONES UA: NEGATIVE
LEUKOCYTES UA: NEGATIVE
NITRITE UA: NEGATIVE
PH UA: 8
Protein, UA: NEGATIVE
Urobilinogen, UA: NEGATIVE

## 2016-02-27 MED ORDER — ESTROGENS, CONJUGATED 0.625 MG/GM VA CREA: TOPICAL_CREAM | VAGINAL | 2 refills | Status: DC

## 2016-02-27 MED ORDER — VITAMIN D (ERGOCALCIFEROL) 1.25 MG (50000 UNIT) PO CAPS: 50000.0000 [IU] | ORAL_CAPSULE | ORAL | 4 refills | Status: DC

## 2016-02-27 NOTE — Progress Notes (Signed)
66 y.o. [id number] DivorcedCaucasianF here for annual exam.  Doing well.  Still working.  Still getting monthly infusions which really do help her.    Denies vaginal bleeding.    No LMP recorded. Patient has had a hysterectomy.          Sexually active: No.  The current method of family planning is status post hysterectomy.    Exercising: Yes.    walking and yoga Smoker:  Former smoker  Health Maintenance: Pap:  01/28/15 negative History of abnormal Pap:  yes MMG:  10/23/15 [place] BIRADS 2 benign Colonoscopy:  01/02/14- Dr. Collene Mares- Repeat 5 years  BMD:   12/25/14 osteopenia- repeat 2 years TDaP:  <5 years  Pneumonia vaccine(s):  Thinks she has had with PCP Zostavax:   never Hep C testing: discuss with provider Screening Labs: done recently at [facility], Hb today: same, Urine today: normal    reports that she quit smoking about 17 years ago. Her smoking use included Cigarettes. She has never used smokeless tobacco. She reports that she does not drink alcohol or use drugs.  Past Medical History:  Diagnosis Date  . Asthma    inhaler one x per day  . Cancer [facility])    breast, left  . Chiari malformation   . PONV (postoperative nausea and vomiting)   . Rheumatoid arthritis ([place])    on Humira    Past Surgical History:  Procedure Laterality Date  . BREAST SURGERY Left 2003   Lumpectomy T1C1, NO and negative ER/ PR  . COLONOSCOPY  10/04  . COLONOSCOPY  11/10   normal recheck in 5 years  . COLPORRHAPHY    . ESOPHAGOGASTRODUODENOSCOPY ENDOSCOPY  11/10   GERD  . FOOT SURGERY Left age 67   removal of pseudo rheumatoid nodules from left forefoot.  Marland Kitchen LAPAROSCOPIC BILATERAL SALPINGO OOPHERECTOMY N/A 07/30/2013   Procedure: LAPAROSCOPIC BILATERAL SALPINGO OOPHORECTOMY WITH WASHINGS;  Surgeon: Lyman Speller, MD;  Location: [facility];  Service: Gynecology;  Laterality: N/A;  . NASAL SEPTOPLASTY W/ TURBINOPLASTY Bilateral 05/24/2014   Procedure: NASAL SEPTOPLASTY WITH  BILATERAL TURBINATE REDUCTION;   Surgeon: Jerrell Belfast, MD;  Location: [place];  Service: ENT;  Laterality: Bilateral;  . PELVIC FLOOR REPAIR  3/04   SPARC with AP colporrhaphy  . TOTAL VAGINAL HYSTERECTOMY  1989   with AP repair    Current Outpatient Prescriptions  Medication Sig Dispense Refill  . Abatacept (ORENCIA IV) Inject 500 mg into the vein. Infuse 2 vials / 500 mg over 30 minutes every 4 weeks    . acetaminophen (TYLENOL) 325 MG tablet Take 650 mg by mouth once. One hour prior to infusion    . amphetamine-dextroamphetamine (ADDERALL) 10 MG tablet Take 10 mg by mouth daily as needed.  0  . conjugated estrogens (PREMARIN) vaginal cream Use 1/2 gram vaginally twice per week at bedtime 30 g 3  . diphenhydrAMINE (BENADRYL) 25 mg capsule Take 25 mg by mouth once. One hour prior to infusion    . famotidine (PEPCID) 10 MG tablet Take 10 mg by mouth daily.    . fluticasone (FLONASE) 50 MCG/ACT nasal spray Place 1 spray into both nostrils daily.    Marland Kitchen ibuprofen (ADVIL,MOTRIN) 200 MG tablet Take 400 mg by mouth every 6 (six) hours as needed (for inflammation).    Marland Kitchen lidocaine (LIDODERM) 5 % Place 1 patch onto the skin daily as needed (for shoulder pain).     . Magnesium 250 MG TABS Take 4 tablets by mouth daily.    Marland Kitchen  mometasone-formoterol (DULERA) 100-5 MCG/ACT AERO Inhale 1 puff into the lungs daily.     . Omega-3 Fatty Acids (FISH OIL) 1000 MG CAPS Take 1,000 mg by mouth daily.    . predniSONE (DELTASONE) 10 MG tablet as directed.     . promethazine (PHENERGAN) 25 MG tablet Take 25 mg by mouth every 6 (six) hours as needed for nausea or vomiting.    . RESTASIS 0.05 % ophthalmic emulsion Place 1 drop into both eyes 2 (two) times daily.    . Sodium Fluoride 1.1 % PSTE Apply small ribbon on tooth brush. Brush for 2 minutes. Expectorate and do not rinse for 72minutes.Do this at least 2x daily.    . temazepam (RESTORIL) 30 MG capsule Take 1 capsule by mouth at bedtime.    . VENTOLIN HFA 108 (90 BASE) MCG/ACT inhaler Inhale 2  puffs into the lungs every 4 (four) hours as needed.    . Vitamin D, Ergocalciferol, (DRISDOL) 50000 UNITS CAPS capsule Take 1 capsule (50,000 Units total) by mouth every 7 (seven) days. 12 capsule 4   No current facility-administered medications for this visit.     Family History  Problem Relation Age of Onset  . Diabetes Mother   . Heart failure Mother   . Bladder Cancer Mother   . Stroke Mother   . Cancer Father     head and neck  . Uterine cancer Maternal Aunt   . Bladder Cancer Maternal Grandmother   . Colon cancer Maternal Grandmother     ROS:  Pertinent items are noted in HPI.  Otherwise, a comprehensive ROS was negative.  Exam:   BP (!) 98/56 (BP Location: Right Arm, Patient Position: Sitting, Cuff Size: Normal)   Pulse 60   Resp 14   Ht 5' 1.5" (1.562 m)   Wt 127 lb 9.6 oz (57.9 kg)   BMI 23.72 kg/m   Weight change: @WEIGHTCHANGE @ Height:   Height: 5' 1.5" (156.2 cm)  Ht Readings from Last 3 Encounters:  02/27/16 5' 1.5" (1.562 m)  01/28/16 5\' 2"  (1.575 m)  12/29/15 5\' 2"  (1.575 m)   General appearance: alert, cooperative and appears stated age Head: Normocephalic, without obvious abnormality, atraumatic Neck: no adenopathy, supple, symmetrical, trachea midline and thyroid normal to inspection and palpation Lungs: clear to auscultation bilaterally Breasts: normal appearance, no masses or tenderness Heart: regular rate and rhythm Abdomen: soft, non-tender; bowel sounds normal; no masses,  no organomegaly Extremities: extremities normal, atraumatic, no cyanosis or edema Skin: Skin color, texture, turgor normal. No rashes or lesions Lymph nodes: Cervical, supraclavicular, and axillary nodes normal. No abnormal inguinal nodes palpated Neurologic: Grossly normal   Pelvic: External genitalia:  no lesions              Urethra:  normal appearing urethra with no masses, tenderness or lesions              Bartholins and Skenes: normal                 Vagina: normal  appearing vagina with normal color and discharge, no lesions              Cervix: absent              Pap taken: No. Bimanual Exam:  Uterus:  uterus absent              Adnexa: no mass, fullness, tenderness  Rectovaginal: Confirms               Anus:  normal sphincter tone, no lesions  Chaperone was present for exam.  A:  Well Woman with normal exam S/P left breast cancer 6/03 with ER/PR negative Urethral prolapse using vaginal estrogen externally prn RA S/P TVH with AP repair 1989, s/p laparoscopic BSO due to right serous cystadenoma 6/15 Osteoporosis Vit D deficiency  P:  Mammogram guidelines Pap not indicated Vit D 50K weekly.  #12/4RF sent to pharmacy Pt will return for fasting labs.  Order placed for TSH, Vit D, lipids, HbA1C, and Hep C antibody Refill on Estrace vaginal cream to use pea size to the urethra only prn. Pt will return for AEX two years.

## 2016-03-01 ENCOUNTER — Encounter (HOSPITAL_COMMUNITY)
Admission: RE | Admit: 2016-03-01 | Discharge: 2016-03-01 | Disposition: A | Discharge status: Home / Self Care | Payer: BLUE CROSS/BLUE SHIELD | Source: Ambulatory Visit | Attending: Rheumatology | Admitting: Rheumatology

## 2016-03-01 DIAGNOSIS — M069 Rheumatoid arthritis, unspecified: Secondary | ICD-10-CM | POA: Insufficient documentation

## 2016-03-01 LAB — CBC WITH DIFFERENTIAL/PLATELET
BASOS ABS: 0 10*3/uL (ref 0.0–0.1)
BASOS PCT: 0 %
EOS ABS: 0.2 10*3/uL (ref 0.0–0.7)
EOS PCT: 3 %
HCT: 37.9 % (ref 36.0–46.0)
Hemoglobin: 12.8 g/dL (ref 12.0–15.0)
Lymphocytes Relative: 27 %
Lymphs Abs: 2 10*3/uL (ref 0.7–4.0)
MCH: 28.3 pg (ref 26.0–34.0)
MCHC: 33.8 g/dL (ref 30.0–36.0)
MCV: 83.7 fL (ref 78.0–100.0)
MONO ABS: 0.4 10*3/uL (ref 0.1–1.0)
MONOS PCT: 6 %
Neutro Abs: 4.7 10*3/uL (ref 1.7–7.7)
Neutrophils Relative %: 64 %
PLATELETS: 350 10*3/uL (ref 150–400)
RBC: 4.53 MIL/uL (ref 3.87–5.11)
RDW: 13 % (ref 11.5–15.5)
WBC: 7.4 10*3/uL (ref 4.0–10.5)

## 2016-03-01 LAB — COMPREHENSIVE METABOLIC PANEL
ALBUMIN: 3.6 g/dL (ref 3.5–5.0)
ALT: 13 U/L — ABNORMAL LOW (ref 14–54)
ANION GAP: 10 (ref 5–15)
AST: 21 U/L (ref 15–41)
Alkaline Phosphatase: 62 U/L (ref 38–126)
BILIRUBIN TOTAL: 0.7 mg/dL (ref 0.3–1.2)
BUN: 10 mg/dL (ref 6–20)
CHLORIDE: 105 mmol/L (ref 101–111)
CO2: 22 mmol/L (ref 22–32)
Calcium: 9.3 mg/dL (ref 8.9–10.3)
Creatinine, Ser: 0.5 mg/dL (ref 0.44–1.00)
GFR calc Af Amer: >60 mL/min (ref >60)
GFR calc non Af Amer: >60 mL/min (ref >60)
GLUCOSE: 100 mg/dL — AB (ref 65–99)
POTASSIUM: 4.5 mmol/L (ref 3.5–5.1)
SODIUM: 137 mmol/L (ref 135–145)
TOTAL PROTEIN: 6.4 g/dL — AB (ref 6.5–8.1)

## 2016-03-01 MED ORDER — SODIUM CHLORIDE 0.9 % IV SOLN
500.0000 mg | INTRAVENOUS | Status: DC
  Administered 2016-03-01: 500 mg via INTRAVENOUS
  Filled 2016-03-01: qty 20

## 2016-03-01 MED ORDER — DIPHENHYDRAMINE HCL 25 MG PO CAPS: 25.0000 mg | ORAL_CAPSULE | ORAL | Status: DC

## 2016-03-01 MED ORDER — ACETAMINOPHEN 325 MG PO TABS: 650.0000 mg | ORAL_TABLET | ORAL | Status: DC

## 2016-03-01 MED ORDER — SODIUM CHLORIDE 0.9 % IV SOLN
INTRAVENOUS | Status: DC
  Administered 2016-03-01: 10:00:00 via INTRAVENOUS

## 2016-03-01 NOTE — Progress Notes (Signed)
CBC normal

## 2016-03-08 ENCOUNTER — Telehealth: Payer: Self-pay | Admitting: Radiology

## 2016-03-08 ENCOUNTER — Other Ambulatory Visit (INDEPENDENT_AMBULATORY_CARE_PROVIDER_SITE_OTHER): Payer: BLUE CROSS/BLUE SHIELD

## 2016-03-08 DIAGNOSIS — Z205 Contact with and (suspected) exposure to viral hepatitis: Secondary | ICD-10-CM

## 2016-03-08 DIAGNOSIS — Z0000 Encounter for general adult medical examination without abnormal findings: Secondary | ICD-10-CM

## 2016-03-08 LAB — LIPID PANEL
Cholesterol: 238 mg/dL — ABNORMAL HIGH (ref <200)
HDL: 72 mg/dL (ref >50)
LDL CALC: 145 mg/dL — AB (ref <100)
Total CHOL/HDL Ratio: 3.3 Ratio (ref <5.0)
Triglycerides: 104 mg/dL (ref <150)
VLDL: 21 mg/dL (ref <30)

## 2016-03-08 LAB — TSH: TSH: 1.2 mIU/L

## 2016-03-08 LAB — HEPATITIS C ANTIBODY: HCV Ab: NEGATIVE

## 2016-03-08 LAB — HEMOGLOBIN A1C
Hgb A1c MFr Bld: 5.2 % (ref <5.7)
MEAN PLASMA GLUCOSE: 103 mg/dL

## 2016-03-08 NOTE — Telephone Encounter (Signed)
Called patient to advise labs normal

## 2016-03-08 NOTE — Telephone Encounter (Signed)
-----   Message from Bo Merino, MD sent at 03/01/2016  9:40 AM EST ----- CBC normal

## 2016-03-09 LAB — VITAMIN D 25 HYDROXY (VIT D DEFICIENCY, FRACTURES): VIT D 25 HYDROXY: 33 ng/mL (ref 30–100)

## 2016-03-12 ENCOUNTER — Other Ambulatory Visit: Payer: Self-pay | Admitting: Radiology

## 2016-03-15 ENCOUNTER — Ambulatory Visit (INDEPENDENT_AMBULATORY_CARE_PROVIDER_SITE_OTHER): Payer: BLUE CROSS/BLUE SHIELD | Admitting: Neurology

## 2016-03-15 ENCOUNTER — Encounter: Payer: Self-pay | Admitting: Neurology

## 2016-03-15 VITALS — BP 120/70 | HR 83 | Ht 61.5 in | Wt 129.2 lb

## 2016-03-15 DIAGNOSIS — H8110 Benign paroxysmal vertigo, unspecified ear: Secondary | ICD-10-CM | POA: Diagnosis not present

## 2016-03-15 DIAGNOSIS — Z8589 Personal history of malignant neoplasm of other organs and systems: Secondary | ICD-10-CM | POA: Insufficient documentation

## 2016-03-15 DIAGNOSIS — R51 Headache: Secondary | ICD-10-CM | POA: Diagnosis not present

## 2016-03-15 DIAGNOSIS — M47812 Spondylosis without myelopathy or radiculopathy, cervical region: Secondary | ICD-10-CM | POA: Insufficient documentation

## 2016-03-15 DIAGNOSIS — Z8709 Personal history of other diseases of the respiratory system: Secondary | ICD-10-CM | POA: Insufficient documentation

## 2016-03-15 DIAGNOSIS — G935 Compression of brain: Secondary | ICD-10-CM

## 2016-03-15 DIAGNOSIS — Z853 Personal history of malignant neoplasm of breast: Secondary | ICD-10-CM | POA: Insufficient documentation

## 2016-03-15 DIAGNOSIS — Z85828 Personal history of other malignant neoplasm of skin: Secondary | ICD-10-CM | POA: Insufficient documentation

## 2016-03-15 DIAGNOSIS — G5603 Carpal tunnel syndrome, bilateral upper limbs: Secondary | ICD-10-CM

## 2016-03-15 DIAGNOSIS — Z79899 Other long term (current) drug therapy: Secondary | ICD-10-CM | POA: Insufficient documentation

## 2016-03-15 DIAGNOSIS — G4486 Cervicogenic headache: Secondary | ICD-10-CM

## 2016-03-15 DIAGNOSIS — Z8669 Personal history of other diseases of the nervous system and sense organs: Secondary | ICD-10-CM | POA: Insufficient documentation

## 2016-03-15 DIAGNOSIS — E559 Vitamin D deficiency, unspecified: Secondary | ICD-10-CM | POA: Insufficient documentation

## 2016-03-15 NOTE — Progress Notes (Signed)
NEUROLOGY CONSULTATION NOTE  Kristy Perez MRN: [id number] DOB: [birth year]  Referring provider: Dr. Laurann Montana Primary care provider: Dr. Laurann Montana  Reason for consult:  Chiari Malformation.  HISTORY OF PRESENT ILLNESS: Kristy Perez is a 66 year old right-handed female with RA and history of breast cancer status post chemotherapy who presents for Chiari I Malformation.  In 2006, she began to experience falls with headache, dizziness and nausea.  Watching movement triggered nausea and dizziness as well.  She had an MRI of the brain and cervical spine on 10/11/04, which was personally reviewed, and revealed mildly impacted low-lying cerebellar tonsils 4.1 to 5.4 mm below the foramen magnum with some dorsal displacement of the cervicomedullary junction due to a dorsal tilt of the dens and some transverse ligament hypertrophy with relative stenosis of the foramen magnum.  MRI of cervical spine revealed and no syrinx.  At the time, she deferred surgery and was followed by neurosurgery with periodic repeat imaging.  She continues to have chronic symptoms of headache and dizziness.  She still has occasional falls which she now attributes to arthritis of the hips, knees and ankles.    Over the past couple of years, she reports some mildly increased symptoms: 1.  Vertigo:  When she turns over in bed, she reports spinning sensation lasting 30 to 40 seconds.  There is some associated nausea.  It occurs twice a week.  Ativan helps the dizziness.  Promethazine helps the nausea. 2.  Headache:  She has posterior headache radiating up to the temples and down the neck bilaterally.  It is a pressure-like headache of moderate intensity.  It lasts 3 hours and occurs about once a week.  There is no associated nausea, photophobia, phonophobia or visual disturbance.  It is not the worse headache of her life, doesn't wake her up from sleep, and no associated unilateral weakness.  There is no specific trigger.  Sneezing  and coughing do not trigger or exacerbate it.  However, sneezing does produce a shooting pain radiating up the back of her head.  She takes Tylenol and Advil if needed.   3.  She is only able to lay prone in bed.  When she lays on her stomach, she will develop bilateral hand numbness which quickly resolves when she changes position.    She denies double vision.  Overall, she denies trouble swallowing unless her throat is dry.  However, on rare occasions, she reports trouble swallowing a pill.  PAST MEDICAL HISTORY: Past Medical History:  Diagnosis Date  . Asthma    inhaler one x per day  . Cancer [facility])    breast, left  . Chiari malformation   . PONV (postoperative nausea and vomiting)   . Rheumatoid arthritis ([place])    on Humira    PAST SURGICAL HISTORY: Past Surgical History:  Procedure Laterality Date  . BREAST SURGERY Left 2003   Lumpectomy T1C1, NO and negative ER/ PR  . COLONOSCOPY  10/04  . COLONOSCOPY  11/10   normal recheck in 5 years  . COLPORRHAPHY    . ESOPHAGOGASTRODUODENOSCOPY ENDOSCOPY  11/10   GERD  . FOOT SURGERY Left age 31   removal of pseudo rheumatoid nodules from left forefoot.  Marland Kitchen LAPAROSCOPIC BILATERAL SALPINGO OOPHERECTOMY N/A 07/30/2013   Procedure: LAPAROSCOPIC BILATERAL SALPINGO OOPHORECTOMY WITH WASHINGS;  Surgeon: Lyman Speller, MD;  Location: [facility];  Service: Gynecology;  Laterality: N/A;  . NASAL SEPTOPLASTY W/ TURBINOPLASTY Bilateral 05/24/2014   Procedure: NASAL SEPTOPLASTY WITH  BILATERAL TURBINATE REDUCTION;  Surgeon: Jerrell Belfast, MD;  Location: [place];  Service: ENT;  Laterality: Bilateral;  . PELVIC FLOOR REPAIR  3/04   SPARC with AP colporrhaphy  . TOTAL VAGINAL HYSTERECTOMY  1989   with AP repair    MEDICATIONS: Current Outpatient Prescriptions on File Prior to Visit  Medication Sig Dispense Refill  . Abatacept (ORENCIA IV) Inject 500 mg into the vein. Infuse 2 vials / 500 mg over 30 minutes every 4 weeks    . acetaminophen  (TYLENOL) 325 MG tablet Take 650 mg by mouth once. One hour prior to infusion    . amphetamine-dextroamphetamine (ADDERALL) 10 MG tablet Take 10 mg by mouth daily as needed.  0  . conjugated estrogens (PREMARIN) vaginal cream Use 1/2 gram vaginally twice per week at bedtime 30 g 2  . diphenhydrAMINE (BENADRYL) 25 mg capsule Take 25 mg by mouth once. One hour prior to infusion    . famotidine (PEPCID) 10 MG tablet Take 10 mg by mouth daily.    . fluticasone (FLONASE) 50 MCG/ACT nasal spray Place 1 spray into both nostrils daily.    Marland Kitchen ibuprofen (ADVIL,MOTRIN) 200 MG tablet Take 400 mg by mouth every 6 (six) hours as needed (for inflammation).    Marland Kitchen lidocaine (LIDODERM) 5 % Place 1 patch onto the skin daily as needed (for shoulder pain).     . Magnesium 250 MG TABS Take 4 tablets by mouth daily.    . mometasone-formoterol (DULERA) 100-5 MCG/ACT AERO Inhale 1 puff into the lungs daily.     . Omega-3 Fatty Acids (FISH OIL) 1000 MG CAPS Take 1,000 mg by mouth daily.    . predniSONE (DELTASONE) 10 MG tablet as directed.     . promethazine (PHENERGAN) 25 MG tablet Take 25 mg by mouth every 6 (six) hours as needed for nausea or vomiting.    . RESTASIS 0.05 % ophthalmic emulsion Place 1 drop into both eyes 2 (two) times daily.    . Sodium Fluoride 1.1 % PSTE Apply small ribbon on tooth brush. Brush for 2 minutes. Expectorate and do not rinse for 96minutes.Do this at least 2x daily.    . temazepam (RESTORIL) 30 MG capsule Take 1 capsule by mouth at bedtime.    . VENTOLIN HFA 108 (90 BASE) MCG/ACT inhaler Inhale 2 puffs into the lungs every 4 (four) hours as needed.    . Vitamin D, Ergocalciferol, (DRISDOL) 50000 units CAPS capsule Take 1 capsule (50,000 Units total) by mouth every 7 (seven) days. 12 capsule 4   No current facility-administered medications on file prior to visit.     ALLERGIES: Allergies  Allergen Reactions  . Fish Allergy Anaphylaxis  . Iodine Anaphylaxis    " PER PT IV CONTRAST"  .  Erythromycin   . Sulfonamide Derivatives Other (See Comments)    Tongue turns black.    FAMILY HISTORY: Family History  Problem Relation Age of Onset  . Diabetes Mother   . Heart failure Mother   . Bladder Cancer Mother   . Stroke Mother   . Cancer Father     head and neck  . Uterine cancer Maternal Aunt   . Bladder Cancer Maternal Grandmother   . Colon cancer Maternal Grandmother     SOCIAL HISTORY: Social History   Social History  . Marital status: Divorced    Spouse name: N/A  . Number of children: 2  . Years of education: N/A   Occupational History  . Not on  file.   Social History Main Topics  . Smoking status: Former Smoker    Types: Cigarettes    Quit date: 02/16/1999  . Smokeless tobacco: Never Used  . Alcohol use No  . Drug use: No  . Sexual activity: No   Other Topics Concern  . Not on file   Social History Narrative   Lives alone in a 2 story home. Has 2 children.  Works as a Radiation protection practitioner.  Education: college.     REVIEW OF SYSTEMS: Constitutional: No fevers, chills, or sweats, no generalized fatigue, change in appetite Eyes: No visual changes, double vision, eye pain Ear, nose and throat:occasionally has trouble swallowing Cardiovascular: No chest pain, palpitations Respiratory:  No shortness of breath at rest or with exertion, wheezes GastrointestinaI: No nausea, vomiting, diarrhea, abdominal pain, fecal incontinence Genitourinary:  No dysuria, urinary retention or frequency Musculoskeletal:  Neck pain Integumentary: No rash, pruritus, skin lesions Neurological: as above Psychiatric: No depression, insomnia, anxiety Endocrine: No palpitations, fatigue, diaphoresis, mood swings, change in appetite, change in weight, increased thirst Hematologic/Lymphatic:  No purpura, petechiae. Allergic/Immunologic: no itchy/runny eyes, nasal congestion, recent allergic reactions, rashes  PHYSICAL EXAM: Vitals:   03/15/16 1245  BP: 120/70  Pulse: 83    General: No acute distress.  Patient appears well-groomed.  Head:  Normocephalic/atraumatic Eyes:  fundi examined but not visualized Neck: supple, bilateral paraspinal tenderness, full range of motion Back: No paraspinal tenderness Heart: regular rate and rhythm Lungs: Clear to auscultation bilaterally. Vascular: No carotid bruits. Neurological Exam: Mental status: alert and oriented to person, place, and time, recent and remote memory intact, fund of knowledge intact, attention and concentration intact, speech fluent and not dysarthric, language intact. Cranial nerves: CN I: not tested CN II: pupils equal, round and reactive to light, visual fields intact CN III, IV, VI:  full range of motion, no nystagmus, no ptosis CN V: facial sensation intact CN VII: upper and lower face symmetric CN VIII: hearing intact CN IX, X: gag intact, uvula midline CN XI: sternocleidomastoid and trapezius muscles intact CN XII: tongue midline Bulk & Tone: normal, no fasciculations. Motor:  5/5 throughout  Sensation: temperature and vibration sensation intact. Deep Tendon Reflexes:  2+ throughout, toes downgoing.  Finger to nose testing:  Without dysmetria.  Heel to shin:  Without dysmetria.  Gait:  Slightly antalgic.  Able to turn and tandem walk. Romberg negative.  IMPRESSION: Chiari I Malformation. At this point, I feel she is asymptomatic.  I believe her chronic symptoms are associated with other etiologies: Headache:  Cervicogenic headache related to arthritis in neck Vertigo:  Benign paroxysmal positional vertigo Bilateral hand numbness:  Carpal tunnel syndrome  Since her cerebellar tonsils are impacted, I feel we should re-image to look for any worsening, including signs of hydrocephalus (which so far she doesn't exhibit any clinical symptoms)  PLAN: 1.  MRI of brain 2.  Wrist splints at bedtime 3.  If vertigo becomes more frequent, consider vestibular rehabilitation 4.  She does not  wish to treat headaches as they are infrequent and manageable. 5.  Will contact patient with MRI results.  Otherwise, may follow up in one year or as needed.  Thank you for allowing me to take part in the care of this patient.  Metta Clines, DO  CC:  Kelton Pillar, MD

## 2016-03-15 NOTE — Progress Notes (Signed)
Office Visit Note  [name]. Kristy Perez             Date of Birth: 1950/05/23           MRN: [id number]             PCP: Osborne Casco, MD Referring: Stephens Shire, MD Visit Date: 03/17/2016 Occupation: @GUAROCC @    Subjective:  Right hip pain   History of Present Illness: Kristy Perez is a 66 y.o. female with history of sero positive rheumatoid arthritis. She states her arthritis is quite well-controlled on Orencia IV. She denies any joint swelling. She's been having pain and discomfort in her right trochanteric area. Her neck continues to be causing some discomfort.  Activities of Daily Living:  Patient reports morning stiffness for 0 minutes.   Patient Reports nocturnal pain.  Difficulty dressing/grooming: Denies Difficulty climbing stairs: Denies Difficulty getting out of chair: Denies Difficulty using hands for taps, buttons, cutlery, and/or writing: Denies   Review of Systems  Constitutional: Negative for fatigue, night sweats, weight gain, weight loss and weakness.  HENT: Negative for mouth sores, trouble swallowing, trouble swallowing, mouth dryness and nose dryness.   Eyes: Negative for pain, redness, visual disturbance and dryness.  Respiratory: Negative for cough, shortness of breath and difficulty breathing.   Cardiovascular: Negative for chest pain, palpitations, hypertension, irregular heartbeat and swelling in legs/feet.  Gastrointestinal: Negative for blood in stool, constipation and diarrhea.  Endocrine: Negative for increased urination.  Genitourinary: Negative for vaginal dryness.  Musculoskeletal: Positive for arthralgias, joint pain, myalgias and myalgias. Negative for joint swelling, muscle weakness, morning stiffness and muscle tenderness.  Skin: Negative for color change, rash, hair loss, skin tightness, ulcers and sensitivity to sunlight.  Allergic/Immunologic: Negative for susceptible to infections.  Neurological: Negative for  dizziness, memory loss and night sweats.  Hematological: Negative for swollen glands.  Psychiatric/Behavioral: Positive for sleep disturbance. Negative for depressed mood. The patient is not nervous/anxious.     PMFS History:  Patient Active Problem List   Diagnosis Date Noted  . High risk medication use 03/15/2016  . DJD (degenerative joint disease), cervical 03/15/2016  . History of asthma 03/15/2016  . History of squamous cell carcinoma 03/15/2016  . History of basal cell carcinoma 03/15/2016  . History of breast cancer 03/15/2016  . Gastroesophageal reflux disease without esophagitis 03/15/2016  . History of Chiari malformation 03/15/2016  . Vitamin D deficiency 03/15/2016  . Rheumatoid arthritis with rheumatoid factor of multiple sites without organ or systems involvement ([place]) 10/20/2015  . Deviated nasal septum 05/24/2014    Class: Chronic  . Insomnia 11/25/2007  . DYSPNEA 11/15/2007    Past Medical History:  Diagnosis Date  . Asthma    inhaler one x per day  . Cancer [facility])    breast, left  . Chiari malformation   . PONV (postoperative nausea and vomiting)   . Rheumatoid arthritis ([facility])    on Humira    Family History  Problem Relation Age of Onset  . Diabetes Mother   . Heart failure Mother   . Bladder Cancer Mother   . Stroke Mother   . Cancer Father     head and neck  . Uterine cancer Maternal Aunt   . Bladder Cancer Maternal Grandmother   . Colon cancer Maternal Grandmother    Past Surgical History:  Procedure Laterality Date  . BREAST SURGERY Left 2003   Lumpectomy T1C1, NO and negative ER/ PR  . COLONOSCOPY  10/04  .  COLONOSCOPY  11/10   normal recheck in 5 years  . COLPORRHAPHY    . ESOPHAGOGASTRODUODENOSCOPY ENDOSCOPY  11/10   GERD  . FOOT SURGERY Left age 22   removal of pseudo rheumatoid nodules from left forefoot.  Marland Kitchen LAPAROSCOPIC BILATERAL SALPINGO OOPHERECTOMY N/A 07/30/2013   Procedure: LAPAROSCOPIC BILATERAL SALPINGO OOPHORECTOMY WITH  WASHINGS;  Surgeon: Lyman Speller, MD;  Location: [facility];  Service: Gynecology;  Laterality: N/A;  . NASAL SEPTOPLASTY W/ TURBINOPLASTY Bilateral 05/24/2014   Procedure: NASAL SEPTOPLASTY WITH  BILATERAL TURBINATE REDUCTION;  Surgeon: Jerrell Belfast, MD;  Location: [place];  Service: ENT;  Laterality: Bilateral;  . PELVIC FLOOR REPAIR  3/04   SPARC with AP colporrhaphy  . TOTAL VAGINAL HYSTERECTOMY  1989   with AP repair   Social History   Social History Narrative   Lives alone in a 2 story home. Has 2 children.  Works as a Radiation protection practitioner.  Education: college.      Objective: Vital Signs: BP 110/70   Pulse 68   Resp 14   Ht 5' 1.5" (1.562 m)   Wt 126 lb (57.2 kg)   BMI 23.42 kg/m    Physical Exam  Constitutional: She is oriented to person, place, and time. She appears well-developed and well-nourished.  HENT:  Head: Normocephalic and atraumatic.  Eyes: Conjunctivae and EOM are normal.  Neck: Normal range of motion.  Cardiovascular: Normal rate, regular rhythm, normal heart sounds and intact distal pulses.   Pulmonary/Chest: Effort normal and breath sounds normal.  Abdominal: Soft. Bowel sounds are normal.  Lymphadenopathy:    She has no cervical adenopathy.  Neurological: She is alert and oriented to person, place, and time.  Skin: Skin is warm and dry. Capillary refill takes less than 2 seconds.  Psychiatric: She has a normal mood and affect. Her behavior is normal.  Nursing note and vitals reviewed.    Musculoskeletal Exam: C-spine some limitation with range of motion and thoracic lumbar spine good range of motion. Shoulder joints although joints wrist joints are good range of motion she has thickening of bilateral CMC joints PIP/DIP joints she has some synovial thickening over bilateral second MCP joint. No synovitis was noted. Hip joints knee joints ankles MTPs PIPs with good range of motion. She is tenderness over right trochanteric bursa area consistent with trochanteric  bursitis  CDAI Exam: CDAI Homunculus Exam:   Joint Counts:  CDAI Tender Joint count: 0 CDAI Swollen Joint count: 0  Global Assessments:  Patient Global Assessment: 4 Provider Global Assessment: 4  CDAI Calculated Score: 8    Investigation: Findings:  06/04/2015 Followup x-rays of her bilateral hands which showed all MCP subluxation with ulnar deviation, bilateral 2nd MCP narrowing, right 3rd MCP narrowing and PIP and DIP narrowing and CMC narrowing consistent with RA and OA overlap.  Bilateral foot x-ray showed bilateral 4th and 5th MTP narrowing and erosive changes, bilateral 1st MTP narrowing, there was no interval change.  TB Gold was negative on June 11, 2015.  HIV negative 06/09/2013  11/04/2015 RAPID-3 shows a raw score of 7.5 with an index of 2.5, consistent with moderate severity.  On Orencia IV every 4 weeks.  03/01/2016 CBC normal, CMP normal    Imaging: No results found.  Speciality Comments: No specialty comments available.    Procedures:  Large Joint Inj Date/Time: 03/17/2016 11:38 AM Performed by: Bo Merino Authorized by: Bo Merino   Consent Given by:  Patient Site marked: the procedure site was marked  Timeout: prior to procedure the correct patient, procedure, and site was verified   Indications:  Pain Location:  Hip Site:  R greater trochanter Prep: patient was prepped and draped in usual sterile fashion   Needle Size:  27 G Needle Length:  1.5 inches Approach:  Lateral Ultrasound Guidance: No   Fluoroscopic Guidance: No   Arthrogram: No   Medications:  40 mg triamcinolone acetonide 40 MG/ML; 1.5 mL lidocaine 1 % Aspiration Attempted: No   Aspirate amount (mL):  0 Patient tolerance:  Patient tolerated the procedure well with no immediate complications   Allergies: Fish allergy; Iodine; Erythromycin; and Sulfonamide derivatives   Assessment / Plan:     Visit Diagnoses: Rheumatoid arthritis with rheumatoid factor of  multiple sites without organ or systems involvement ([place]) - Positive RF, positive CCP, erosive disease. She's been doing really well on on Orencia IV. She has some synovial thickening but no synovitis.  High risk medication use - Orencia IV every 4 weeks(inadequate response to Enbrel, Humira) her labs are stable. They will be drawn with her infusions.  Primary insomnia: Sleep hygiene was discussed.  DJD (degenerative joint disease), cervical she has some chronic discomfort range of motion exercises were discussed.  Trochanteric bursitis of right hip: She has severe pain and discomfort after informed consent was obtained the area was prepped and injected is described above she tolerated the procedure well  Her other medical problems are listed as follows:  History of asthma  History of squamous cell carcinoma  History of basal cell carcinoma  History of breast cancer - 2003  Gastroesophageal reflux disease without esophagitis  History of Chiari malformation  Vitamin D deficiency    Orders: Orders Placed This Encounter  Procedures  . Large Joint Injection/Arthrocentesis   No orders of the defined types were placed in this encounter.   Face-to-face time spent with patient was 30 minutes. 50% of time was spent in counseling and coordination of care.  Follow-Up Instructions: Return in about 5 months (around 08/14/2016) for Rheumatoid arthritis.   Bo Merino, MD  Note - This record has been created using Editor, commissioning.  Chart creation errors have been sought, but may not always  have been located. Such creation errors do not reflect on  the standard of medical care.

## 2016-03-15 NOTE — Patient Instructions (Addendum)
I think the headaches are likely from the neck, numbness in hands from carpal tunnel syndrome and dizziness from "crystals in the ears".  However, I think it is necessary to repeat MRI of the brain to evaluate for any worsening.  We will contact you with results and whether any other testing or treatment is warranted.  Otherwise, you may follow up in one year.

## 2016-03-15 NOTE — Progress Notes (Signed)
Note routed

## 2016-03-17 ENCOUNTER — Ambulatory Visit (INDEPENDENT_AMBULATORY_CARE_PROVIDER_SITE_OTHER): Payer: BLUE CROSS/BLUE SHIELD | Admitting: Rheumatology

## 2016-03-17 ENCOUNTER — Encounter: Payer: Self-pay | Admitting: Rheumatology

## 2016-03-17 VITALS — BP 110/70 | HR 68 | Resp 14 | Ht 61.5 in | Wt 126.0 lb

## 2016-03-17 DIAGNOSIS — M47812 Spondylosis without myelopathy or radiculopathy, cervical region: Secondary | ICD-10-CM

## 2016-03-17 DIAGNOSIS — M5030 Other cervical disc degeneration, unspecified cervical region: Secondary | ICD-10-CM

## 2016-03-17 DIAGNOSIS — Z8589 Personal history of malignant neoplasm of other organs and systems: Secondary | ICD-10-CM

## 2016-03-17 DIAGNOSIS — K219 Gastro-esophageal reflux disease without esophagitis: Secondary | ICD-10-CM

## 2016-03-17 DIAGNOSIS — Z8669 Personal history of other diseases of the nervous system and sense organs: Secondary | ICD-10-CM

## 2016-03-17 DIAGNOSIS — E559 Vitamin D deficiency, unspecified: Secondary | ICD-10-CM | POA: Diagnosis not present

## 2016-03-17 DIAGNOSIS — F5101 Primary insomnia: Secondary | ICD-10-CM | POA: Diagnosis not present

## 2016-03-17 DIAGNOSIS — Z79899 Other long term (current) drug therapy: Secondary | ICD-10-CM | POA: Diagnosis not present

## 2016-03-17 DIAGNOSIS — M7061 Trochanteric bursitis, right hip: Secondary | ICD-10-CM

## 2016-03-17 DIAGNOSIS — Z85828 Personal history of other malignant neoplasm of skin: Secondary | ICD-10-CM

## 2016-03-17 DIAGNOSIS — Z853 Personal history of malignant neoplasm of breast: Secondary | ICD-10-CM

## 2016-03-17 DIAGNOSIS — Z8709 Personal history of other diseases of the respiratory system: Secondary | ICD-10-CM | POA: Diagnosis not present

## 2016-03-17 DIAGNOSIS — M0579 Rheumatoid arthritis with rheumatoid factor of multiple sites without organ or systems involvement: Secondary | ICD-10-CM

## 2016-03-17 MED ORDER — LIDOCAINE HCL 1 % IJ SOLN
1.5000 mL | INTRAMUSCULAR | Status: AC | PRN
  Administered 2016-03-17: 1.5 mL

## 2016-03-17 MED ORDER — TRIAMCINOLONE ACETONIDE 40 MG/ML IJ SUSP
40.0000 mg | INTRAMUSCULAR | Status: AC | PRN
  Administered 2016-03-17: 40 mg via INTRA_ARTICULAR

## 2016-03-17 NOTE — Progress Notes (Signed)
Rheumatology Medication Review by a Pharmacist Does the patient feel that his/her medications are working for him/her?  Yes Has the patient been experiencing any side effects to the medications prescribed?  No  Issues to address at subsequent visits: None   Pharmacist comments:  Kristy Perez is a pleasant 66 yo F who presents for follow up of rheumatoid arthritis.  She currently gets Orencia infusions (500 mg every 4 weeks) from [facility].  Patient reports she has a high deductible insurance plan and has to pay a lot out of pocket for her infusions.  Advised she may be eligible for the Orencia IV reimbursement program.  I gave her information on the [place] patient support center and advised patient to call them to try to enroll in the program.  Reviewed with her that to get reimbursed she will have to submit the explanation of benefits after her infusions to the program and they may reimburse the cost of the medication.  They will not pay for the administration fee, lab charges, etc.    Also discussed medicare with patient as she will be turning 66 in August.  Reviewed with patient that medicare covers 80% of the cost of infusions.  Supplement plans generally pick up the remaining 20%.  Provided patient with information on her local [place] Program office and advised her to contact them if she has any questions regarding medicare plans.  Patient voiced understanding.  She denied any other questions or concerns regarding her medications.    [name], Pharm.D., BCPS, CPP Clinical Pharmacist Pager: [phone] Phone: [phone] 03/17/2016 11:15 AM

## 2016-03-18 ENCOUNTER — Ambulatory Visit: Payer: BLUE CROSS/BLUE SHIELD | Admitting: Rheumatology

## 2016-03-23 ENCOUNTER — Ambulatory Visit
Admission: RE | Admit: 2016-03-23 | Discharge: 2016-03-23 | Disposition: A | Discharge status: Home / Self Care | Payer: BLUE CROSS/BLUE SHIELD | Source: Ambulatory Visit | Attending: Neurology | Admitting: Neurology

## 2016-03-23 ENCOUNTER — Telehealth: Payer: Self-pay

## 2016-03-23 DIAGNOSIS — G5603 Carpal tunnel syndrome, bilateral upper limbs: Secondary | ICD-10-CM

## 2016-03-23 DIAGNOSIS — G4486 Cervicogenic headache: Secondary | ICD-10-CM

## 2016-03-23 DIAGNOSIS — H8110 Benign paroxysmal vertigo, unspecified ear: Secondary | ICD-10-CM

## 2016-03-23 DIAGNOSIS — G935 Compression of brain: Secondary | ICD-10-CM

## 2016-03-23 DIAGNOSIS — R51 Headache: Secondary | ICD-10-CM

## 2016-03-23 NOTE — Telephone Encounter (Signed)
-----   Message from Pieter Partridge, DO sent at 03/23/2016  2:31 PM EST ----- The Chiari Malformation has not progressed since MRI from 2009.  It actually looks less prominent.  There is no evidence of increased pressure in the head.

## 2016-03-23 NOTE — Telephone Encounter (Signed)
Called patient. Gave MRI results. Patient verbalized understanding.  

## 2016-03-31 ENCOUNTER — Other Ambulatory Visit (HOSPITAL_COMMUNITY): Payer: Self-pay | Admitting: *Deleted

## 2016-04-01 ENCOUNTER — Ambulatory Visit (HOSPITAL_COMMUNITY)
Admission: RE | Admit: 2016-04-01 | Discharge: 2016-04-01 | Disposition: A | Discharge status: Home / Self Care | Payer: BLUE CROSS/BLUE SHIELD | Source: Ambulatory Visit | Attending: Rheumatology | Admitting: Rheumatology

## 2016-04-01 DIAGNOSIS — M0689 Other specified rheumatoid arthritis, multiple sites: Secondary | ICD-10-CM | POA: Diagnosis present

## 2016-04-01 MED ORDER — ACETAMINOPHEN 325 MG PO TABS: 650.0000 mg | ORAL_TABLET | ORAL | Status: DC

## 2016-04-01 MED ORDER — SODIUM CHLORIDE 0.9 % IV SOLN
500.0000 mg | INTRAVENOUS | Status: DC
  Administered 2016-04-01: 500 mg via INTRAVENOUS
  Filled 2016-04-01: qty 20

## 2016-04-01 MED ORDER — DIPHENHYDRAMINE HCL 25 MG PO CAPS: 25.0000 mg | ORAL_CAPSULE | ORAL | Status: DC

## 2016-04-01 MED ORDER — SODIUM CHLORIDE 0.9 % IV SOLN
INTRAVENOUS | Status: DC
  Administered 2016-04-01: 10:00:00 via INTRAVENOUS

## 2016-04-27 ENCOUNTER — Encounter (HOSPITAL_COMMUNITY): Payer: BLUE CROSS/BLUE SHIELD

## 2016-04-28 ENCOUNTER — Ambulatory Visit (HOSPITAL_COMMUNITY)
Admission: RE | Admit: 2016-04-28 | Discharge: 2016-04-28 | Disposition: A | Discharge status: Home / Self Care | Payer: BLUE CROSS/BLUE SHIELD | Source: Ambulatory Visit | Attending: Rheumatology | Admitting: Rheumatology

## 2016-04-28 DIAGNOSIS — M0579 Rheumatoid arthritis with rheumatoid factor of multiple sites without organ or systems involvement: Secondary | ICD-10-CM | POA: Insufficient documentation

## 2016-04-28 LAB — CBC WITH DIFFERENTIAL/PLATELET
BASOS ABS: 0 10*3/uL (ref 0.0–0.1)
BASOS PCT: 0 %
EOS ABS: 0.2 10*3/uL (ref 0.0–0.7)
Eosinophils Relative: 3 %
HCT: 36.4 % (ref 36.0–46.0)
Hemoglobin: 12.2 g/dL (ref 12.0–15.0)
Lymphocytes Relative: 26 %
Lymphs Abs: 2 10*3/uL (ref 0.7–4.0)
MCH: 27.9 pg (ref 26.0–34.0)
MCHC: 33.5 g/dL (ref 30.0–36.0)
MCV: 83.3 fL (ref 78.0–100.0)
MONO ABS: 0.8 10*3/uL (ref 0.1–1.0)
MONOS PCT: 10 %
Neutro Abs: 4.8 10*3/uL (ref 1.7–7.7)
Neutrophils Relative %: 61 %
PLATELETS: 387 10*3/uL (ref 150–400)
RBC: 4.37 MIL/uL (ref 3.87–5.11)
RDW: 13.1 % (ref 11.5–15.5)
WBC: 7.7 10*3/uL (ref 4.0–10.5)

## 2016-04-28 LAB — COMPREHENSIVE METABOLIC PANEL
ALBUMIN: 3.8 g/dL (ref 3.5–5.0)
ALT: 18 U/L (ref 14–54)
ANION GAP: 9 (ref 5–15)
AST: 23 U/L (ref 15–41)
Alkaline Phosphatase: 59 U/L (ref 38–126)
BUN: 8 mg/dL (ref 6–20)
CHLORIDE: 105 mmol/L (ref 101–111)
CO2: 22 mmol/L (ref 22–32)
Calcium: 8.9 mg/dL (ref 8.9–10.3)
Creatinine, Ser: 0.56 mg/dL (ref 0.44–1.00)
GFR calc Af Amer: >60 mL/min (ref >60)
GFR calc non Af Amer: >60 mL/min (ref >60)
GLUCOSE: 121 mg/dL — AB (ref 65–99)
Potassium: 3.8 mmol/L (ref 3.5–5.1)
SODIUM: 136 mmol/L (ref 135–145)
TOTAL PROTEIN: 6.1 g/dL — AB (ref 6.5–8.1)
Total Bilirubin: 0.6 mg/dL (ref 0.3–1.2)

## 2016-04-28 MED ORDER — ACETAMINOPHEN 325 MG PO TABS
650.0000 mg | ORAL_TABLET | ORAL | Status: DC
Start: 2016-04-29 — End: 2016-04-29

## 2016-04-28 MED ORDER — DIPHENHYDRAMINE HCL 25 MG PO CAPS: 25.0000 mg | ORAL_CAPSULE | ORAL | Status: DC

## 2016-04-28 MED ORDER — SODIUM CHLORIDE 0.9 % IV SOLN
INTRAVENOUS | Status: DC
  Administered 2016-04-28: 10:00:00 via INTRAVENOUS

## 2016-04-28 MED ORDER — ABATACEPT 250 MG IV SOLR
500.0000 mg | INTRAVENOUS | Status: DC
  Filled 2016-04-28: qty 20

## 2016-04-28 MED ORDER — SODIUM CHLORIDE 0.9 % IV SOLN
500.0000 mg | INTRAVENOUS | Status: DC
  Administered 2016-04-28: 500 mg via INTRAVENOUS
  Filled 2016-04-28: qty 20

## 2016-04-28 NOTE — Progress Notes (Signed)
Labs normal, mild elevation of glucose

## 2016-04-30 ENCOUNTER — Other Ambulatory Visit: Payer: Self-pay | Admitting: Radiology

## 2016-04-30 DIAGNOSIS — M0579 Rheumatoid arthritis with rheumatoid factor of multiple sites without organ or systems involvement: Secondary | ICD-10-CM

## 2016-05-13 ENCOUNTER — Other Ambulatory Visit: Payer: Self-pay | Admitting: Radiology

## 2016-05-24 ENCOUNTER — Ambulatory Visit (HOSPITAL_COMMUNITY)
Admission: RE | Admit: 2016-05-24 | Discharge: 2016-05-24 | Disposition: A | Discharge status: Home / Self Care | Payer: BLUE CROSS/BLUE SHIELD | Source: Ambulatory Visit | Attending: Rheumatology | Admitting: Rheumatology

## 2016-05-24 DIAGNOSIS — M0579 Rheumatoid arthritis with rheumatoid factor of multiple sites without organ or systems involvement: Secondary | ICD-10-CM | POA: Insufficient documentation

## 2016-05-24 MED ORDER — SODIUM CHLORIDE 0.9 % IV SOLN
500.0000 mg | INTRAVENOUS | Status: AC
  Administered 2016-05-24: 500 mg via INTRAVENOUS
  Filled 2016-05-24: qty 20

## 2016-05-24 MED ORDER — SODIUM CHLORIDE 0.9 % IV SOLN
INTRAVENOUS | Status: DC
  Administered 2016-05-24: 10:00:00 via INTRAVENOUS

## 2016-05-24 MED ORDER — SODIUM CHLORIDE 0.9 % IV SOLN
500.0000 mg | INTRAVENOUS | Status: DC
  Filled 2016-05-24: qty 20

## 2016-05-24 NOTE — Progress Notes (Signed)
Pt reports taking Tylenol and Benadryl at home prior to arrival.

## 2016-06-04 ENCOUNTER — Other Ambulatory Visit: Payer: Self-pay | Admitting: Radiology

## 2016-06-04 DIAGNOSIS — M0579 Rheumatoid arthritis with rheumatoid factor of multiple sites without organ or systems involvement: Secondary | ICD-10-CM

## 2016-06-04 NOTE — Progress Notes (Signed)
Patients CBC CMP Tylenol and Benadryl were in system but not the Orencia. I have put in the orencia again and have also put in standing orders for the TB gold

## 2016-06-14 ENCOUNTER — Other Ambulatory Visit (HOSPITAL_COMMUNITY): Payer: Self-pay | Admitting: *Deleted

## 2016-06-15 ENCOUNTER — Encounter (HOSPITAL_COMMUNITY)
Admission: RE | Admit: 2016-06-15 | Discharge: 2016-06-15 | Disposition: A | Discharge status: Home / Self Care | Payer: BLUE CROSS/BLUE SHIELD | Source: Ambulatory Visit | Attending: Rheumatology | Admitting: Rheumatology

## 2016-06-15 ENCOUNTER — Telehealth: Payer: Self-pay | Admitting: Radiology

## 2016-06-15 DIAGNOSIS — M0579 Rheumatoid arthritis with rheumatoid factor of multiple sites without organ or systems involvement: Secondary | ICD-10-CM | POA: Diagnosis present

## 2016-06-15 LAB — COMPREHENSIVE METABOLIC PANEL
ALT: 19 U/L (ref 14–54)
ANION GAP: 8 (ref 5–15)
AST: 24 U/L (ref 15–41)
Albumin: 3.7 g/dL (ref 3.5–5.0)
Alkaline Phosphatase: 64 U/L (ref 38–126)
BILIRUBIN TOTAL: 0.7 mg/dL (ref 0.3–1.2)
BUN: 10 mg/dL (ref 6–20)
CALCIUM: 9.1 mg/dL (ref 8.9–10.3)
CO2: 23 mmol/L (ref 22–32)
Chloride: 104 mmol/L (ref 101–111)
Creatinine, Ser: 0.52 mg/dL (ref 0.44–1.00)
GFR calc Af Amer: >60 mL/min (ref >60)
Glucose, Bld: 91 mg/dL (ref 65–99)
POTASSIUM: 4 mmol/L (ref 3.5–5.1)
Sodium: 135 mmol/L (ref 135–145)
TOTAL PROTEIN: 6.3 g/dL — AB (ref 6.5–8.1)

## 2016-06-15 LAB — CBC WITH DIFFERENTIAL/PLATELET
Basophils Absolute: 0 10*3/uL (ref 0.0–0.1)
Basophils Relative: 0 %
Eosinophils Absolute: 0.4 10*3/uL (ref 0.0–0.7)
Eosinophils Relative: 5 %
HEMATOCRIT: 35.8 % — AB (ref 36.0–46.0)
Hemoglobin: 11.9 g/dL — ABNORMAL LOW (ref 12.0–15.0)
Lymphocytes Relative: 31 %
Lymphs Abs: 2.4 10*3/uL (ref 0.7–4.0)
MCH: 27.9 pg (ref 26.0–34.0)
MCHC: 33.2 g/dL (ref 30.0–36.0)
MCV: 83.8 fL (ref 78.0–100.0)
MONO ABS: 0.8 10*3/uL (ref 0.1–1.0)
MONOS PCT: 10 %
NEUTROS PCT: 54 %
Neutro Abs: 4.3 10*3/uL (ref 1.7–7.7)
Platelets: 347 10*3/uL (ref 150–400)
RBC: 4.27 MIL/uL (ref 3.87–5.11)
RDW: 13.4 % (ref 11.5–15.5)
WBC: 7.9 10*3/uL (ref 4.0–10.5)

## 2016-06-15 MED ORDER — SODIUM CHLORIDE 0.9 % IV SOLN
500.0000 mg | Freq: Once | INTRAVENOUS | Status: AC
  Administered 2016-06-15: 500 mg via INTRAVENOUS
  Filled 2016-06-15: qty 20

## 2016-06-15 MED ORDER — ACETAMINOPHEN 325 MG PO TABS: 650.0000 mg | ORAL_TABLET | ORAL | Status: DC

## 2016-06-15 MED ORDER — DIPHENHYDRAMINE HCL 25 MG PO CAPS: 25.0000 mg | ORAL_CAPSULE | ORAL | Status: DC

## 2016-06-15 MED ORDER — SODIUM CHLORIDE 0.9 % IV SOLN
INTRAVENOUS | Status: DC
Start: 2016-06-15 — End: 2016-06-16
  Administered 2016-06-15: 10:00:00 via INTRAVENOUS

## 2016-06-15 NOTE — Telephone Encounter (Signed)
I have called patient to advise labs are normal  

## 2016-06-15 NOTE — Telephone Encounter (Signed)
-----   Message from Candice Camp, RT sent at 06/15/2016  1:00 PM EDT ----- Normal

## 2016-06-16 ENCOUNTER — Telehealth: Payer: Self-pay | Admitting: Pharmacist

## 2016-06-16 DIAGNOSIS — M0579 Rheumatoid arthritis with rheumatoid factor of multiple sites without organ or systems involvement: Secondary | ICD-10-CM

## 2016-06-16 NOTE — Telephone Encounter (Signed)
Patient is currently on Orencia infusions at [facility].  Current pre-certification is through 07/09/16.  [employer] is implementing new site of care guidelines which will not allow patient to get infusions through [facility] anymore.    I called patient to discuss this with her.  Discussed options to refer her to outpatient infusion clinic at [facility] or change her to subcutaneous Orencia.  Patient reports she would prefer to continue Orencia infusions if possible.    Dr. Estanislado Pandy, okay to refer her to [facility] for Orencia infusions?

## 2016-06-16 NOTE — Telephone Encounter (Signed)
Okay 

## 2016-06-16 NOTE — Telephone Encounter (Signed)
Referral has been placed. 

## 2016-06-16 NOTE — Telephone Encounter (Signed)
Patient came by the office and signed release of information form.  I will send her information to [employer].    Can you submit referral to [facility] for infusion?

## 2016-06-17 LAB — QUANTIFERON IN TUBE
QFT TB AG MINUS NIL VALUE: 0.01 [IU]/mL
QUANTIFERON NIL VALUE: 0.02 [IU]/mL
QUANTIFERON TB AG VALUE: 0.03 [IU]/mL
QUANTIFERON TB GOLD: NEGATIVE

## 2016-06-17 LAB — QUANTIFERON TB GOLD ASSAY (BLOOD)

## 2016-06-17 NOTE — Progress Notes (Signed)
WNL

## 2016-06-21 ENCOUNTER — Telehealth: Payer: Self-pay | Admitting: Radiology

## 2016-06-21 NOTE — Telephone Encounter (Signed)
I have called patient to advise labs are normal  

## 2016-06-22 ENCOUNTER — Ambulatory Visit (INDEPENDENT_AMBULATORY_CARE_PROVIDER_SITE_OTHER): Payer: BLUE CROSS/BLUE SHIELD | Admitting: Neurology

## 2016-06-22 ENCOUNTER — Encounter: Payer: Self-pay | Admitting: Neurology

## 2016-06-22 VITALS — BP 113/65 | HR 60 | Ht 61.0 in | Wt 130.5 lb

## 2016-06-22 DIAGNOSIS — M0579 Rheumatoid arthritis with rheumatoid factor of multiple sites without organ or systems involvement: Secondary | ICD-10-CM

## 2016-06-22 DIAGNOSIS — M5030 Other cervical disc degeneration, unspecified cervical region: Secondary | ICD-10-CM | POA: Diagnosis not present

## 2016-06-22 DIAGNOSIS — Z79899 Other long term (current) drug therapy: Secondary | ICD-10-CM

## 2016-06-22 DIAGNOSIS — M47812 Spondylosis without myelopathy or radiculopathy, cervical region: Secondary | ICD-10-CM

## 2016-06-22 DIAGNOSIS — Z8669 Personal history of other diseases of the nervous system and sense organs: Secondary | ICD-10-CM | POA: Diagnosis not present

## 2016-06-22 NOTE — Progress Notes (Addendum)
[facility]  [name]. Pick DOB: 11/27/1950  REFERRING DOCTOR OR PCP:  Dr. Estanislado Pandy SOURCE: patient, notes from Dr. Estanislado Pandy, labs  _________________________________   HISTORICAL  CHIEF COMPLAINT:  Chief Complaint  Patient presents with  . Rheumatoid Arthritis    Dr. Estanislado Pandy pt. here for consult for   Orencia infusion in our office.      HISTORY OF PRESENT ILLNESS:  I had the pleasure seeing you patient, Kristy Perez, at [facility] for neurologic consultation regarding Orencia infusions for her rheumatoid arthritis.  She had had problems with joint pain, especially in her feet times many years. Around 2002, she was diagnosed with rheumatoid arthritis after a rheumatoid factor returned positive. He was to start methotrexate and Plaquenil but was diagnosed with breast cancer around that time so those medicines were discontinued while she was undergoing treatment with breast cancer. She had a complete response to her treatment (surgery, chemotherapy and radiation) remains cancer free.   After her chemotherapy, she went back on plaque on until plus methotrexate injections. About 12 years ago, she switched to Humira and was on that for about 8 years. She had many dental issues including abscesses and it was discontinued. Enbrel was tried but it was less effective for her RA. About 4 years ago she started Orencia infusions. She has tolerated them well and has not had any problems with infections. Typically, the day of the infusion she will fill tired or achy. The next day she may also feel tired. The rest of the month she does well..    She is on Orencia 500 mg IV every 4 weeks.    She had her last infusion 06/15/2016.     She has allergies to Iodine injection (xray/CT) and fish.  She does ok with Betadine.    Besides breast cancer, she has had had several Basal cell and squamous cell cancers.   She had Mohs surgery on her face.      She  also has had a Chiari Malformation and was seeing Dr. Carloyn Manner of Neurosurgery.   Because she also has risk of pannus development from her rheumatoid arthritis, and the MRI on an annual or near annual basis. The last MRI of her brain was performed January 2018. I personally reviewed the images. It shows a borderline Chiari malformation (looked mild on prior MRI). The adjacent brain and spinal cord have normal signal.  REVIEW OF SYSTEMS: Constitutional: No fevers, chills, sweats, or change in appetite.    She gets fatigue the last week of each Orencia cycle.     Eyes: No visual changes, double vision, eye pain Ear, nose and throat: No hearing loss, ear pain, nasal congestion, sore throat Cardiovascular: No chest pain, palpitations Respiratory: No shortness of breath at rest or with exertion.   No wheezes GastrointestinaI: No nausea, vomiting, diarrhea, abdominal pain, fecal incontinence.   She has GERD Genitourinary: No dysuria, urinary retention.   She notes urinary frequency and nocturia. Musculoskeletal: as above Integumentary: No rash, pruritus, skin lesions Neurological: as above.   She notes decreased focus, improved with Adderall Psychiatric: No depression at this time.  No anxiety Endocrine: No palpitations, diaphoresis, change in appetite, change in weigh or increased thirst Hematologic/Lymphatic: No anemia, purpura, petechiae. Allergic/Immunologic: No itchy/runny eyes, nasal congestion, recent allergic reactions, rashes  ALLERGIES: Allergies  Allergen Reactions  . Fish Allergy Anaphylaxis  . Iodine Anaphylaxis    " PER PT IV CONTRAST"  . Erythromycin  UNSPECIFIED REACTION   . Sulfonamide Derivatives Other (See Comments)    Tongue turns black.    HOME MEDICATIONS:  Current Outpatient Prescriptions:  .  Abatacept (ORENCIA IV), Inject 500 mg into the vein. Infuse 2 vials / 500 mg over 30 minutes every 4 weeks, Disp: , Rfl:  .  acetaminophen (TYLENOL) 325 MG tablet, Take 650  mg by mouth once. One hour prior to infusion, Disp: , Rfl:  .  amphetamine-dextroamphetamine (ADDERALL) 10 MG tablet, Take 10 mg by mouth daily as needed., Disp: , Rfl: 0 .  conjugated estrogens (PREMARIN) vaginal cream, Use 1/2 gram vaginally twice per week at bedtime, Disp: 30 g, Rfl: 2 .  diphenhydrAMINE (BENADRYL) 25 mg capsule, Take 25 mg by mouth once. One hour prior to infusion, Disp: , Rfl:  .  famotidine (PEPCID) 10 MG tablet, Take 10 mg by mouth daily., Disp: , Rfl:  .  fluticasone (FLONASE) 50 MCG/ACT nasal spray, Place 1 spray into both nostrils daily., Disp: , Rfl:  .  ibuprofen (ADVIL,MOTRIN) 200 MG tablet, Take 400 mg by mouth every 6 (six) hours as needed (for inflammation)., Disp: , Rfl:  .  lidocaine (LIDODERM) 5 %, Place 1 patch onto the skin daily as needed (for shoulder pain). , Disp: , Rfl:  .  Magnesium 250 MG TABS, Take 4 tablets by mouth daily., Disp: , Rfl:  .  mometasone-formoterol (DULERA) 100-5 MCG/ACT AERO, Inhale 1 puff into the lungs daily. , Disp: , Rfl:  .  Omega-3 Fatty Acids (FISH OIL) 1000 MG CAPS, Take 1,000 mg by mouth daily., Disp: , Rfl:  .  predniSONE (DELTASONE) 10 MG tablet, as directed. , Disp: , Rfl:  .  promethazine (PHENERGAN) 25 MG tablet, Take 25 mg by mouth every 6 (six) hours as needed for nausea or vomiting., Disp: , Rfl:  .  RESTASIS 0.05 % ophthalmic emulsion, Place 1 drop into both eyes 2 (two) times daily., Disp: , Rfl:  .  Sodium Fluoride 1.1 % PSTE, Apply small ribbon on tooth brush. Brush for 2 minutes. Expectorate and do not rinse for 25minutes.Do this at least 2x daily., Disp: , Rfl:  .  temazepam (RESTORIL) 30 MG capsule, Take 1 capsule by mouth at bedtime., Disp: , Rfl:  .  VENTOLIN HFA 108 (90 BASE) MCG/ACT inhaler, Inhale 2 puffs into the lungs every 4 (four) hours as needed., Disp: , Rfl:  .  Vitamin D, Ergocalciferol, (DRISDOL) 50000 units CAPS capsule, Take 1 capsule (50,000 Units total) by mouth every 7 (seven) days., Disp: 12  capsule, Rfl: 4  PAST MEDICAL HISTORY: Past Medical History:  Diagnosis Date  . Asthma    inhaler one x per day  . Cancer [facility])    breast, left  . Chiari malformation   . PONV (postoperative nausea and vomiting)   . Rheumatoid arthritis ([place])    on Humira    PAST SURGICAL HISTORY: Past Surgical History:  Procedure Laterality Date  . BREAST SURGERY Left 2003   Lumpectomy T1C1, NO and negative ER/ PR  . COLONOSCOPY  10/04  . COLONOSCOPY  11/10   normal recheck in 5 years  . COLPORRHAPHY    . ESOPHAGOGASTRODUODENOSCOPY ENDOSCOPY  11/10   GERD  . FOOT SURGERY Left age 34   removal of pseudo rheumatoid nodules from left forefoot.  Marland Kitchen LAPAROSCOPIC BILATERAL SALPINGO OOPHERECTOMY N/A 07/30/2013   Procedure: LAPAROSCOPIC BILATERAL SALPINGO OOPHORECTOMY WITH WASHINGS;  Surgeon: Lyman Speller, MD;  Location: [facility];  Service: Gynecology;  Laterality: N/A;  . NASAL SEPTOPLASTY W/ TURBINOPLASTY Bilateral 05/24/2014   Procedure: NASAL SEPTOPLASTY WITH  BILATERAL TURBINATE REDUCTION;  Surgeon: Jerrell Belfast, MD;  Location: [facility];  Service: ENT;  Laterality: Bilateral;  . PELVIC FLOOR REPAIR  3/04   SPARC with AP colporrhaphy  . TOTAL VAGINAL HYSTERECTOMY  1989   with AP repair    FAMILY HISTORY: Family History  Problem Relation Age of Onset  . Diabetes Mother   . Heart failure Mother   . Bladder Cancer Mother   . Stroke Mother   . Cancer Father     head and neck  . Uterine cancer Maternal Aunt   . Bladder Cancer Maternal Grandmother   . Colon cancer Maternal Grandmother     SOCIAL HISTORY:  Social History   Social History  . Marital status: Divorced    Spouse name: N/A  . Number of children: 2  . Years of education: N/A   Occupational History  . Not on file.   Social History Main Topics  . Smoking status: Former Smoker    Types: Cigarettes    Quit date: 02/16/1999  . Smokeless tobacco: Never Used  . Alcohol use No  . Drug use: No  . Sexual activity: No    Other Topics Concern  . Not on file   Social History Narrative   Lives alone in a 2 story home. Has 2 children.  Works as a Radiation protection practitioner.  Education: college.      PHYSICAL EXAM  Vitals:   06/22/16 1602  BP: 113/65  Pulse: 60  Weight: 130 lb 8 oz (59.2 kg)  Height: 5\' 1"  (1.549 m)    Body mass index is 24.66 kg/m.   General: The patient is well-developed and well-nourished and in no acute distress   Neck: The neck is supple, no carotid bruits are noted.  The neck is nontender.  Cardiovascular: The heart has a regular rate and rhythm with a normal S1 and S2. There were no murmurs, gallops or rubs. Lungs are clear to auscultation.  Skin: Extremities are without significant edema.  Musculoskeletal:  Back is nontender.   Joints in her feet and hands are tender.   Ulnar deviation of the fingers bilaterally.  Neurologic Exam  Mental status: The patient is alert and oriented x 3 at the time of the examination. The patient has apparent normal recent and remote memory, with an apparently normal attention span and concentration ability.   Speech is normal.  Cranial nerves: Extraocular movements are full. Facial strength and sensation is normal.  Trapezius and sternocleidomastoid strength is normal. No dysarthria is noted.  The tongue is midline, and the patient has symmetric elevation of the soft palate. No obvious hearing deficits are noted.  Motor:  Muscle bulk is normal.   Tone is normal. Strength is  5 / 5 in all 4 extremities.   Sensory: Sensory testing is intact to pinprick, soft touch and vibration sensation in all 4 extremities.  Coordination: Cerebellar testing reveals good finger-nose-finger and heel-to-shin bilaterally.  Gait and station: Station is normal.   Gait is arthritic. Tandem gait is mildly wide. Romberg is negative.   Reflexes: Deep tendon reflexes are symmetric and normal bilaterally.        DIAGNOSTIC DATA (LABS, IMAGING, TESTING) - I reviewed patient  records, labs, notes, testing and imaging myself where available.  Lab Results  Component Value Date   WBC 7.9 06/15/2016   HGB 11.9 (L) 06/15/2016   HCT 35.8 (  L) 06/15/2016   MCV 83.8 06/15/2016   PLT 347 06/15/2016      Component Value Date/Time   NA 135 06/15/2016 0951   K 4.0 06/15/2016 0951   CL 104 06/15/2016 0951   CO2 23 06/15/2016 0951   GLUCOSE 91 06/15/2016 0951   BUN 10 06/15/2016 0951   CREATININE 0.52 06/15/2016 0951   CALCIUM 9.1 06/15/2016 0951   PROT 6.3 (L) 06/15/2016 0951   ALBUMIN 3.7 06/15/2016 0951   AST 24 06/15/2016 0951   ALT 19 06/15/2016 0951   ALKPHOS 64 06/15/2016 0951   BILITOT 0.7 06/15/2016 0951   GFRNONAA >60 06/15/2016 0951   GFRAA >60 06/15/2016 0951   Lab Results  Component Value Date   CHOL 238 (H) 03/08/2016   HDL 72 03/08/2016   LDLCALC 145 (H) 03/08/2016   TRIG 104 03/08/2016   CHOLHDL 3.3 03/08/2016   Lab Results  Component Value Date   HGBA1C 5.2 03/08/2016   No results found for: VITAMINB12 Lab Results  Component Value Date   TSH 1.20 03/08/2016       ASSESSMENT AND PLAN  Rheumatoid arthritis with rheumatoid factor of multiple sites without organ or systems involvement ([place])  History of Chiari malformation  High risk medication use  DJD (degenerative joint disease), cervical     1.   Continue Orencia 500 mg every 4 weeks. Her next dose should be at the end of May 2.    She will return to see me in 9 months for her Chiari malformation and reevaluation. We will likely do another MRI around that time. 3.   She will call sooner if she has any new or worsening neurologic symptoms.  Thank you for asking me to see Mrs. Galea for a neurologic consultation. Please let me know if I can be of further assistance with her or other patients in the future.   Coraima Tibbs A. Felecia Shelling, MD, PhD 04/23/2503, 3:97 PM Certified in Neurology, Clinical Neurophysiology, Sleep Medicine, Pain Medicine and Neuroimaging  [facility] [address], [place], [place] [address] [phone]

## 2016-07-08 ENCOUNTER — Telehealth: Payer: Self-pay | Admitting: Rheumatology

## 2016-07-08 NOTE — Telephone Encounter (Signed)
Spoke with patient and told her to contact [facility] to check the status of her infusion pre-certification. Patient voiced understanding and denied any questions at this time.   Azul Brumett, [place], CPhT 4:41 PM

## 2016-07-08 NOTE — Telephone Encounter (Signed)
I reached out to [place] with [facility] to confirm patient's status.  She reported patient was approved and she would have the nurse reach out to her to schedule her appointment.  I informed Sharmon Leyden that patient will be due for her infusion on or after 07/13/16.    I updated patient on this information.  She said she tried to call [facility] and was transferred to a voicemail of the infusion person.  She left a message asking them to call her back.  Advised her to reach back out if she continues to have problems scheduling her infusion.    [name], Pharm.D., BCPS, CPP Clinical Pharmacist Pager: [phone] Phone: [phone] 07/08/2016 4:48 PM

## 2016-07-08 NOTE — Telephone Encounter (Signed)
Patient called inquiring about her pre-authorization.  She stated that she is due for an infusion in about a week. CB#[phone].  Thank you.

## 2016-07-09 NOTE — Telephone Encounter (Signed)
Patient called back to inform me she scheduled her infusion on 07/14/16.  She wanted to know who would do labs.  I advised the orders are to have labs with every other infusion and they will draw the labs at [facility].  Patient voiced understanding and denied any further questions.   [name], Pharm.D., BCPS, CPP Clinical Pharmacist Pager: [phone] Phone: [phone] 07/09/2016 8:58 AM

## 2016-07-14 ENCOUNTER — Encounter (HOSPITAL_COMMUNITY): Payer: BLUE CROSS/BLUE SHIELD

## 2016-08-11 ENCOUNTER — Other Ambulatory Visit: Payer: Self-pay | Admitting: *Deleted

## 2016-08-11 ENCOUNTER — Other Ambulatory Visit (INDEPENDENT_AMBULATORY_CARE_PROVIDER_SITE_OTHER): Payer: Self-pay

## 2016-08-11 DIAGNOSIS — Z79899 Other long term (current) drug therapy: Secondary | ICD-10-CM

## 2016-08-11 DIAGNOSIS — Z0289 Encounter for other administrative examinations: Secondary | ICD-10-CM

## 2016-08-11 DIAGNOSIS — M069 Rheumatoid arthritis, unspecified: Secondary | ICD-10-CM

## 2016-08-12 LAB — CBC WITH DIFFERENTIAL/PLATELET
BASOS: 0 %
Basophils Absolute: 0 10*3/uL (ref 0.0–0.2)
EOS (ABSOLUTE): 0.3 10*3/uL (ref 0.0–0.4)
EOS: 4 %
HEMATOCRIT: 35.9 % (ref 34.0–46.6)
Hemoglobin: 11.6 g/dL (ref 11.1–15.9)
IMMATURE GRANS (ABS): 0 10*3/uL (ref 0.0–0.1)
IMMATURE GRANULOCYTES: 0 %
LYMPHS: 32 %
Lymphocytes Absolute: 2.4 10*3/uL (ref 0.7–3.1)
MCH: 27.9 pg (ref 26.6–33.0)
MCHC: 32.3 g/dL (ref 31.5–35.7)
MCV: 86 fL (ref 79–97)
Monocytes Absolute: 0.5 10*3/uL (ref 0.1–0.9)
Monocytes: 7 %
NEUTROS PCT: 57 %
Neutrophils Absolute: 4.4 10*3/uL (ref 1.4–7.0)
PLATELETS: 315 10*3/uL (ref 150–379)
RBC: 4.16 x10E6/uL (ref 3.77–5.28)
RDW: 13.8 % (ref 12.3–15.4)
WBC: 7.6 10*3/uL (ref 3.4–10.8)

## 2016-08-12 LAB — COMPREHENSIVE METABOLIC PANEL
ALT: 17 IU/L (ref 0–32)
AST: 19 IU/L (ref 0–40)
Albumin/Globulin Ratio: 2 (ref 1.2–2.2)
Albumin: 4 g/dL (ref 3.6–4.8)
Alkaline Phosphatase: 66 IU/L (ref 39–117)
BUN/Creatinine Ratio: 17 (ref 12–28)
BUN: 9 mg/dL (ref 8–27)
Bilirubin Total: 0.3 mg/dL (ref 0.0–1.2)
CALCIUM: 8.7 mg/dL (ref 8.7–10.3)
CO2: 22 mmol/L (ref 20–29)
CREATININE: 0.54 mg/dL — AB (ref 0.57–1.00)
Chloride: 100 mmol/L (ref 96–106)
GFR, EST AFRICAN AMERICAN: 115 mL/min/{1.73_m2} (ref >59)
GFR, EST NON AFRICAN AMERICAN: 100 mL/min/{1.73_m2} (ref >59)
GLOBULIN, TOTAL: 2 g/dL (ref 1.5–4.5)
Glucose: 137 mg/dL — ABNORMAL HIGH (ref 65–99)
Potassium: 4.4 mmol/L (ref 3.5–5.2)
Sodium: 136 mmol/L (ref 134–144)
TOTAL PROTEIN: 6 g/dL (ref 6.0–8.5)

## 2016-08-16 ENCOUNTER — Telehealth: Payer: Self-pay | Admitting: *Deleted

## 2016-08-16 NOTE — Telephone Encounter (Signed)
Duplicate.  I have spoken with pt. this am and given lab results, faxed lab results to her at her request/fim

## 2016-08-16 NOTE — Telephone Encounter (Signed)
-----   Message from Britt Bottom, MD sent at 08/14/2016 10:28 AM EDT ----- Please let the patient know that the lab work is fine.

## 2016-08-16 NOTE — Telephone Encounter (Signed)
I have spoken with pt. and per RAS, advised lab work done in our office is fine.  She verbalized understanding of same.  Per her request, lab results faxed to her at [phone]/fim

## 2016-08-16 NOTE — Telephone Encounter (Signed)
-----   Message from Britt Bottom, MD sent at 08/15/2016 12:29 PM EDT ----- Please let the patient know that the lab work is fine.

## 2016-08-24 NOTE — Progress Notes (Signed)
Office Visit Note  [name]             Date of Birth: [birth year]           MRN: [id number]             PCP: Kelton Pillar, MD Referring: Kelton Pillar, MD Visit Date: 08/25/2016 Occupation: '@GUAROCC'$ @    Subjective:  Medication Management (having infusions at [facility]) Patient is on Many for her rheumatoid arthritis.  History of Present Illness: Kristy Perez is a 66 y.o. female  Last seen Mar 17, 2016 for RA, sero positive.  On that visit, she stated she was well controlled with Orencia IV.   Patient is getting her infusions through [facility] since patient's [facility] would not renew it getting infusions at the hospital.   Patient also sees [facility] because she has Chiari malformation and there addressing that.  Patient reports that her rheumatoid arthritis is doing really well with the Orencia IV. She does not have any joint pain, swelling, stiffness. Her morning stiffness is less than 61mnutes  Patient does report that she gets her infusion every month (about 30 days apart). After her infusion, the first 2 weeks, her rheumatoid arthritis does really well. But she notes that in the third and fourth week after her infusion, she starts to feel little bit more joint pain and it progressively gets worse. She is anxiously awaiting for the next infusion about a week prior to the due date.   Activities of Daily Living:  Patient reports morning stiffness for 5 minutes.   Patient Denies nocturnal pain.  Difficulty dressing/grooming: Denies Difficulty climbing stairs: Denies Difficulty getting out of chair: Denies Difficulty using hands for taps, buttons, cutlery, and/or writing: Denies   Review of Systems  Constitutional: Negative for fatigue.  HENT: Negative for mouth sores and mouth dryness.   Eyes: Negative for dryness.  Respiratory: Negative for shortness of breath.   Gastrointestinal: Negative for  constipation and diarrhea.  Musculoskeletal: Positive for myalgias (right greater trochanteric bursa pain) and myalgias (right greater trochanteric bursa pain).  Skin: Negative for sensitivity to sunlight.  Psychiatric/Behavioral: Negative for decreased concentration and sleep disturbance.    PMFS History:  Patient Active Problem List   Diagnosis Date Noted  . High risk medication use 03/15/2016  . DJD (degenerative joint disease), cervical 03/15/2016  . History of asthma 03/15/2016  . History of squamous cell carcinoma 03/15/2016  . History of basal cell carcinoma 03/15/2016  . History of breast cancer 03/15/2016  . Gastroesophageal reflux disease without esophagitis 03/15/2016  . History of Chiari malformation 03/15/2016  . Vitamin D deficiency 03/15/2016  . Rheumatoid arthritis with rheumatoid factor of multiple sites without organ or systems involvement ([place] 10/20/2015  . Deviated nasal septum 05/24/2014    Class: Chronic  . Insomnia 11/25/2007  . DYSPNEA 11/15/2007    Past Medical History:  Diagnosis Date  . Asthma    inhaler one x per day  . Cancer ([facility]    breast, left  . Chiari malformation   . PONV (postoperative nausea and vomiting)   . Rheumatoid arthritis (HCC)    on Humira    Family History  Problem Relation Age of Onset  . Diabetes Mother   . Heart failure Mother   . Bladder Cancer Mother   . Stroke Mother   . Cancer Father        head and neck  . Uterine cancer Maternal Aunt   .  Bladder Cancer Maternal Grandmother   . Colon cancer Maternal Grandmother    Past Surgical History:  Procedure Laterality Date  . BREAST SURGERY Left 2003   Lumpectomy T1C1, NO and negative ER/ PR  . COLONOSCOPY  10/04  . COLONOSCOPY  11/10   normal recheck in 5 years  . COLPORRHAPHY    . ESOPHAGOGASTRODUODENOSCOPY ENDOSCOPY  11/10   GERD  . FOOT SURGERY Left age 18   removal of pseudo rheumatoid nodules from left forefoot.  Marland Kitchen LAPAROSCOPIC BILATERAL SALPINGO  OOPHERECTOMY N/A 07/30/2013   Procedure: LAPAROSCOPIC BILATERAL SALPINGO OOPHORECTOMY WITH WASHINGS;  Surgeon: Lyman Speller, MD;  Location: [facility];  Service: Gynecology;  Laterality: N/A;  . NASAL SEPTOPLASTY W/ TURBINOPLASTY Bilateral 05/24/2014   Procedure: NASAL SEPTOPLASTY WITH  BILATERAL TURBINATE REDUCTION;  Surgeon: Jerrell Belfast, MD;  Location: [facility];  Service: ENT;  Laterality: Bilateral;  . PELVIC FLOOR REPAIR  3/04   SPARC with AP colporrhaphy  . TOTAL VAGINAL HYSTERECTOMY  1989   with AP repair   Social History   Social History Narrative   Lives alone in a 2 story home. Has 2 children.  Works as a Radiation protection practitioner.  Education: college.     Objective: Vital Signs: BP 110/60   Pulse 80   Resp 14   Ht 5' 1.5" (1.562 m)   Wt 128 lb (58.1 kg)   BMI 23.79 kg/m    Physical Exam  Constitutional: She is oriented to person, place, and time. She appears well-developed and well-nourished.  HENT:  Head: Normocephalic and atraumatic.  Eyes: EOM are normal. Pupils are equal, round, and reactive to light.  Cardiovascular: Normal rate, regular rhythm and normal heart sounds.  Exam reveals no gallop and no friction rub.   No murmur heard. Pulmonary/Chest: Effort normal and breath sounds normal. She has no wheezes. She has no rales.  Abdominal: Soft. Bowel sounds are normal. She exhibits no distension. There is no tenderness. There is no guarding. No hernia.  Musculoskeletal: Normal range of motion. She exhibits tenderness (right greater trochanter bursa pain). She exhibits no edema or deformity.  Lymphadenopathy:    She has no cervical adenopathy.  Neurological: She is alert and oriented to person, place, and time. Coordination normal.  Skin: Skin is warm and dry. Capillary refill takes less than 2 seconds. No rash noted.  Psychiatric: She has a normal mood and affect. Her behavior is normal.  Nursing note and vitals reviewed.    Musculoskeletal Exam:  Full range of motion of all  joints Grip strength is equal and strong bilaterally Fiber myalgia tender points are absent except right greater trochanteric bursa is very tender palpation.  CDAI Exam: CDAI Homunculus Exam:   Joint Counts:  CDAI Tender Joint count: 0 CDAI Swollen Joint count: 0   No synovitis on examination. Patient is due for monthly infusion of Orencia next week.  Investigation: No additional findings. Appointment on 08/11/2016  Component Date Value Ref Range Status  . WBC 08/11/2016 7.6  3.4 - 10.8 x10E3/uL Final  . RBC 08/11/2016 4.16  3.77 - 5.28 x10E6/uL Final  . Hemoglobin 08/11/2016 11.6  11.1 - 15.9 g/dL Final  . Hematocrit 08/11/2016 35.9  34.0 - 46.6 % Final  . MCV 08/11/2016 86  79 - 97 fL Final  . MCH 08/11/2016 27.9  26.6 - 33.0 pg Final  . MCHC 08/11/2016 32.3  31.5 - 35.7 g/dL Final  . RDW 08/11/2016 13.8  12.3 - 15.4 % Final  . Platelets  08/11/2016 315  150 - 379 x10E3/uL Final  . Neutrophils 08/11/2016 57  Not Estab. % Final  . Lymphs 08/11/2016 32  Not Estab. % Final  . Monocytes 08/11/2016 7  Not Estab. % Final  . Eos 08/11/2016 4  Not Estab. % Final  . Basos 08/11/2016 0  Not Estab. % Final  . Neutrophils Absolute 08/11/2016 4.4  1.4 - 7.0 x10E3/uL Final  . Lymphocytes Absolute 08/11/2016 2.4  0.7 - 3.1 x10E3/uL Final  . Monocytes Absolute 08/11/2016 0.5  0.1 - 0.9 x10E3/uL Final  . EOS (ABSOLUTE) 08/11/2016 0.3  0.0 - 0.4 x10E3/uL Final  . Basophils Absolute 08/11/2016 0.0  0.0 - 0.2 x10E3/uL Final  . Immature Granulocytes 08/11/2016 0  Not Estab. % Final  . Immature Grans (Abs) 08/11/2016 0.0  0.0 - 0.1 x10E3/uL Final  . Glucose 08/11/2016 137* 65 - 99 mg/dL Final  . BUN 88/89/1694 9  8 - 27 mg/dL Final  . Creatinine, Ser 08/11/2016 0.54* 0.57 - 1.00 mg/dL Final  . GFR calc non Af Amer 08/11/2016 100  >59 mL/min/1.73 Final  . GFR calc Af Amer 08/11/2016 115  >59 mL/min/1.73 Final  . BUN/Creatinine Ratio 08/11/2016 17  12 - 28 Final  . Sodium 08/11/2016 136  134 -  144 mmol/L Final  . Potassium 08/11/2016 4.4  3.5 - 5.2 mmol/L Final  . Chloride 08/11/2016 100  96 - 106 mmol/L Final  . CO2 08/11/2016 22  20 - 29 mmol/L Final                 **Please note reference interval change**  . Calcium 08/11/2016 8.7  8.7 - 10.3 mg/dL Final  . Total Protein 08/11/2016 6.0  6.0 - 8.5 g/dL Final  . Albumin 50/38/8828 4.0  3.6 - 4.8 g/dL Final  . Globulin, Total 08/11/2016 2.0  1.5 - 4.5 g/dL Final  . Albumin/Globulin Ratio 08/11/2016 2.0  1.2 - 2.2 Final  . Bilirubin Total 08/11/2016 0.3  0.0 - 1.2 mg/dL Final  . Alkaline Phosphatase 08/11/2016 66  39 - 117 IU/L Final  . AST 08/11/2016 19  0 - 40 IU/L Final  . ALT 08/11/2016 17  0 - 32 IU/L Final  Hospital Outpatient Visit on 06/15/2016  Component Date Value Ref Range Status  . WBC 06/15/2016 7.9  4.0 - 10.5 K/uL Final  . RBC 06/15/2016 4.27  3.87 - 5.11 MIL/uL Final  . Hemoglobin 06/15/2016 11.9* 12.0 - 15.0 g/dL Final  . HCT 00/34/9179 35.8* 36.0 - 46.0 % Final  . MCV 06/15/2016 83.8  78.0 - 100.0 fL Final  . MCH 06/15/2016 27.9  26.0 - 34.0 pg Final  . MCHC 06/15/2016 33.2  30.0 - 36.0 g/dL Final  . RDW 15/06/6977 13.4  11.5 - 15.5 % Final  . Platelets 06/15/2016 347  150 - 400 K/uL Final  . Neutrophils Relative % 06/15/2016 54  % Final  . Neutro Abs 06/15/2016 4.3  1.7 - 7.7 K/uL Final  . Lymphocytes Relative 06/15/2016 31  % Final  . Lymphs Abs 06/15/2016 2.4  0.7 - 4.0 K/uL Final  . Monocytes Relative 06/15/2016 10  % Final  . Monocytes Absolute 06/15/2016 0.8  0.1 - 1.0 K/uL Final  . Eosinophils Relative 06/15/2016 5  % Final  . Eosinophils Absolute 06/15/2016 0.4  0.0 - 0.7 K/uL Final  . Basophils Relative 06/15/2016 0  % Final  . Basophils Absolute 06/15/2016 0.0  0.0 - 0.1 K/uL Final  . QUANTIFERON INCUBATION 06/15/2016  Comment   Final   Comment: (NOTE) Specimen incubated at [place], [place], [place]. Performed At: [facility] [place], [place] [id number] Lindon Romp MD VF:[id number]   . Sodium 06/15/2016 135  135 - 145 mmol/L Final  . Potassium 06/15/2016 4.0  3.5 - 5.1 mmol/L Final  . Chloride 06/15/2016 104  101 - 111 mmol/L Final  . CO2 06/15/2016 23  22 - 32 mmol/L Final  . Glucose, Bld 06/15/2016 91  65 - 99 mg/dL Final  . BUN 06/15/2016 10  6 - 20 mg/dL Final  . Creatinine, Ser 06/15/2016 0.52  0.44 - 1.00 mg/dL Final  . Calcium 06/15/2016 9.1  8.9 - 10.3 mg/dL Final  . Total Protein 06/15/2016 6.3* 6.5 - 8.1 g/dL Final  . Albumin 06/15/2016 3.7  3.5 - 5.0 g/dL Final  . AST 06/15/2016 24  15 - 41 U/L Final  . ALT 06/15/2016 19  14 - 54 U/L Final  . Alkaline Phosphatase 06/15/2016 64  38 - 126 U/L Final  . Total Bilirubin 06/15/2016 0.7  0.3 - 1.2 mg/dL Final  . GFR calc non Af Amer 06/15/2016 >60  >60 mL/min Final  . GFR calc Af Amer 06/15/2016 >60  >60 mL/min Final   Comment: (NOTE) The eGFR has been calculated using the CKD EPI equation. This calculation has not been validated in all clinical situations. eGFR's persistently <60 mL/min signify possible Chronic Kidney Disease.   . Anion gap 06/15/2016 8  5 - 15 Final  . QUANTIFERON TB GOLD 06/15/2016 Negative  Negative Final  . QUANTIFERON CRITERIA 06/15/2016 Comment   Final   Comment: (NOTE) To be considered positive a specimen should have a TB Ag minus Nil value greater than or equal to 0.35 IU/mL and in addition the TB Ag minus Nil value must be greater than or equal to 25% of the Nil value. There may be insufficient information in these values to differentiate between some negative and some indeterminate test values.   . QUANTIFERON TB AG VALUE 06/15/2016 0.03  IU/mL Final  . Quantiferon Nil Value 06/15/2016 0.02  IU/mL Final  . QUANTIFERON MITOGEN VALUE 06/15/2016 >10.00  IU/mL Final  . QFT TB AG MINUS NIL VALUE 06/15/2016 0.01  IU/mL Final  . Interpretation: 06/15/2016 Comment   Final   Comment: (NOTE) The QuantiFERON TB Gold (in Tube) assay is intended for use as an  aid in the diagnosis of TB infection. Negative results suggest that there is no TB infection. In patients with high suspicion of exposure, a negative test should be repeated. A positive test indicates infection with Mycobacterium tuberculosis. Among individuals without tuberculosis infection, a positive test may be due to exposure to [place], M. szulgai or M. marinum. On the Internet, go to https://figueroa-lambert.info/ for further details. Performed At: [facility] [place], [place] [id number] [place] SW:[id number]   Hospital Outpatient Visit on 04/28/2016  Component Date Value Ref Range Status  . WBC 04/28/2016 7.7  4.0 - 10.5 K/uL Final  . RBC 04/28/2016 4.37  3.87 - 5.11 MIL/uL Final  . Hemoglobin 04/28/2016 12.2  12.0 - 15.0 g/dL Final  . HCT 04/28/2016 36.4  36.0 - 46.0 % Final  . MCV 04/28/2016 83.3  78.0 - 100.0 fL Final  . MCH 04/28/2016 27.9  26.0 - 34.0 pg Final  . MCHC 04/28/2016 33.5  30.0 - 36.0 g/dL Final  . RDW 04/28/2016 13.1  11.5 - 15.5 % Final  .  Platelets 04/28/2016 387  150 - 400 K/uL Final  . Neutrophils Relative % 04/28/2016 61  % Final  . Neutro Abs 04/28/2016 4.8  1.7 - 7.7 K/uL Final  . Lymphocytes Relative 04/28/2016 26  % Final  . Lymphs Abs 04/28/2016 2.0  0.7 - 4.0 K/uL Final  . Monocytes Relative 04/28/2016 10  % Final  . Monocytes Absolute 04/28/2016 0.8  0.1 - 1.0 K/uL Final  . Eosinophils Relative 04/28/2016 3  % Final  . Eosinophils Absolute 04/28/2016 0.2  0.0 - 0.7 K/uL Final  . Basophils Relative 04/28/2016 0  % Final  . Basophils Absolute 04/28/2016 0.0  0.0 - 0.1 K/uL Final  . Sodium 04/28/2016 136  135 - 145 mmol/L Final  . Potassium 04/28/2016 3.8  3.5 - 5.1 mmol/L Final  . Chloride 04/28/2016 105  101 - 111 mmol/L Final  . CO2 04/28/2016 22  22 - 32 mmol/L Final  . Glucose, Bld 04/28/2016 121* 65 - 99 mg/dL Final  . BUN 04/28/2016 8  6 - 20 mg/dL Final  . Creatinine, Ser 04/28/2016 0.56  0.44 - 1.00 mg/dL Final  .  Calcium 04/28/2016 8.9  8.9 - 10.3 mg/dL Final  . Total Protein 04/28/2016 6.1* 6.5 - 8.1 g/dL Final  . Albumin 04/28/2016 3.8  3.5 - 5.0 g/dL Final  . AST 04/28/2016 23  15 - 41 U/L Final  . ALT 04/28/2016 18  14 - 54 U/L Final  . Alkaline Phosphatase 04/28/2016 59  38 - 126 U/L Final  . Total Bilirubin 04/28/2016 0.6  0.3 - 1.2 mg/dL Final  . GFR calc non Af Amer 04/28/2016 >60  >60 mL/min Final  . GFR calc Af Amer 04/28/2016 >60  >60 mL/min Final   Comment: (NOTE) The eGFR has been calculated using the CKD EPI equation. This calculation has not been validated in all clinical situations. eGFR's persistently <60 mL/min signify possible Chronic Kidney Disease.   . Anion gap 04/28/2016 9  5 - 15 Final  Appointment on 03/08/2016  Component Date Value Ref Range Status  . Cholesterol 03/08/2016 238* <200 mg/dL Final  . Triglycerides 03/08/2016 104  <150 mg/dL Final  . HDL 03/08/2016 72  >50 mg/dL Final  . Total CHOL/HDL Ratio 03/08/2016 3.3  <5.0 Ratio Final  . VLDL 03/08/2016 21  <30 mg/dL Final  . LDL Cholesterol 03/08/2016 145* <100 mg/dL Final  . Hgb A1c MFr Bld 03/08/2016 5.2  <5.7 % Final   Comment:   For the purpose of screening for the presence of diabetes:   <5.7%       Consistent with the absence of diabetes 5.7-6.4 %   Consistent with increased risk for diabetes (prediabetes) >=6.5 %     Consistent with diabetes   This assay result is consistent with a decreased risk of diabetes.   Currently, no consensus exists regarding use of hemoglobin A1c for diagnosis of diabetes in children.   According to American Diabetes Association (ADA) guidelines, hemoglobin A1c <7.0% represents optimal control in non-pregnant diabetic patients. Different metrics may apply to specific patient populations. Standards of Medical Care in Diabetes (ADA).     . Mean Plasma Glucose 03/08/2016 103  mg/dL Final  . Vit D, 25-Hydroxy 03/08/2016 33  30 - 100 ng/mL Final   Comment: Vitamin D Status            25-OH Vitamin D        Deficiency                <  20 ng/mL        Insufficiency         20 - 29 ng/mL        Optimal             > or = 30 ng/mL   For 25-OH Vitamin D testing on patients on D2-supplementation and patients for whom quantitation of D2 and D3 fractions is required, the QuestAssureD 25-OH VIT D, (D2,D3), LC/MS/MS is recommended: order code [phone] (patients > 2 yrs).   . TSH 03/08/2016 1.20  mIU/L Final   Comment:   Reference Range   > or = 20 Years  0.40-4.50   Pregnancy Range First trimester  0.26-2.66 Second trimester 0.55-2.73 Third trimester  0.43-2.91     . HCV Ab 03/08/2016 NEGATIVE  NEGATIVE Final  Hospital Outpatient Visit on 03/01/2016  Component Date Value Ref Range Status  . WBC 03/01/2016 7.4  4.0 - 10.5 K/uL Final  . RBC 03/01/2016 4.53  3.87 - 5.11 MIL/uL Final  . Hemoglobin 03/01/2016 12.8  12.0 - 15.0 g/dL Final  . HCT 03/01/2016 37.9  36.0 - 46.0 % Final  . MCV 03/01/2016 83.7  78.0 - 100.0 fL Final  . MCH 03/01/2016 28.3  26.0 - 34.0 pg Final  . MCHC 03/01/2016 33.8  30.0 - 36.0 g/dL Final  . RDW 03/01/2016 13.0  11.5 - 15.5 % Final  . Platelets 03/01/2016 350  150 - 400 K/uL Final  . Neutrophils Relative % 03/01/2016 64  % Final  . Neutro Abs 03/01/2016 4.7  1.7 - 7.7 K/uL Final  . Lymphocytes Relative 03/01/2016 27  % Final  . Lymphs Abs 03/01/2016 2.0  0.7 - 4.0 K/uL Final  . Monocytes Relative 03/01/2016 6  % Final  . Monocytes Absolute 03/01/2016 0.4  0.1 - 1.0 K/uL Final  . Eosinophils Relative 03/01/2016 3  % Final  . Eosinophils Absolute 03/01/2016 0.2  0.0 - 0.7 K/uL Final  . Basophils Relative 03/01/2016 0  % Final  . Basophils Absolute 03/01/2016 0.0  0.0 - 0.1 K/uL Final  . Sodium 03/01/2016 137  135 - 145 mmol/L Final  . Potassium 03/01/2016 4.5  3.5 - 5.1 mmol/L Final  . Chloride 03/01/2016 105  101 - 111 mmol/L Final  . CO2 03/01/2016 22  22 - 32 mmol/L Final  . Glucose, Bld 03/01/2016 100* 65 - 99 mg/dL Final  .  BUN 03/01/2016 10  6 - 20 mg/dL Final  . Creatinine, Ser 03/01/2016 0.50  0.44 - 1.00 mg/dL Final  . Calcium 03/01/2016 9.3  8.9 - 10.3 mg/dL Final  . Total Protein 03/01/2016 6.4* 6.5 - 8.1 g/dL Final  . Albumin 03/01/2016 3.6  3.5 - 5.0 g/dL Final  . AST 03/01/2016 21  15 - 41 U/L Final  . ALT 03/01/2016 13* 14 - 54 U/L Final  . Alkaline Phosphatase 03/01/2016 62  38 - 126 U/L Final  . Total Bilirubin 03/01/2016 0.7  0.3 - 1.2 mg/dL Final  . GFR calc non Af Amer 03/01/2016 >60  >60 mL/min Final  . GFR calc Af Amer 03/01/2016 >60  >60 mL/min Final   Comment: (NOTE) The eGFR has been calculated using the CKD EPI equation. This calculation has not been validated in all clinical situations. eGFR's persistently <60 mL/min signify possible Chronic Kidney Disease.   . Anion gap 03/01/2016 10  5 - 15 Final  Office Visit on 02/27/2016  Component Date Value Ref Range Status  . Color, UA 02/27/2016  yellow   Final  . Clarity, UA 02/27/2016 clear   Final  . Glucose, UA 02/27/2016 n   Final  . Bilirubin, UA 02/27/2016 n   Final  . Ketones, UA 02/27/2016 n   Final  . Blood, UA 02/27/2016 n   Final  . pH, UA 02/27/2016 8.0   Final  . Protein, UA 02/27/2016 n   Final  . Urobilinogen, UA 02/27/2016 negative   Final  . Nitrite, UA 02/27/2016 n   Final  . Leukocytes, UA 02/27/2016 Negative  Negative Final     Imaging: No results found.  Speciality Comments: No specialty comments available.    Procedures:  Large Joint Inj Date/Time: 08/25/2016 1:53 PM Performed by: Eliezer Lofts Authorized by: Eliezer Lofts   Consent Given by:  Patient Site marked: the procedure site was marked   Timeout: prior to procedure the correct patient, procedure, and site was verified   Indications:  Pain Location:  Hip Site:  R greater trochanter Prep: patient was prepped and draped in usual sterile fashion   Needle Size:  27 G Approach:  Superior Ultrasound Guidance: No   Fluoroscopic Guidance: No    Arthrogram: No   Medications:  40 mg triamcinolone acetonide 40 MG/ML; 2 mL lidocaine 2 % Aspiration Attempted: Yes   Aspirate amount (mL):  0 Patient tolerance:  Patient tolerated the procedure well with no immediate complications  Pain to the right greater trochanteric bursa described as 10 on a scale of 0-10. Patient tolerated this procedure well. There is no complication. 1 minute after the injection, patient describes her pain as 7 on a scale of 0-10.   Allergies: Fish allergy; Iodine; Erythromycin; and Sulfonamide derivatives   Assessment / Plan:     Visit Diagnoses: Rheumatoid arthritis with rheumatoid factor of multiple sites without organ or systems involvement (HCC)  High risk medication use  Trochanteric bursitis of right hip  Pain in right hip  History of Chiari malformation  History of basal cell carcinoma  History of squamous cell carcinoma  History of breast cancer    Plan: #1: Rheumatoid arthritis with rheumatoid factor positive Patient is doing well with her RA. No joint pain, swelling, stiffness. No synovitis on today's examination. She does report that about a week before she is due for her Orencia infusion, she has pain to her joints (especially bilateral feet with left worse than right) that she rates as a 3 on a scale of 0-10  #2: High risk prescription Orencia IV every month Labs were done June 2018 and was within normal limits. TB gold done May 2018 and was negative  #3: History of greater trochanteric bursitis. Her right greater trochanteric bursa is very painful today and she rates her pain as 10 on a scale of 0-10. Please see procedure notes for full details.  #4: Return to clinic in 4 months.  #5: Schedule ultrasound of bilateral feet to rule out synovitis.  Orders: Orders Placed This Encounter  Procedures  . Large Joint Injection/Arthrocentesis   No orders of the defined types were placed in this encounter.   Face-to-face time  spent with patient was 30 minutes. 50% of time was spent in counseling and coordination of care.  Follow-Up Instructions: Return in about 4 months (around 12/26/2016) for RA,orencia, rt hip pain, rt gr tr bursitis.   Eliezer Lofts, PA-C  Note - This record has been created using [employer].  Chart creation errors have been sought, but may not always  have been  located. Such creation errors do not reflect on  the standard of medical care.

## 2016-08-25 ENCOUNTER — Ambulatory Visit: Payer: BLUE CROSS/BLUE SHIELD | Admitting: Rheumatology

## 2016-08-25 ENCOUNTER — Encounter: Payer: Self-pay | Admitting: Rheumatology

## 2016-08-25 ENCOUNTER — Ambulatory Visit (INDEPENDENT_AMBULATORY_CARE_PROVIDER_SITE_OTHER): Payer: BLUE CROSS/BLUE SHIELD | Admitting: Rheumatology

## 2016-08-25 VITALS — BP 110/60 | HR 80 | Resp 14 | Ht 61.5 in | Wt 128.0 lb

## 2016-08-25 DIAGNOSIS — M25551 Pain in right hip: Secondary | ICD-10-CM

## 2016-08-25 DIAGNOSIS — M7061 Trochanteric bursitis, right hip: Secondary | ICD-10-CM

## 2016-08-25 DIAGNOSIS — Z79899 Other long term (current) drug therapy: Secondary | ICD-10-CM | POA: Diagnosis not present

## 2016-08-25 DIAGNOSIS — Z8669 Personal history of other diseases of the nervous system and sense organs: Secondary | ICD-10-CM

## 2016-08-25 DIAGNOSIS — Z85828 Personal history of other malignant neoplasm of skin: Secondary | ICD-10-CM

## 2016-08-25 DIAGNOSIS — Z853 Personal history of malignant neoplasm of breast: Secondary | ICD-10-CM | POA: Diagnosis not present

## 2016-08-25 DIAGNOSIS — M0579 Rheumatoid arthritis with rheumatoid factor of multiple sites without organ or systems involvement: Secondary | ICD-10-CM | POA: Diagnosis not present

## 2016-08-25 DIAGNOSIS — Z8589 Personal history of malignant neoplasm of other organs and systems: Secondary | ICD-10-CM | POA: Diagnosis not present

## 2016-08-25 MED ORDER — LIDOCAINE HCL 2 % IJ SOLN
2.0000 mL | INTRAMUSCULAR | Status: AC | PRN
  Administered 2016-08-25: 2 mL

## 2016-08-25 MED ORDER — TRIAMCINOLONE ACETONIDE 40 MG/ML IJ SUSP
40.0000 mg | INTRAMUSCULAR | Status: AC | PRN
  Administered 2016-08-25: 40 mg via INTRA_ARTICULAR

## 2016-08-25 NOTE — Progress Notes (Signed)
Rheumatology Medication Review by a Pharmacist Does the patient feel that his/her medications are working for him/her?  Yes Has the patient been experiencing any side effects to the medications prescribed?  No Does the patient have any problems obtaining medications?  No  Issues to address at subsequent visits: None   Pharmacist comments:  Kristy Perez is a pleasant 66 yo F who presents for follow up.  She is currently receiving Orencia infusions 500 mg every 4 weeks at [facility].  We had to transition patient from [facility] to [facility] as her insurance would no longer cover her insurance at the hospital outpatient clinic.  Patient reports the infusions at [facility] are going well.  Most recent standing labs were normal on 08/11/16.  Patient gets her labs with every other infusion.  Most recent TB Gold negative on 06/15/16.  She will be due for TB Gold again in May 2019.    Patient reports she will be transitioning insurance to [employer] [facility] [employer] in August.  She signed up for a medicare supplement plan.  She would like to continue to infuse at [facility] if possible.  I advised patient that she needs to let [facility] know about her new insurance as soon as it goes into effect and before she infuses under the new insurance to ensure that her new insurance will cover her infusions.  Patient voiced understanding.  She denies any questions or concerns regarding her medications at this time.   [name], Pharm.D., BCPS, CPP Clinical Pharmacist Pager: [phone] Phone: [phone] 08/25/2016 3:48 PM

## 2016-09-03 ENCOUNTER — Ambulatory Visit (INDEPENDENT_AMBULATORY_CARE_PROVIDER_SITE_OTHER): Payer: Self-pay

## 2016-09-03 ENCOUNTER — Ambulatory Visit (INDEPENDENT_AMBULATORY_CARE_PROVIDER_SITE_OTHER): Payer: BLUE CROSS/BLUE SHIELD | Admitting: Orthopaedic Surgery

## 2016-09-03 ENCOUNTER — Encounter (INDEPENDENT_AMBULATORY_CARE_PROVIDER_SITE_OTHER): Payer: Self-pay | Admitting: Orthopaedic Surgery

## 2016-09-03 VITALS — BP 119/44 | HR 77 | Ht 64.0 in | Wt 140.0 lb

## 2016-09-03 DIAGNOSIS — M25552 Pain in left hip: Secondary | ICD-10-CM | POA: Diagnosis not present

## 2016-09-03 DIAGNOSIS — G8929 Other chronic pain: Secondary | ICD-10-CM | POA: Diagnosis not present

## 2016-09-03 DIAGNOSIS — M5441 Lumbago with sciatica, right side: Secondary | ICD-10-CM

## 2016-09-03 NOTE — Progress Notes (Signed)
Office Visit Note   [name]           Date of Birth: 01/27/1951           MRN: [id number] Visit Date: 09/03/2016              Requested by: Kristy Pillar, MD [place] [address] [place], [place] [address] PCP: Kristy Pillar, MD   Assessment & Plan: Visit Diagnoses:  1. Pain in left hip   2. Chronic bilateral low back pain with right-sided sciatica   Mild arthritis right hip. Chronic degenerative disc disease L5-S1 with an anterior listhesis  Plan: MRI scan lumbar spine  Follow-Up Instructions: No Follow-up on file.   Orders:  Orders Placed This Encounter  Procedures  . XR Lumbar Spine 2-3 Views  . XR HIP UNILAT W OR W/O PELVIS 2-3 VIEWS RIGHT   No orders of the defined types were placed in this encounter.     Procedures: No procedures performed   Clinical Data: No additional findings.   Subjective: Chief Complaint  Patient presents with  . Left Hip - Pain, Weakness    Kristy Perez is a 66 y o that presents with Chronic L hip pain with hx of RA and joint pain.  Kristy Perez is a history of rheumatoid arthritis and is presently being treated with Orencia. She's had a recurrent history of right greater than left hip pain. In the past cortisone injections it made a big difference. She recently saw Dr. Estanislado Perez for an injection that lasted " not quite as long". On occasion she's had some right groin pain. She's also a chronic problems with her back. She remembers receiving injections many many years ago. She also relates that on occasion the pain will radiate into her right leg sometimes as far distally as her ankle. She's not had any history of injury or trauma. History of breast cancer Prior history of bone island by x-ray in her right hip. Bone scan is consistent with the bone island  HPI  Review of Systems   Objective: Vital Signs: BP (!) 119/44   Pulse 77   Ht 5\' 4"  (1.626 m)   Wt 140 lb (63.5 kg)   BMI 24.03 kg/m   Physical  Exam  Ortho Exam awake alert and oriented 3 comfortable in a sitting position. Painless range of motion of both hips with internal and external rotation. No limitations. No percussible tenderness of lumbar spine cross leg test was negative no pain over either sacroiliac joint. Straight leg raise negative. Neurovascular exam intact. No knee pain no palpable tenderness over the greater trochanters of either hip  Specialty Comments:  No specialty comments available.  Imaging: Xr Hip Unilat W Or W/o Pelvis 2-3 Views Right  Result Date: 09/03/2016 AP the pelvis and lateral the right hip demonstrate minimal degenerative changes superiorly of the right hip. There is an old bone island in the area below the intertrochanteric region that was previously evaluated the MRI and bone scan and felt to be a bone island  Xr Lumbar Spine 2-3 Views  Result Date: 09/03/2016 Films of the lumbar spine obtained the AP and lateral projections. There is significant decrease in the disc space height between L5 and S1 associated with a grade 2 anterior listhesis. There is to be a pars defect    PMFS History: Patient Active Problem List   Diagnosis Date Noted  . High risk medication use 03/15/2016  . DJD (degenerative joint disease),  cervical 03/15/2016  . History of asthma 03/15/2016  . History of squamous cell carcinoma 03/15/2016  . History of basal cell carcinoma 03/15/2016  . History of breast cancer 03/15/2016  . Gastroesophageal reflux disease without esophagitis 03/15/2016  . History of Chiari malformation 03/15/2016  . Vitamin D deficiency 03/15/2016  . Rheumatoid arthritis with rheumatoid factor of multiple sites without organ or systems involvement ([place]) 10/20/2015  . Deviated nasal septum 05/24/2014    Class: Chronic  . Insomnia 11/25/2007  . DYSPNEA 11/15/2007   Past Medical History:  Diagnosis Date  . Asthma    inhaler one x per day  . Cancer [facility])    breast, left  . Chiari malformation    . PONV (postoperative nausea and vomiting)   . Rheumatoid arthritis (HCC)    on Humira    Family History  Problem Relation Age of Onset  . Diabetes Mother   . Heart failure Mother   . Bladder Cancer Mother   . Stroke Mother   . Cancer Father        head and neck  . Uterine cancer Maternal Aunt   . Bladder Cancer Maternal Grandmother   . Colon cancer Maternal Grandmother     Past Surgical History:  Procedure Laterality Date  . BREAST SURGERY Left 2003   Lumpectomy T1C1, NO and negative ER/ PR  . COLONOSCOPY  10/04  . COLONOSCOPY  11/10   normal recheck in 5 years  . COLPORRHAPHY    . ESOPHAGOGASTRODUODENOSCOPY ENDOSCOPY  11/10   GERD  . FOOT SURGERY Left age 14   removal of pseudo rheumatoid nodules from left forefoot.  Marland Kitchen LAPAROSCOPIC BILATERAL SALPINGO OOPHERECTOMY N/A 07/30/2013   Procedure: LAPAROSCOPIC BILATERAL SALPINGO OOPHORECTOMY WITH WASHINGS;  Surgeon: Lyman Speller, MD;  Location: [facility];  Service: Gynecology;  Laterality: N/A;  . NASAL SEPTOPLASTY W/ TURBINOPLASTY Bilateral 05/24/2014   Procedure: NASAL SEPTOPLASTY WITH  BILATERAL TURBINATE REDUCTION;  Surgeon: Jerrell Belfast, MD;  Location: [facility];  Service: ENT;  Laterality: Bilateral;  . PELVIC FLOOR REPAIR  3/04   SPARC with AP colporrhaphy  . TOTAL VAGINAL HYSTERECTOMY  1989   with AP repair   Social History   Occupational History  . Not on file.   Social History Main Topics  . Smoking status: Former Smoker    Types: Cigarettes    Quit date: 02/16/1999  . Smokeless tobacco: Never Used  . Alcohol use No  . Drug use: No  . Sexual activity: No     Garald Balding, MD   Note - This record has been created using [employer].  Chart creation errors have been sought, but may not always  have been located. Such creation errors do not reflect on  the standard of medical care.

## 2016-09-03 NOTE — Addendum Note (Signed)
Addended by: Mervyn Skeeters on: 09/03/2016 09:08 AM   Modules accepted: Orders

## 2016-09-15 ENCOUNTER — Ambulatory Visit
Admission: RE | Admit: 2016-09-15 | Discharge: 2016-09-15 | Disposition: A | Discharge status: Home / Self Care | Payer: Medicare Other | Source: Ambulatory Visit | Attending: Orthopaedic Surgery | Admitting: Orthopaedic Surgery

## 2016-09-15 DIAGNOSIS — M5441 Lumbago with sciatica, right side: Principal | ICD-10-CM

## 2016-09-15 DIAGNOSIS — M5136 Other intervertebral disc degeneration, lumbar region: Secondary | ICD-10-CM | POA: Diagnosis not present

## 2016-09-15 DIAGNOSIS — M5126 Other intervertebral disc displacement, lumbar region: Secondary | ICD-10-CM | POA: Diagnosis not present

## 2016-09-15 DIAGNOSIS — G8929 Other chronic pain: Secondary | ICD-10-CM

## 2016-10-06 ENCOUNTER — Other Ambulatory Visit: Payer: Self-pay | Admitting: Neurology

## 2016-10-06 ENCOUNTER — Other Ambulatory Visit: Payer: Self-pay | Admitting: *Deleted

## 2016-10-06 ENCOUNTER — Other Ambulatory Visit: Payer: Self-pay

## 2016-10-06 DIAGNOSIS — Z79899 Other long term (current) drug therapy: Secondary | ICD-10-CM

## 2016-10-06 DIAGNOSIS — M069 Rheumatoid arthritis, unspecified: Secondary | ICD-10-CM

## 2016-10-06 DIAGNOSIS — M0519 Rheumatoid lung disease with rheumatoid arthritis of multiple sites: Secondary | ICD-10-CM | POA: Diagnosis not present

## 2016-10-07 LAB — CBC WITH DIFFERENTIAL/PLATELET
BASOS ABS: 0 10*3/uL (ref 0.0–0.2)
Basos: 0 %
EOS (ABSOLUTE): 0.1 10*3/uL (ref 0.0–0.4)
Eos: 2 %
Hematocrit: 35.1 % (ref 34.0–46.6)
Hemoglobin: 12 g/dL (ref 11.1–15.9)
Immature Grans (Abs): 0 10*3/uL (ref 0.0–0.1)
Immature Granulocytes: 0 %
LYMPHS ABS: 2 10*3/uL (ref 0.7–3.1)
Lymphs: 27 %
MCH: 28.8 pg (ref 26.6–33.0)
MCHC: 34.2 g/dL (ref 31.5–35.7)
MCV: 84 fL (ref 79–97)
Monocytes Absolute: 0.5 10*3/uL (ref 0.1–0.9)
Monocytes: 6 %
NEUTROS ABS: 4.6 10*3/uL (ref 1.4–7.0)
Neutrophils: 65 %
PLATELETS: 377 10*3/uL (ref 150–379)
RBC: 4.17 x10E6/uL (ref 3.77–5.28)
RDW: 14.4 % (ref 12.3–15.4)
WBC: 7.2 10*3/uL (ref 3.4–10.8)

## 2016-10-07 LAB — COMPREHENSIVE METABOLIC PANEL
ALK PHOS: 58 IU/L (ref 39–117)
ALT: 15 IU/L (ref 0–32)
AST: 16 IU/L (ref 0–40)
Albumin/Globulin Ratio: 2.3 — ABNORMAL HIGH (ref 1.2–2.2)
Albumin: 4.2 g/dL (ref 3.6–4.8)
BUN/Creatinine Ratio: 17 (ref 12–28)
BUN: 8 mg/dL (ref 8–27)
Bilirubin Total: 0.3 mg/dL (ref 0.0–1.2)
CHLORIDE: 98 mmol/L (ref 96–106)
CO2: 20 mmol/L (ref 20–29)
CREATININE: 0.47 mg/dL — AB (ref 0.57–1.00)
Calcium: 8.9 mg/dL (ref 8.7–10.3)
GFR calc Af Amer: 120 mL/min/{1.73_m2} (ref >59)
GFR calc non Af Amer: 104 mL/min/{1.73_m2} (ref >59)
GLUCOSE: 135 mg/dL — AB (ref 65–99)
Globulin, Total: 1.8 g/dL (ref 1.5–4.5)
Potassium: 3.8 mmol/L (ref 3.5–5.2)
SODIUM: 135 mmol/L (ref 134–144)
Total Protein: 6 g/dL (ref 6.0–8.5)

## 2016-10-11 ENCOUNTER — Encounter (INDEPENDENT_AMBULATORY_CARE_PROVIDER_SITE_OTHER): Payer: Self-pay | Admitting: Orthopaedic Surgery

## 2016-10-11 ENCOUNTER — Ambulatory Visit (INDEPENDENT_AMBULATORY_CARE_PROVIDER_SITE_OTHER): Payer: Medicare Other | Admitting: Orthopaedic Surgery

## 2016-10-11 VITALS — BP 107/52 | HR 66 | Ht 63.0 in | Wt 140.0 lb

## 2016-10-11 DIAGNOSIS — M5440 Lumbago with sciatica, unspecified side: Secondary | ICD-10-CM | POA: Diagnosis not present

## 2016-10-11 DIAGNOSIS — G8929 Other chronic pain: Secondary | ICD-10-CM | POA: Diagnosis not present

## 2016-10-11 NOTE — Progress Notes (Signed)
Office Visit Note   [name]           Date of Birth: [birth year]           MRN: [id number] Visit Date: 10/11/2016              Requested by: Kelton Pillar, MD [place] [address] [place], [place] [address] PCP: Kelton Pillar, MD   Assessment & Plan: Visit Diagnoses:  1. Chronic low back pain with sciatica, sciatica laterality unspecified, unspecified back pain laterality   MRI scan performed 09/15/2016 demonstrate chronic L5 pars defects and anterior listhesis with unchanged severe bilateral neural foraminal stenosis. There is progressive L2-3 disc degeneration with a new right extraforaminal disc protrusion and mild right foraminal stenosis  Plan: Consult Dr. Ernestina Patches for back injection  Follow-Up Instructions: Return in about 1 month (around 11/11/2016).   Orders:  Orders Placed This Encounter  Procedures  . Ambulatory referral to Physical Medicine Rehab   No orders of the defined types were placed in this encounter.     Procedures: No procedures performed   Clinical Data: No additional findings.   Subjective: Chief Complaint  Patient presents with  . Lower Back - Results, Pain  History as provided in note from July 20 and chronic low back pain with some referred pain both to the right and to the left. Does have evidence of arthritic changes both right and left hip but a lot of her symptoms seem referable to her lumbar spine. MRI scan results as noted above. No related bowel or bladder dysfunction. Denies shortness of breath or chest pain. Pain in right lower extremity may radiate as far distally as the ankle History of rheumatoid arthritis treated by Dr. Estanislado Pandy with Orencia  HPI  Review of Systems  Constitutional: Negative for chills, fatigue and fever.  Eyes: Negative for itching.  Respiratory: Positive for shortness of breath. Negative for chest tightness.   Cardiovascular: Negative for chest pain, palpitations and leg swelling.    Gastrointestinal: Positive for constipation. Negative for blood in stool and diarrhea.  Musculoskeletal: Positive for back pain, neck pain and neck stiffness. Negative for joint swelling.  Neurological: Negative for dizziness, weakness, numbness and headaches.  Hematological: Does not bruise/bleed easily.  Psychiatric/Behavioral: Negative for sleep disturbance. The patient is not nervous/anxious.      Objective: Vital Signs: BP (!) 107/52   Pulse 66   Ht 5\' 3"  (1.6 m)   Wt 140 lb (63.5 kg)   BMI 24.80 kg/m   Physical Exam  Ortho Exam mild pain with internal/external rotation of both hips but not significant. Straight leg raise negative bilaterally. No significant pain over the greater trochanters of either hip. No buttock pain. Skin intact. No swelling distally. Neurovascular exam intact  Specialty Comments:  No specialty comments available.  Imaging: No results found.   PMFS History: Patient Active Problem List   Diagnosis Date Noted  . High risk medication use 03/15/2016  . DJD (degenerative joint disease), cervical 03/15/2016  . History of asthma 03/15/2016  . History of squamous cell carcinoma 03/15/2016  . History of basal cell carcinoma 03/15/2016  . History of breast cancer 03/15/2016  . Gastroesophageal reflux disease without esophagitis 03/15/2016  . History of Chiari malformation 03/15/2016  . Vitamin D deficiency 03/15/2016  . Rheumatoid arthritis with rheumatoid factor of multiple sites without organ or systems involvement ([place]) 10/20/2015  . Deviated nasal septum 05/24/2014    Class: Chronic  . Insomnia 11/25/2007  .  DYSPNEA 11/15/2007   Past Medical History:  Diagnosis Date  . Asthma    inhaler one x per day  . Cancer [facility])    breast, left  . Chiari malformation   . PONV (postoperative nausea and vomiting)   . Rheumatoid arthritis (HCC)    on Humira    Family History  Problem Relation Age of Onset  . Diabetes Mother   . Heart failure Mother    . Bladder Cancer Mother   . Stroke Mother   . Cancer Father        head and neck  . Uterine cancer Maternal Aunt   . Bladder Cancer Maternal Grandmother   . Colon cancer Maternal Grandmother     Past Surgical History:  Procedure Laterality Date  . BREAST SURGERY Left 2003   Lumpectomy T1C1, NO and negative ER/ PR  . COLONOSCOPY  10/04  . COLONOSCOPY  11/10   normal recheck in 5 years  . COLPORRHAPHY    . ESOPHAGOGASTRODUODENOSCOPY ENDOSCOPY  11/10   GERD  . FOOT SURGERY Left age 42   removal of pseudo rheumatoid nodules from left forefoot.  Marland Kitchen LAPAROSCOPIC BILATERAL SALPINGO OOPHERECTOMY N/A 07/30/2013   Procedure: LAPAROSCOPIC BILATERAL SALPINGO OOPHORECTOMY WITH WASHINGS;  Surgeon: Lyman Speller, MD;  Location: [facility];  Service: Gynecology;  Laterality: N/A;  . NASAL SEPTOPLASTY W/ TURBINOPLASTY Bilateral 05/24/2014   Procedure: NASAL SEPTOPLASTY WITH  BILATERAL TURBINATE REDUCTION;  Surgeon: Jerrell Belfast, MD;  Location: [place];  Service: ENT;  Laterality: Bilateral;  . PELVIC FLOOR REPAIR  3/04   SPARC with AP colporrhaphy  . TOTAL VAGINAL HYSTERECTOMY  1989   with AP repair   Social History   Occupational History  . Not on file.   Social History Main Topics  . Smoking status: Former Smoker    Types: Cigarettes    Quit date: 02/16/1999  . Smokeless tobacco: Never Used  . Alcohol use No  . Drug use: No  . Sexual activity: No

## 2016-10-18 DIAGNOSIS — Z23 Encounter for immunization: Secondary | ICD-10-CM | POA: Diagnosis not present

## 2016-10-21 ENCOUNTER — Ambulatory Visit (INDEPENDENT_AMBULATORY_CARE_PROVIDER_SITE_OTHER): Payer: Self-pay

## 2016-10-21 ENCOUNTER — Encounter (INDEPENDENT_AMBULATORY_CARE_PROVIDER_SITE_OTHER): Payer: Self-pay | Admitting: Physical Medicine and Rehabilitation

## 2016-10-21 ENCOUNTER — Ambulatory Visit (INDEPENDENT_AMBULATORY_CARE_PROVIDER_SITE_OTHER): Payer: Medicare Other | Admitting: Physical Medicine and Rehabilitation

## 2016-10-21 VITALS — BP 113/60 | HR 69

## 2016-10-21 DIAGNOSIS — Q762 Congenital spondylolisthesis: Secondary | ICD-10-CM

## 2016-10-21 DIAGNOSIS — M4316 Spondylolisthesis, lumbar region: Secondary | ICD-10-CM

## 2016-10-21 DIAGNOSIS — M5416 Radiculopathy, lumbar region: Secondary | ICD-10-CM

## 2016-10-21 DIAGNOSIS — M5116 Intervertebral disc disorders with radiculopathy, lumbar region: Secondary | ICD-10-CM

## 2016-10-21 MED ORDER — LIDOCAINE HCL (PF) 1 % IJ SOLN
2.0000 mL | Freq: Once | INTRAMUSCULAR | Status: AC
  Administered 2016-10-21: 2 mL

## 2016-10-21 MED ORDER — DEXAMETHASONE SODIUM PHOSPHATE 10 MG/ML IJ SOLN
15.0000 mg | Freq: Once | INTRAMUSCULAR | Status: AC
  Administered 2016-10-21: 15 mg

## 2016-10-21 NOTE — Patient Instructions (Signed)

## 2016-10-21 NOTE — Progress Notes (Deleted)
Pain across lower back and into right hip and around into thigh. Says its a grabbing pain into hip.Occosional groin pain and occasional pain down to foot although she says this is very rare.

## 2016-10-22 ENCOUNTER — Encounter (INDEPENDENT_AMBULATORY_CARE_PROVIDER_SITE_OTHER): Payer: Self-pay | Admitting: Physical Medicine and Rehabilitation

## 2016-10-22 NOTE — Progress Notes (Signed)
Kristy Perez - 66 y.o. female MRN [id number]  Date of birth: 14-Apr-1950  Office Visit Note: Visit Date: 10/21/2016 PCP: Kelton Pillar, MD Referred by: Kelton Pillar, MD  Subjective: Chief Complaint  Patient presents with  . Lower Back - Pain   HPI: Kristy Perez is very pleasant 66 year old female with history of rheumatoid arthritis and history of known bilateral pars defects at L5-S1. She says over the years she has had some injections in the lumbar spine at [facility] radiology for more low back pain and what she refers to as sciatica. She's been followed by Dr. Durward Fortes for orthopedic problems he's been working with her low low back and hip pain recently. Through him she is felt conservative care including time and medication management. She reports to me today pain across the lower back really a little bit higher than the lumbosacral area and it refers into the right lateral and anterior hip area and then down into the thigh with occasional grabbing pain into the lateral hip and even into the groin at times. She does get some pain even down to the foot posteriorly but this is very rare. She's not really reporting left-sided radicular complaints. No real numbness tingling or paresthesia. Worse with certain positions and twisting and movement and standing. She will get a lot of pain actually with sitting for prolonged period back and even be worse for her. She's had no focal weakness or trauma or bowel or bladder problems. No real painful range of motion of the hips. Dr. Durward Fortes did obtain an MRI lumbar spine which was reviewed today with the patient.    Review of Systems  Constitutional: Negative for chills, fever, malaise/fatigue and weight loss.  HENT: Negative for hearing loss and sinus pain.   Eyes: Negative for blurred vision, double vision and photophobia.  Respiratory: Negative for cough and shortness of breath.   Cardiovascular: Negative for chest pain, palpitations and leg  swelling.  Gastrointestinal: Negative for abdominal pain, nausea and vomiting.  Genitourinary: Negative for flank pain.  Musculoskeletal: Positive for back pain and joint pain. Negative for myalgias.  Skin: Negative for itching and rash.  Neurological: Negative for tremors, focal weakness and weakness.  Endo/Heme/Allergies: Negative.   Psychiatric/Behavioral: Negative for depression.  All other systems reviewed and are negative.  Otherwise per HPI.  Assessment & Plan: Visit Diagnoses:  1. Lumbar radiculopathy   2. Congenital spondylolysis   3. Spondylolisthesis of lumbar region   4. Radiculopathy due to lumbar intervertebral disc disorder     Plan: Findings:  Chronic history of back pain with bilateral pars defects and small listhesis but without really any complaints of significant radicular pain presently down the leg. Does happen on occasion. Her biggest complaint is the right posterior and lateral mid lumbar pain with referral pattern to the thigh. Diagnostically this seems like it would be most related to the lateral extraforaminal disc protrusion at L2-S3. There is new or mild right foraminal stenosis as well here. She does have the listhesis and degenerative changes at L5-S1 with pars defect. I think the best thing we can do is a diagnostic L2 transforaminal epidural steroid injection. If this seems to give her a lot of relief that obviously would have source of pain and could work with that. If she doesn't get much relief we could entertain the idea of injection around the pars defect or possibly diagnostic hip injection although she really has painless range of motion of the hips. Dr. Durward Fortes will continue  with her orthopedic care.    Meds & Orders:  Meds ordered this encounter  Medications  . lidocaine (PF) (XYLOCAINE) 1 % injection 2 mL  . dexamethasone (DECADRON) injection 15 mg    Orders Placed This Encounter  Procedures  . XR C-ARM NO REPORT  . Epidural Steroid  injection    Follow-up: Return if symptoms worsen or fail to improve.   Procedures: No procedures performed  Lumbosacral Transforaminal Epidural Steroid Injection - Sub-Pedicular Approach with Fluoroscopic Guidance  Kristy Perez      Date of Birth: 16-Feb-1951 MRN: [id number] PCP: Kelton Pillar, MD      Visit Date: 10/21/2016   Universal Protocol:    Date/Time: 10/21/2016  Consent Given By: the patient  Position: PRONE  Additional Comments: Vital signs were monitored before and after the procedure. Patient was prepped and draped in the usual sterile fashion. The correct patient, procedure, and site was verified.   Injection Procedure Details:  Procedure Site One Meds Administered:  Meds ordered this encounter  Medications  . lidocaine (PF) (XYLOCAINE) 1 % injection 2 mL  . dexamethasone (DECADRON) injection 15 mg    Laterality: Right  Location/Site:  L2-L3  Needle size: 22 G  Needle type: Spinal  Needle Placement: Transforaminal  Findings:  -Contrast Used: 1 mL iohexol 180 mg iodine/mL   -Comments: Excellent flow of contrast along the nerve and into the epidural space.  Procedure Details: After squaring off the end-plates to get a true AP view, the C-arm was positioned so that an oblique view of the foramen as noted above was visualized. The target area is just inferior to the "nose of the scotty dog" or sub pedicular. The soft tissues overlying this structure were infiltrated with 2-3 ml. of 1% Lidocaine without Epinephrine.  The spinal needle was inserted toward the target using a "trajectory" view along the fluoroscope beam.  Under AP and lateral visualization, the needle was advanced so it did not puncture dura and was located close the 6 O'Clock position of the pedical in AP tracterory. Biplanar projections were used to confirm position. Aspiration was confirmed to be negative for CSF and/or blood. A 1-2 ml. volume of Isovue-250 was injected and  flow of contrast was noted at each level. Radiographs were obtained for documentation purposes.   After attaining the desired flow of contrast documented above, a 0.5 to 1.0 ml test dose of 0.25% Marcaine was injected into each respective transforaminal space.  The patient was observed for 90 seconds post injection.  After no sensory deficits were reported, and normal lower extremity motor function was noted,   the above injectate was administered so that equal amounts of the injectate were placed at each foramen (level) into the transforaminal epidural space.   Additional Comments:  The patient tolerated the procedure well Dressing: Band-Aid    Post-procedure details: Patient was observed during the procedure. Post-procedure instructions were reviewed.  Patient left the clinic in stable condition.       Clinical History: MRI LUMBAR SPINE WITHOUT CONTRAST  TECHNIQUE: Multiplanar, multisequence MR imaging of the lumbar spine was performed. No intravenous contrast was administered.  COMPARISON:  08/22/2007  FINDINGS: Segmentation:  Standard.  Alignment: Chronic bilateral L5 pars defects with 8 mm anterolisthesis of L5 on S1, not significantly changed.  Vertebrae: No fracture, suspicious osseous lesion, or significant marrow edema. Multiple small Schmorl's nodes are again seen scattered throughout the lumbar and lower thoracic spine.  Conus medullaris: Extends to the L1 level  and appears normal.  Paraspinal and other soft tissues: Unremarkable.  Disc levels:  Disc desiccation throughout the lumbar spine. Severe disc space narrowing at L5-S1 with mild narrowing at L2-3.  T10-11:  Negative.  T11-12:  New small left central disc protrusion without stenosis.  T12-L1:  Negative.  L1-2:  At most minimal disc bulging without stenosis, unchanged.  L2-3: Progressive disc bulging asymmetric to the right with a right extraforaminal disc protrusion. New mild  right neural foraminal stenosis. No definite L2 nerve impingement. No spinal stenosis.  L3-4:  Mild disc bulging without stenosis, unchanged.  L4-5: New minimal disc bulging and mild facet arthrosis without stenosis.  L5-S1: Listhesis, mild bulging of uncovered disc, and disc space height loss result in severe bilateral neural foraminal stenosis, not significantly changed. No spinal stenosis.  IMPRESSION: 1. Chronic L5 pars defects and anterolisthesis with unchanged severe bilateral neural foraminal stenosis. 2. Progressive L2-3 disc degeneration with a new right extraforaminal disc protrusion. Mild right foraminal stenosis. 3. New small T11-12 disc protrusion without stenosis.   Electronically Signed   By: Logan Bores M.D.   On: 09/15/2016 18:25  She reports that she quit smoking about 17 years ago. Her smoking use included Cigarettes. She has never used smokeless tobacco.   Recent Labs  03/08/16 0828  HGBA1C 5.2    Objective:  VS:  HT:    WT:   BMI:     BP:113/60  HR:69bpm  TEMP: ( )  RESP:99 % Physical Exam  Constitutional: She is oriented to person, place, and time. She appears well-developed and well-nourished.  Eyes: Pupils are equal, round, and reactive to light. Conjunctivae and EOM are normal.  Cardiovascular: Normal rate and intact distal pulses.   Pulmonary/Chest: Effort normal.  Musculoskeletal:  Patient ambulates without aid. She arises from a seated position really without difficulty. She does have low back pain with extension of the lumbar spine. She does have tight hamstrings bilaterally. She has good distal strength. She is absolutely painless full range of motion of both hips internally and externally.  Neurological: She is alert and oriented to person, place, and time. She exhibits normal muscle tone.  Skin: Skin is warm and dry. No rash noted. No erythema.  Psychiatric: She has a normal mood and affect. Her behavior is normal.  Nursing note  and vitals reviewed.   Ortho Exam Imaging: Xr C-arm No Report  Result Date: 10/21/2016 Please see Notes or Procedures tab for imaging impression.   Past Medical/Family/Surgical/Social History: Medications & Allergies reviewed per EMR Patient Active Problem List   Diagnosis Date Noted  . High risk medication use 03/15/2016  . DJD (degenerative joint disease), cervical 03/15/2016  . History of asthma 03/15/2016  . History of squamous cell carcinoma 03/15/2016  . History of basal cell carcinoma 03/15/2016  . History of breast cancer 03/15/2016  . Gastroesophageal reflux disease without esophagitis 03/15/2016  . History of Chiari malformation 03/15/2016  . Vitamin D deficiency 03/15/2016  . Rheumatoid arthritis with rheumatoid factor of multiple sites without organ or systems involvement ([place]) 10/20/2015  . Deviated nasal septum 05/24/2014    Class: Chronic  . Insomnia 11/25/2007  . DYSPNEA 11/15/2007   Past Medical History:  Diagnosis Date  . Asthma    inhaler one x per day  . Cancer [facility])    breast, left  . Chiari malformation   . PONV (postoperative nausea and vomiting)   . Rheumatoid arthritis ([place])    on Humira   Family History  Problem Relation Age of Onset  . Diabetes Mother   . Heart failure Mother   . Bladder Cancer Mother   . Stroke Mother   . Cancer Father        head and neck  . Uterine cancer Maternal Aunt   . Bladder Cancer Maternal Grandmother   . Colon cancer Maternal Grandmother    Past Surgical History:  Procedure Laterality Date  . BREAST SURGERY Left 2003   Lumpectomy T1C1, NO and negative ER/ PR  . COLONOSCOPY  10/04  . COLONOSCOPY  11/10   normal recheck in 5 years  . COLPORRHAPHY    . ESOPHAGOGASTRODUODENOSCOPY ENDOSCOPY  11/10   GERD  . FOOT SURGERY Left age 2   removal of pseudo rheumatoid nodules from left forefoot.  Marland Kitchen LAPAROSCOPIC BILATERAL SALPINGO OOPHERECTOMY N/A 07/30/2013   Procedure: LAPAROSCOPIC BILATERAL SALPINGO  OOPHORECTOMY WITH WASHINGS;  Surgeon: Lyman Speller, MD;  Location: [facility];  Service: Gynecology;  Laterality: N/A;  . NASAL SEPTOPLASTY W/ TURBINOPLASTY Bilateral 05/24/2014   Procedure: NASAL SEPTOPLASTY WITH  BILATERAL TURBINATE REDUCTION;  Surgeon: Jerrell Belfast, MD;  Location: [place];  Service: ENT;  Laterality: Bilateral;  . PELVIC FLOOR REPAIR  3/04   SPARC with AP colporrhaphy  . TOTAL VAGINAL HYSTERECTOMY  1989   with AP repair   Social History   Occupational History  . Not on file.   Social History Main Topics  . Smoking status: Former Smoker    Types: Cigarettes    Quit date: 02/16/1999  . Smokeless tobacco: Never Used  . Alcohol use No  . Drug use: No  . Sexual activity: No

## 2016-10-22 NOTE — Procedures (Signed)
Lumbosacral Transforaminal Epidural Steroid Injection - Sub-Pedicular Approach with Fluoroscopic Guidance  [name]      Date of Birth: Jun 09, 1950 MRN: [id number] PCP: Kelton Pillar, MD      Visit Date: 10/21/2016   Universal Protocol:    Date/Time: 10/21/2016  Consent Given By: the patient  Position: PRONE  Additional Comments: Vital signs were monitored before and after the procedure. Patient was prepped and draped in the usual sterile fashion. The correct patient, procedure, and site was verified.   Injection Procedure Details:  Procedure Site One Meds Administered:  Meds ordered this encounter  Medications  . lidocaine (PF) (XYLOCAINE) 1 % injection 2 mL  . dexamethasone (DECADRON) injection 15 mg    Laterality: Right  Location/Site:  L2-L3  Needle size: 22 G  Needle type: Spinal  Needle Placement: Transforaminal  Findings:  -Contrast Used: 1 mL iohexol 180 mg iodine/mL   -Comments: Excellent flow of contrast along the nerve and into the epidural space.  Procedure Details: After squaring off the end-plates to get a true AP view, the C-arm was positioned so that an oblique view of the foramen as noted above was visualized. The target area is just inferior to the "nose of the scotty dog" or sub pedicular. The soft tissues overlying this structure were infiltrated with 2-3 ml. of 1% Lidocaine without Epinephrine.  The spinal needle was inserted toward the target using a "trajectory" view along the fluoroscope beam.  Under AP and lateral visualization, the needle was advanced so it did not puncture dura and was located close the 6 O'Clock position of the pedical in AP tracterory. Biplanar projections were used to confirm position. Aspiration was confirmed to be negative for CSF and/or blood. A 1-2 ml. volume of Isovue-250 was injected and flow of contrast was noted at each level. Radiographs were obtained for documentation purposes.   After attaining  the desired flow of contrast documented above, a 0.5 to 1.0 ml test dose of 0.25% Marcaine was injected into each respective transforaminal space.  The patient was observed for 90 seconds post injection.  After no sensory deficits were reported, and normal lower extremity motor function was noted,   the above injectate was administered so that equal amounts of the injectate were placed at each foramen (level) into the transforaminal epidural space.   Additional Comments:  The patient tolerated the procedure well Dressing: Band-Aid    Post-procedure details: Patient was observed during the procedure. Post-procedure instructions were reviewed.  Patient left the clinic in stable condition.

## 2016-11-03 DIAGNOSIS — M0519 Rheumatoid lung disease with rheumatoid arthritis of multiple sites: Secondary | ICD-10-CM | POA: Diagnosis not present

## 2016-11-24 DIAGNOSIS — Z1231 Encounter for screening mammogram for malignant neoplasm of breast: Secondary | ICD-10-CM | POA: Diagnosis not present

## 2016-11-24 DIAGNOSIS — Z853 Personal history of malignant neoplasm of breast: Secondary | ICD-10-CM | POA: Diagnosis not present

## 2016-11-27 NOTE — Progress Notes (Signed)
Office Visit Note  [name]             Date of Birth: [birth year]           MRN: [id number]             PCP: Kelton Pillar, MD Referring: Kelton Pillar, MD Visit Date: 12/08/2016 Occupation: @GUAROCC @    Subjective:  Lower back pain.   History of Present Illness: Kristy Perez is a 66 y.o. female with history of sero positive rheumatoid arthritis and this disease. She states she's been doing quite well on IV Orencia. She denies any joint swelling. She continues to have some discomfort in her lower back. The neck continues to be painful. She has discomfort in her feet due to underlying osteoarthritis. She states she has to take temazepam to sleep at night. She does not get very restful sleep.  Activities of Daily Living:  Patient reports morning stiffness for0 minute.   Patient Denies nocturnal pain.  Difficulty dressing/grooming: Denies Difficulty climbing stairs: Denies Difficulty getting out of chair: Denies Difficulty using hands for taps, buttons, cutlery, and/or writing: Denies   Review of Systems  Constitutional: Positive for fatigue. Negative for night sweats, weight gain, weight loss and weakness.  HENT: Positive for mouth dryness. Negative for mouth sores, trouble swallowing, trouble swallowing and nose dryness.   Eyes: Positive for dryness. Negative for pain, redness and visual disturbance.  Respiratory: Negative for cough, shortness of breath and difficulty breathing.   Cardiovascular: Negative for chest pain, palpitations, hypertension, irregular heartbeat and swelling in legs/feet.  Gastrointestinal: Positive for constipation. Negative for blood in stool and diarrhea.  Endocrine: Negative for increased urination.  Genitourinary: Negative for vaginal dryness.  Musculoskeletal: Positive for arthralgias, joint pain and morning stiffness. Negative for joint swelling, myalgias, muscle weakness, muscle tenderness and myalgias.  Skin: Negative for color  change, rash, hair loss, skin tightness, ulcers and sensitivity to sunlight.  Allergic/Immunologic: Negative for susceptible to infections.  Neurological: Negative for dizziness, memory loss and night sweats.  Hematological: Negative for swollen glands.  Psychiatric/Behavioral: Positive for sleep disturbance. Negative for depressed mood. The patient is not nervous/anxious.     PMFS History:  Patient Active Problem List   Diagnosis Date Noted  . DDD (degenerative disc disease), lumbar 12/08/2016  . High risk medication use 03/15/2016  . DJD (degenerative joint disease), cervical 03/15/2016  . History of asthma 03/15/2016  . History of squamous cell carcinoma 03/15/2016  . History of basal cell carcinoma 03/15/2016  . History of breast cancer 03/15/2016  . Gastroesophageal reflux disease without esophagitis 03/15/2016  . History of Chiari malformation 03/15/2016  . Vitamin D deficiency 03/15/2016  . Rheumatoid arthritis with rheumatoid factor of multiple sites without organ or systems involvement ([place]) 10/20/2015  . Deviated nasal septum 05/24/2014    Class: Chronic  . Insomnia 11/25/2007  . DYSPNEA 11/15/2007    Past Medical History:  Diagnosis Date  . Asthma    inhaler one x per day  . Cancer [facility])    breast, left  . Chiari malformation   . PONV (postoperative nausea and vomiting)   . Rheumatoid arthritis (HCC)    on Humira    Family History  Problem Relation Age of Onset  . Diabetes Mother   . Heart failure Mother   . Bladder Cancer Mother   . Stroke Mother   . Cancer Father        head and neck  . Uterine cancer Maternal  Aunt   . Bladder Cancer Maternal Grandmother   . Colon cancer Maternal Grandmother    Past Surgical History:  Procedure Laterality Date  . BREAST SURGERY Left 2003   Lumpectomy T1C1, NO and negative ER/ PR  . COLONOSCOPY  10/04  . COLONOSCOPY  11/10   normal recheck in 5 years  . COLPORRHAPHY    . ESOPHAGOGASTRODUODENOSCOPY ENDOSCOPY  11/10     GERD  . FOOT SURGERY Left age 69   removal of pseudo rheumatoid nodules from left forefoot.  Marland Kitchen LAPAROSCOPIC BILATERAL SALPINGO OOPHERECTOMY N/A 07/30/2013   Procedure: LAPAROSCOPIC BILATERAL SALPINGO OOPHORECTOMY WITH WASHINGS;  Surgeon: Lyman Speller, MD;  Location: [facility];  Service: Gynecology;  Laterality: N/A;  . NASAL SEPTOPLASTY W/ TURBINOPLASTY Bilateral 05/24/2014   Procedure: NASAL SEPTOPLASTY WITH  BILATERAL TURBINATE REDUCTION;  Surgeon: Jerrell Belfast, MD;  Location: [place];  Service: ENT;  Laterality: Bilateral;  . PELVIC FLOOR REPAIR  3/04   SPARC with AP colporrhaphy  . TOTAL VAGINAL HYSTERECTOMY  1989   with AP repair   Social History   Social History Narrative   Lives alone in a 2 story home. Has 2 children.  Works as a Radiation protection practitioner.  Education: college.      Objective: Vital Signs: BP (!) 104/55 (BP Location: Left Arm, Patient Position: Sitting, Cuff Size: Normal)   Pulse 78   Ht 5\' 1"  (1.549 m)   Wt 129 lb (58.5 kg)   BMI 24.37 kg/m    Physical Exam  Constitutional: She is oriented to person, place, and time. She appears well-developed and well-nourished.  HENT:  Head: Normocephalic and atraumatic.  Eyes: Conjunctivae and EOM are normal.  Neck: Normal range of motion.  Cardiovascular: Normal rate, regular rhythm, normal heart sounds and intact distal pulses.   Pulmonary/Chest: Effort normal and breath sounds normal.  Abdominal: Soft. Bowel sounds are normal.  Lymphadenopathy:    She has no cervical adenopathy.  Neurological: She is alert and oriented to person, place, and time.  Skin: Skin is warm and dry. Capillary refill takes less than 2 seconds.  Psychiatric: She has a normal mood and affect. Her behavior is normal.  Nursing note and vitals reviewed.    Musculoskeletal Exam: C-spine limited range of motion. Thoracic and lumbar spine limited range of motion. Shoulder joints elbow joints wrist joints were good range of motion. She had no synovitis.  She had bilateral ulnar deviation. She also had MCP PIP thickening. Hip joints knee joints ankles were good range of motion. She had discomfort range of motion of her lumbar spine.  CDAI Exam: CDAI Homunculus Exam:   Joint Counts:  CDAI Tender Joint count: 0 CDAI Swollen Joint count: 0  Global Assessments:  Patient Global Assessment: 2 Provider Global Assessment: 2  CDAI Calculated Score: 4    Investigation: No additional findings.TB Gold: negative in 06/2016   CBC Latest Ref Rng & Units 12/01/2016 10/06/2016 08/11/2016  WBC 3.4 - 10.8 x10E3/uL 6.4 7.2 7.6  Hemoglobin 11.1 - 15.9 g/dL 12.3 12.0 11.6  Hematocrit 34.0 - 46.6 % 37.1 35.1 35.9  Platelets 150 - 379 x10E3/uL 374 377 315   CMP Latest Ref Rng & Units 12/01/2016 10/06/2016 08/11/2016  Glucose 65 - 99 mg/dL 102(H) 135(H) 137(H)  BUN 8 - 27 mg/dL 11 8 9   Creatinine 0.57 - 1.00 mg/dL 0.57 0.47(L) 0.54(L)  Sodium 134 - 144 mmol/L 142 135 136  Potassium 3.5 - 5.2 mmol/L 4.1 3.8 4.4  Chloride 96 - 106  mmol/L 103 98 100  CO2 20 - 29 mmol/L 21 20 22   Calcium 8.7 - 10.3 mg/dL 9.2 8.9 8.7  Total Protein 6.0 - 8.5 g/dL 6.5 6.0 6.0  Total Bilirubin 0.0 - 1.2 mg/dL 0.3 0.3 0.3  Alkaline Phos 39 - 117 IU/L 69 58 66  AST 0 - 40 IU/L 19 16 19   ALT 0 - 32 IU/L 13 15 17    Imaging: No results found.  Speciality Comments: Orencia IV 500mg  every 4 weeks, patient infuses at [facility] Tb gold negative 06/15/16    Procedures:  No procedures performed Allergies: Fish allergy; Iodine; Erythromycin; and Sulfonamide derivatives   Assessment / Plan:     Visit Diagnoses: Rheumatoid arthritis with rheumatoid factor of multiple sites without organ or systems involvement ([place]) -  Positive RF, positive CCP, erosive disease. She is doing much better on Orencia IV. She had no synovitis on examination. She's intermittent discomfort.  High risk medication use - Orencia IV every 4 weeks(inadequate response to Enbrel, Humira). Her labs have been stable  we'll continue to monitor her labs with her infusions.  DDD (degenerative disc disease), cervical: She is some limitation with range of motion.  DDD (degenerative disc disease), lumbar: She continues to have lower back discomfort.  Vitamin D deficiency: Her vitamin D was low normal in January. She's been taking supplement. She will have repeat vitamin D level with her PCP. She states she had DEXA done by her GYN and will continue to have that repeated. I do not have results available.  Primary insomnia: Controlled on temazepam.  Her other medical problems are listed as follows:  History of asthma  History of breast cancer - 2003  History of squamous cell carcinoma  History of basal cell carcinoma  History of gastroesophageal reflux (GERD)  History of Chiari malformation    Orders: No orders of the defined types were placed in this encounter.  No orders of the defined types were placed in this encounter.   Face-to-face time spent with patient was 30 minutes. Greater than 50% of time was spent in counseling and coordination of care.  Follow-Up Instructions: Return in about 5 months (around 05/08/2017) for Rheumatoid arthritis.   Bo Merino, MD  Note - This record has been created using Editor, commissioning.  Chart creation errors have been sought, but may not always  have been located. Such creation errors do not reflect on  the standard of medical care.

## 2016-12-01 ENCOUNTER — Other Ambulatory Visit: Payer: Self-pay | Admitting: *Deleted

## 2016-12-01 ENCOUNTER — Other Ambulatory Visit (INDEPENDENT_AMBULATORY_CARE_PROVIDER_SITE_OTHER): Payer: Self-pay

## 2016-12-01 DIAGNOSIS — M0519 Rheumatoid lung disease with rheumatoid arthritis of multiple sites: Secondary | ICD-10-CM | POA: Diagnosis not present

## 2016-12-01 DIAGNOSIS — Z79899 Other long term (current) drug therapy: Secondary | ICD-10-CM | POA: Diagnosis not present

## 2016-12-01 DIAGNOSIS — Z0289 Encounter for other administrative examinations: Secondary | ICD-10-CM

## 2016-12-01 DIAGNOSIS — M0579 Rheumatoid arthritis with rheumatoid factor of multiple sites without organ or systems involvement: Secondary | ICD-10-CM

## 2016-12-01 DIAGNOSIS — M069 Rheumatoid arthritis, unspecified: Secondary | ICD-10-CM | POA: Diagnosis not present

## 2016-12-02 DIAGNOSIS — D0462 Carcinoma in situ of skin of left upper limb, including shoulder: Secondary | ICD-10-CM | POA: Diagnosis not present

## 2016-12-02 DIAGNOSIS — D225 Melanocytic nevi of trunk: Secondary | ICD-10-CM | POA: Diagnosis not present

## 2016-12-02 DIAGNOSIS — D485 Neoplasm of uncertain behavior of skin: Secondary | ICD-10-CM | POA: Diagnosis not present

## 2016-12-02 DIAGNOSIS — Z85828 Personal history of other malignant neoplasm of skin: Secondary | ICD-10-CM | POA: Diagnosis not present

## 2016-12-02 DIAGNOSIS — L570 Actinic keratosis: Secondary | ICD-10-CM | POA: Diagnosis not present

## 2016-12-02 DIAGNOSIS — L820 Inflamed seborrheic keratosis: Secondary | ICD-10-CM | POA: Diagnosis not present

## 2016-12-02 DIAGNOSIS — Z23 Encounter for immunization: Secondary | ICD-10-CM | POA: Diagnosis not present

## 2016-12-02 DIAGNOSIS — L821 Other seborrheic keratosis: Secondary | ICD-10-CM | POA: Diagnosis not present

## 2016-12-02 LAB — COMPREHENSIVE METABOLIC PANEL
A/G RATIO: 2.4 — AB (ref 1.2–2.2)
ALBUMIN: 4.6 g/dL (ref 3.6–4.8)
ALT: 13 IU/L (ref 0–32)
AST: 19 IU/L (ref 0–40)
Alkaline Phosphatase: 69 IU/L (ref 39–117)
BILIRUBIN TOTAL: 0.3 mg/dL (ref 0.0–1.2)
BUN / CREAT RATIO: 19 (ref 12–28)
BUN: 11 mg/dL (ref 8–27)
CHLORIDE: 103 mmol/L (ref 96–106)
CO2: 21 mmol/L (ref 20–29)
Calcium: 9.2 mg/dL (ref 8.7–10.3)
Creatinine, Ser: 0.57 mg/dL (ref 0.57–1.00)
GFR calc Af Amer: 113 mL/min/{1.73_m2} (ref >59)
GFR calc non Af Amer: 98 mL/min/{1.73_m2} (ref >59)
GLOBULIN, TOTAL: 1.9 g/dL (ref 1.5–4.5)
Glucose: 102 mg/dL — ABNORMAL HIGH (ref 65–99)
POTASSIUM: 4.1 mmol/L (ref 3.5–5.2)
Sodium: 142 mmol/L (ref 134–144)
TOTAL PROTEIN: 6.5 g/dL (ref 6.0–8.5)

## 2016-12-02 LAB — CBC WITH DIFFERENTIAL/PLATELET
BASOS: 0 %
Basophils Absolute: 0 10*3/uL (ref 0.0–0.2)
EOS (ABSOLUTE): 0.2 10*3/uL (ref 0.0–0.4)
Eos: 3 %
HEMATOCRIT: 37.1 % (ref 34.0–46.6)
HEMOGLOBIN: 12.3 g/dL (ref 11.1–15.9)
IMMATURE GRANS (ABS): 0 10*3/uL (ref 0.0–0.1)
Immature Granulocytes: 0 %
LYMPHS: 32 %
Lymphocytes Absolute: 2 10*3/uL (ref 0.7–3.1)
MCH: 28.8 pg (ref 26.6–33.0)
MCHC: 33.2 g/dL (ref 31.5–35.7)
MCV: 87 fL (ref 79–97)
MONOCYTES: 8 %
Monocytes Absolute: 0.5 10*3/uL (ref 0.1–0.9)
NEUTROS ABS: 3.6 10*3/uL (ref 1.4–7.0)
Neutrophils: 57 %
Platelets: 374 10*3/uL (ref 150–379)
RBC: 4.27 x10E6/uL (ref 3.77–5.28)
RDW: 13.7 % (ref 12.3–15.4)
WBC: 6.4 10*3/uL (ref 3.4–10.8)

## 2016-12-08 ENCOUNTER — Ambulatory Visit (INDEPENDENT_AMBULATORY_CARE_PROVIDER_SITE_OTHER): Payer: Medicare Other | Admitting: Rheumatology

## 2016-12-08 ENCOUNTER — Encounter: Payer: Self-pay | Admitting: Rheumatology

## 2016-12-08 VITALS — BP 104/55 | HR 78 | Ht 61.0 in | Wt 129.0 lb

## 2016-12-08 DIAGNOSIS — Z85828 Personal history of other malignant neoplasm of skin: Secondary | ICD-10-CM

## 2016-12-08 DIAGNOSIS — M5030 Other cervical disc degeneration, unspecified cervical region: Secondary | ICD-10-CM | POA: Diagnosis not present

## 2016-12-08 DIAGNOSIS — M5136 Other intervertebral disc degeneration, lumbar region: Secondary | ICD-10-CM | POA: Diagnosis not present

## 2016-12-08 DIAGNOSIS — E559 Vitamin D deficiency, unspecified: Secondary | ICD-10-CM

## 2016-12-08 DIAGNOSIS — Z8589 Personal history of malignant neoplasm of other organs and systems: Secondary | ICD-10-CM | POA: Diagnosis not present

## 2016-12-08 DIAGNOSIS — Z79899 Other long term (current) drug therapy: Secondary | ICD-10-CM | POA: Diagnosis not present

## 2016-12-08 DIAGNOSIS — Z8719 Personal history of other diseases of the digestive system: Secondary | ICD-10-CM | POA: Diagnosis not present

## 2016-12-08 DIAGNOSIS — Z853 Personal history of malignant neoplasm of breast: Secondary | ICD-10-CM | POA: Diagnosis not present

## 2016-12-08 DIAGNOSIS — Z8669 Personal history of other diseases of the nervous system and sense organs: Secondary | ICD-10-CM

## 2016-12-08 DIAGNOSIS — F5101 Primary insomnia: Secondary | ICD-10-CM

## 2016-12-08 DIAGNOSIS — M0579 Rheumatoid arthritis with rheumatoid factor of multiple sites without organ or systems involvement: Secondary | ICD-10-CM | POA: Diagnosis not present

## 2016-12-08 DIAGNOSIS — Z8709 Personal history of other diseases of the respiratory system: Secondary | ICD-10-CM

## 2016-12-16 ENCOUNTER — Encounter: Payer: Self-pay | Admitting: Obstetrics & Gynecology

## 2016-12-21 DIAGNOSIS — D0462 Carcinoma in situ of skin of left upper limb, including shoulder: Secondary | ICD-10-CM | POA: Diagnosis not present

## 2016-12-21 DIAGNOSIS — Z23 Encounter for immunization: Secondary | ICD-10-CM | POA: Diagnosis not present

## 2016-12-28 DIAGNOSIS — M8589 Other specified disorders of bone density and structure, multiple sites: Secondary | ICD-10-CM | POA: Diagnosis not present

## 2016-12-28 DIAGNOSIS — M810 Age-related osteoporosis without current pathological fracture: Secondary | ICD-10-CM | POA: Diagnosis not present

## 2016-12-29 DIAGNOSIS — M0519 Rheumatoid lung disease with rheumatoid arthritis of multiple sites: Secondary | ICD-10-CM | POA: Diagnosis not present

## 2017-01-26 ENCOUNTER — Other Ambulatory Visit: Payer: Self-pay | Admitting: Neurology

## 2017-01-26 DIAGNOSIS — Z79899 Other long term (current) drug therapy: Secondary | ICD-10-CM

## 2017-01-26 DIAGNOSIS — M0519 Rheumatoid lung disease with rheumatoid arthritis of multiple sites: Secondary | ICD-10-CM | POA: Diagnosis not present

## 2017-01-26 DIAGNOSIS — M0579 Rheumatoid arthritis with rheumatoid factor of multiple sites without organ or systems involvement: Secondary | ICD-10-CM

## 2017-01-27 LAB — COMPREHENSIVE METABOLIC PANEL
A/G RATIO: 2 (ref 1.2–2.2)
ALK PHOS: 66 IU/L (ref 39–117)
ALT: 13 IU/L (ref 0–32)
AST: 19 IU/L (ref 0–40)
Albumin: 4.3 g/dL (ref 3.6–4.8)
BILIRUBIN TOTAL: 0.2 mg/dL (ref 0.0–1.2)
BUN/Creatinine Ratio: 29 — ABNORMAL HIGH (ref 12–28)
BUN: 12 mg/dL (ref 8–27)
CHLORIDE: 102 mmol/L (ref 96–106)
CO2: 24 mmol/L (ref 20–29)
Calcium: 9.2 mg/dL (ref 8.7–10.3)
Creatinine, Ser: 0.41 mg/dL — ABNORMAL LOW (ref 0.57–1.00)
GFR calc Af Amer: 125 mL/min/{1.73_m2} (ref >59)
GFR calc non Af Amer: 109 mL/min/{1.73_m2} (ref >59)
GLUCOSE: 74 mg/dL (ref 65–99)
Globulin, Total: 2.1 g/dL (ref 1.5–4.5)
POTASSIUM: 4 mmol/L (ref 3.5–5.2)
Sodium: 139 mmol/L (ref 134–144)
Total Protein: 6.4 g/dL (ref 6.0–8.5)

## 2017-01-27 LAB — CBC WITH DIFFERENTIAL/PLATELET
BASOS: 0 %
Basophils Absolute: 0 10*3/uL (ref 0.0–0.2)
EOS (ABSOLUTE): 0.3 10*3/uL (ref 0.0–0.4)
Eos: 3 %
HEMOGLOBIN: 11.9 g/dL (ref 11.1–15.9)
Hematocrit: 36.3 % (ref 34.0–46.6)
IMMATURE GRANS (ABS): 0 10*3/uL (ref 0.0–0.1)
Immature Granulocytes: 0 %
LYMPHS ABS: 2.4 10*3/uL (ref 0.7–3.1)
LYMPHS: 29 %
MCH: 27.8 pg (ref 26.6–33.0)
MCHC: 32.8 g/dL (ref 31.5–35.7)
MCV: 85 fL (ref 79–97)
Monocytes Absolute: 0.7 10*3/uL (ref 0.1–0.9)
Monocytes: 8 %
NEUTROS ABS: 4.9 10*3/uL (ref 1.4–7.0)
Neutrophils: 60 %
Platelets: 403 10*3/uL — ABNORMAL HIGH (ref 150–379)
RBC: 4.28 x10E6/uL (ref 3.77–5.28)
RDW: 13.5 % (ref 12.3–15.4)
WBC: 8.3 10*3/uL (ref 3.4–10.8)

## 2017-02-04 ENCOUNTER — Telehealth: Payer: Self-pay | Admitting: *Deleted

## 2017-02-04 NOTE — Telephone Encounter (Signed)
Patient notified of BMD results. 

## 2017-02-18 ENCOUNTER — Encounter: Payer: Self-pay | Admitting: Obstetrics & Gynecology

## 2017-02-23 DIAGNOSIS — C44629 Squamous cell carcinoma of skin of left upper limb, including shoulder: Secondary | ICD-10-CM | POA: Diagnosis not present

## 2017-02-23 DIAGNOSIS — L570 Actinic keratosis: Secondary | ICD-10-CM | POA: Diagnosis not present

## 2017-03-02 DIAGNOSIS — M0519 Rheumatoid lung disease with rheumatoid arthritis of multiple sites: Secondary | ICD-10-CM | POA: Diagnosis not present

## 2017-03-03 ENCOUNTER — Telehealth (INDEPENDENT_AMBULATORY_CARE_PROVIDER_SITE_OTHER): Payer: Self-pay | Admitting: Physical Medicine and Rehabilitation

## 2017-03-03 NOTE — Telephone Encounter (Signed)
yes

## 2017-03-03 NOTE — Telephone Encounter (Signed)
Needs auth for [id number]. 

## 2017-03-08 NOTE — Telephone Encounter (Signed)
Scheduled for 03/14/16 at 0900 with driver and no blood thinners.

## 2017-03-14 ENCOUNTER — Ambulatory Visit (INDEPENDENT_AMBULATORY_CARE_PROVIDER_SITE_OTHER): Payer: Medicare Other

## 2017-03-14 ENCOUNTER — Encounter (INDEPENDENT_AMBULATORY_CARE_PROVIDER_SITE_OTHER): Payer: Self-pay | Admitting: Physical Medicine and Rehabilitation

## 2017-03-14 ENCOUNTER — Ambulatory Visit (INDEPENDENT_AMBULATORY_CARE_PROVIDER_SITE_OTHER): Payer: Medicare Other | Admitting: Physical Medicine and Rehabilitation

## 2017-03-14 VITALS — BP 116/62 | HR 92 | Temp 97.9°F

## 2017-03-14 DIAGNOSIS — M5116 Intervertebral disc disorders with radiculopathy, lumbar region: Secondary | ICD-10-CM

## 2017-03-14 DIAGNOSIS — Q762 Congenital spondylolisthesis: Secondary | ICD-10-CM | POA: Diagnosis not present

## 2017-03-14 DIAGNOSIS — M5416 Radiculopathy, lumbar region: Secondary | ICD-10-CM

## 2017-03-14 DIAGNOSIS — M4317 Spondylolisthesis, lumbosacral region: Secondary | ICD-10-CM

## 2017-03-14 MED ORDER — DEXAMETHASONE SODIUM PHOSPHATE 10 MG/ML IJ SOLN
15.0000 mg | Freq: Once | INTRAMUSCULAR | Status: AC
  Administered 2017-03-14: 15 mg

## 2017-03-14 NOTE — Patient Instructions (Signed)

## 2017-03-14 NOTE — Procedures (Signed)
Kristy Perez is a 67 year old female with chronic history of axial low back pain predominantly at the lumbosacral junction but with more recent worsening of right hip and thigh pain laterally and up into the groin area that started last year.  Dr. Durward Fortes has followed her did not feel like this was hip related although she does have some osteoarthritic changes of the hips.  She is followed by Dr. Estanislado Pandy for rheumatoid arthritis.  She does have bilateral pars defects.  There is a right foraminal protrusion at L2 and we completed an injection at L2 with good relief back in September.  We will repeat this injection today.  Lumbosacral Transforaminal Epidural Steroid Injection - Sub-Pedicular Approach with Fluoroscopic Guidance  [name]      Date of Birth: 1950/11/05 MRN: [id number] PCP: Kelton Pillar, MD      Visit Date: 03/14/2017   Universal Protocol:    Date/Time: 03/14/2017  Consent Given By: the patient  Position: PRONE  Additional Comments: Vital signs were monitored before and after the procedure. Patient was prepped and draped in the usual sterile fashion. The correct patient, procedure, and site was verified.   Injection Procedure Details:  Procedure Site One Meds Administered:  Meds ordered this encounter  Medications  . dexamethasone (DECADRON) injection 15 mg    Laterality: Right  Location/Site:  L2-L3  Needle size: 56 G  Needle type: Spinal  Needle Placement: Transforaminal  Findings:    -Comments: Excellent flow of contrast along the nerve and into the epidural space.  Procedure Details: After squaring off the end-plates to get a true AP view, the C-arm was positioned so that an oblique view of the foramen as noted above was visualized. The target area is just inferior to the "nose of the scotty dog" or sub pedicular. The soft tissues overlying this structure were infiltrated with 2-3 ml. of 1% Lidocaine without Epinephrine.  The spinal  needle was inserted toward the target using a "trajectory" view along the fluoroscope beam.  Under AP and lateral visualization, the needle was advanced so it did not puncture dura and was located close the 6 O'Clock position of the pedical in AP tracterory. Biplanar projections were used to confirm position. Aspiration was confirmed to be negative for CSF and/or blood. A 1-2 ml. volume of Isovue-250 was injected and flow of contrast was noted at each level. Radiographs were obtained for documentation purposes.   After attaining the desired flow of contrast documented above, a 0.5 to 1.0 ml test dose of 0.25% Marcaine was injected into each respective transforaminal space.  The patient was observed for 90 seconds post injection.  After no sensory deficits were reported, and normal lower extremity motor function was noted,   the above injectate was administered so that equal amounts of the injectate were placed at each foramen (level) into the transforaminal epidural space.   Additional Comments:  The patient tolerated the procedure well Dressing: Band-Aid    Post-procedure details: Patient was observed during the procedure. Post-procedure instructions were reviewed.  Patient left the clinic in stable condition.   Pertinent Imaging: MRI LUMBAR SPINE WITHOUT CONTRAST  TECHNIQUE: Multiplanar, multisequence MR imaging of the lumbar spine was performed. No intravenous contrast was administered.  COMPARISON:  08/22/2007  FINDINGS: Segmentation:  Standard.  Alignment: Chronic bilateral L5 pars defects with 8 mm anterolisthesis of L5 on S1, not significantly changed.  Vertebrae: No fracture, suspicious osseous lesion, or significant marrow edema. Multiple small Schmorl's nodes are again seen  scattered throughout the lumbar and lower thoracic spine.  Conus medullaris: Extends to the L1 level and appears normal.  Paraspinal and other soft tissues: Unremarkable.  Disc  levels:  Disc desiccation throughout the lumbar spine. Severe disc space narrowing at L5-S1 with mild narrowing at L2-3.  T10-11:  Negative.  T11-12:  New small left central disc protrusion without stenosis.  T12-L1:  Negative.  L1-2:  At most minimal disc bulging without stenosis, unchanged.  L2-3: Progressive disc bulging asymmetric to the right with a right extraforaminal disc protrusion. New mild right neural foraminal stenosis. No definite L2 nerve impingement. No spinal stenosis.  L3-4:  Mild disc bulging without stenosis, unchanged.  L4-5: New minimal disc bulging and mild facet arthrosis without stenosis.  L5-S1: Listhesis, mild bulging of uncovered disc, and disc space height loss result in severe bilateral neural foraminal stenosis, not significantly changed. No spinal stenosis.  IMPRESSION: 1. Chronic L5 pars defects and anterolisthesis with unchanged severe bilateral neural foraminal stenosis. 2. Progressive L2-3 disc degeneration with a new right extraforaminal disc protrusion. Mild right foraminal stenosis. 3. New small T11-12 disc protrusion without stenosis.   Electronically Signed   By: Logan Bores M.D.   On: 09/15/2016 18:25

## 2017-03-14 NOTE — Progress Notes (Deleted)
Pt states a constant pain in lower back and right hip. Pt states pain has been going on for a few years and has just gotten worse over the years. Pt states standing and sitting for long period of times makes the pain worse, pt states laying down eases pain. Pt states last injection 10/21/16 helped and lasted for 8 weeks.  +Driver, -BT, -Dye Allergies.

## 2017-03-15 ENCOUNTER — Ambulatory Visit: Payer: BLUE CROSS/BLUE SHIELD | Admitting: Neurology

## 2017-03-24 ENCOUNTER — Ambulatory Visit (INDEPENDENT_AMBULATORY_CARE_PROVIDER_SITE_OTHER): Payer: Medicare Other | Admitting: Neurology

## 2017-03-24 ENCOUNTER — Encounter: Payer: Self-pay | Admitting: Neurology

## 2017-03-24 ENCOUNTER — Telehealth: Payer: Self-pay | Admitting: *Deleted

## 2017-03-24 ENCOUNTER — Other Ambulatory Visit: Payer: Self-pay

## 2017-03-24 ENCOUNTER — Telehealth: Payer: Self-pay | Admitting: Rheumatology

## 2017-03-24 VITALS — BP 105/64 | HR 66 | Resp 14 | Ht 63.0 in | Wt 123.5 lb

## 2017-03-24 DIAGNOSIS — M069 Rheumatoid arthritis, unspecified: Secondary | ICD-10-CM

## 2017-03-24 DIAGNOSIS — Z8669 Personal history of other diseases of the nervous system and sense organs: Secondary | ICD-10-CM

## 2017-03-24 DIAGNOSIS — Z79899 Other long term (current) drug therapy: Secondary | ICD-10-CM

## 2017-03-24 DIAGNOSIS — M0579 Rheumatoid arthritis with rheumatoid factor of multiple sites without organ or systems involvement: Secondary | ICD-10-CM | POA: Diagnosis not present

## 2017-03-24 NOTE — Telephone Encounter (Signed)
RAS aware and will order labs to be drawn when pt. comes in for her infusion next week/fim

## 2017-03-24 NOTE — Telephone Encounter (Signed)
Marissa from Dr. Arma Heading office called back to say the labs can go ahead and be ordered, nothing more or less is needed.  Marissa also states no need for a call back

## 2017-03-24 NOTE — Progress Notes (Signed)
[facility]  [name] DOB: 21-Jun-1950  REFERRING DOCTOR OR PCP:  Dr. Estanislado Pandy SOURCE: patient, notes from Dr. Estanislado Pandy, labs  _________________________________   HISTORICAL  CHIEF COMPLAINT:  Chief Complaint  Patient presents with  . Rheumatoid Arthritis    Here for clearance for Orencia infusions.  Next infusion scheduled for next wk/fim    HISTORY OF PRESENT ILLNESS:  I had the pleasure seeing you patient, Kristy Perez, at [facility] for neurologic consultation regarding Orencia infusions for her rheumatoid arthritis.  Update 03/24/2017: She was diagnosed with rheumatoid factor positive rheumatoid arthritis in 2002. She notes pain in her hands and feet.  Pain seems worse towards the end of each monthly Orencia cycle.   Pain is also in the left shoulder.     She has been on Orencia IV x 4-5 years.   She tolerates it well.     Lab work has done well.    She was on Humira, Enbrel and MTX in the past.     Humira had worked well but she had gingival disease so stopped.   She gets lab work every other cycle.     She has a small Chiari malformation noted on imaging studies. It extends about 5 mm.   She gets brain or cervical spine studies periodically to follow this as she is at risk due to her rheumatoid arthritis and potential pannus development. Adjacent brain appears normal.  From 06/22/2016: She had had problems with joint pain, especially in her feet times many years. Around 2002, she was diagnosed with rheumatoid arthritis after a rheumatoid factor returned positive. He was to start methotrexate and Plaquenil but was diagnosed with breast cancer around that time so those medicines were discontinued while she was undergoing treatment with breast cancer. She had a complete response to her treatment (surgery, chemotherapy and radiation) remains cancer free.   After her chemotherapy, she went back on plaque on until plus methotrexate  injections. About 12 years ago, she switched to Humira and was on that for about 8 years. She had many dental issues including abscesses and it was discontinued. Enbrel was tried but it was less effective for her RA. About 4 years ago she started Orencia infusions. She has tolerated them well and has not had any problems with infections. Typically, the day of the infusion she will fill tired or achy. The next day she may also feel tired. The rest of the month she does well..    She is on Orencia 500 mg IV every 4 weeks.    She had her last infusion 06/15/2016.     She has allergies to Iodine injection (xray/CT) and fish.  She does ok with Betadine.    Besides breast cancer, she has had had several Basal cell and squamous cell cancers.   She had Mohs surgery on her face.      She also has had a Chiari Malformation and was seeing Dr. Carloyn Manner of Neurosurgery.   Because she also has risk of pannus development from her rheumatoid arthritis, and the MRI on an annual or near annual basis. The last MRI of her brain was performed January 2018. I personally reviewed the images. It shows a borderline Chiari malformation (looked mild on prior MRI). The adjacent brain and spinal cord have normal signal.  REVIEW OF SYSTEMS: Constitutional: No fevers, chills, sweats, or change in appetite.    She gets fatigue the last week of each Orencia cycle.  Eyes: No visual changes, double vision, eye pain Ear, nose and throat: No hearing loss, ear pain, nasal congestion, sore throat Cardiovascular: No chest pain, palpitations Respiratory: No shortness of breath at rest or with exertion.   No wheezes GastrointestinaI: No nausea, vomiting, diarrhea, abdominal pain, fecal incontinence.   She has GERD Genitourinary: No dysuria, urinary retention.   She notes urinary frequency and nocturia. Musculoskeletal: as above Integumentary: No rash, pruritus, skin lesions Neurological: as above.   She notes decreased focus, improved with  Adderall Psychiatric: No depression at this time.  No anxiety Endocrine: No palpitations, diaphoresis, change in appetite, change in weigh or increased thirst Hematologic/Lymphatic: No anemia, purpura, petechiae. Allergic/Immunologic: No itchy/runny eyes, nasal congestion, recent allergic reactions, rashes  ALLERGIES: Allergies  Allergen Reactions  . Fish Allergy Anaphylaxis  . Iodine Anaphylaxis    " PER PT IV CONTRAST"  . Erythromycin     UNSPECIFIED REACTION   . Sulfonamide Derivatives Other (See Comments)    Tongue turns black.    HOME MEDICATIONS:  Current Outpatient Medications:  .  Abatacept (ORENCIA IV), Inject 500 mg into the vein. Infuse 2 vials / 500 mg over 30 minutes every 4 weeks, Disp: , Rfl:  .  acetaminophen (TYLENOL) 325 MG tablet, Take 650 mg by mouth once. One hour prior to infusion, Disp: , Rfl:  .  amphetamine-dextroamphetamine (ADDERALL) 10 MG tablet, Take 10 mg by mouth daily as needed., Disp: , Rfl: 0 .  conjugated estrogens (PREMARIN) vaginal cream, Use 1/2 gram vaginally twice per week at bedtime, Disp: 30 g, Rfl: 2 .  diphenhydrAMINE (BENADRYL) 25 mg capsule, Take 25 mg by mouth once. One hour prior to infusion, Disp: , Rfl:  .  famotidine (PEPCID) 10 MG tablet, Take 10 mg by mouth daily., Disp: , Rfl:  .  fluorouracil (EFUDEX) 5 % cream, Apply 1 application topically daily., Disp: , Rfl:  .  fluticasone (FLONASE) 50 MCG/ACT nasal spray, Place 1 spray into both nostrils daily., Disp: , Rfl:  .  ibuprofen (ADVIL,MOTRIN) 200 MG tablet, Take 400 mg by mouth every 6 (six) hours as needed (for inflammation)., Disp: , Rfl:  .  lidocaine (LIDODERM) 5 %, Place 1 patch onto the skin daily as needed (for shoulder pain). , Disp: , Rfl:  .  Magnesium 250 MG TABS, Take 4 tablets by mouth daily., Disp: , Rfl:  .  mometasone-formoterol (DULERA) 100-5 MCG/ACT AERO, Inhale 1 puff into the lungs daily. , Disp: , Rfl:  .  Omega-3 Fatty Acids (FISH OIL) 1000 MG CAPS, Take  1,000 mg by mouth daily., Disp: , Rfl:  .  predniSONE (DELTASONE) 10 MG tablet, as directed. , Disp: , Rfl:  .  promethazine (PHENERGAN) 25 MG tablet, Take 25 mg by mouth every 6 (six) hours as needed for nausea or vomiting., Disp: , Rfl:  .  RESTASIS 0.05 % ophthalmic emulsion, Place 1 drop into both eyes 2 (two) times daily., Disp: , Rfl:  .  SF 1.1 % GEL dental gel, , Disp: , Rfl: 0 .  Sodium Fluoride 1.1 % PSTE, Apply small ribbon on tooth brush. Brush for 2 minutes. Expectorate and do not rinse for 3minutes.Do this at least 2x daily., Disp: , Rfl:  .  temazepam (RESTORIL) 30 MG capsule, Take 1 capsule by mouth at bedtime., Disp: , Rfl:  .  VENTOLIN HFA 108 (90 BASE) MCG/ACT inhaler, Inhale 2 puffs into the lungs every 4 (four) hours as needed., Disp: , Rfl:  .  Vitamin D, Ergocalciferol, (DRISDOL) 50000 units CAPS capsule, Take 1 capsule (50,000 Units total) by mouth every 7 (seven) days., Disp: 12 capsule, Rfl: 4  PAST MEDICAL HISTORY: Past Medical History:  Diagnosis Date  . Asthma    inhaler one x per day  . Cancer [facility])    breast, left  . Chiari malformation   . PONV (postoperative nausea and vomiting)   . Rheumatoid arthritis ([place])    on Humira    PAST SURGICAL HISTORY: Past Surgical History:  Procedure Laterality Date  . BREAST SURGERY Left 2003   Lumpectomy T1C1, NO and negative ER/ PR  . COLONOSCOPY  10/04  . COLONOSCOPY  11/10   normal recheck in 5 years  . COLPORRHAPHY    . ESOPHAGOGASTRODUODENOSCOPY ENDOSCOPY  11/10   GERD  . FOOT SURGERY Left age 83   removal of pseudo rheumatoid nodules from left forefoot.  Marland Kitchen LAPAROSCOPIC BILATERAL SALPINGO OOPHERECTOMY N/A 07/30/2013   Procedure: LAPAROSCOPIC BILATERAL SALPINGO OOPHORECTOMY WITH WASHINGS;  Surgeon: Lyman Speller, MD;  Location: [facility];  Service: Gynecology;  Laterality: N/A;  . NASAL SEPTOPLASTY W/ TURBINOPLASTY Bilateral 05/24/2014   Procedure: NASAL SEPTOPLASTY WITH  BILATERAL TURBINATE REDUCTION;   Surgeon: Jerrell Belfast, MD;  Location: [facility];  Service: ENT;  Laterality: Bilateral;  . PELVIC FLOOR REPAIR  3/04   SPARC with AP colporrhaphy  . TOTAL VAGINAL HYSTERECTOMY  1989   with AP repair    FAMILY HISTORY: Family History  Problem Relation Age of Onset  . Diabetes Mother   . Heart failure Mother   . Bladder Cancer Mother   . Stroke Mother   . Cancer Father        head and neck  . Uterine cancer Maternal Aunt   . Bladder Cancer Maternal Grandmother   . Colon cancer Maternal Grandmother     SOCIAL HISTORY:  Social History   Socioeconomic History  . Marital status: Divorced    Spouse name: Not on file  . Number of children: 2  . Years of education: Not on file  . Highest education level: Not on file  Social Needs  . Financial resource strain: Not on file  . Food insecurity - worry: Not on file  . Food insecurity - inability: Not on file  . Transportation needs - medical: Not on file  . Transportation needs - non-medical: Not on file  Occupational History  . Not on file  Tobacco Use  . Smoking status: Former Smoker    Types: Cigarettes    Last attempt to quit: 02/16/1999    Years since quitting: 18.1  . Smokeless tobacco: Never Used  Substance and Sexual Activity  . Alcohol use: No  . Drug use: No  . Sexual activity: No    Partners: Male  Other Topics Concern  . Not on file  Social History Narrative   Lives alone in a 2 story home. Has 2 children.  Works as a Radiation protection practitioner.  Education: college.      PHYSICAL EXAM  Vitals:   03/24/17 1430  BP: 105/64  Pulse: 66  Resp: 14  Weight: 123 lb 8 oz (56 kg)  Height: 5\' 3"  (1.6 m)    Body mass index is 21.88 kg/m.   General: The patient is well-developed and well-nourished and in no acute distress   Neck:  The neck is nontender.   Skin: Extremities are without rash or edema.  Musculoskeletal:  Back is nontender.   Joints in her feet and  hands are tender.   Ulnar deviation of the fingers  bilaterally.  Neurologic Exam  Mental status: The patient is alert and oriented x 3 at the time of the examination. The patient has apparent normal recent and remote memory, with an apparently normal attention span and concentration ability.   Speech is normal.  Cranial nerves: Extraocular movements are full. Facial strength and sensation is normal.  Trapezius and sternocleidomastoid strength is normal. No dysarthria is noted.  The tongue is midline, and the patient has symmetric elevation of the soft palate. No obvious hearing deficits are noted.  Motor:  Muscle bulk is normal.   Tone is normal. Strength is  5 / 5 in all 4 extremities.    Coordination: Cerebellar testing reveals good finger-nose-finger bilaterally.  Gait and station: Station is normal.   Gait is arthritic. Tandem gait is mildly wide. Romberg is negative.   Reflexes: Deep tendon reflexes are symmetric and normal bilaterally.        DIAGNOSTIC DATA (LABS, IMAGING, TESTING) - I reviewed patient records, labs, notes, testing and imaging myself where available.  Lab Results  Component Value Date   WBC 8.3 01/26/2017   HGB 11.9 01/26/2017   HCT 36.3 01/26/2017   MCV 85 01/26/2017   PLT 403 (H) 01/26/2017      Component Value Date/Time   NA 139 01/26/2017 1720   K 4.0 01/26/2017 1720   CL 102 01/26/2017 1720   CO2 24 01/26/2017 1720   GLUCOSE 74 01/26/2017 1720   GLUCOSE 91 06/15/2016 0951   BUN 12 01/26/2017 1720   CREATININE 0.41 (L) 01/26/2017 1720   CALCIUM 9.2 01/26/2017 1720   PROT 6.4 01/26/2017 1720   ALBUMIN 4.3 01/26/2017 1720   AST 19 01/26/2017 1720   ALT 13 01/26/2017 1720   ALKPHOS 66 01/26/2017 1720   BILITOT 0.2 01/26/2017 1720   GFRNONAA 109 01/26/2017 1720   GFRAA 125 01/26/2017 1720   Lab Results  Component Value Date   CHOL 238 (H) 03/08/2016   HDL 72 03/08/2016   LDLCALC 145 (H) 03/08/2016   TRIG 104 03/08/2016   CHOLHDL 3.3 03/08/2016   Lab Results  Component Value Date    HGBA1C 5.2 03/08/2016   No results found for: VITAMINB12 Lab Results  Component Value Date   TSH 1.20 03/08/2016       ASSESSMENT AND PLAN  Rheumatoid arthritis involving multiple sites, unspecified rheumatoid factor presence ([place]) - Plan: CBC with Differential/Platelet, Comprehensive metabolic panel  Rheumatoid arthritis with rheumatoid factor of multiple sites without organ or systems involvement ([place])  High risk medication use  History of Chiari malformation    1.    She will continue on Orencia 500 mg every 4 weeks and get lab testing every 8 weeks.   2.    The Chiari malformation appears to be stable. 3.     Will return in one year.   She will call sooner if she has any new or worsening neurologic symptoms.      Lynesha Bango A. Felecia Shelling, MD, PhD 02/24/1476, 2:95 PM Certified in Neurology, Clinical Neurophysiology, Sleep Medicine, Pain Medicine and Neuroimaging  [facility] [address] [place], [place] [address] [phone]

## 2017-03-24 NOTE — Telephone Encounter (Signed)
Faith from Dr. Garth Bigness office called to see if you would like their office to order the labs when Mariachristina comes in for her appointment. Next week.  Please call Faith at [phone]

## 2017-03-24 NOTE — Telephone Encounter (Signed)
Returned [employer] phone call and they will go ahead and  draw a CBC and CMP when the patient comes in for her appointment next week at their office.

## 2017-03-24 NOTE — Telephone Encounter (Signed)
Spoke with Dr. Arma Heading office.  She sends Shyra to our infusion center for her Orencia infusions for RA. Dr. Felecia Shelling saw Maurina today--just needs to know if Dr. Estanislado Pandy would like to order pt's routine RA labs, or if she would like [facility] to order them. Hilton Cork

## 2017-03-29 DIAGNOSIS — M069 Rheumatoid arthritis, unspecified: Secondary | ICD-10-CM | POA: Diagnosis not present

## 2017-03-29 DIAGNOSIS — M0519 Rheumatoid lung disease with rheumatoid arthritis of multiple sites: Secondary | ICD-10-CM | POA: Diagnosis not present

## 2017-03-29 NOTE — Addendum Note (Signed)
Addended by: Inis Sizer D on: 03/29/2017 10:04 AM   Modules accepted: Orders

## 2017-03-30 ENCOUNTER — Telehealth: Payer: Self-pay | Admitting: Neurology

## 2017-03-30 LAB — COMPREHENSIVE METABOLIC PANEL
ALT: 19 IU/L (ref 0–32)
AST: 16 IU/L (ref 0–40)
Albumin/Globulin Ratio: 2 (ref 1.2–2.2)
Albumin: 4.5 g/dL (ref 3.6–4.8)
Alkaline Phosphatase: 76 IU/L (ref 39–117)
BILIRUBIN TOTAL: 0.4 mg/dL (ref 0.0–1.2)
BUN/Creatinine Ratio: 17 (ref 12–28)
BUN: 9 mg/dL (ref 8–27)
CALCIUM: 9.1 mg/dL (ref 8.7–10.3)
CHLORIDE: 103 mmol/L (ref 96–106)
CO2: 21 mmol/L (ref 20–29)
CREATININE: 0.52 mg/dL — AB (ref 0.57–1.00)
GFR calc non Af Amer: 101 mL/min/{1.73_m2} (ref >59)
GFR, EST AFRICAN AMERICAN: 116 mL/min/{1.73_m2} (ref >59)
GLUCOSE: 62 mg/dL — AB (ref 65–99)
Globulin, Total: 2.3 g/dL (ref 1.5–4.5)
Potassium: 4 mmol/L (ref 3.5–5.2)
Sodium: 141 mmol/L (ref 134–144)
TOTAL PROTEIN: 6.8 g/dL (ref 6.0–8.5)

## 2017-03-30 LAB — CBC WITH DIFFERENTIAL/PLATELET
BASOS ABS: 0 10*3/uL (ref 0.0–0.2)
Basos: 0 %
EOS (ABSOLUTE): 0.3 10*3/uL (ref 0.0–0.4)
EOS: 4 %
HEMOGLOBIN: 12.5 g/dL (ref 11.1–15.9)
Hematocrit: 37.9 % (ref 34.0–46.6)
IMMATURE GRANS (ABS): 0 10*3/uL (ref 0.0–0.1)
Immature Granulocytes: 0 %
LYMPHS: 23 %
Lymphocytes Absolute: 1.9 10*3/uL (ref 0.7–3.1)
MCH: 28.3 pg (ref 26.6–33.0)
MCHC: 33 g/dL (ref 31.5–35.7)
MCV: 86 fL (ref 79–97)
MONOCYTES: 8 %
Monocytes Absolute: 0.7 10*3/uL (ref 0.1–0.9)
NEUTROS ABS: 5.5 10*3/uL (ref 1.4–7.0)
Neutrophils: 65 %
Platelets: 375 10*3/uL (ref 150–379)
RBC: 4.42 x10E6/uL (ref 3.77–5.28)
RDW: 13.7 % (ref 12.3–15.4)
WBC: 8.4 10*3/uL (ref 3.4–10.8)

## 2017-03-30 NOTE — Telephone Encounter (Signed)
Called to make the pt aware that lab work was WNL

## 2017-03-30 NOTE — Telephone Encounter (Signed)
-----   Message from Britt Bottom, MD sent at 03/30/2017  8:32 AM EST ----- Please let the patient know that the lab work is fine.

## 2017-04-26 DIAGNOSIS — M0519 Rheumatoid lung disease with rheumatoid arthritis of multiple sites: Secondary | ICD-10-CM | POA: Diagnosis not present

## 2017-05-05 NOTE — Progress Notes (Signed)
Office Visit Note  [name]             Date of Birth: Jul 01, 1950           MRN: [id number]             PCP: Kelton Pillar, MD Referring: Kelton Pillar, MD Visit Date: 05/19/2017 Occupation: @GUAROCC @    Subjective:  Hand stiffness   History of Present Illness: Kristy Perez is a 67 y.o. female seropositive rheumatoid arthritis and DDD.  Patient states that she is due for her next Orencia injection in 1 week.  She states that about 1 week before her next infusion she develops increased joint stiffness and some discomfort in her joints.  She denies any recent flares of her rheumatoid arthritis.  She denies any joint swelling at this time.  She states that today she is having stiffness in her bilateral hands and some discomfort in her fifth metatarsals bilaterally.  She states that she is seeing Dr. Ernestina Patches next week for management of sciatica.  She states that she gets injections by him every 3-4 months.  She states that she sleeps with a pillow between her knees at night which helps with her back pain.  She states that she has occasional neck pain and stiffness.  She states that she has been having increased headaches due to chiari malformation and muscle tension in her neck.    Activities of Daily Living:  Patient reports morning stiffness for 0  minutes.   Patient Denies nocturnal pain.  Difficulty dressing/grooming: Denies Difficulty climbing stairs: Reports Difficulty getting out of chair: Reports Difficulty using hands for taps, buttons, cutlery, and/or writing: Reports   Review of Systems  Constitutional: Positive for fatigue.  HENT: Positive for mouth dryness. Negative for mouth sores and nose dryness.   Eyes: Positive for dryness. Negative for pain and visual disturbance.  Respiratory: Negative for cough (Hx of asthma), hemoptysis, shortness of breath and difficulty breathing.   Cardiovascular: Negative for chest pain, palpitations, hypertension and swelling  in legs/feet.  Gastrointestinal: Negative for blood in stool, constipation and diarrhea.  Endocrine: Negative for increased urination.  Genitourinary: Negative for painful urination.  Musculoskeletal: Positive for arthralgias, joint pain and morning stiffness. Negative for joint swelling, myalgias, muscle weakness, muscle tenderness and myalgias.  Skin: Negative for color change, pallor, rash, hair loss, nodules/bumps, skin tightness, ulcers and sensitivity to sunlight.  Allergic/Immunologic: Negative for susceptible to infections.  Neurological: Positive for dizziness (Hx of vertigo) and headaches. Negative for numbness and weakness.  Hematological: Negative for swollen glands.  Psychiatric/Behavioral: Negative for depressed mood and sleep disturbance. The patient is not nervous/anxious.     PMFS History:  Patient Active Problem List   Diagnosis Date Noted  . DDD (degenerative disc disease), lumbar 12/08/2016  . High risk medication use 03/15/2016  . DJD (degenerative joint disease), cervical 03/15/2016  . History of asthma 03/15/2016  . History of squamous cell carcinoma 03/15/2016  . History of basal cell carcinoma 03/15/2016  . History of breast cancer 03/15/2016  . Gastroesophageal reflux disease without esophagitis 03/15/2016  . History of Chiari malformation 03/15/2016  . Vitamin D deficiency 03/15/2016  . Rheumatoid arthritis with rheumatoid factor of multiple sites without organ or systems involvement ([place]) 10/20/2015  . Deviated nasal septum 05/24/2014    Class: Chronic  . Insomnia 11/25/2007  . DYSPNEA 11/15/2007    Past Medical History:  Diagnosis Date  . Asthma    inhaler one x per  day  . Cancer ([place])    breast, left  . Chiari malformation   . PONV (postoperative nausea and vomiting)   . Rheumatoid arthritis ([facility])    on Humira    Family History  Problem Relation Age of Onset  . Diabetes Mother   . Heart failure Mother   . Bladder Cancer Mother   . Stroke  Mother   . Cancer Father        head and neck  . Uterine cancer Maternal Aunt   . Bladder Cancer Maternal Grandmother   . Colon cancer Maternal Grandmother    Past Surgical History:  Procedure Laterality Date  . BREAST SURGERY Left 2003   Lumpectomy T1C1, NO and negative ER/ PR  . COLONOSCOPY  10/04  . COLONOSCOPY  11/10   normal recheck in 5 years  . COLPORRHAPHY    . ESOPHAGOGASTRODUODENOSCOPY ENDOSCOPY  11/10   GERD  . FOOT SURGERY Left age 32   removal of pseudo rheumatoid nodules from left forefoot.  Marland Kitchen LAPAROSCOPIC BILATERAL SALPINGO OOPHERECTOMY N/A 07/30/2013   Procedure: LAPAROSCOPIC BILATERAL SALPINGO OOPHORECTOMY WITH WASHINGS;  Surgeon: Lyman Speller, MD;  Location: [facility];  Service: Gynecology;  Laterality: N/A;  . NASAL SEPTOPLASTY W/ TURBINOPLASTY Bilateral 05/24/2014   Procedure: NASAL SEPTOPLASTY WITH  BILATERAL TURBINATE REDUCTION;  Surgeon: Jerrell Belfast, MD;  Location: [facility];  Service: ENT;  Laterality: Bilateral;  . PELVIC FLOOR REPAIR  3/04   SPARC with AP colporrhaphy  . TOTAL VAGINAL HYSTERECTOMY  1989   with AP repair   Social History   Social History Narrative   Lives alone in a 2 story home. Has 2 children.  Works as a Radiation protection practitioner.  Education: college.      Objective: Vital Signs: BP 102/60 (BP Location: Left Arm, Patient Position: Sitting, Cuff Size: Normal)   Pulse 79   Resp 16   Ht 5\' 2"  (1.575 m)   Wt 126 lb (57.2 kg)   BMI 23.05 kg/m    Physical Exam  Constitutional: She is oriented to person, place, and time. She appears well-developed and well-nourished.  HENT:  Head: Normocephalic and atraumatic.  Eyes: Conjunctivae and EOM are normal.  Neck: Normal range of motion.  Cardiovascular: Normal rate, regular rhythm, normal heart sounds and intact distal pulses.  Pulmonary/Chest: Effort normal and breath sounds normal.  Abdominal: Soft. Bowel sounds are normal.  Lymphadenopathy:    She has no cervical adenopathy.  Neurological:  She is alert and oriented to person, place, and time.  Skin: Skin is warm and dry. Capillary refill takes less than 2 seconds.  Psychiatric: She has a normal mood and affect. Her behavior is normal.  Nursing note and vitals reviewed.    Musculoskeletal Exam: C-spine limited range of motion with some discomfort.  Thoracic and lumbar spine good range of motion.  No midline spinal tenderness.  No SI joint tenderness.  Shoulder joints, elbow joints, wrist joints, MCPs, PIPs, DIPs good range of motion with no synovitis.  She has ulnar deviation bilaterally.  She has MCP and PIP synovial thickening.  Hip joints, knee joints, ankle joints, MTPs, PIPs, DIPs good range of motion with no synovitis.  No warmth or effusion of bilateral knees.  No tenderness of trochanteric bursa.  She has PIP and DIP synovial thickening consistent with osteoarthritis.  CDAI Exam: CDAI Homunculus Exam:   Joint Counts:  CDAI Tender Joint count: 0 CDAI Swollen Joint count: 0  Global Assessments:  Patient Global Assessment: 1 Provider  Global Assessment: 1  CDAI Calculated Score: 2    Investigation: No additional findings. CBC Latest Ref Rng & Units 03/29/2017 01/26/2017 12/01/2016  WBC 3.4 - 10.8 x10E3/uL 8.4 8.3 6.4  Hemoglobin 11.1 - 15.9 g/dL 12.5 11.9 12.3  Hematocrit 34.0 - 46.6 % 37.9 36.3 37.1  Platelets 150 - 379 x10E3/uL 375 403(H) 374   CMP Latest Ref Rng & Units 03/29/2017 01/26/2017 12/01/2016  Glucose 65 - 99 mg/dL 62(L) 74 102(H)  BUN 8 - 27 mg/dL 9 12 11   Creatinine 0.57 - 1.00 mg/dL 0.52(L) 0.41(L) 0.57  Sodium 134 - 144 mmol/L 141 139 142  Potassium 3.5 - 5.2 mmol/L 4.0 4.0 4.1  Chloride 96 - 106 mmol/L 103 102 103  CO2 20 - 29 mmol/L 21 24 21   Calcium 8.7 - 10.3 mg/dL 9.1 9.2 9.2  Total Protein 6.0 - 8.5 g/dL 6.8 6.4 6.5  Total Bilirubin 0.0 - 1.2 mg/dL 0.4 0.2 0.3  Alkaline Phos 39 - 117 IU/L 76 66 69  AST 0 - 40 IU/L 16 19 19   ALT 0 - 32 IU/L 19 13 13     Imaging: No results  found.  Speciality Comments: Orencia IV 500mg  every 4 weeks, patient infuses at [facility] Tb gold negative 06/15/16    Procedures:  No procedures performed Allergies: Fish allergy; Iodine; Erythromycin; and Sulfonamide derivatives   Assessment / Plan:     Visit Diagnoses: Rheumatoid arthritis with rheumatoid factor of multiple sites without organ or systems involvement ([place]) - +RF, +CCP: She has no synovitis on exam.  She has MCP and PIP synovial thickening and ulnar deviation bilaterally.  She is due for her Orencia infusion next week.  She has been having increased stiffness and some discomfort in her joints a week before she is due for her next infusion.  She has not had any recent flares of her rheumatoid arthritis.  She would like to continue on Orencia infusions at this time.  High risk medication use - Orencia IV every 4 weeks (inadequate response to Enbrel and Humira) CBC and CMP: 2/12/19TB gold: 06/15/16.  She gets labs with her infusions.  DDD (degenerative disc disease), cervical: She has some limited range of motion with discomfort in her C-spine.  DDD (degenerative disc disease), lumbar: Good range of motion.  No midline spinal tenderness.  Vitamin D deficiency: She is on vitamin D supplement.  Other medical conditions are listed as follows:  Primary insomnia  History of squamous cell carcinoma  History of breast cancer  History of asthma  History of Chiari malformation  Gastroesophageal reflux disease without esophagitis    Orders: No orders of the defined types were placed in this encounter.  No orders of the defined types were placed in this encounter.   Face-to-face time spent with patient was 30 minutes. >50% of time was spent in counseling and coordination of care.  Follow-Up Instructions: Return in about 5 months (around 10/19/2017) for Rheumatoid arthritis.   Ofilia Neas, PA-C  I examined and evaluated the patient with Hazel Sams PA. The plan of care was  discussed as noted above.  Bo Merino, MD  Note - This record has been created using Editor, commissioning.  Chart creation errors have been sought, but may not always  have been located. Such creation errors do not reflect on  the standard of medical care.

## 2017-05-10 ENCOUNTER — Telehealth (INDEPENDENT_AMBULATORY_CARE_PROVIDER_SITE_OTHER): Payer: Self-pay | Admitting: Physical Medicine and Rehabilitation

## 2017-05-10 NOTE — Telephone Encounter (Signed)
ok 

## 2017-05-11 ENCOUNTER — Ambulatory Visit: Payer: Medicare Other | Admitting: Rheumatology

## 2017-05-11 NOTE — Telephone Encounter (Signed)
Bilateral L5-S1 TF vs facet, looks like insurance would be no pre-auth but double check, she has pars defects

## 2017-05-11 NOTE — Telephone Encounter (Signed)
I called the patient, and she states that she is having pain in the same area, but then she stated that she wants an injection lower because most of her pain is in her lower back. Please advise.

## 2017-05-11 NOTE — Telephone Encounter (Signed)
Scheduled for 05/23/17 at 1530.

## 2017-05-19 ENCOUNTER — Ambulatory Visit (INDEPENDENT_AMBULATORY_CARE_PROVIDER_SITE_OTHER): Payer: Medicare Other | Admitting: Physician Assistant

## 2017-05-19 ENCOUNTER — Encounter: Payer: Self-pay | Admitting: Physician Assistant

## 2017-05-19 VITALS — BP 102/60 | HR 79 | Resp 16 | Ht 62.0 in | Wt 126.0 lb

## 2017-05-19 DIAGNOSIS — M5136 Other intervertebral disc degeneration, lumbar region: Secondary | ICD-10-CM

## 2017-05-19 DIAGNOSIS — E559 Vitamin D deficiency, unspecified: Secondary | ICD-10-CM

## 2017-05-19 DIAGNOSIS — Z8589 Personal history of malignant neoplasm of other organs and systems: Secondary | ICD-10-CM

## 2017-05-19 DIAGNOSIS — K219 Gastro-esophageal reflux disease without esophagitis: Secondary | ICD-10-CM | POA: Diagnosis not present

## 2017-05-19 DIAGNOSIS — Z853 Personal history of malignant neoplasm of breast: Secondary | ICD-10-CM

## 2017-05-19 DIAGNOSIS — Z8709 Personal history of other diseases of the respiratory system: Secondary | ICD-10-CM

## 2017-05-19 DIAGNOSIS — Z79899 Other long term (current) drug therapy: Secondary | ICD-10-CM

## 2017-05-19 DIAGNOSIS — M5030 Other cervical disc degeneration, unspecified cervical region: Secondary | ICD-10-CM

## 2017-05-19 DIAGNOSIS — Z8669 Personal history of other diseases of the nervous system and sense organs: Secondary | ICD-10-CM | POA: Diagnosis not present

## 2017-05-19 DIAGNOSIS — F5101 Primary insomnia: Secondary | ICD-10-CM

## 2017-05-19 DIAGNOSIS — M0579 Rheumatoid arthritis with rheumatoid factor of multiple sites without organ or systems involvement: Secondary | ICD-10-CM | POA: Diagnosis not present

## 2017-05-19 DIAGNOSIS — M51369 Other intervertebral disc degeneration, lumbar region without mention of lumbar back pain or lower extremity pain: Secondary | ICD-10-CM

## 2017-05-23 ENCOUNTER — Ambulatory Visit (INDEPENDENT_AMBULATORY_CARE_PROVIDER_SITE_OTHER): Payer: Medicare Other | Admitting: Physical Medicine and Rehabilitation

## 2017-05-23 ENCOUNTER — Ambulatory Visit (INDEPENDENT_AMBULATORY_CARE_PROVIDER_SITE_OTHER): Payer: Self-pay

## 2017-05-23 ENCOUNTER — Encounter (INDEPENDENT_AMBULATORY_CARE_PROVIDER_SITE_OTHER): Payer: Self-pay | Admitting: Physical Medicine and Rehabilitation

## 2017-05-23 VITALS — BP 113/67 | HR 78 | Temp 98.1°F

## 2017-05-23 DIAGNOSIS — Q762 Congenital spondylolisthesis: Secondary | ICD-10-CM

## 2017-05-23 DIAGNOSIS — M5416 Radiculopathy, lumbar region: Secondary | ICD-10-CM | POA: Diagnosis not present

## 2017-05-23 DIAGNOSIS — M4316 Spondylolisthesis, lumbar region: Secondary | ICD-10-CM

## 2017-05-23 MED ORDER — BETAMETHASONE SOD PHOS & ACET 6 (3-3) MG/ML IJ SUSP
12.0000 mg | Freq: Once | INTRAMUSCULAR | Status: AC
  Administered 2017-05-23: 12 mg

## 2017-05-23 NOTE — Progress Notes (Signed)
 .  Numeric Pain Rating Scale and Functional Assessment Average Pain 4   In the last MONTH (on 0-10 scale) has pain interfered with the following?  1. General activity like being  able to carry out your everyday physical activities such as walking, climbing stairs, carrying groceries, or moving a chair?  Rating(3)   +Driver, -BT, -Dye Allergies(allergic to iodine).

## 2017-05-23 NOTE — Patient Instructions (Signed)

## 2017-05-24 DIAGNOSIS — M069 Rheumatoid arthritis, unspecified: Secondary | ICD-10-CM | POA: Diagnosis not present

## 2017-05-24 DIAGNOSIS — M0519 Rheumatoid lung disease with rheumatoid arthritis of multiple sites: Secondary | ICD-10-CM | POA: Diagnosis not present

## 2017-05-24 NOTE — Addendum Note (Signed)
Addended by: Inis Sizer D on: 05/24/2017 10:03 AM   Modules accepted: Orders

## 2017-05-25 ENCOUNTER — Telehealth: Payer: Self-pay | Admitting: *Deleted

## 2017-05-25 LAB — COMPREHENSIVE METABOLIC PANEL
ALK PHOS: 66 IU/L (ref 39–117)
ALT: 16 IU/L (ref 0–32)
AST: 19 IU/L (ref 0–40)
Albumin/Globulin Ratio: 2.6 — ABNORMAL HIGH (ref 1.2–2.2)
Albumin: 4.7 g/dL (ref 3.6–4.8)
BUN/Creatinine Ratio: 25 (ref 12–28)
BUN: 13 mg/dL (ref 8–27)
Bilirubin Total: 0.3 mg/dL (ref 0.0–1.2)
CALCIUM: 9.3 mg/dL (ref 8.7–10.3)
CO2: 19 mmol/L — ABNORMAL LOW (ref 20–29)
Chloride: 103 mmol/L (ref 96–106)
Creatinine, Ser: 0.53 mg/dL — ABNORMAL LOW (ref 0.57–1.00)
GFR calc Af Amer: 115 mL/min/{1.73_m2} (ref >59)
GFR calc non Af Amer: 100 mL/min/{1.73_m2} (ref >59)
GLUCOSE: 128 mg/dL — AB (ref 65–99)
Globulin, Total: 1.8 g/dL (ref 1.5–4.5)
Potassium: 4.3 mmol/L (ref 3.5–5.2)
Sodium: 139 mmol/L (ref 134–144)
Total Protein: 6.5 g/dL (ref 6.0–8.5)

## 2017-05-25 LAB — CBC WITH DIFFERENTIAL/PLATELET
BASOS: 0 %
Basophils Absolute: 0 10*3/uL (ref 0.0–0.2)
EOS (ABSOLUTE): 0 10*3/uL (ref 0.0–0.4)
EOS: 0 %
HEMATOCRIT: 35.4 % (ref 34.0–46.6)
Hemoglobin: 11.9 g/dL (ref 11.1–15.9)
IMMATURE GRANS (ABS): 0.1 10*3/uL (ref 0.0–0.1)
IMMATURE GRANULOCYTES: 1 %
Lymphocytes Absolute: 0.8 10*3/uL (ref 0.7–3.1)
Lymphs: 9 %
MCH: 27.8 pg (ref 26.6–33.0)
MCHC: 33.6 g/dL (ref 31.5–35.7)
MCV: 83 fL (ref 79–97)
Monocytes Absolute: 0.3 10*3/uL (ref 0.1–0.9)
Monocytes: 3 %
NEUTROS ABS: 8.5 10*3/uL — AB (ref 1.4–7.0)
NEUTROS PCT: 87 %
Platelets: 370 10*3/uL (ref 150–379)
RBC: 4.28 x10E6/uL (ref 3.77–5.28)
RDW: 14.3 % (ref 12.3–15.4)
WBC: 9.6 10*3/uL (ref 3.4–10.8)

## 2017-05-25 NOTE — Telephone Encounter (Signed)
LMOM with below lab results.  She does not need to return this call unless she has questions/fim 

## 2017-05-25 NOTE — Telephone Encounter (Signed)
-----   Message from Britt Bottom, MD sent at 05/25/2017 10:42 AM EDT ----- Please let the patient know that the lab work is fine.

## 2017-05-27 NOTE — Procedures (Signed)
Lumbosacral Transforaminal Epidural Steroid Injection - Sub-Pedicular Approach with Fluoroscopic Guidance  [name]      Date of Birth: March 17, 1950 MRN: [id number] PCP: Kelton Pillar, MD      Visit Date: 05/23/2017   Universal Protocol:    Date/Time: 05/23/2017  Consent Given By: the patient  Position: PRONE  Additional Comments: Vital signs were monitored before and after the procedure. Patient was prepped and draped in the usual sterile fashion. The correct patient, procedure, and site was verified.   Injection Procedure Details:  Procedure Site One Meds Administered:  Meds ordered this encounter  Medications  . betamethasone acetate-betamethasone sodium phosphate (CELESTONE) injection 12 mg    Laterality: Bilateral  Location/Site:  L5-S1  Needle size: 22 G  Needle type: Spinal  Needle Placement: Transforaminal  Findings:    -Comments: Excellent flow of contrast along the nerve and into the epidural space.  Procedure Details: After squaring off the end-plates to get a true AP view, the C-arm was positioned so that an oblique view of the foramen as noted above was visualized. The target area is just inferior to the "nose of the scotty dog" or sub pedicular. The soft tissues overlying this structure were infiltrated with 2-3 ml. of 1% Lidocaine without Epinephrine.  The spinal needle was inserted toward the target using a "trajectory" view along the fluoroscope beam.  Under AP and lateral visualization, the needle was advanced so it did not puncture dura and was located close the 6 O'Clock position of the pedical in AP tracterory. Biplanar projections were used to confirm position. Aspiration was confirmed to be negative for CSF and/or blood. A 1-2 ml. volume of Isovue-250 was injected and flow of contrast was noted at each level. Radiographs were obtained for documentation purposes.   After attaining the desired flow of contrast documented above, a 0.5  to 1.0 ml test dose of 0.25% Marcaine was injected into each respective transforaminal space.  The patient was observed for 90 seconds post injection.  After no sensory deficits were reported, and normal lower extremity motor function was noted,   the above injectate was administered so that equal amounts of the injectate were placed at each foramen (level) into the transforaminal epidural space.   Additional Comments:  The patient tolerated the procedure well Dressing: Band-Aid    Post-procedure details: Patient was observed during the procedure. Post-procedure instructions were reviewed.  Patient left the clinic in stable condition.

## 2017-05-27 NOTE — Progress Notes (Signed)
Kristy Perez - 67 y.o. female MRN [id number]  Date of birth: 01/19/1951  Office Visit Note: Visit Date: 05/23/2017 PCP: Kelton Pillar, MD Referred by: Kelton Pillar, MD  Subjective: Chief Complaint  Patient presents with  . Lower Back - Pain   HPI: Kristy Perez is a 67 year old female that we have seen in the past for L2 transforaminal injection which did help her hip and thigh pain quite significantly.  She follows with Dr. Durward Fortes for orthopedic care Dr. Estanislado Pandy for rheumatology and rheumatoid arthritis.  She comes in today with a different complaint of bilateral low back pain some referral into the bilateral lateral hips.  She reports worsening with prolonged sitting more than standing.  She does have pars defect bilaterally and some foraminal narrowing.  She has had listhesis.  I think the best approach is bilateral L5 transforaminal epidural steroid injection diagnostically and hopefully therapeutically.  If it does not help I would look at facet joint blocks.   ROS Otherwise per HPI.  Assessment & Plan: Visit Diagnoses:  1. Lumbar radiculopathy   2. Congenital spondylolysis   3. Spondylolisthesis of lumbar region     Plan: No additional findings.   Meds & Orders:  Meds ordered this encounter  Medications  . betamethasone acetate-betamethasone sodium phosphate (CELESTONE) injection 12 mg    Orders Placed This Encounter  Procedures  . XR C-ARM NO REPORT  . Epidural Steroid injection    Follow-up: Return if symptoms worsen or fail to improve.   Procedures: No procedures performed  Lumbosacral Transforaminal Epidural Steroid Injection - Sub-Pedicular Approach with Fluoroscopic Guidance  [name]      Date of Birth: 1950/04/21 MRN: [id number] PCP: Kelton Pillar, MD      Visit Date: 05/23/2017   Universal Protocol:    Date/Time: 05/23/2017  Consent Given By: the patient  Position: PRONE  Additional Comments: Vital signs were monitored  before and after the procedure. Patient was prepped and draped in the usual sterile fashion. The correct patient, procedure, and site was verified.   Injection Procedure Details:  Procedure Site One Meds Administered:  Meds ordered this encounter  Medications  . betamethasone acetate-betamethasone sodium phosphate (CELESTONE) injection 12 mg    Laterality: Bilateral  Location/Site:  L5-S1  Needle size: 22 G  Needle type: Spinal  Needle Placement: Transforaminal  Findings:    -Comments: Excellent flow of contrast along the nerve and into the epidural space.  Procedure Details: After squaring off the end-plates to get a true AP view, the C-arm was positioned so that an oblique view of the foramen as noted above was visualized. The target area is just inferior to the "nose of the scotty dog" or sub pedicular. The soft tissues overlying this structure were infiltrated with 2-3 ml. of 1% Lidocaine without Epinephrine.  The spinal needle was inserted toward the target using a "trajectory" view along the fluoroscope beam.  Under AP and lateral visualization, the needle was advanced so it did not puncture dura and was located close the 6 O'Clock position of the pedical in AP tracterory. Biplanar projections were used to confirm position. Aspiration was confirmed to be negative for CSF and/or blood. A 1-2 ml. volume of Isovue-250 was injected and flow of contrast was noted at each level. Radiographs were obtained for documentation purposes.   After attaining the desired flow of contrast documented above, a 0.5 to 1.0 ml test dose of 0.25% Marcaine was injected into each respective transforaminal space.  The patient was observed for 90 seconds post injection.  After no sensory deficits were reported, and normal lower extremity motor function was noted,   the above injectate was administered so that equal amounts of the injectate were placed at each foramen (level) into the transforaminal  epidural space.   Additional Comments:  The patient tolerated the procedure well Dressing: Band-Aid    Post-procedure details: Patient was observed during the procedure. Post-procedure instructions were reviewed.  Patient left the clinic in stable condition.    Clinical History: MRI LUMBAR SPINE WITHOUT CONTRAST  TECHNIQUE: Multiplanar, multisequence MR imaging of the lumbar spine was performed. No intravenous contrast was administered.  COMPARISON:  08/22/2007  FINDINGS: Segmentation:  Standard.  Alignment: Chronic bilateral L5 pars defects with 8 mm anterolisthesis of L5 on S1, not significantly changed.  Vertebrae: No fracture, suspicious osseous lesion, or significant marrow edema. Multiple small Schmorl's nodes are again seen scattered throughout the lumbar and lower thoracic spine.  Conus medullaris: Extends to the L1 level and appears normal.  Paraspinal and other soft tissues: Unremarkable.  Disc levels:  Disc desiccation throughout the lumbar spine. Severe disc space narrowing at L5-S1 with mild narrowing at L2-3.  T10-11:  Negative.  T11-12:  New small left central disc protrusion without stenosis.  T12-L1:  Negative.  L1-2:  At most minimal disc bulging without stenosis, unchanged.  L2-3: Progressive disc bulging asymmetric to the right with a right extraforaminal disc protrusion. New mild right neural foraminal stenosis. No definite L2 nerve impingement. No spinal stenosis.  L3-4:  Mild disc bulging without stenosis, unchanged.  L4-5: New minimal disc bulging and mild facet arthrosis without stenosis.  L5-S1: Listhesis, mild bulging of uncovered disc, and disc space height loss result in severe bilateral neural foraminal stenosis, not significantly changed. No spinal stenosis.  IMPRESSION: 1. Chronic L5 pars defects and anterolisthesis with unchanged severe bilateral neural foraminal stenosis. 2. Progressive L2-3 disc  degeneration with a new right extraforaminal disc protrusion. Mild right foraminal stenosis. 3. New small T11-12 disc protrusion without stenosis.   Electronically Signed   By: Logan Bores M.D.   On: 09/15/2016 18:25   She reports that she quit smoking about 18 years ago. Her smoking use included cigarettes. She has never used smokeless tobacco. No results for input(s): HGBA1C, LABURIC in the last 8760 hours.  Objective:  VS:  HT:    WT:   BMI:     BP:113/67  HR:78bpm  TEMP:98.1 F (36.7 C)(Oral)  RESP:97 % Physical Exam  Ortho Exam Imaging: No results found.  Past Medical/Family/Surgical/Social History: Medications & Allergies reviewed per EMR, new medications updated. Patient Active Problem List   Diagnosis Date Noted  . DDD (degenerative disc disease), lumbar 12/08/2016  . High risk medication use 03/15/2016  . DJD (degenerative joint disease), cervical 03/15/2016  . History of asthma 03/15/2016  . History of squamous cell carcinoma 03/15/2016  . History of basal cell carcinoma 03/15/2016  . History of breast cancer 03/15/2016  . Gastroesophageal reflux disease without esophagitis 03/15/2016  . History of Chiari malformation 03/15/2016  . Vitamin D deficiency 03/15/2016  . Rheumatoid arthritis with rheumatoid factor of multiple sites without organ or systems involvement ([place]) 10/20/2015  . Deviated nasal septum 05/24/2014    Class: Chronic  . Insomnia 11/25/2007  . DYSPNEA 11/15/2007   Past Medical History:  Diagnosis Date  . Asthma    inhaler one x per day  . Cancer [facility])    breast, left  .  Chiari malformation   . PONV (postoperative nausea and vomiting)   . Rheumatoid arthritis ([facility])    on Humira   Family History  Problem Relation Age of Onset  . Diabetes Mother   . Heart failure Mother   . Bladder Cancer Mother   . Stroke Mother   . Cancer Father        head and neck  . Uterine cancer Maternal Aunt   . Bladder Cancer Maternal Grandmother   .  Colon cancer Maternal Grandmother    Past Surgical History:  Procedure Laterality Date  . BREAST SURGERY Left 2003   Lumpectomy T1C1, NO and negative ER/ PR  . COLONOSCOPY  10/04  . COLONOSCOPY  11/10   normal recheck in 5 years  . COLPORRHAPHY    . ESOPHAGOGASTRODUODENOSCOPY ENDOSCOPY  11/10   GERD  . FOOT SURGERY Left age 80   removal of pseudo rheumatoid nodules from left forefoot.  Marland Kitchen LAPAROSCOPIC BILATERAL SALPINGO OOPHERECTOMY N/A 07/30/2013   Procedure: LAPAROSCOPIC BILATERAL SALPINGO OOPHORECTOMY WITH WASHINGS;  Surgeon: Lyman Speller, MD;  Location: [facility];  Service: Gynecology;  Laterality: N/A;  . NASAL SEPTOPLASTY W/ TURBINOPLASTY Bilateral 05/24/2014   Procedure: NASAL SEPTOPLASTY WITH  BILATERAL TURBINATE REDUCTION;  Surgeon: Jerrell Belfast, MD;  Location: [facility];  Service: ENT;  Laterality: Bilateral;  . PELVIC FLOOR REPAIR  3/04   SPARC with AP colporrhaphy  . TOTAL VAGINAL HYSTERECTOMY  1989   with AP repair   Social History   Occupational History  . Not on file  Tobacco Use  . Smoking status: Former Smoker    Types: Cigarettes    Last attempt to quit: 02/16/1999    Years since quitting: 18.2  . Smokeless tobacco: Never Used  Substance and Sexual Activity  . Alcohol use: No  . Drug use: No  . Sexual activity: Never    Partners: Male

## 2017-06-22 DIAGNOSIS — M0519 Rheumatoid lung disease with rheumatoid arthritis of multiple sites: Secondary | ICD-10-CM | POA: Diagnosis not present

## 2017-07-04 ENCOUNTER — Encounter: Payer: Self-pay | Admitting: Obstetrics & Gynecology

## 2017-07-04 ENCOUNTER — Ambulatory Visit: Payer: Medicare Other | Admitting: Obstetrics & Gynecology

## 2017-07-04 ENCOUNTER — Other Ambulatory Visit: Payer: Self-pay

## 2017-07-04 VITALS — BP 122/66 | HR 84 | Resp 16 | Ht 61.5 in | Wt 127.2 lb

## 2017-07-04 DIAGNOSIS — E559 Vitamin D deficiency, unspecified: Secondary | ICD-10-CM | POA: Diagnosis not present

## 2017-07-04 DIAGNOSIS — E7800 Pure hypercholesterolemia, unspecified: Secondary | ICD-10-CM | POA: Diagnosis not present

## 2017-07-04 DIAGNOSIS — Z01419 Encounter for gynecological examination (general) (routine) without abnormal findings: Secondary | ICD-10-CM

## 2017-07-04 DIAGNOSIS — Z124 Encounter for screening for malignant neoplasm of cervix: Secondary | ICD-10-CM

## 2017-07-04 MED ORDER — ESTROGENS, CONJUGATED 0.625 MG/GM VA CREA: TOPICAL_CREAM | VAGINAL | 3 refills | Status: DC

## 2017-07-04 MED ORDER — VITAMIN D (ERGOCALCIFEROL) 1.25 MG (50000 UNIT) PO CAPS: 50000.0000 [IU] | ORAL_CAPSULE | ORAL | 4 refills | Status: DC

## 2017-07-04 NOTE — Progress Notes (Signed)
67 y.o. G3T5176 DivorcedCaucasianF here for annual exam.  Doing well except having some low back pain and some right hip pain.  Having injections in back with Dr. Ernestina Patches.    Denies vaginal bleeding.    Patient's last menstrual period was 02/15/1985 (approximate).          Sexually active: No.  The current method of family planning is status post hysterectomy.    Exercising: No.   Smoker:  no  Health Maintenance: Pap:  01/28/15 Neg  History of abnormal Pap:  yes MMG:  11/24/16 BIRADS2:Benign  Colonoscopy:  01/02/14 f/u 5 years  BMD:   12/28/16 Osteoporosis  TDaP:  2010 Pneumonia vaccine(s):  2009 Shingrix:   12/2016 Hep C testing: 03/08/16 Neg  Screening Labs: PCP   reports that she quit smoking about 18 years ago. Her smoking use included cigarettes. She has never used smokeless tobacco. She reports that she does not drink alcohol or use drugs.  Past Medical History:  Diagnosis Date  . Asthma    inhaler one x per day  . Cancer [facility])    breast, left  . Chiari malformation   . PONV (postoperative nausea and vomiting)   . Rheumatoid arthritis ([place])    on Humira    Past Surgical History:  Procedure Laterality Date  . BREAST SURGERY Left 2003   Lumpectomy T1C1, NO and negative ER/ PR  . COLONOSCOPY  10/04  . COLONOSCOPY  11/10   normal recheck in 5 years  . COLPORRHAPHY    . ESOPHAGOGASTRODUODENOSCOPY ENDOSCOPY  11/10   GERD  . FOOT SURGERY Left age 38   removal of pseudo rheumatoid nodules from left forefoot.  Marland Kitchen LAPAROSCOPIC BILATERAL SALPINGO OOPHERECTOMY N/A 07/30/2013   Procedure: LAPAROSCOPIC BILATERAL SALPINGO OOPHORECTOMY WITH WASHINGS;  Surgeon: Lyman Speller, MD;  Location: [facility];  Service: Gynecology;  Laterality: N/A;  . NASAL SEPTOPLASTY W/ TURBINOPLASTY Bilateral 05/24/2014   Procedure: NASAL SEPTOPLASTY WITH  BILATERAL TURBINATE REDUCTION;  Surgeon: Jerrell Belfast, MD;  Location: [facility];  Service: ENT;  Laterality: Bilateral;  . PELVIC FLOOR REPAIR   3/04   SPARC with AP colporrhaphy  . TOTAL VAGINAL HYSTERECTOMY  1989   with AP repair    Current Outpatient Medications  Medication Sig Dispense Refill  . Abatacept (ORENCIA IV) Inject 500 mg into the vein. Infuse 2 vials / 500 mg over 30 minutes every 4 weeks    . acetaminophen (TYLENOL) 325 MG tablet Take 650 mg by mouth once. One hour prior to infusion    . amoxicillin (AMOXIL) 500 MG capsule Take 1 capsule by mouth as directed.  2  . amphetamine-dextroamphetamine (ADDERALL) 10 MG tablet Take 10 mg by mouth daily as needed.  0  . conjugated estrogens (PREMARIN) vaginal cream Use 1/2 gram vaginally twice per week at bedtime 30 g 2  . diphenhydrAMINE (BENADRYL) 25 mg capsule Take 25 mg by mouth once. One hour prior to infusion    . famotidine (PEPCID) 10 MG tablet Take 10 mg by mouth daily.    . fluorouracil (EFUDEX) 5 % cream Apply 1 application topically daily.    . fluticasone (FLONASE) 50 MCG/ACT nasal spray Place 1 spray into both nostrils daily.    Marland Kitchen ibuprofen (ADVIL,MOTRIN) 200 MG tablet Take 400 mg by mouth every 6 (six) hours as needed (for inflammation).    Marland Kitchen lidocaine (LIDODERM) 5 % Place 1 patch onto the skin daily as needed (for shoulder pain).     . Magnesium 250  MG TABS Take 4 tablets by mouth daily.    . mometasone-formoterol (DULERA) 100-5 MCG/ACT AERO Inhale 1 puff into the lungs daily.     . Omega-3 Fatty Acids (FISH OIL) 1000 MG CAPS Take 1,000 mg by mouth daily.    . predniSONE (DELTASONE) 10 MG tablet as directed.     . promethazine (PHENERGAN) 25 MG tablet Take 25 mg by mouth every 6 (six) hours as needed for nausea or vomiting.    . RESTASIS 0.05 % ophthalmic emulsion Place 1 drop into both eyes 2 (two) times daily.    . Sodium Fluoride 1.1 % PSTE Apply small ribbon on tooth brush. Brush for 2 minutes. Expectorate and do not rinse for 79minutes.Do this at least 2x daily.    . temazepam (RESTORIL) 30 MG capsule Take 1 capsule by mouth at bedtime.    . VENTOLIN HFA  108 (90 BASE) MCG/ACT inhaler Inhale 2 puffs into the lungs every 4 (four) hours as needed.    . Vitamin D, Ergocalciferol, (DRISDOL) 50000 units CAPS capsule Take 1 capsule (50,000 Units total) by mouth every 7 (seven) days. 12 capsule 4   No current facility-administered medications for this visit.     Family History  Problem Relation Age of Onset  . Diabetes Mother   . Heart failure Mother   . Bladder Cancer Mother   . Stroke Mother   . Cancer Father        head and neck  . Uterine cancer Maternal Aunt   . Bladder Cancer Maternal Grandmother   . Colon cancer Maternal Grandmother     Review of Systems  All other systems reviewed and are negative.   Exam:   BP 122/66 (BP Location: Right Arm, Patient Position: Sitting, Cuff Size: Normal)   Pulse 84   Resp 16   Ht 5' 1.5" (1.562 m)   Wt 127 lb 3.2 oz (57.7 kg)   LMP 02/15/1985 (Approximate)   BMI 23.65 kg/m   Height:   Height: 5' 1.5" (156.2 cm)  Ht Readings from Last 3 Encounters:  07/04/17 5' 1.5" (1.562 m)  05/19/17 5\' 2"  (1.575 m)  03/24/17 5\' 3"  (1.6 m)    General appearance: alert, cooperative and appears stated age Head: Normocephalic, without obvious abnormality, atraumatic Neck: no adenopathy, supple, symmetrical, trachea midline and thyroid normal to inspection and palpation Lungs: clear to auscultation bilaterally Breasts: normal appearance, no masses or tenderness Heart: regular rate and rhythm Abdomen: soft, non-tender; bowel sounds normal; no masses,  no organomegaly Extremities: extremities normal, atraumatic, no cyanosis or edema Skin: Skin color, texture, turgor normal. No rashes or lesions Lymph nodes: Cervical, supraclavicular, and axillary nodes normal. No abnormal inguinal nodes palpated Neurologic: Grossly normal   Pelvic: External genitalia:  no lesions              Urethra:  normal appearing urethra with no masses, tenderness or lesions              Bartholins and Skenes: normal                  Vagina: normal appearing vagina with normal color and discharge, no lesions              Cervix: absent              Pap taken: No. Bimanual Exam:  Uterus:  uterus absent              Adnexa: no mass, fullness, tenderness  Rectovaginal: Confirms               Anus:  normal sphincter tone, no lesions  Chaperone was present for exam.  A:  Well Woman with normal exam H/o left breast cancer 6/03.  ER/PR negative H/O urethral prolapse.  Uses estrogen vaginal cream. Rheuamtoid arthritis H/o TVH with A&P 1989, s/p laparoscopic BSO due to right serous cystoadenoma 6/15 Osteoporosis Vit D deficiency  P:   Mammogram guidelines pap smear not indicated Vit D 50K weekly.  #12/4RF to pharmacy. Premarin vaginal cream rx to pharmacy. Vit D, Lipids return annually or prn

## 2017-07-04 NOTE — Addendum Note (Signed)
Addended by: Megan Salon on: 07/04/2017 02:36 PM   Modules accepted: Orders

## 2017-07-06 ENCOUNTER — Telehealth: Payer: Self-pay | Admitting: Obstetrics & Gynecology

## 2017-07-06 NOTE — Telephone Encounter (Signed)
Patient would like to speak with nurse about a prescription and switching it to a compound pharmacy.

## 2017-07-06 NOTE — Telephone Encounter (Signed)
Patient requesting alternative Rx for premarin vaginal cream d/t cost. Request to [facility]. Advised will review with Dr. Sabra Heck and return call once RX has been sent. Patient agreeable.   Dr. Sabra Heck -Rx pended for Estrace or Estradiol 0.02% vaginal cream. Place 1/2 gram vaginally twice per week at bedtime #24/3RF. Ok to send?

## 2017-07-07 ENCOUNTER — Other Ambulatory Visit: Payer: Medicare Other

## 2017-07-07 DIAGNOSIS — E559 Vitamin D deficiency, unspecified: Secondary | ICD-10-CM

## 2017-07-07 DIAGNOSIS — E7800 Pure hypercholesterolemia, unspecified: Secondary | ICD-10-CM

## 2017-07-08 LAB — VITAMIN D 25 HYDROXY (VIT D DEFICIENCY, FRACTURES): Vit D, 25-Hydroxy: 26.1 ng/mL — ABNORMAL LOW (ref 30.0–100.0)

## 2017-07-08 LAB — LIPID PANEL
CHOL/HDL RATIO: 3.4 ratio (ref 0.0–4.4)
Cholesterol, Total: 228 mg/dL — ABNORMAL HIGH (ref 100–199)
HDL: 68 mg/dL (ref >39)
LDL CALC: 142 mg/dL — AB (ref 0–99)
TRIGLYCERIDES: 91 mg/dL (ref 0–149)
VLDL Cholesterol Cal: 18 mg/dL (ref 5–40)

## 2017-07-08 MED ORDER — NONFORMULARY OR COMPOUNDED ITEM: 3 refills | Status: AC

## 2017-07-08 NOTE — Telephone Encounter (Signed)
Rx signed and on your desk to fax to [facility].  Thank you.

## 2017-07-08 NOTE — Telephone Encounter (Signed)
Rx faxed to [facility]. Patient notified. Advised to allow for 24 hrs for medication to be prepared. Patient verbalizes understanding and is agreeable.

## 2017-07-12 ENCOUNTER — Telehealth: Payer: Self-pay | Admitting: *Deleted

## 2017-07-12 NOTE — Telephone Encounter (Signed)
LM for pt to call back.

## 2017-07-12 NOTE — Telephone Encounter (Signed)
-----   Message from Megan Salon, MD sent at 07/08/2017  5:29 PM EDT ----- Please let pt know her Vit D was 26.  Goal is >30.  She is taking prescription Vit D so I don't want to increase this dosage as it is close to 30.  She can add 800IU OTC VIT D daily if she desires but I am ok with her taking the prescription only.

## 2017-07-13 NOTE — Telephone Encounter (Signed)
Left voicemail to call back. Second attempt.  

## 2017-07-14 NOTE — Telephone Encounter (Signed)
Spoke with patient, advised as seen below per Dr. Sabra Heck. Patient verbalizes understanding and is agreeable. Will close encounter.

## 2017-07-14 NOTE — Telephone Encounter (Signed)
Patient returning Reina's call. °

## 2017-07-20 DIAGNOSIS — M0519 Rheumatoid lung disease with rheumatoid arthritis of multiple sites: Secondary | ICD-10-CM | POA: Diagnosis not present

## 2017-07-20 DIAGNOSIS — M069 Rheumatoid arthritis, unspecified: Secondary | ICD-10-CM | POA: Diagnosis not present

## 2017-07-20 NOTE — Addendum Note (Signed)
Addended by: Inis Sizer D on: 07/20/2017 09:45 AM   Modules accepted: Orders

## 2017-07-21 ENCOUNTER — Telehealth: Payer: Self-pay | Admitting: *Deleted

## 2017-07-21 LAB — COMPREHENSIVE METABOLIC PANEL
A/G RATIO: 2 (ref 1.2–2.2)
ALBUMIN: 4 g/dL (ref 3.6–4.8)
ALK PHOS: 59 IU/L (ref 39–117)
ALT: 11 IU/L (ref 0–32)
AST: 15 IU/L (ref 0–40)
BUN / CREAT RATIO: 24 (ref 12–28)
BUN: 11 mg/dL (ref 8–27)
Bilirubin Total: 0.3 mg/dL (ref 0.0–1.2)
CO2: 23 mmol/L (ref 20–29)
CREATININE: 0.46 mg/dL — AB (ref 0.57–1.00)
Calcium: 9 mg/dL (ref 8.7–10.3)
Chloride: 105 mmol/L (ref 96–106)
GFR calc Af Amer: 121 mL/min/{1.73_m2} (ref >59)
GFR calc non Af Amer: 105 mL/min/{1.73_m2} (ref >59)
GLOBULIN, TOTAL: 2 g/dL (ref 1.5–4.5)
Glucose: 81 mg/dL (ref 65–99)
Potassium: 4 mmol/L (ref 3.5–5.2)
SODIUM: 143 mmol/L (ref 134–144)
Total Protein: 6 g/dL (ref 6.0–8.5)

## 2017-07-21 LAB — CBC WITH DIFFERENTIAL/PLATELET
Basophils Absolute: 0 10*3/uL (ref 0.0–0.2)
Basos: 1 %
EOS (ABSOLUTE): 0.3 10*3/uL (ref 0.0–0.4)
EOS: 4 %
HEMATOCRIT: 35.9 % (ref 34.0–46.6)
Hemoglobin: 11.3 g/dL (ref 11.1–15.9)
Immature Grans (Abs): 0 10*3/uL (ref 0.0–0.1)
Immature Granulocytes: 0 %
LYMPHS ABS: 1.8 10*3/uL (ref 0.7–3.1)
Lymphs: 27 %
MCH: 27.5 pg (ref 26.6–33.0)
MCHC: 31.5 g/dL (ref 31.5–35.7)
MCV: 87 fL (ref 79–97)
MONOS ABS: 0.5 10*3/uL (ref 0.1–0.9)
Monocytes: 8 %
Neutrophils Absolute: 4 10*3/uL (ref 1.4–7.0)
Neutrophils: 60 %
Platelets: 374 10*3/uL (ref 150–450)
RBC: 4.11 x10E6/uL (ref 3.77–5.28)
RDW: 14 % (ref 12.3–15.4)
WBC: 6.7 10*3/uL (ref 3.4–10.8)

## 2017-07-21 NOTE — Telephone Encounter (Signed)
Spoke with Kristy Perez and reviewed below lab results.  She verbalized understanding of same and requested results be faxed to her at [phone], which I have done/fim

## 2017-07-21 NOTE — Telephone Encounter (Signed)
-----   Message from Britt Bottom, MD sent at 07/21/2017  8:39 AM EDT ----- Please let the patient know that the lab work is fine.

## 2017-08-01 ENCOUNTER — Telehealth (INDEPENDENT_AMBULATORY_CARE_PROVIDER_SITE_OTHER): Payer: Self-pay | Admitting: *Deleted

## 2017-08-01 NOTE — Telephone Encounter (Signed)
Pt scheduled for 7/16 with driver.

## 2017-08-01 NOTE — Telephone Encounter (Signed)
Yes, right L2 TF esi

## 2017-08-01 NOTE — Telephone Encounter (Signed)
Does not require pre auth

## 2017-08-09 DIAGNOSIS — D485 Neoplasm of uncertain behavior of skin: Secondary | ICD-10-CM | POA: Diagnosis not present

## 2017-08-09 DIAGNOSIS — B079 Viral wart, unspecified: Secondary | ICD-10-CM | POA: Diagnosis not present

## 2017-08-10 DIAGNOSIS — B079 Viral wart, unspecified: Secondary | ICD-10-CM | POA: Diagnosis not present

## 2017-08-10 DIAGNOSIS — D485 Neoplasm of uncertain behavior of skin: Secondary | ICD-10-CM | POA: Diagnosis not present

## 2017-08-17 DIAGNOSIS — M0519 Rheumatoid lung disease with rheumatoid arthritis of multiple sites: Secondary | ICD-10-CM | POA: Diagnosis not present

## 2017-08-30 ENCOUNTER — Ambulatory Visit (INDEPENDENT_AMBULATORY_CARE_PROVIDER_SITE_OTHER): Payer: Self-pay

## 2017-08-30 ENCOUNTER — Encounter (INDEPENDENT_AMBULATORY_CARE_PROVIDER_SITE_OTHER): Payer: Self-pay | Admitting: Physical Medicine and Rehabilitation

## 2017-08-30 ENCOUNTER — Ambulatory Visit (INDEPENDENT_AMBULATORY_CARE_PROVIDER_SITE_OTHER): Payer: Medicare Other | Admitting: Physical Medicine and Rehabilitation

## 2017-08-30 VITALS — BP 120/70 | HR 67

## 2017-08-30 DIAGNOSIS — M5416 Radiculopathy, lumbar region: Secondary | ICD-10-CM | POA: Diagnosis not present

## 2017-08-30 MED ORDER — DEXAMETHASONE SODIUM PHOSPHATE 10 MG/ML IJ SOLN
15.0000 mg | Freq: Once | INTRAMUSCULAR | Status: AC
  Administered 2017-08-30: 15 mg

## 2017-08-30 NOTE — Progress Notes (Signed)
 .  Numeric Pain Rating Scale and Functional Assessment Average Pain 5   In the last MONTH (on 0-10 scale) has pain interfered with the following?  1. General activity like being  able to carry out your everyday physical activities such as walking, climbing stairs, carrying groceries, or moving a chair?  Rating(2)   +Driver, -BT, -Dye Allergies(Iodine).

## 2017-08-30 NOTE — Patient Instructions (Signed)

## 2017-09-12 NOTE — Progress Notes (Signed)
Kristy Perez - 67 y.o. female MRN [id number]  Date of birth: 1950-09-18  Office Visit Note: Visit Date: 08/30/2017 PCP: Kelton Pillar, MD Referred by: Kelton Pillar, MD  Subjective: Chief Complaint  Patient presents with  . Lower Back - Pain  . Right Hip - Pain   HPI: Kristy Perez is a 67 year old female with chronic history of right radicular hip pain which we feel like is due to foraminal extraforaminal disc herniation at the L2-3 position on the right.  We have completed 3 epidurals over the last year and each 1 giving her relief for usually around 3 months.  The last one only seem to help for about a month.  This was completed at the L5 level where she has foraminal narrowing.  She also has pretty significant foraminal stenosis at L5-S1 because of listhesis and degenerative disc height loss.  We are going to repeat the L2 injection today and just see how she does.  If it only last about a month I would try one more injection at the L5 level but otherwise she may need to at least seek evaluation from a neurosurgeon or spine surgeon.   ROS Otherwise per HPI.  Assessment & Plan: Visit Diagnoses:  1. Lumbar radiculopathy     Plan: No additional findings.   Meds & Orders:  Meds ordered this encounter  Medications  . dexamethasone (DECADRON) injection 15 mg    Orders Placed This Encounter  Procedures  . XR C-ARM NO REPORT  . Epidural Steroid injection    Follow-up: No follow-ups on file.   Procedures: No procedures performed  Lumbosacral Transforaminal Epidural Steroid Injection - Sub-Pedicular Approach with Fluoroscopic Guidance  [name]      Date of Birth: Aug 16, 1950 MRN: [id number] PCP: Kelton Pillar, MD      Visit Date: 08/30/2017   Universal Protocol:    Date/Time: 08/30/2017  Consent Given By: the patient  Position: PRONE  Additional Comments: Vital signs were monitored before and after the procedure. Patient was prepped and draped in the  usual sterile fashion. The correct patient, procedure, and site was verified.   Injection Procedure Details:  Procedure Site One Meds Administered:  Meds ordered this encounter  Medications  . dexamethasone (DECADRON) injection 15 mg    Laterality: Right  Location/Site:  L2-L3  Needle size: 27 G  Needle type: Spinal  Needle Placement: Transforaminal  Findings:    -Comments: Excellent flow of contrast along the nerve and into the epidural space.  Procedure Details: After squaring off the end-plates to get a true AP view, the C-arm was positioned so that an oblique view of the foramen as noted above was visualized. The target area is just inferior to the "nose of the scotty dog" or sub pedicular. The soft tissues overlying this structure were infiltrated with 2-3 ml. of 1% Lidocaine without Epinephrine.  The spinal needle was inserted toward the target using a "trajectory" view along the fluoroscope beam.  Under AP and lateral visualization, the needle was advanced so it did not puncture dura and was located close the 6 O'Clock position of the pedical in AP tracterory. Biplanar projections were used to confirm position. Aspiration was confirmed to be negative for CSF and/or blood. A 1-2 ml. volume of Omniscan, this was used because of patient allergy, was injected and flow of contrast was noted at each level. Radiographs were obtained for documentation purposes.   After attaining the desired flow of contrast documented above, a  0.5 to 1.0 ml test dose of 0.25% Marcaine was injected into each respective transforaminal space.  The patient was observed for 90 seconds post injection.  After no sensory deficits were reported, and normal lower extremity motor function was noted,   the above injectate was administered so that equal amounts of the injectate were placed at each foramen (level) into the transforaminal epidural space.   Additional Comments:  The patient tolerated the procedure  well Dressing: Band-Aid    Post-procedure details: Patient was observed during the procedure. Post-procedure instructions were reviewed.  Patient left the clinic in stable condition.    Clinical History: MRI LUMBAR SPINE WITHOUT CONTRAST  TECHNIQUE: Multiplanar, multisequence MR imaging of the lumbar spine was performed. No intravenous contrast was administered.  COMPARISON:  08/22/2007  FINDINGS: Segmentation:  Standard.  Alignment: Chronic bilateral L5 pars defects with 8 mm anterolisthesis of L5 on S1, not significantly changed.  Vertebrae: No fracture, suspicious osseous lesion, or significant marrow edema. Multiple small Schmorl's nodes are again seen scattered throughout the lumbar and lower thoracic spine.  Conus medullaris: Extends to the L1 level and appears normal.  Paraspinal and other soft tissues: Unremarkable.  Disc levels:  Disc desiccation throughout the lumbar spine. Severe disc space narrowing at L5-S1 with mild narrowing at L2-3.  T10-11:  Negative.  T11-12:  New small left central disc protrusion without stenosis.  T12-L1:  Negative.  L1-2:  At most minimal disc bulging without stenosis, unchanged.  L2-3: Progressive disc bulging asymmetric to the right with a right extraforaminal disc protrusion. New mild right neural foraminal stenosis. No definite L2 nerve impingement. No spinal stenosis.  L3-4:  Mild disc bulging without stenosis, unchanged.  L4-5: New minimal disc bulging and mild facet arthrosis without stenosis.  L5-S1: Listhesis, mild bulging of uncovered disc, and disc space height loss result in severe bilateral neural foraminal stenosis, not significantly changed. No spinal stenosis.  IMPRESSION: 1. Chronic L5 pars defects and anterolisthesis with unchanged severe bilateral neural foraminal stenosis. 2. Progressive L2-3 disc degeneration with a new right extraforaminal disc protrusion. Mild right  foraminal stenosis. 3. New small T11-12 disc protrusion without stenosis.   Electronically Signed   By: Logan Bores M.D.   On: 09/15/2016 18:25   She reports that she quit smoking about 18 years ago. Her smoking use included cigarettes. She has never used smokeless tobacco. No results for input(s): HGBA1C, LABURIC in the last 8760 hours.  Objective:  VS:  HT:    WT:   BMI:     BP:120/70  HR:67bpm  TEMP: ( )  RESP:  Physical Exam  Ortho Exam Imaging: No results found.  Past Medical/Family/Surgical/Social History: Medications & Allergies reviewed per EMR, new medications updated. Patient Active Problem List   Diagnosis Date Noted  . DDD (degenerative disc disease), lumbar 12/08/2016  . High risk medication use 03/15/2016  . DJD (degenerative joint disease), cervical 03/15/2016  . History of asthma 03/15/2016  . History of squamous cell carcinoma 03/15/2016  . History of basal cell carcinoma 03/15/2016  . History of breast cancer 03/15/2016  . History of Chiari malformation 03/15/2016  . Vitamin D deficiency 03/15/2016  . Rheumatoid arthritis with rheumatoid factor of multiple sites without organ or systems involvement ([place]) 10/20/2015  . Esophageal reflux 05/01/2015  . Anxiety 05/01/2015  . Sjogrens syndrome ([place]) 05/01/2015  . Chronic obstructive airway disease with asthma ([place]) 05/01/2015  . Deviated nasal septum 05/24/2014    Class: Chronic  . Insomnia 11/25/2007  .  DYSPNEA 11/15/2007   Past Medical History:  Diagnosis Date  . Asthma    inhaler one x per day  . Cancer [facility])    breast, left  . Chiari malformation   . PONV (postoperative nausea and vomiting)   . Rheumatoid arthritis (HCC)    on Humira   Family History  Problem Relation Age of Onset  . Diabetes Mother   . Heart failure Mother   . Bladder Cancer Mother   . Stroke Mother   . Cancer Father        head and neck  . Uterine cancer Maternal Aunt   . Bladder Cancer Maternal Grandmother   .  Colon cancer Maternal Grandmother    Past Surgical History:  Procedure Laterality Date  . BREAST SURGERY Left 2003   Lumpectomy T1C1, NO and negative ER/ PR  . COLONOSCOPY  10/04  . COLONOSCOPY  11/10   normal recheck in 5 years  . COLPORRHAPHY    . ESOPHAGOGASTRODUODENOSCOPY ENDOSCOPY  11/10   GERD  . FOOT SURGERY Left age 26   removal of pseudo rheumatoid nodules from left forefoot.  Marland Kitchen LAPAROSCOPIC BILATERAL SALPINGO OOPHERECTOMY N/A 07/30/2013   Procedure: LAPAROSCOPIC BILATERAL SALPINGO OOPHORECTOMY WITH WASHINGS;  Surgeon: Lyman Speller, MD;  Location: [facility];  Service: Gynecology;  Laterality: N/A;  . NASAL SEPTOPLASTY W/ TURBINOPLASTY Bilateral 05/24/2014   Procedure: NASAL SEPTOPLASTY WITH  BILATERAL TURBINATE REDUCTION;  Surgeon: Jerrell Belfast, MD;  Location: [place];  Service: ENT;  Laterality: Bilateral;  . PELVIC FLOOR REPAIR  3/04   SPARC with AP colporrhaphy  . TOTAL VAGINAL HYSTERECTOMY  1989   with AP repair   Social History   Occupational History  . Not on file  Tobacco Use  . Smoking status: Former Smoker    Types: Cigarettes    Last attempt to quit: 02/16/1999    Years since quitting: 18.5  . Smokeless tobacco: Never Used  Substance and Sexual Activity  . Alcohol use: No  . Drug use: No  . Sexual activity: Not Currently    Partners: Male

## 2017-09-12 NOTE — Procedures (Signed)
Lumbosacral Transforaminal Epidural Steroid Injection - Sub-Pedicular Approach with Fluoroscopic Guidance  [name]      Date of Birth: 1951/01/26 MRN: [id number] PCP: Kelton Pillar, MD      Visit Date: 08/30/2017   Universal Protocol:    Date/Time: 08/30/2017  Consent Given By: the patient  Position: PRONE  Additional Comments: Vital signs were monitored before and after the procedure. Patient was prepped and draped in the usual sterile fashion. The correct patient, procedure, and site was verified.   Injection Procedure Details:  Procedure Site One Meds Administered:  Meds ordered this encounter  Medications  . dexamethasone (DECADRON) injection 15 mg    Laterality: Right  Location/Site:  L2-L3  Needle size: 50 G  Needle type: Spinal  Needle Placement: Transforaminal  Findings:    -Comments: Excellent flow of contrast along the nerve and into the epidural space.  Procedure Details: After squaring off the end-plates to get a true AP view, the C-arm was positioned so that an oblique view of the foramen as noted above was visualized. The target area is just inferior to the "nose of the scotty dog" or sub pedicular. The soft tissues overlying this structure were infiltrated with 2-3 ml. of 1% Lidocaine without Epinephrine.  The spinal needle was inserted toward the target using a "trajectory" view along the fluoroscope beam.  Under AP and lateral visualization, the needle was advanced so it did not puncture dura and was located close the 6 O'Clock position of the pedical in AP tracterory. Biplanar projections were used to confirm position. Aspiration was confirmed to be negative for CSF and/or blood. A 1-2 ml. volume of Omniscan, this was used because of patient allergy, was injected and flow of contrast was noted at each level. Radiographs were obtained for documentation purposes.   After attaining the desired flow of contrast documented above, a 0.5 to  1.0 ml test dose of 0.25% Marcaine was injected into each respective transforaminal space.  The patient was observed for 90 seconds post injection.  After no sensory deficits were reported, and normal lower extremity motor function was noted,   the above injectate was administered so that equal amounts of the injectate were placed at each foramen (level) into the transforaminal epidural space.   Additional Comments:  The patient tolerated the procedure well Dressing: Band-Aid    Post-procedure details: Patient was observed during the procedure. Post-procedure instructions were reviewed.  Patient left the clinic in stable condition.

## 2017-09-14 DIAGNOSIS — M0519 Rheumatoid lung disease with rheumatoid arthritis of multiple sites: Secondary | ICD-10-CM | POA: Diagnosis not present

## 2017-09-14 DIAGNOSIS — M069 Rheumatoid arthritis, unspecified: Secondary | ICD-10-CM | POA: Diagnosis not present

## 2017-09-14 NOTE — Addendum Note (Signed)
Addended by: Inis Sizer D on: 09/14/2017 09:35 AM   Modules accepted: Orders

## 2017-09-15 LAB — CBC WITH DIFFERENTIAL/PLATELET
BASOS: 0 %
Basophils Absolute: 0 10*3/uL (ref 0.0–0.2)
EOS (ABSOLUTE): 0.2 10*3/uL (ref 0.0–0.4)
EOS: 3 %
HEMATOCRIT: 37.1 % (ref 34.0–46.6)
HEMOGLOBIN: 12.1 g/dL (ref 11.1–15.9)
IMMATURE GRANS (ABS): 0 10*3/uL (ref 0.0–0.1)
IMMATURE GRANULOCYTES: 0 %
LYMPHS: 24 %
Lymphocytes Absolute: 1.7 10*3/uL (ref 0.7–3.1)
MCH: 28.7 pg (ref 26.6–33.0)
MCHC: 32.6 g/dL (ref 31.5–35.7)
MCV: 88 fL (ref 79–97)
Monocytes Absolute: 0.4 10*3/uL (ref 0.1–0.9)
Monocytes: 6 %
NEUTROS ABS: 4.7 10*3/uL (ref 1.4–7.0)
NEUTROS PCT: 67 %
Platelets: 342 10*3/uL (ref 150–450)
RBC: 4.21 x10E6/uL (ref 3.77–5.28)
RDW: 13.9 % (ref 12.3–15.4)
WBC: 7.1 10*3/uL (ref 3.4–10.8)

## 2017-09-15 LAB — COMPREHENSIVE METABOLIC PANEL
ALT: 17 IU/L (ref 0–32)
AST: 18 IU/L (ref 0–40)
Albumin/Globulin Ratio: 2.5 — ABNORMAL HIGH (ref 1.2–2.2)
Albumin: 4.3 g/dL (ref 3.6–4.8)
Alkaline Phosphatase: 63 IU/L (ref 39–117)
BUN/Creatinine Ratio: 26 (ref 12–28)
BUN: 13 mg/dL (ref 8–27)
Bilirubin Total: 0.4 mg/dL (ref 0.0–1.2)
CALCIUM: 8.9 mg/dL (ref 8.7–10.3)
CO2: 19 mmol/L — AB (ref 20–29)
CREATININE: 0.5 mg/dL — AB (ref 0.57–1.00)
Chloride: 101 mmol/L (ref 96–106)
GFR, EST AFRICAN AMERICAN: 117 mL/min/{1.73_m2} (ref >59)
GFR, EST NON AFRICAN AMERICAN: 102 mL/min/{1.73_m2} (ref >59)
GLOBULIN, TOTAL: 1.7 g/dL (ref 1.5–4.5)
Glucose: 87 mg/dL (ref 65–99)
Potassium: 3.9 mmol/L (ref 3.5–5.2)
Sodium: 137 mmol/L (ref 134–144)
TOTAL PROTEIN: 6 g/dL (ref 6.0–8.5)

## 2017-09-16 ENCOUNTER — Telehealth (INDEPENDENT_AMBULATORY_CARE_PROVIDER_SITE_OTHER): Payer: Self-pay | Admitting: Physical Medicine and Rehabilitation

## 2017-09-16 ENCOUNTER — Telehealth: Payer: Self-pay

## 2017-09-16 NOTE — Telephone Encounter (Signed)
Injection before this on was at L5 position, transforaminal, could do that and see if substantially better and would give [place] good idea of next step

## 2017-09-16 NOTE — Telephone Encounter (Signed)
-----   Message from Britt Bottom, MD sent at 09/15/2017  8:22 AM EDT ----- Please let the patient know that the lab work is fine.

## 2017-09-16 NOTE — Telephone Encounter (Signed)
I called and spoke with patient and made her aware of normal lab results. Patient voiced understanding and requested a copy be faxed to her at [phone]. I have faxed copy to her.

## 2017-09-19 ENCOUNTER — Telehealth (INDEPENDENT_AMBULATORY_CARE_PROVIDER_SITE_OTHER): Payer: Self-pay | Admitting: *Deleted

## 2017-09-19 NOTE — Telephone Encounter (Signed)
Pt states she is feeling a little better and would like to proceed with Bil L5-S1 esi, pt is scheduled with driver 6/55/37.

## 2017-09-19 NOTE — Telephone Encounter (Signed)
Per [employer] (Medicare) pt insurance does not require prior auth.

## 2017-09-19 NOTE — Telephone Encounter (Signed)
Can you call to schedule/ discuss?

## 2017-10-03 ENCOUNTER — Ambulatory Visit (INDEPENDENT_AMBULATORY_CARE_PROVIDER_SITE_OTHER): Payer: Medicare Other | Admitting: Physical Medicine and Rehabilitation

## 2017-10-03 ENCOUNTER — Encounter (INDEPENDENT_AMBULATORY_CARE_PROVIDER_SITE_OTHER): Payer: Self-pay | Admitting: Physical Medicine and Rehabilitation

## 2017-10-03 ENCOUNTER — Ambulatory Visit (INDEPENDENT_AMBULATORY_CARE_PROVIDER_SITE_OTHER): Payer: Self-pay

## 2017-10-03 VITALS — BP 103/59 | HR 75

## 2017-10-03 DIAGNOSIS — M4316 Spondylolisthesis, lumbar region: Secondary | ICD-10-CM

## 2017-10-03 DIAGNOSIS — M5416 Radiculopathy, lumbar region: Secondary | ICD-10-CM

## 2017-10-03 DIAGNOSIS — Q762 Congenital spondylolisthesis: Secondary | ICD-10-CM | POA: Diagnosis not present

## 2017-10-03 DIAGNOSIS — M5116 Intervertebral disc disorders with radiculopathy, lumbar region: Secondary | ICD-10-CM

## 2017-10-03 MED ORDER — METHYLPREDNISOLONE ACETATE 80 MG/ML IJ SUSP
80.0000 mg | Freq: Once | INTRAMUSCULAR | Status: AC
  Administered 2017-10-03: 80 mg

## 2017-10-03 NOTE — Progress Notes (Signed)
 .  Numeric Pain Rating Scale and Functional Assessment Average Pain 5   In the last MONTH (on 0-10 scale) has pain interfered with the following?  1. General activity like being  able to carry out your everyday physical activities such as walking, climbing stairs, carrying groceries, or moving a chair?  Rating(4)   +Driver, -BT, +Dye Allergies(iodine).

## 2017-10-03 NOTE — Patient Instructions (Signed)

## 2017-10-05 NOTE — Progress Notes (Signed)
Office Visit Note  [name]             Date of Birth: 11-Jun-1950           MRN: [id number]             PCP: Kelton Pillar, MD Referring: Kelton Pillar, MD Visit Date: 10/19/2017 Occupation: @GUAROCC @  Subjective:  Joint stiffness.   History of Present Illness: Kristy Perez is a 67 y.o. female with history of seropositive rheumatoid arthritis.  She states she has been experiencing some stiffness a week prior to her next infusion of Orencia.  She has not noticed any joint swelling.  She continues to have some discomfort due to disc disease of cervical and lumbar spine.  She has been getting injections in her lower back by Dr. Ernestina Patches.  Activities of Daily Living:  Patient reports morning stiffness for 0 minute.   Patient Denies nocturnal pain.  Difficulty dressing/grooming: Denies Difficulty climbing stairs: Denies Difficulty getting out of chair: Denies Difficulty using hands for taps, buttons, cutlery, and/or writing: Denies  Review of Systems  Constitutional: Positive for fatigue. Negative for night sweats, weight gain and weight loss.  HENT: Positive for mouth dryness. Negative for mouth sores, trouble swallowing, trouble swallowing and nose dryness.   Eyes: Positive for dryness. Negative for pain, redness and visual disturbance.  Respiratory: Negative for cough, shortness of breath and difficulty breathing.   Cardiovascular: Negative for chest pain, palpitations, hypertension, irregular heartbeat and swelling in legs/feet.  Gastrointestinal: Positive for constipation. Negative for blood in stool and diarrhea.  Endocrine: Negative for increased urination.  Genitourinary: Negative for vaginal dryness.  Musculoskeletal: Positive for arthralgias and joint pain. Negative for joint swelling, myalgias, muscle weakness, morning stiffness, muscle tenderness and myalgias.  Skin: Negative for color change, rash, hair loss, skin tightness, ulcers and sensitivity to  sunlight.  Allergic/Immunologic: Negative for susceptible to infections.  Neurological: Negative for dizziness, memory loss, night sweats and weakness.  Hematological: Negative for swollen glands.  Psychiatric/Behavioral: Positive for sleep disturbance. Negative for depressed mood. The patient is not nervous/anxious.     PMFS History:  Patient Active Problem List   Diagnosis Date Noted  . DDD (degenerative disc disease), lumbar 12/08/2016  . High risk medication use 03/15/2016  . DJD (degenerative joint disease), cervical 03/15/2016  . History of asthma 03/15/2016  . History of squamous cell carcinoma 03/15/2016  . History of basal cell carcinoma 03/15/2016  . History of breast cancer 03/15/2016  . History of Chiari malformation 03/15/2016  . Vitamin D deficiency 03/15/2016  . Rheumatoid arthritis with rheumatoid factor of multiple sites without organ or systems involvement ([place]) 10/20/2015  . Esophageal reflux 05/01/2015  . Anxiety 05/01/2015  . Sjogrens syndrome ([place]) 05/01/2015  . Chronic obstructive airway disease with asthma ([place]) 05/01/2015  . Deviated nasal septum 05/24/2014    Class: Chronic  . Insomnia 11/25/2007  . DYSPNEA 11/15/2007    Past Medical History:  Diagnosis Date  . Asthma    inhaler one x per day  . Cancer [facility])    breast, left  . Chiari malformation   . PONV (postoperative nausea and vomiting)   . Rheumatoid arthritis (HCC)    on Humira    Family History  Problem Relation Age of Onset  . Diabetes Mother   . Heart failure Mother   . Bladder Cancer Mother   . Stroke Mother   . Cancer Father        head and  neck  . Uterine cancer Maternal Aunt   . Bladder Cancer Maternal Grandmother   . Colon cancer Maternal Grandmother    Past Surgical History:  Procedure Laterality Date  . BREAST SURGERY Left 2003   Lumpectomy T1C1, NO and negative ER/ PR  . COLONOSCOPY  10/04  . COLONOSCOPY  11/10   normal recheck in 5 years  . COLPORRHAPHY    .  ESOPHAGOGASTRODUODENOSCOPY ENDOSCOPY  11/10   GERD  . FOOT SURGERY Left age 22   removal of pseudo rheumatoid nodules from left forefoot.  Marland Kitchen LAPAROSCOPIC BILATERAL SALPINGO OOPHERECTOMY N/A 07/30/2013   Procedure: LAPAROSCOPIC BILATERAL SALPINGO OOPHORECTOMY WITH WASHINGS;  Surgeon: Lyman Speller, MD;  Location: [place] [facility];  Service: Gynecology;  Laterality: N/A;  . NASAL SEPTOPLASTY W/ TURBINOPLASTY Bilateral 05/24/2014   Procedure: NASAL SEPTOPLASTY WITH  BILATERAL TURBINATE REDUCTION;  Surgeon: Jerrell Belfast, MD;  Location: [place];  Service: ENT;  Laterality: Bilateral;  . PELVIC FLOOR REPAIR  3/04   SPARC with AP colporrhaphy  . TOTAL VAGINAL HYSTERECTOMY  1989   with AP repair   Social History   Social History Narrative   Lives alone in a 2 story home. Has 2 children.  Works as a Radiation protection practitioner.  Education: college.     Objective: Vital Signs: BP (!) 90/58 (BP Location: Left Arm, Patient Position: Sitting, Cuff Size: Normal)   Pulse 73   Resp 15   Ht 5\' 2"  (1.575 m)   Wt 126 lb 9.6 oz (57.4 kg)   LMP 02/15/1985 (Approximate)   BMI 23.16 kg/m    Physical Exam  Constitutional: She is oriented to person, place, and time. She appears well-developed and well-nourished.  HENT:  Head: Normocephalic and atraumatic.  Eyes: Conjunctivae and EOM are normal.  Neck: Normal range of motion.  Cardiovascular: Normal rate, regular rhythm, normal heart sounds and intact distal pulses.  Pulmonary/Chest: Effort normal and breath sounds normal.  Abdominal: Soft. Bowel sounds are normal.  Lymphadenopathy:    She has no cervical adenopathy.  Neurological: She is alert and oriented to person, place, and time.  Skin: Skin is warm and dry. Capillary refill takes less than 2 seconds.  Psychiatric: She has a normal mood and affect. Her behavior is normal.  Nursing note and vitals reviewed.    Musculoskeletal Exam: She has limitation of range of motion of her cervical and lumbar spine.  Shoulder  joints elbow joints were in good range of motion.  She has some limitation with range of motion of bilateral wrist joints with no synovitis.  She has bilateral MCP ulnar deviation with synovial thickening without synovitis.  PIP thickening was noted.  Hip joints and knee joints were in good range of motion.  Ankle joints MTPs PIPs were in good range of motion without any synovitis.  CDAI Exam: CDAI Score: 0.4  Patient Global Assessment: 2 (mm); Provider Global Assessment: 2 (mm) Swollen: 0 ; Tender: 0  Joint Exam   Not documented   There is currently no information documented on the homunculus. Go to the Rheumatology activity and complete the homunculus joint exam.  Investigation: No additional findings.  Imaging: Xr C-arm No Report  Result Date: 10/03/2017 Please see Notes tab for imaging impression.   Recent Labs: Lab Results  Component Value Date   WBC 7.1 09/14/2017   HGB 12.1 09/14/2017   PLT 342 09/14/2017   NA 137 09/14/2017   K 3.9 09/14/2017   CL 101 09/14/2017   CO2 19 (L) 09/14/2017  GLUCOSE 87 09/14/2017   BUN 13 09/14/2017   CREATININE 0.50 (L) 09/14/2017   BILITOT 0.4 09/14/2017   ALKPHOS 63 09/14/2017   AST 18 09/14/2017   ALT 17 09/14/2017   PROT 6.0 09/14/2017   ALBUMIN 4.3 09/14/2017   CALCIUM 8.9 09/14/2017   GFRAA 117 09/14/2017   QFTBGOLD Negative 06/15/2016    Speciality Comments: Orencia IV 500mg  every 4 weeks, patient infuses at [place] Tb gold negative 06/15/16  Procedures:  No procedures performed Allergies: Fish allergy; Iodine; Erythromycin; and Sulfonamide derivatives   Assessment / Plan:     Visit Diagnoses: Rheumatoid arthritis with rheumatoid factor of multiple sites without organ or systems involvement ([place]) - +RF, +CCP.  Patient states that she has been having increased arthralgias prior to having next Orencia infusion.  I do not see any synovitis on examination today.  She had recent Orencia infusion.  I discussed the option of adding  Plaquenil as a bridging therapy but she declined.  She does not want to take methotrexate either.  We will observe for right now.  High risk medication use - Orencia IV every 4 weeks (inadequate response to Enbrel and Humira) - Plan: QuantiFERON-TB Gold Plus today.  Her CBC and CMP have been stable.  DDD (degenerative disc disease), cervical-she has some stiffness and discomfort which is tolerable.  DDD (degenerative disc disease), lumbar-she has some lower back pain.  Vitamin D deficiency-she is on vitamin D supplement.  Age-related osteoporosis without current pathological fracture - DXA 12/28/16 T -2.6.  I reviewed her bone density.  She does not want to take any bisphosphonates.  Her bone density has been stable over the last 2 years.  This point she wants to continue with calcium vitamin D and exercises.  Primary insomnia-manageable with medications.  Other medical problems are listed as follows:  Gastroesophageal reflux disease without esophagitis  History of Chiari malformation  History of breast cancer  History of squamous cell carcinoma  History of asthma   Orders: Orders Placed This Encounter  Procedures  . QuantiFERON-TB Gold Plus   No orders of the defined types were placed in this encounter.   Face-to-face time spent with patient was 30 minutes. Greater than 50% of time was spent in counseling and coordination of care.  Follow-Up Instructions: Return in about 5 months (around 03/21/2018) for Rheumatoid arthritis,DDD.   Bo Merino, MD  Note - This record has been created using Editor, commissioning.  Chart creation errors have been sought, but may not always  have been located. Such creation errors do not reflect on  the standard of medical care.

## 2017-10-11 ENCOUNTER — Telehealth (INDEPENDENT_AMBULATORY_CARE_PROVIDER_SITE_OTHER): Payer: Self-pay | Admitting: Physical Medicine and Rehabilitation

## 2017-10-11 DIAGNOSIS — H2513 Age-related nuclear cataract, bilateral: Secondary | ICD-10-CM | POA: Diagnosis not present

## 2017-10-11 NOTE — Telephone Encounter (Signed)
Awesome!

## 2017-10-11 NOTE — Progress Notes (Signed)
[name] - 67 y.o. female MRN [id number]  Date of birth: 1950/02/21  Office Visit Note: Visit Date: 10/03/2017 PCP: Kelton Pillar, MD Referred by: Kelton Pillar, MD  Subjective: Chief Complaint  Patient presents with  . Lower Back - Pain  . Right Hip - Pain  . Right Leg - Pain   HPI: [name] is a 67 year old female that returns today once again with chronic worsening back pain right much more than left radiating to the hip and thigh.  Pain radiates over the greater trochanter laterally.  Her interesting course can be reviewed in our prior notes but basically she had issue with small protrusion at L2-3 with injection at that level that seemed to help quite a bit but we have never been able to return to that level of response.  She has a listhesis of L5 on S1 with pars defect.  There is some foraminal narrowing and lateral recess narrowing.  Significant facet arthropathy.  I think at this point we have had a long talk with her we are going to complete a diagnostic and hopefully therapeutic facet joint block on the right at the L5-S1 facet joint and pars defect.  We would looking at this moment nerve standpoint it may be more related to the mechanical part.  Unfortunately she is not getting much relief with the injections we need to look at a different approach may be with medication type management core strengthening physical therapy etc.  She will continue to follow-up with orthopedics as well.  Obviously could entertain the idea of lumbar fusion at this level although that would be a bigger surgery and more drastic course but obviously an option.   ROS Otherwise per HPI.  Assessment & Plan: Visit Diagnoses:  1. Lumbar radiculopathy   2. Spondylolisthesis of lumbar region   3. Congenital spondylolysis   4. Radiculopathy due to lumbar intervertebral disc disorder     Plan: No additional findings.   Meds & Orders:  Meds ordered this encounter  Medications  .  methylPREDNISolone acetate (DEPO-MEDROL) injection 80 mg    Orders Placed This Encounter  Procedures  . Facet Injection  . XR C-ARM NO REPORT    Follow-up: Return if symptoms worsen or fail to improve.   Procedures: No procedures performed  Lumbar Facet Joint Intra-Articular Injection(s) with Fluoroscopic Guidance  Patient: Kristy ELLENDER      Date of Birth: [birth year] MRN: [id number] PCP: Kelton Pillar, MD      Visit Date: 10/03/2017   Universal Protocol:    Date/Time: 10/03/2017  Consent Given By: the patient  Position: PRONE   Additional Comments: Vital signs were monitored before and after the procedure. Patient was prepped and draped in the usual sterile fashion. The correct patient, procedure, and site was verified.   Injection Procedure Details:  Procedure Site One Meds Administered:  Meds ordered this encounter  Medications  . methylPREDNISolone acetate (DEPO-MEDROL) injection 80 mg     Laterality: Right  Location/Site:  L5-S1  Needle size: 22 guage  Needle type: Spinal  Needle Placement: Articular  Findings:  -Comments: Excellent flow of contrast producing a partial arthrogram.  Procedure Details: The fluoroscope beam is vertically oriented in AP, and the inferior recess is visualized beneath the lower pole of the inferior apophyseal process, which represents the target point for needle insertion. When direct visualization is difficult the target point is located at the medial projection of the vertebral pedicle. The region overlying each aforementioned target is  locally anesthetized with a 1 to 2 ml. volume of 1% Lidocaine without Epinephrine.   The spinal needle was inserted into each of the above mentioned facet joints using biplanar fluoroscopic guidance. A 0.25 to 0.5 ml. volume of Isovue-250 was injected and a partial facet joint arthrogram was obtained. A single spot film was obtained of the resulting arthrogram.    One to 1.25 ml of the  steroid/anesthetic solution was then injected into each of the facet joints noted above.   Additional Comments:  The patient tolerated the procedure well Dressing: Band-Aid    Post-procedure details: Patient was observed during the procedure. Post-procedure instructions were reviewed.  Patient left the clinic in stable condition.    Clinical History: MRI LUMBAR SPINE WITHOUT CONTRAST  TECHNIQUE: Multiplanar, multisequence MR imaging of the lumbar spine was performed. No intravenous contrast was administered.  COMPARISON:  08/22/2007  FINDINGS: Segmentation:  Standard.  Alignment: Chronic bilateral L5 pars defects with 8 mm anterolisthesis of L5 on S1, not significantly changed.  Vertebrae: No fracture, suspicious osseous lesion, or significant marrow edema. Multiple small Schmorl's nodes are again seen scattered throughout the lumbar and lower thoracic spine.  Conus medullaris: Extends to the L1 level and appears normal.  Paraspinal and other soft tissues: Unremarkable.  Disc levels:  Disc desiccation throughout the lumbar spine. Severe disc space narrowing at L5-S1 with mild narrowing at L2-3.  T10-11:  Negative.  T11-12:  New small left central disc protrusion without stenosis.  T12-L1:  Negative.  L1-2:  At most minimal disc bulging without stenosis, unchanged.  L2-3: Progressive disc bulging asymmetric to the right with a right extraforaminal disc protrusion. New mild right neural foraminal stenosis. No definite L2 nerve impingement. No spinal stenosis.  L3-4:  Mild disc bulging without stenosis, unchanged.  L4-5: New minimal disc bulging and mild facet arthrosis without stenosis.  L5-S1: Listhesis, mild bulging of uncovered disc, and disc space height loss result in severe bilateral neural foraminal stenosis, not significantly changed. No spinal stenosis.  IMPRESSION: 1. Chronic L5 pars defects and anterolisthesis with unchanged  severe bilateral neural foraminal stenosis. 2. Progressive L2-3 disc degeneration with a new right extraforaminal disc protrusion. Mild right foraminal stenosis. 3. New small T11-12 disc protrusion without stenosis.   Electronically Signed   By: Logan Bores M.D.   On: 09/15/2016 18:25   She reports that she quit smoking about 18 years ago. Her smoking use included cigarettes. She has never used smokeless tobacco. No results for input(s): HGBA1C, LABURIC in the last 8760 hours.  Objective:  VS:  HT:    WT:   BMI:     BP:(!) 103/59  HR:75bpm  TEMP: ( )  RESP:  Physical Exam  Ortho Exam Imaging: No results found.  Past Medical/Family/Surgical/Social History: Medications & Allergies reviewed per EMR, new medications updated. Patient Active Problem List   Diagnosis Date Noted  . DDD (degenerative disc disease), lumbar 12/08/2016  . High risk medication use 03/15/2016  . DJD (degenerative joint disease), cervical 03/15/2016  . History of asthma 03/15/2016  . History of squamous cell carcinoma 03/15/2016  . History of basal cell carcinoma 03/15/2016  . History of breast cancer 03/15/2016  . History of Chiari malformation 03/15/2016  . Vitamin D deficiency 03/15/2016  . Rheumatoid arthritis with rheumatoid factor of multiple sites without organ or systems involvement ([place]) 10/20/2015  . Esophageal reflux 05/01/2015  . Anxiety 05/01/2015  . Sjogrens syndrome ([place]) 05/01/2015  . Chronic obstructive airway disease with asthma (  [place]) 05/01/2015  . Deviated nasal septum 05/24/2014    Class: Chronic  . Insomnia 11/25/2007  . DYSPNEA 11/15/2007   Past Medical History:  Diagnosis Date  . Asthma    inhaler one x per day  . Cancer [facility])    breast, left  . Chiari malformation   . PONV (postoperative nausea and vomiting)   . Rheumatoid arthritis (HCC)    on Humira   Family History  Problem Relation Age of Onset  . Diabetes Mother   . Heart failure Mother   . Bladder  Cancer Mother   . Stroke Mother   . Cancer Father        head and neck  . Uterine cancer Maternal Aunt   . Bladder Cancer Maternal Grandmother   . Colon cancer Maternal Grandmother    Past Surgical History:  Procedure Laterality Date  . BREAST SURGERY Left 2003   Lumpectomy T1C1, NO and negative ER/ PR  . COLONOSCOPY  10/04  . COLONOSCOPY  11/10   normal recheck in 5 years  . COLPORRHAPHY    . ESOPHAGOGASTRODUODENOSCOPY ENDOSCOPY  11/10   GERD  . FOOT SURGERY Left age 63   removal of pseudo rheumatoid nodules from left forefoot.  Marland Kitchen LAPAROSCOPIC BILATERAL SALPINGO OOPHERECTOMY N/A 07/30/2013   Procedure: LAPAROSCOPIC BILATERAL SALPINGO OOPHORECTOMY WITH WASHINGS;  Surgeon: Lyman Speller, MD;  Location: [facility];  Service: Gynecology;  Laterality: N/A;  . NASAL SEPTOPLASTY W/ TURBINOPLASTY Bilateral 05/24/2014   Procedure: NASAL SEPTOPLASTY WITH  BILATERAL TURBINATE REDUCTION;  Surgeon: Jerrell Belfast, MD;  Location: [facility];  Service: ENT;  Laterality: Bilateral;  . PELVIC FLOOR REPAIR  3/04   SPARC with AP colporrhaphy  . TOTAL VAGINAL HYSTERECTOMY  1989   with AP repair   Social History   Occupational History  . Not on file  Tobacco Use  . Smoking status: Former Smoker    Types: Cigarettes    Last attempt to quit: 02/16/1999    Years since quitting: 18.6  . Smokeless tobacco: Never Used  Substance and Sexual Activity  . Alcohol use: No  . Drug use: No  . Sexual activity: Not Currently    Partners: Male

## 2017-10-11 NOTE — Procedures (Signed)
Lumbar Facet Joint Intra-Articular Injection(s) with Fluoroscopic Guidance  [name]      Date of Birth: 10-04-1950 MRN: [id number] PCP: Kelton Pillar, MD      Visit Date: 10/03/2017   Universal Protocol:    Date/Time: 10/03/2017  Consent Given By: the patient  Position: PRONE   Additional Comments: Vital signs were monitored before and after the procedure. Patient was prepped and draped in the usual sterile fashion. The correct patient, procedure, and site was verified.   Injection Procedure Details:  Procedure Site One Meds Administered:  Meds ordered this encounter  Medications  . methylPREDNISolone acetate (DEPO-MEDROL) injection 80 mg     Laterality: Right  Location/Site:  L5-S1  Needle size: 22 guage  Needle type: Spinal  Needle Placement: Articular  Findings:  -Comments: Excellent flow of contrast producing a partial arthrogram.  Procedure Details: The fluoroscope beam is vertically oriented in AP, and the inferior recess is visualized beneath the lower pole of the inferior apophyseal process, which represents the target point for needle insertion. When direct visualization is difficult the target point is located at the medial projection of the vertebral pedicle. The region overlying each aforementioned target is locally anesthetized with a 1 to 2 ml. volume of 1% Lidocaine without Epinephrine.   The spinal needle was inserted into each of the above mentioned facet joints using biplanar fluoroscopic guidance. A 0.25 to 0.5 ml. volume of Isovue-250 was injected and a partial facet joint arthrogram was obtained. A single spot film was obtained of the resulting arthrogram.    One to 1.25 ml of the steroid/anesthetic solution was then injected into each of the facet joints noted above.   Additional Comments:  The patient tolerated the procedure well Dressing: Band-Aid    Post-procedure details: Patient was observed during the  procedure. Post-procedure instructions were reviewed.  Patient left the clinic in stable condition.

## 2017-10-12 DIAGNOSIS — M0519 Rheumatoid lung disease with rheumatoid arthritis of multiple sites: Secondary | ICD-10-CM | POA: Diagnosis not present

## 2017-10-19 ENCOUNTER — Encounter: Payer: Self-pay | Admitting: Rheumatology

## 2017-10-19 ENCOUNTER — Ambulatory Visit (INDEPENDENT_AMBULATORY_CARE_PROVIDER_SITE_OTHER): Payer: Medicare Other | Admitting: Rheumatology

## 2017-10-19 VITALS — BP 90/58 | HR 73 | Resp 15 | Ht 62.0 in | Wt 126.6 lb

## 2017-10-19 DIAGNOSIS — E559 Vitamin D deficiency, unspecified: Secondary | ICD-10-CM | POA: Diagnosis not present

## 2017-10-19 DIAGNOSIS — Z79899 Other long term (current) drug therapy: Secondary | ICD-10-CM | POA: Diagnosis not present

## 2017-10-19 DIAGNOSIS — F5101 Primary insomnia: Secondary | ICD-10-CM | POA: Diagnosis not present

## 2017-10-19 DIAGNOSIS — Z853 Personal history of malignant neoplasm of breast: Secondary | ICD-10-CM | POA: Diagnosis not present

## 2017-10-19 DIAGNOSIS — K219 Gastro-esophageal reflux disease without esophagitis: Secondary | ICD-10-CM | POA: Diagnosis not present

## 2017-10-19 DIAGNOSIS — M5030 Other cervical disc degeneration, unspecified cervical region: Secondary | ICD-10-CM

## 2017-10-19 DIAGNOSIS — M810 Age-related osteoporosis without current pathological fracture: Secondary | ICD-10-CM

## 2017-10-19 DIAGNOSIS — Z8709 Personal history of other diseases of the respiratory system: Secondary | ICD-10-CM | POA: Diagnosis not present

## 2017-10-19 DIAGNOSIS — Z8669 Personal history of other diseases of the nervous system and sense organs: Secondary | ICD-10-CM

## 2017-10-19 DIAGNOSIS — M5136 Other intervertebral disc degeneration, lumbar region: Secondary | ICD-10-CM

## 2017-10-19 DIAGNOSIS — Z8589 Personal history of malignant neoplasm of other organs and systems: Secondary | ICD-10-CM | POA: Diagnosis not present

## 2017-10-19 DIAGNOSIS — M0579 Rheumatoid arthritis with rheumatoid factor of multiple sites without organ or systems involvement: Secondary | ICD-10-CM

## 2017-10-21 LAB — QUANTIFERON-TB GOLD PLUS
Mitogen-NIL: >10 IU/mL
NIL: 0.02 IU/mL
QUANTIFERON-TB GOLD PLUS: NEGATIVE
TB1-NIL: 0 [IU]/mL
TB2-NIL: 0.01 [IU]/mL

## 2017-10-26 DIAGNOSIS — Z23 Encounter for immunization: Secondary | ICD-10-CM | POA: Diagnosis not present

## 2017-11-07 DIAGNOSIS — M069 Rheumatoid arthritis, unspecified: Secondary | ICD-10-CM | POA: Diagnosis not present

## 2017-11-07 DIAGNOSIS — M0519 Rheumatoid lung disease with rheumatoid arthritis of multiple sites: Secondary | ICD-10-CM | POA: Diagnosis not present

## 2017-11-07 NOTE — Addendum Note (Signed)
Addended by: Inis Sizer D on: 11/07/2017 09:48 AM   Modules accepted: Orders

## 2017-11-08 LAB — CBC WITH DIFFERENTIAL/PLATELET
BASOS: 1 %
Basophils Absolute: 0 10*3/uL (ref 0.0–0.2)
EOS (ABSOLUTE): 0.2 10*3/uL (ref 0.0–0.4)
Eos: 3 %
HEMOGLOBIN: 12.4 g/dL (ref 11.1–15.9)
Hematocrit: 37.3 % (ref 34.0–46.6)
IMMATURE GRANS (ABS): 0 10*3/uL (ref 0.0–0.1)
IMMATURE GRANULOCYTES: 0 %
LYMPHS: 22 %
Lymphocytes Absolute: 1.8 10*3/uL (ref 0.7–3.1)
MCH: 28.8 pg (ref 26.6–33.0)
MCHC: 33.2 g/dL (ref 31.5–35.7)
MCV: 87 fL (ref 79–97)
MONOCYTES: 7 %
Monocytes Absolute: 0.5 10*3/uL (ref 0.1–0.9)
NEUTROS ABS: 5.4 10*3/uL (ref 1.4–7.0)
NEUTROS PCT: 67 %
PLATELETS: 362 10*3/uL (ref 150–450)
RBC: 4.3 x10E6/uL (ref 3.77–5.28)
RDW: 13 % (ref 12.3–15.4)
WBC: 8 10*3/uL (ref 3.4–10.8)

## 2017-11-08 LAB — COMPREHENSIVE METABOLIC PANEL
A/G RATIO: 2.5 — AB (ref 1.2–2.2)
ALT: 15 IU/L (ref 0–32)
AST: 16 IU/L (ref 0–40)
Albumin: 4.5 g/dL (ref 3.6–4.8)
Alkaline Phosphatase: 68 IU/L (ref 39–117)
BUN/Creatinine Ratio: 18 (ref 12–28)
BUN: 10 mg/dL (ref 8–27)
Bilirubin Total: 0.4 mg/dL (ref 0.0–1.2)
CALCIUM: 8.9 mg/dL (ref 8.7–10.3)
CO2: 21 mmol/L (ref 20–29)
CREATININE: 0.55 mg/dL — AB (ref 0.57–1.00)
Chloride: 103 mmol/L (ref 96–106)
GFR, EST AFRICAN AMERICAN: 113 mL/min/{1.73_m2} (ref >59)
GFR, EST NON AFRICAN AMERICAN: 98 mL/min/{1.73_m2} (ref >59)
GLUCOSE: 89 mg/dL (ref 65–99)
Globulin, Total: 1.8 g/dL (ref 1.5–4.5)
POTASSIUM: 4.1 mmol/L (ref 3.5–5.2)
Sodium: 138 mmol/L (ref 134–144)
TOTAL PROTEIN: 6.3 g/dL (ref 6.0–8.5)

## 2017-11-09 ENCOUNTER — Telehealth: Payer: Self-pay | Admitting: *Deleted

## 2017-11-09 NOTE — Telephone Encounter (Signed)
-----   Message from Britt Bottom, MD sent at 11/08/2017  7:00 PM EDT ----- Please let the patient know that the lab work is fine.

## 2017-11-09 NOTE — Telephone Encounter (Signed)
Spoke with Kristy Perez and reviewed below lab results. She verbalized understanding of same. Per her request, I have faxed lab results to her at [phone]

## 2017-11-21 ENCOUNTER — Telehealth (INDEPENDENT_AMBULATORY_CARE_PROVIDER_SITE_OTHER): Payer: Self-pay | Admitting: Physical Medicine and Rehabilitation

## 2017-11-21 NOTE — Telephone Encounter (Signed)
ov

## 2017-11-21 NOTE — Telephone Encounter (Signed)
Scheduled for 10/22 at 0930.

## 2017-11-25 ENCOUNTER — Telehealth (INDEPENDENT_AMBULATORY_CARE_PROVIDER_SITE_OTHER): Payer: Self-pay | Admitting: Orthopaedic Surgery

## 2017-11-25 NOTE — Telephone Encounter (Signed)
2018 xry reports & MR report faxed to [facility], ph [phone]

## 2017-11-30 DIAGNOSIS — Z1231 Encounter for screening mammogram for malignant neoplasm of breast: Secondary | ICD-10-CM | POA: Diagnosis not present

## 2017-11-30 DIAGNOSIS — Z853 Personal history of malignant neoplasm of breast: Secondary | ICD-10-CM | POA: Diagnosis not present

## 2017-12-05 DIAGNOSIS — M0519 Rheumatoid lung disease with rheumatoid arthritis of multiple sites: Secondary | ICD-10-CM | POA: Diagnosis not present

## 2017-12-06 ENCOUNTER — Ambulatory Visit (INDEPENDENT_AMBULATORY_CARE_PROVIDER_SITE_OTHER): Payer: Medicare Other | Admitting: Physical Medicine and Rehabilitation

## 2017-12-06 ENCOUNTER — Encounter (INDEPENDENT_AMBULATORY_CARE_PROVIDER_SITE_OTHER): Payer: Self-pay | Admitting: Physical Medicine and Rehabilitation

## 2017-12-06 VITALS — BP 122/66 | HR 86 | Ht 62.0 in | Wt 125.0 lb

## 2017-12-06 DIAGNOSIS — M4316 Spondylolisthesis, lumbar region: Secondary | ICD-10-CM | POA: Diagnosis not present

## 2017-12-06 DIAGNOSIS — Q762 Congenital spondylolisthesis: Secondary | ICD-10-CM | POA: Diagnosis not present

## 2017-12-06 DIAGNOSIS — M5116 Intervertebral disc disorders with radiculopathy, lumbar region: Secondary | ICD-10-CM | POA: Diagnosis not present

## 2017-12-06 DIAGNOSIS — M5416 Radiculopathy, lumbar region: Secondary | ICD-10-CM | POA: Diagnosis not present

## 2017-12-06 NOTE — Progress Notes (Signed)
.  Numeric Pain Rating Scale and Functional Assessment Average Pain 4 Pain Right Now 5 My pain is intermittent, constant, dull and aching Pain is worse with: sitting and standing Pain improves with: therapy/exercise   In the last MONTH (on 0-10 scale) has pain interfered with the following?  1. General activity like being  able to carry out your everyday physical activities such as walking, climbing stairs, carrying groceries, or moving a chair?  Rating(5)  2. Relation with others like being able to carry out your usual social activities and roles such as  activities at home, at work and in your community. Rating(4)  3. Enjoyment of life such that you have  been bothered by emotional problems such as feeling anxious, depressed or irritable?  Rating(2)

## 2017-12-07 ENCOUNTER — Encounter (INDEPENDENT_AMBULATORY_CARE_PROVIDER_SITE_OTHER): Payer: Self-pay | Admitting: Physical Medicine and Rehabilitation

## 2017-12-07 ENCOUNTER — Ambulatory Visit (INDEPENDENT_AMBULATORY_CARE_PROVIDER_SITE_OTHER): Payer: Medicare Other | Admitting: Physical Medicine and Rehabilitation

## 2017-12-07 ENCOUNTER — Ambulatory Visit (INDEPENDENT_AMBULATORY_CARE_PROVIDER_SITE_OTHER): Payer: Self-pay

## 2017-12-07 VITALS — BP 98/64 | HR 77 | Temp 97.6°F

## 2017-12-07 DIAGNOSIS — M5416 Radiculopathy, lumbar region: Secondary | ICD-10-CM

## 2017-12-07 DIAGNOSIS — M5116 Intervertebral disc disorders with radiculopathy, lumbar region: Secondary | ICD-10-CM

## 2017-12-07 DIAGNOSIS — M4316 Spondylolisthesis, lumbar region: Secondary | ICD-10-CM | POA: Insufficient documentation

## 2017-12-07 DIAGNOSIS — Q762 Congenital spondylolisthesis: Secondary | ICD-10-CM | POA: Insufficient documentation

## 2017-12-07 MED ORDER — NORTRIPTYLINE HCL 10 MG PO CAPS: 10.0000 mg | ORAL_CAPSULE | Freq: Every day | ORAL | 0 refills | Status: DC

## 2017-12-07 MED ORDER — BETAMETHASONE SOD PHOS & ACET 6 (3-3) MG/ML IJ SUSP
12.0000 mg | Freq: Once | INTRAMUSCULAR | Status: AC
  Administered 2017-12-07: 12 mg

## 2017-12-07 NOTE — Progress Notes (Signed)
Kristy Perez - 67 y.o. female MRN [id number]  Date of birth: 15-Aug-1950  Office Visit Note: Visit Date: 12/06/2017 PCP: Kelton Pillar, MD Referred by: Kelton Pillar, MD  Subjective: Chief Complaint  Patient presents with  . Lower Back - Pain  . Right Hip - Pain  . Right Thigh - Pain   HPI: Kristy Perez is a 67 y.o. female who comes in today For evaluation and management of her chronic right-sided low back pain and hip and groin pain.  I last saw her in August and completed an L2 transforaminal injection on the right with good relief for about 2 months.  Prior injection at the same level at least on one occasion gave her several months of relief that we really have not been able to get back to that point.  She reports excruciating pain predominantly with prolonged standing somewhat with prolonged sitting.  The best relief she gets is with taking weight off of her leg and stretching on the left side.  She has pain anteriorly into the upper part of the thigh and groin.  She is been thoroughly evaluated from orthopedics from a hip standpoint and only has mild degenerative joint disease.  Exam has been consistently not painful with rotation.  Diagnostically she has gotten good relief with the L2 transforaminal injection.  Her case is complicated by rheumatoid arthritis and allergy to iodine in terms of doing contrast injection but we have been able to use Omniscan with good resolution of the fluoroscopic injection.  She denies any left-sided complaints.  MRI findings are reviewed with her once again.  She has a pars defect at L5-S1 she has no pain down the backs of the legs or sides of the legs.  MRI does show lateral disc herniation at L2-3 on the right.  She reports her pain is a constant dull tooth achy pain and she is had no new injury.  She reports starting acupuncture a few weeks ago and seems like that helped a little.  She has had therapy in the past she is very intolerant of opioid  medications she has tried other medications.  She has not tried gabapentin.  She has not had any back surgeries.  She has had no electrodiagnostic study.  Review of Systems  Constitutional: Negative for chills, fever, malaise/fatigue and weight loss.  HENT: Negative for hearing loss and sinus pain.   Eyes: Negative for blurred vision, double vision and photophobia.  Respiratory: Negative for cough and shortness of breath.   Cardiovascular: Negative for chest pain, palpitations and leg swelling.  Gastrointestinal: Negative for abdominal pain, nausea and vomiting.  Genitourinary: Negative for flank pain.  Musculoskeletal: Positive for back pain and joint pain. Negative for myalgias.       Radicular hip pain  Skin: Negative for itching and rash.  Neurological: Negative for tremors, focal weakness and weakness.  Endo/Heme/Allergies: Negative.   Psychiatric/Behavioral: Negative for depression.  All other systems reviewed and are negative.  Otherwise per HPI.  Assessment & Plan: Visit Diagnoses:  1. Radiculopathy due to lumbar intervertebral disc disorder   2. Lumbar radiculopathy   3. Congenital spondylolysis   4. Spondylolisthesis of lumbar region     Plan: Findings:  Chronic recalcitrant recurring right hip and groin pain consistent with L2 lateral foraminal disc herniation.  Hip exam is nonfocal and she has excellent range of motion of her hip with only mild degenerative changes on exam and on x-ray.  She was previously seen  by Dr. Joni Fears who determined it was not hip related.  Injection has been diagnostic from a transforaminal approach at L2.  She is getting about 2 months of relief and then the symptoms recur.  She is not very tolerant of medications.  I am going to start her on nortriptyline at night.  I am going to repeat the injection one time to get her some relief through a trip that she is having she is to have to sit and while playing soon.  We are going to refer her to  Dr. Sherley Bounds for at least input on surgical consideration.  Patient is in a lot of discomfort which is obvious in the room today.  We also discussed spinal cord stimulator trial and gave her some information on this as well.    Meds & Orders:  Meds ordered this encounter  Medications  . nortriptyline (PAMELOR) 10 MG capsule    Sig: Take 1 capsule (10 mg total) by mouth at bedtime.    Dispense:  90 capsule    Refill:  0    Orders Placed This Encounter  Procedures  . Ambulatory referral to Neurosurgery    Follow-up: Return for Repeat right L2 transforaminal dural steroid injection.   Procedures: No procedures performed  No notes on file   Clinical History: MRI LUMBAR SPINE WITHOUT CONTRAST  TECHNIQUE: Multiplanar, multisequence MR imaging of the lumbar spine was performed. No intravenous contrast was administered.  COMPARISON:  08/22/2007  FINDINGS: Segmentation:  Standard.  Alignment: Chronic bilateral L5 pars defects with 8 mm anterolisthesis of L5 on S1, not significantly changed.  Vertebrae: No fracture, suspicious osseous lesion, or significant marrow edema. Multiple small Schmorl's nodes are again seen scattered throughout the lumbar and lower thoracic spine.  Conus medullaris: Extends to the L1 level and appears normal.  Paraspinal and other soft tissues: Unremarkable.  Disc levels:  Disc desiccation throughout the lumbar spine. Severe disc space narrowing at L5-S1 with mild narrowing at L2-3.  T10-11:  Negative.  T11-12:  New small left central disc protrusion without stenosis.  T12-L1:  Negative.  L1-2:  At most minimal disc bulging without stenosis, unchanged.  L2-3: Progressive disc bulging asymmetric to the right with a right extraforaminal disc protrusion. New mild right neural foraminal stenosis. No definite L2 nerve impingement. No spinal stenosis.  L3-4:  Mild disc bulging without stenosis, unchanged.  L4-5: New minimal  disc bulging and mild facet arthrosis without stenosis.  L5-S1: Listhesis, mild bulging of uncovered disc, and disc space height loss result in severe bilateral neural foraminal stenosis, not significantly changed. No spinal stenosis.  IMPRESSION: 1. Chronic L5 pars defects and anterolisthesis with unchanged severe bilateral neural foraminal stenosis. 2. Progressive L2-3 disc degeneration with a new right extraforaminal disc protrusion. Mild right foraminal stenosis. 3. New small T11-12 disc protrusion without stenosis.   Electronically Signed   By: Logan Bores M.D.   On: 09/15/2016 18:25   She reports that she quit smoking about 18 years ago. Her smoking use included cigarettes. She has never used smokeless tobacco. No results for input(s): HGBA1C, LABURIC in the last 8760 hours.  Objective:  VS:  HT:5\' 2"  (157.5 cm)   WT:125 lb (56.7 kg)  BMI:22.86    BP:122/66  HR:86bpm  TEMP: ( )  RESP:97 % Physical Exam  Constitutional: She is oriented to person, place, and time. She appears well-developed and well-nourished. No distress.  HENT:  Head: Normocephalic and atraumatic.  Nose: Nose normal.  Mouth/Throat: Oropharynx is clear and moist.  Eyes: Pupils are equal, round, and reactive to light. Conjunctivae are normal.  Neck: Normal range of motion. Neck supple.  Cardiovascular: Regular rhythm and intact distal pulses.  Pulmonary/Chest: Effort normal. No respiratory distress.  Abdominal: She exhibits no distension. There is no guarding.  Musculoskeletal:  Patient ambulates without aid.  She is very uncomfortable in the room and tries to find different positions to alleviate her hip and thigh pain.  She gets some pain with bending forward but then will have to change positions and she gets some pain with distraction and letting the hip and lumbar spine become less weightbearing.  She has a negative slump test bilaterally she has good strength in the extremities bilaterally and  symmetric side to side.  No pain with hip rotation at all with full range of motion.  No pain over the greater trochanters.  No clonus.  Neurological: She is alert and oriented to person, place, and time. She exhibits normal muscle tone. Coordination normal.  Skin: Skin is warm. No rash noted. No erythema.  Psychiatric: She has a normal mood and affect. Her behavior is normal.  Nursing note and vitals reviewed.   Ortho Exam Imaging: No results found.  Past Medical/Family/Surgical/Social History: Medications & Allergies reviewed per EMR, new medications updated. Patient Active Problem List   Diagnosis Date Noted  . Radiculopathy due to lumbar intervertebral disc disorder 12/07/2017  . Congenital spondylolysis 12/07/2017  . Spondylolisthesis of lumbar region 12/07/2017  . DDD (degenerative disc disease), lumbar 12/08/2016  . High risk medication use 03/15/2016  . DJD (degenerative joint disease), cervical 03/15/2016  . History of asthma 03/15/2016  . History of squamous cell carcinoma 03/15/2016  . History of basal cell carcinoma 03/15/2016  . History of breast cancer 03/15/2016  . History of Chiari malformation 03/15/2016  . Vitamin D deficiency 03/15/2016  . Rheumatoid arthritis with rheumatoid factor of multiple sites without organ or systems involvement ([place]) 10/20/2015  . Esophageal reflux 05/01/2015  . Anxiety 05/01/2015  . Sjogrens syndrome ([place]) 05/01/2015  . Chronic obstructive airway disease with asthma ([place]) 05/01/2015  . Deviated nasal septum 05/24/2014    Class: Chronic  . Insomnia 11/25/2007  . DYSPNEA 11/15/2007   Past Medical History:  Diagnosis Date  . Asthma    inhaler one x per day  . Cancer [facility])    breast, left  . Chiari malformation   . PONV (postoperative nausea and vomiting)   . Rheumatoid arthritis (HCC)    on Humira   Family History  Problem Relation Age of Onset  . Diabetes Mother   . Heart failure Mother   . Bladder Cancer Mother   .  Stroke Mother   . Cancer Father        head and neck  . Uterine cancer Maternal Aunt   . Bladder Cancer Maternal Grandmother   . Colon cancer Maternal Grandmother    Past Surgical History:  Procedure Laterality Date  . BREAST SURGERY Left 2003   Lumpectomy T1C1, NO and negative ER/ PR  . COLONOSCOPY  10/04  . COLONOSCOPY  11/10   normal recheck in 5 years  . COLPORRHAPHY    . ESOPHAGOGASTRODUODENOSCOPY ENDOSCOPY  11/10   GERD  . FOOT SURGERY Left age 69   removal of pseudo rheumatoid nodules from left forefoot.  Marland Kitchen LAPAROSCOPIC BILATERAL SALPINGO OOPHERECTOMY N/A 07/30/2013   Procedure: LAPAROSCOPIC BILATERAL SALPINGO OOPHORECTOMY WITH WASHINGS;  Surgeon: Lyman Speller, MD;  Location: [facility];  Service: Gynecology;  Laterality: N/A;  . NASAL SEPTOPLASTY W/ TURBINOPLASTY Bilateral 05/24/2014   Procedure: NASAL SEPTOPLASTY WITH  BILATERAL TURBINATE REDUCTION;  Surgeon: Jerrell Belfast, MD;  Location: [facility];  Service: ENT;  Laterality: Bilateral;  . PELVIC FLOOR REPAIR  3/04   SPARC with AP colporrhaphy  . TOTAL VAGINAL HYSTERECTOMY  1989   with AP repair   Social History   Occupational History  . Not on file  Tobacco Use  . Smoking status: Former Smoker    Types: Cigarettes    Last attempt to quit: 02/16/1999    Years since quitting: 18.8  . Smokeless tobacco: Never Used  Substance and Sexual Activity  . Alcohol use: No  . Drug use: No  . Sexual activity: Not Currently    Partners: Male

## 2017-12-07 NOTE — Patient Instructions (Signed)

## 2017-12-07 NOTE — Progress Notes (Signed)
 .  Numeric Pain Rating Scale and Functional Assessment Average Pain 5   In the last MONTH (on 0-10 scale) has pain interfered with the following?  1. General activity like being  able to carry out your everyday physical activities such as walking, climbing stairs, carrying groceries, or moving a chair?  Rating(5)   +Driver, -BT, +Dye Allergies(Iodine).

## 2017-12-14 NOTE — Progress Notes (Signed)
Kristy Perez - 67 y.o. female MRN [id number]  Date of birth: 09/23/1950  Office Visit Note: Visit Date: 12/07/2017 PCP: Kelton Pillar, MD Referred by: Kelton Pillar, MD  Subjective: Chief Complaint  Patient presents with  . Lower Back - Pain   HPI:  Kristy Perez is a 67 y.o. female who comes in today For planned right L2 transforaminal epidural steroid injection.  Please see our prior evaluation and management note for further details and justification.  As noted in the procedure note the patient actually reported significant pain relief during the anesthetic phase post injection.  ROS Otherwise per HPI.  Assessment & Plan: Visit Diagnoses:  1. Lumbar radiculopathy   2. Radiculopathy due to lumbar intervertebral disc disorder     Plan: No additional findings.   Meds & Orders:  Meds ordered this encounter  Medications  . betamethasone acetate-betamethasone sodium phosphate (CELESTONE) injection 12 mg    Orders Placed This Encounter  Procedures  . XR C-ARM NO REPORT  . Epidural Steroid injection    Follow-up: Return if symptoms worsen or fail to improve.   Procedures: No procedures performed  Lumbosacral Transforaminal Epidural Steroid Injection - Sub-Pedicular Approach with Fluoroscopic Guidance  [name]      Date of Birth: 10-07-1950 MRN: [id number] PCP: Kelton Pillar, MD      Visit Date: 12/07/2017   Universal Protocol:    Date/Time: 12/07/2017  Consent Given By: the patient  Position: PRONE  Additional Comments: Vital signs were monitored before and after the procedure. Patient was prepped and draped in the usual sterile fashion. The correct patient, procedure, and site was verified.   Injection Procedure Details:  Procedure Site One Meds Administered:  Meds ordered this encounter  Medications  . betamethasone acetate-betamethasone sodium phosphate (CELESTONE) injection 12 mg    Laterality: Right  Location/Site:   L2-L3  Needle size: 22 G  Needle type: Spinal  Needle Placement: Transforaminal  Findings:    -Comments: Excellent flow of contrast along the nerve and into the epidural space.  Patient, and on pain relief during the anesthetic phase.  Procedure Details: After squaring off the end-plates to get a true AP view, the C-arm was positioned so that an oblique view of the foramen as noted above was visualized. The target area is just inferior to the "nose of the scotty dog" or sub pedicular. The soft tissues overlying this structure were infiltrated with 2-3 ml. of 1% Lidocaine without Epinephrine.  The spinal needle was inserted toward the target using a "trajectory" view along the fluoroscope beam.  Under AP and lateral visualization, the needle was advanced so it did not puncture dura and was located close the 6 O'Clock position of the pedical in AP tracterory. Biplanar projections were used to confirm position. Aspiration was confirmed to be negative for CSF and/or blood. A 1-2 ml. volume of Isovue-250 was injected and flow of contrast was noted at each level. Radiographs were obtained for documentation purposes.   After attaining the desired flow of contrast documented above, a 0.5 to 1.0 ml test dose of 0.25% Marcaine was injected into each respective transforaminal space.  The patient was observed for 90 seconds post injection.  After no sensory deficits were reported, and normal lower extremity motor function was noted,   the above injectate was administered so that equal amounts of the injectate were placed at each foramen (level) into the transforaminal epidural space.   Additional Comments:  The patient tolerated the  procedure well Dressing: Band-Aid    Post-procedure details: Patient was observed during the procedure. Post-procedure instructions were reviewed.  Patient left the clinic in stable condition.    Clinical History: MRI LUMBAR SPINE WITHOUT  CONTRAST  TECHNIQUE: Multiplanar, multisequence MR imaging of the lumbar spine was performed. No intravenous contrast was administered.  COMPARISON:  08/22/2007  FINDINGS: Segmentation:  Standard.  Alignment: Chronic bilateral L5 pars defects with 8 mm anterolisthesis of L5 on S1, not significantly changed.  Vertebrae: No fracture, suspicious osseous lesion, or significant marrow edema. Multiple small Schmorl's nodes are again seen scattered throughout the lumbar and lower thoracic spine.  Conus medullaris: Extends to the L1 level and appears normal.  Paraspinal and other soft tissues: Unremarkable.  Disc levels:  Disc desiccation throughout the lumbar spine. Severe disc space narrowing at L5-S1 with mild narrowing at L2-3.  T10-11:  Negative.  T11-12:  New small left central disc protrusion without stenosis.  T12-L1:  Negative.  L1-2:  At most minimal disc bulging without stenosis, unchanged.  L2-3: Progressive disc bulging asymmetric to the right with a right extraforaminal disc protrusion. New mild right neural foraminal stenosis. No definite L2 nerve impingement. No spinal stenosis.  L3-4:  Mild disc bulging without stenosis, unchanged.  L4-5: New minimal disc bulging and mild facet arthrosis without stenosis.  L5-S1: Listhesis, mild bulging of uncovered disc, and disc space height loss result in severe bilateral neural foraminal stenosis, not significantly changed. No spinal stenosis.  IMPRESSION: 1. Chronic L5 pars defects and anterolisthesis with unchanged severe bilateral neural foraminal stenosis. 2. Progressive L2-3 disc degeneration with a new right extraforaminal disc protrusion. Mild right foraminal stenosis. 3. New small T11-12 disc protrusion without stenosis.   Electronically Signed   By: Logan Bores M.D.   On: 09/15/2016 18:25     Objective:  VS:  HT:    WT:   BMI:     BP:98/64  HR:77bpm  TEMP:97.6 F (36.4  C)(Oral)  RESP:  Physical Exam  Ortho Exam Imaging: No results found.

## 2017-12-14 NOTE — Procedures (Signed)
Lumbosacral Transforaminal Epidural Steroid Injection - Sub-Pedicular Approach with Fluoroscopic Guidance  [name]      Date of Birth: 01-17-1951 MRN: [id number] PCP: Kelton Pillar, MD      Visit Date: 12/07/2017   Universal Protocol:    Date/Time: 12/07/2017  Consent Given By: the patient  Position: PRONE  Additional Comments: Vital signs were monitored before and after the procedure. Patient was prepped and draped in the usual sterile fashion. The correct patient, procedure, and site was verified.   Injection Procedure Details:  Procedure Site One Meds Administered:  Meds ordered this encounter  Medications  . betamethasone acetate-betamethasone sodium phosphate (CELESTONE) injection 12 mg    Laterality: Right  Location/Site:  L2-L3  Needle size: 22 G  Needle type: Spinal  Needle Placement: Transforaminal  Findings:    -Comments: Excellent flow of contrast along the nerve and into the epidural space.  Patient, and on pain relief during the anesthetic phase.  Procedure Details: After squaring off the end-plates to get a true AP view, the C-arm was positioned so that an oblique view of the foramen as noted above was visualized. The target area is just inferior to the "nose of the scotty dog" or sub pedicular. The soft tissues overlying this structure were infiltrated with 2-3 ml. of 1% Lidocaine without Epinephrine.  The spinal needle was inserted toward the target using a "trajectory" view along the fluoroscope beam.  Under AP and lateral visualization, the needle was advanced so it did not puncture dura and was located close the 6 O'Clock position of the pedical in AP tracterory. Biplanar projections were used to confirm position. Aspiration was confirmed to be negative for CSF and/or blood. A 1-2 ml. volume of Isovue-250 was injected and flow of contrast was noted at each level. Radiographs were obtained for documentation purposes.   After attaining  the desired flow of contrast documented above, a 0.5 to 1.0 ml test dose of 0.25% Marcaine was injected into each respective transforaminal space.  The patient was observed for 90 seconds post injection.  After no sensory deficits were reported, and normal lower extremity motor function was noted,   the above injectate was administered so that equal amounts of the injectate were placed at each foramen (level) into the transforaminal epidural space.   Additional Comments:  The patient tolerated the procedure well Dressing: Band-Aid    Post-procedure details: Patient was observed during the procedure. Post-procedure instructions were reviewed.  Patient left the clinic in stable condition.

## 2017-12-15 DIAGNOSIS — F988 Other specified behavioral and emotional disorders with onset usually occurring in childhood and adolescence: Secondary | ICD-10-CM | POA: Diagnosis not present

## 2017-12-15 DIAGNOSIS — Z0000 Encounter for general adult medical examination without abnormal findings: Secondary | ICD-10-CM | POA: Diagnosis not present

## 2017-12-15 DIAGNOSIS — G4701 Insomnia due to medical condition: Secondary | ICD-10-CM | POA: Diagnosis not present

## 2017-12-15 DIAGNOSIS — R1110 Vomiting, unspecified: Secondary | ICD-10-CM | POA: Diagnosis not present

## 2017-12-15 DIAGNOSIS — K649 Unspecified hemorrhoids: Secondary | ICD-10-CM | POA: Diagnosis not present

## 2017-12-15 DIAGNOSIS — J45909 Unspecified asthma, uncomplicated: Secondary | ICD-10-CM | POA: Diagnosis not present

## 2017-12-15 DIAGNOSIS — Z853 Personal history of malignant neoplasm of breast: Secondary | ICD-10-CM | POA: Diagnosis not present

## 2017-12-15 DIAGNOSIS — K219 Gastro-esophageal reflux disease without esophagitis: Secondary | ICD-10-CM | POA: Diagnosis not present

## 2017-12-15 DIAGNOSIS — M810 Age-related osteoporosis without current pathological fracture: Secondary | ICD-10-CM | POA: Diagnosis not present

## 2017-12-15 DIAGNOSIS — M069 Rheumatoid arthritis, unspecified: Secondary | ICD-10-CM | POA: Diagnosis not present

## 2017-12-19 DIAGNOSIS — M545 Low back pain: Secondary | ICD-10-CM | POA: Diagnosis not present

## 2018-01-02 DIAGNOSIS — L814 Other melanin hyperpigmentation: Secondary | ICD-10-CM | POA: Diagnosis not present

## 2018-01-02 DIAGNOSIS — Z23 Encounter for immunization: Secondary | ICD-10-CM | POA: Diagnosis not present

## 2018-01-02 DIAGNOSIS — L821 Other seborrheic keratosis: Secondary | ICD-10-CM | POA: Diagnosis not present

## 2018-01-02 DIAGNOSIS — L570 Actinic keratosis: Secondary | ICD-10-CM | POA: Diagnosis not present

## 2018-01-02 DIAGNOSIS — D485 Neoplasm of uncertain behavior of skin: Secondary | ICD-10-CM | POA: Diagnosis not present

## 2018-01-02 DIAGNOSIS — D225 Melanocytic nevi of trunk: Secondary | ICD-10-CM | POA: Diagnosis not present

## 2018-01-02 DIAGNOSIS — Z85828 Personal history of other malignant neoplasm of skin: Secondary | ICD-10-CM | POA: Diagnosis not present

## 2018-01-02 DIAGNOSIS — L820 Inflamed seborrheic keratosis: Secondary | ICD-10-CM | POA: Diagnosis not present

## 2018-01-03 DIAGNOSIS — M069 Rheumatoid arthritis, unspecified: Secondary | ICD-10-CM | POA: Diagnosis not present

## 2018-01-03 DIAGNOSIS — M0519 Rheumatoid lung disease with rheumatoid arthritis of multiple sites: Secondary | ICD-10-CM | POA: Diagnosis not present

## 2018-01-03 NOTE — Addendum Note (Signed)
Addended by: Inis Sizer D on: 01/03/2018 10:08 AM   Modules accepted: Orders

## 2018-01-04 DIAGNOSIS — M545 Low back pain: Secondary | ICD-10-CM | POA: Diagnosis not present

## 2018-01-04 DIAGNOSIS — M5126 Other intervertebral disc displacement, lumbar region: Secondary | ICD-10-CM | POA: Diagnosis not present

## 2018-01-04 LAB — COMPREHENSIVE METABOLIC PANEL
A/G RATIO: 2.4 — AB (ref 1.2–2.2)
ALBUMIN: 4.5 g/dL (ref 3.6–4.8)
ALT: 15 IU/L (ref 0–32)
AST: 17 IU/L (ref 0–40)
Alkaline Phosphatase: 71 IU/L (ref 39–117)
BILIRUBIN TOTAL: 0.4 mg/dL (ref 0.0–1.2)
BUN / CREAT RATIO: 27 (ref 12–28)
BUN: 13 mg/dL (ref 8–27)
CHLORIDE: 100 mmol/L (ref 96–106)
CO2: 22 mmol/L (ref 20–29)
Calcium: 9 mg/dL (ref 8.7–10.3)
Creatinine, Ser: 0.48 mg/dL — ABNORMAL LOW (ref 0.57–1.00)
GFR calc Af Amer: 118 mL/min/{1.73_m2} (ref >59)
GFR, EST NON AFRICAN AMERICAN: 103 mL/min/{1.73_m2} (ref >59)
GLOBULIN, TOTAL: 1.9 g/dL (ref 1.5–4.5)
GLUCOSE: 75 mg/dL (ref 65–99)
POTASSIUM: 4.1 mmol/L (ref 3.5–5.2)
Sodium: 138 mmol/L (ref 134–144)
TOTAL PROTEIN: 6.4 g/dL (ref 6.0–8.5)

## 2018-01-04 LAB — CBC WITH DIFFERENTIAL/PLATELET
BASOS ABS: 0 10*3/uL (ref 0.0–0.2)
BASOS: 1 %
EOS (ABSOLUTE): 0.4 10*3/uL (ref 0.0–0.4)
Eos: 6 %
HEMOGLOBIN: 12.6 g/dL (ref 11.1–15.9)
Hematocrit: 37.2 % (ref 34.0–46.6)
IMMATURE GRANS (ABS): 0 10*3/uL (ref 0.0–0.1)
Immature Granulocytes: 0 %
LYMPHS ABS: 2.1 10*3/uL (ref 0.7–3.1)
LYMPHS: 31 %
MCH: 29 pg (ref 26.6–33.0)
MCHC: 33.9 g/dL (ref 31.5–35.7)
MCV: 86 fL (ref 79–97)
Monocytes Absolute: 0.6 10*3/uL (ref 0.1–0.9)
Monocytes: 9 %
Neutrophils Absolute: 3.6 10*3/uL (ref 1.4–7.0)
Neutrophils: 53 %
PLATELETS: 393 10*3/uL (ref 150–450)
RBC: 4.35 x10E6/uL (ref 3.77–5.28)
RDW: 12.8 % (ref 12.3–15.4)
WBC: 6.7 10*3/uL (ref 3.4–10.8)

## 2018-01-05 ENCOUNTER — Telehealth: Payer: Self-pay | Admitting: *Deleted

## 2018-01-05 NOTE — Telephone Encounter (Signed)
Called and spoke with pt about lab results. She verbalized understanding. She would like results faxed to her at [phone].

## 2018-01-05 NOTE — Telephone Encounter (Signed)
-----   Message from Britt Bottom, MD sent at 01/04/2018  4:58 PM EST ----- Please let the patient know that the lab work is fine.

## 2018-01-23 ENCOUNTER — Other Ambulatory Visit: Payer: Self-pay | Admitting: Neurological Surgery

## 2018-01-23 DIAGNOSIS — M545 Low back pain: Secondary | ICD-10-CM | POA: Diagnosis not present

## 2018-01-23 DIAGNOSIS — M5126 Other intervertebral disc displacement, lumbar region: Secondary | ICD-10-CM | POA: Diagnosis not present

## 2018-01-31 DIAGNOSIS — M0519 Rheumatoid lung disease with rheumatoid arthritis of multiple sites: Secondary | ICD-10-CM | POA: Diagnosis not present

## 2018-03-07 DIAGNOSIS — M069 Rheumatoid arthritis, unspecified: Secondary | ICD-10-CM | POA: Diagnosis not present

## 2018-03-07 DIAGNOSIS — M0519 Rheumatoid lung disease with rheumatoid arthritis of multiple sites: Secondary | ICD-10-CM | POA: Diagnosis not present

## 2018-03-07 NOTE — Addendum Note (Signed)
Addended by: Inis Sizer D on: 03/07/2018 10:37 AM   Modules accepted: Orders

## 2018-03-07 NOTE — Progress Notes (Signed)
Office Visit Note  [name]             Date of Birth: 01-26-1951           MRN: [id number]             PCP: Kelton Pillar, MD Referring: Kelton Pillar, MD Visit Date: 03/21/2018 Occupation: @GUAROCC @  Subjective:  Lower back pain   History of Present Illness: Kristy Perez is a 68 y.o. female with history of seropositive rheumatoid arthritis and DDD.  She is on Orencia IV infusions every 4 weeks. She is due for her next infusion on 04/04/18. She continues to have chronic lower back pain and radiculopathy. She is scheduled for surgery with Dr. Ronnald Ramp on 04/06/18, so she will be postponing the next infusion.  She states her RA has been well controlled and she has not had any recent flares.  She states she typically has increased fatigue and breakthrough pain 1 week prior to infusions.  She states she has chronic pain in both feet.  She continues to work and walks about 11,000 steps daily which increases the discomfort in both feet and ankle joints.  She denies any joint swelling.  She has bilateral CMC joint pain.  She denies any neck pain at this time.    Activities of Daily Living:  Patient reports morning stiffness for 0 minutes.   Patient Reports nocturnal pain.  Difficulty dressing/grooming: Denies Difficulty climbing stairs: Denies Difficulty getting out of chair: Reports Difficulty using hands for taps, buttons, cutlery, and/or writing: Reports  Review of Systems  Constitutional: Positive for fatigue.  HENT: Positive for mouth dryness and nose dryness. Negative for mouth sores.   Eyes: Positive for dryness. Negative for pain and visual disturbance.  Respiratory: Negative for cough, hemoptysis, shortness of breath and difficulty breathing.   Cardiovascular: Negative for chest pain, palpitations, hypertension and swelling in legs/feet.  Gastrointestinal: Positive for constipation. Negative for blood in stool and diarrhea.  Endocrine: Negative for increased  urination.  Genitourinary: Negative for painful urination.  Musculoskeletal: Positive for arthralgias and joint pain. Negative for joint swelling, myalgias, muscle weakness, morning stiffness, muscle tenderness and myalgias.  Skin: Negative for color change, pallor, rash, hair loss, nodules/bumps, skin tightness, ulcers and sensitivity to sunlight.  Allergic/Immunologic: Negative for susceptible to infections.  Neurological: Negative for numbness, headaches and weakness.  Hematological: Negative for swollen glands.  Psychiatric/Behavioral: Negative for depressed mood and sleep disturbance. The patient is not nervous/anxious.     PMFS History:  Patient Active Problem List   Diagnosis Date Noted  . Radiculopathy due to lumbar intervertebral disc disorder 12/07/2017  . Congenital spondylolysis 12/07/2017  . Spondylolisthesis of lumbar region 12/07/2017  . DDD (degenerative disc disease), lumbar 12/08/2016  . High risk medication use 03/15/2016  . DJD (degenerative joint disease), cervical 03/15/2016  . History of asthma 03/15/2016  . History of squamous cell carcinoma 03/15/2016  . History of basal cell carcinoma 03/15/2016  . History of breast cancer 03/15/2016  . History of Chiari malformation 03/15/2016  . Vitamin D deficiency 03/15/2016  . Rheumatoid arthritis with rheumatoid factor of multiple sites without organ or systems involvement ([place]) 10/20/2015  . Esophageal reflux 05/01/2015  . Anxiety 05/01/2015  . Sjogrens syndrome ([place]) 05/01/2015  . Chronic obstructive airway disease with asthma ([place]) 05/01/2015  . Deviated nasal septum 05/24/2014    Class: Chronic  . Insomnia 11/25/2007  . DYSPNEA 11/15/2007    Past Medical History:  Diagnosis Date  .  Asthma    inhaler one x per day  . Cancer [facility])    breast, left  . Chiari malformation   . PONV (postoperative nausea and vomiting)   . Rheumatoid arthritis (HCC)    on Humira    Family History  Problem Relation Age of  Onset  . Diabetes Mother   . Heart failure Mother   . Bladder Cancer Mother   . Stroke Mother   . Cancer Father        head and neck  . Uterine cancer Maternal Aunt   . Bladder Cancer Maternal Grandmother   . Colon cancer Maternal Grandmother    Past Surgical History:  Procedure Laterality Date  . BREAST SURGERY Left 2003   Lumpectomy T1C1, NO and negative ER/ PR  . COLONOSCOPY  10/04  . COLONOSCOPY  11/10   normal recheck in 5 years  . COLPORRHAPHY    . ESOPHAGOGASTRODUODENOSCOPY ENDOSCOPY  11/10   GERD  . FOOT SURGERY Left age 24   removal of pseudo rheumatoid nodules from left forefoot.  Marland Kitchen LAPAROSCOPIC BILATERAL SALPINGO OOPHERECTOMY N/A 07/30/2013   Procedure: LAPAROSCOPIC BILATERAL SALPINGO OOPHORECTOMY WITH WASHINGS;  Surgeon: Lyman Speller, MD;  Location: [facility];  Service: Gynecology;  Laterality: N/A;  . NASAL SEPTOPLASTY W/ TURBINOPLASTY Bilateral 05/24/2014   Procedure: NASAL SEPTOPLASTY WITH  BILATERAL TURBINATE REDUCTION;  Surgeon: Jerrell Belfast, MD;  Location: [facility];  Service: ENT;  Laterality: Bilateral;  . PELVIC FLOOR REPAIR  3/04   SPARC with AP colporrhaphy  . TOTAL VAGINAL HYSTERECTOMY  1989   with AP repair   Social History   Social History Narrative   Lives alone in a 2 story home. Has 2 children.  Works as a Radiation protection practitioner.  Education: college.    Immunization History  Administered Date(s) Administered  . Influenza,inj,Quad PF,6+ Mos 10/18/2016  . Influenza-Unspecified 10/08/2014, 10/24/2015  . Pneumococcal-Unspecified 10/18/2007  . Tdap 11/24/2008  . Zoster Recombinat (Shingrix) 12/25/2016     Objective: Vital Signs: BP 106/64 (BP Location: Left Arm, Patient Position: Sitting, Cuff Size: Normal)   Pulse 68   Resp 13   Ht 5\' 2"  (1.575 m)   Wt 126 lb 9.6 oz (57.4 kg)   LMP 02/15/1985 (Approximate)   BMI 23.16 kg/m    Physical Exam Vitals signs and nursing note reviewed.  Constitutional:      Appearance: She is well-developed.  HENT:      Head: Normocephalic and atraumatic.  Eyes:     Conjunctiva/sclera: Conjunctivae normal.  Neck:     Musculoskeletal: Normal range of motion.  Cardiovascular:     Rate and Rhythm: Normal rate and regular rhythm.     Heart sounds: Normal heart sounds.  Pulmonary:     Effort: Pulmonary effort is normal.     Breath sounds: Normal breath sounds.  Abdominal:     General: Bowel sounds are normal.     Palpations: Abdomen is soft.  Lymphadenopathy:     Cervical: No cervical adenopathy.  Skin:    General: Skin is warm and dry.     Capillary Refill: Capillary refill takes less than 2 seconds.  Neurological:     Mental Status: She is alert and oriented to person, place, and time.  Psychiatric:        Behavior: Behavior normal.      Musculoskeletal Exam: C-spine, thoracic spine, and lumbar spine limited ROM with discomfort. No midline spinal tenderness.  No SI joint tenderness. Shoulder joints and elbow joints good  ROM with no discomfort.  Limited ROM of both wrist joints.  Synovial thickening and ulnar deviation of MCPs, R>L.  PIP and mild DIP synovial thickening noted.  Hip joints, knee joints, ankle joints, MCPs, PIPs, and DIPs good ROM with no synovitis.  No warmth or effusion of knee joints.  Mild right ankle joint tenderness.  Left ankle good ROM with no tenderness or joint swelling.  No tenderness over trochanteric bursa bilaterally.   CDAI Exam: CDAI Score: 0.6  Patient Global Assessment: 3 (mm); Provider Global Assessment: 3 (mm) Swollen: 0 ; Tender: 1  Joint Exam      Right  Left  Ankle   Tender        Investigation: No additional findings.  Imaging: No results found.  Recent Labs: Lab Results  Component Value Date   WBC 6.6 03/07/2018   HGB 12.2 03/07/2018   PLT 344 03/07/2018   NA 141 03/07/2018   K 3.9 03/07/2018   CL 102 03/07/2018   CO2 21 03/07/2018   GLUCOSE 100 (H) 03/07/2018   BUN 12 03/07/2018   CREATININE 0.52 (L) 03/07/2018   BILITOT 0.3 03/07/2018     ALKPHOS 69 03/07/2018   AST 16 03/07/2018   ALT 15 03/07/2018   PROT 6.2 03/07/2018   ALBUMIN 4.2 03/07/2018   CALCIUM 9.2 03/07/2018   GFRAA 115 03/07/2018   QFTBGOLD Negative 06/15/2016   QFTBGOLDPLUS NEGATIVE 10/19/2017    Speciality Comments: Orencia IV 500mg  every 4 weeks, patient infuses at [facility] Tb gold negative 06/15/16  Procedures:  No procedures performed Allergies: Fish allergy; Iodine; Erythromycin; and Sulfonamide derivatives   Assessment / Plan:     Visit Diagnoses: Rheumatoid arthritis with rheumatoid factor of multiple sites without organ or systems involvement ([place]) - +RF, +CCP: She has no synovitis.  She has not had any recent flares.  She has chronic pain in both feet due to pes planus, overcrowding, and osteoarthritic changes.  She has no synovitis on exam.  She is clinically doing well on Orencia IV infusions every 4 weeks. She typically experiences increased fatigue and some breakthrough pain about 1 week prior to infusions.  She does not want to make any changes at this time.  She is scheduled for lumbar surgery by Dr. Ronnald Ramp on 04/06/18, and she was advised to postpone her next infusion scheduled on 04/04/18.  She will be following up with Dr. Ronnald Ramp on 04/18/18, and she was advised to be cleared by him prior to scheduling her next infusion.  She was advised to notify us if she develops increased joint pain or joint swelling.  She will follow up in 5 months.   High risk medication use - Orencia IV every 4 weeks (inadequate response to Enbrel and Humira). CBC and CMP were drawn 03/07/18.  TB gold negative 10/19/17.  She has not had any recent infections.   DDD (degenerative disc disease), cervical: She has limited ROM with no discomfort.  She has no symptoms of radiculopathy at this time.   DDD (degenerative disc disease), lumbar: Chronic and radiculopathy.  She is scheduled for surgery on 04/06/18 by Dr. Ronnald Ramp.  She was advised to postpone her Orencia infusion that is scheduled  on 04/04/18.  She will need to be cleared by Dr. Ronnald Ramp prior to scheduling her next infusion.  Her follow up appointment with Dr. Ronnald Ramp is scheduled for 04/18/18.    Age-related osteoporosis without current pathological fracture:  She is taking a vitamin D supplement. She has not been  on treatment for osteoporosis. She gets DEXAs at [facility].  She was advised to bring the records of previous bone densities.    Vitamin D deficiency: She takes a vitamin D supplement.   Trochanteric bursitis of right hip: Resolved   Primary insomnia: She takes Restoril 30 mg po at bedtime for insomnia.   Other medical conditions are listed as follows:   Gastroesophageal reflux disease without esophagitis  History of Chiari malformation  History of breast cancer  History of squamous cell carcinoma  History of basal cell carcinoma  History of asthma    Orders: No orders of the defined types were placed in this encounter.  No orders of the defined types were placed in this encounter.    Follow-Up Instructions: Return in about 5 months (around 08/19/2018) for Rheumatoid arthritis, DDD.   Ofilia Neas, PA-C  Note - This record has been created using Dragon software.  Chart creation errors have been sought, but may not always  have been located. Such creation errors do not reflect on  the standard of medical care.

## 2018-03-08 LAB — CBC WITH DIFFERENTIAL/PLATELET
BASOS ABS: 0 10*3/uL (ref 0.0–0.2)
Basos: 1 %
EOS (ABSOLUTE): 0.3 10*3/uL (ref 0.0–0.4)
Eos: 4 %
Hematocrit: 36.2 % (ref 34.0–46.6)
Hemoglobin: 12.2 g/dL (ref 11.1–15.9)
Immature Grans (Abs): 0 10*3/uL (ref 0.0–0.1)
Immature Granulocytes: 0 %
Lymphocytes Absolute: 1.7 10*3/uL (ref 0.7–3.1)
Lymphs: 26 %
MCH: 28.9 pg (ref 26.6–33.0)
MCHC: 33.7 g/dL (ref 31.5–35.7)
MCV: 86 fL (ref 79–97)
Monocytes Absolute: 0.5 10*3/uL (ref 0.1–0.9)
Monocytes: 7 %
Neutrophils Absolute: 4 10*3/uL (ref 1.4–7.0)
Neutrophils: 62 %
Platelets: 344 10*3/uL (ref 150–450)
RBC: 4.22 x10E6/uL (ref 3.77–5.28)
RDW: 12.6 % (ref 11.7–15.4)
WBC: 6.6 10*3/uL (ref 3.4–10.8)

## 2018-03-08 LAB — COMPREHENSIVE METABOLIC PANEL
ALT: 15 IU/L (ref 0–32)
AST: 16 IU/L (ref 0–40)
Albumin/Globulin Ratio: 2.1 (ref 1.2–2.2)
Albumin: 4.2 g/dL (ref 3.8–4.8)
Alkaline Phosphatase: 69 IU/L (ref 39–117)
BUN/Creatinine Ratio: 23 (ref 12–28)
BUN: 12 mg/dL (ref 8–27)
Bilirubin Total: 0.3 mg/dL (ref 0.0–1.2)
CHLORIDE: 102 mmol/L (ref 96–106)
CO2: 21 mmol/L (ref 20–29)
Calcium: 9.2 mg/dL (ref 8.7–10.3)
Creatinine, Ser: 0.52 mg/dL — ABNORMAL LOW (ref 0.57–1.00)
GFR calc Af Amer: 115 mL/min/{1.73_m2} (ref >59)
GFR calc non Af Amer: 100 mL/min/{1.73_m2} (ref >59)
GLUCOSE: 100 mg/dL — AB (ref 65–99)
Globulin, Total: 2 g/dL (ref 1.5–4.5)
Potassium: 3.9 mmol/L (ref 3.5–5.2)
Sodium: 141 mmol/L (ref 134–144)
Total Protein: 6.2 g/dL (ref 6.0–8.5)

## 2018-03-09 ENCOUNTER — Telehealth: Payer: Self-pay | Admitting: *Deleted

## 2018-03-09 NOTE — Telephone Encounter (Signed)
Called pt. Relayed results per Dr. Felecia Shelling noe. She verbalized understanding.

## 2018-03-09 NOTE — Telephone Encounter (Signed)
-----   Message from Britt Bottom, MD sent at 03/08/2018  6:16 PM EST ----- Please let the patient know that the lab work is fine.

## 2018-03-21 ENCOUNTER — Encounter: Payer: Self-pay | Admitting: Physician Assistant

## 2018-03-21 ENCOUNTER — Ambulatory Visit (INDEPENDENT_AMBULATORY_CARE_PROVIDER_SITE_OTHER): Payer: Medicare Other | Admitting: Physician Assistant

## 2018-03-21 VITALS — BP 106/64 | HR 68 | Resp 13 | Ht 62.0 in | Wt 126.6 lb

## 2018-03-21 DIAGNOSIS — M7061 Trochanteric bursitis, right hip: Secondary | ICD-10-CM

## 2018-03-21 DIAGNOSIS — Z8669 Personal history of other diseases of the nervous system and sense organs: Secondary | ICD-10-CM

## 2018-03-21 DIAGNOSIS — K219 Gastro-esophageal reflux disease without esophagitis: Secondary | ICD-10-CM

## 2018-03-21 DIAGNOSIS — Z8709 Personal history of other diseases of the respiratory system: Secondary | ICD-10-CM

## 2018-03-21 DIAGNOSIS — F5101 Primary insomnia: Secondary | ICD-10-CM | POA: Diagnosis not present

## 2018-03-21 DIAGNOSIS — Z8589 Personal history of malignant neoplasm of other organs and systems: Secondary | ICD-10-CM | POA: Diagnosis not present

## 2018-03-21 DIAGNOSIS — M5030 Other cervical disc degeneration, unspecified cervical region: Secondary | ICD-10-CM

## 2018-03-21 DIAGNOSIS — M810 Age-related osteoporosis without current pathological fracture: Secondary | ICD-10-CM

## 2018-03-21 DIAGNOSIS — M5136 Other intervertebral disc degeneration, lumbar region: Secondary | ICD-10-CM | POA: Diagnosis not present

## 2018-03-21 DIAGNOSIS — M0579 Rheumatoid arthritis with rheumatoid factor of multiple sites without organ or systems involvement: Secondary | ICD-10-CM | POA: Diagnosis not present

## 2018-03-21 DIAGNOSIS — E559 Vitamin D deficiency, unspecified: Secondary | ICD-10-CM

## 2018-03-21 DIAGNOSIS — Z853 Personal history of malignant neoplasm of breast: Secondary | ICD-10-CM | POA: Diagnosis not present

## 2018-03-21 DIAGNOSIS — Z79899 Other long term (current) drug therapy: Secondary | ICD-10-CM | POA: Diagnosis not present

## 2018-03-21 DIAGNOSIS — Z85828 Personal history of other malignant neoplasm of skin: Secondary | ICD-10-CM

## 2018-03-27 ENCOUNTER — Encounter: Payer: Self-pay | Admitting: Neurology

## 2018-03-27 ENCOUNTER — Ambulatory Visit (INDEPENDENT_AMBULATORY_CARE_PROVIDER_SITE_OTHER): Payer: Medicare Other | Admitting: Neurology

## 2018-03-27 VITALS — BP 98/60 | HR 98 | Ht 62.0 in | Wt 126.5 lb

## 2018-03-27 DIAGNOSIS — M5136 Other intervertebral disc degeneration, lumbar region: Secondary | ICD-10-CM | POA: Diagnosis not present

## 2018-03-27 DIAGNOSIS — M0579 Rheumatoid arthritis with rheumatoid factor of multiple sites without organ or systems involvement: Secondary | ICD-10-CM | POA: Diagnosis not present

## 2018-03-27 DIAGNOSIS — Z8669 Personal history of other diseases of the nervous system and sense organs: Secondary | ICD-10-CM

## 2018-03-27 DIAGNOSIS — Z79899 Other long term (current) drug therapy: Secondary | ICD-10-CM | POA: Diagnosis not present

## 2018-03-27 NOTE — Progress Notes (Signed)
[facility]  [name] DOB: 08-Feb-1951  REFERRING DOCTOR OR PCP:  Dr. Estanislado Pandy SOURCE: patient, notes from Dr. Estanislado Pandy, labs  _________________________________   HISTORICAL  CHIEF COMPLAINT:  Chief Complaint  Patient presents with  . Follow-up    RM 12, alone. Last seen 03/24/17  . Rheumatoid Arthritis    Receiving Orencia infusions. Last infusion: Last month around 03/07/18. Next one scheduled for 04/04/18 but was put on hold d/t her having surgery soon.    HISTORY OF PRESENT ILLNESS:  I had the pleasure seeing you patient, Kristy Perez, at [facility] for neurologic consultation regarding Orencia infusions for her rheumatoid arthritis.   Update 03/27/2018: Sh feels her RA is doing well.  She follows up with Dr. Estanislado Pandy regularly.  She continues on Orencia.  She tolerates it well and has not had any significant flareups.   Recent lab work has been doing well.  She is going to have an L2-L3 operation for lumbar spinal degenerative (Dr. Ronnald Ramp) soon  She also has a Chiari malformatin.   No headaches or syncope but coughing fits cause severe headaches.     Update 03/24/2017: She was diagnosed with rheumatoid factor positive rheumatoid arthritis in 2002. She notes pain in her hands and feet.  Pain seems worse towards the end of each monthly Orencia cycle.   Pain is also in the left shoulder.     She has been on Orencia IV x 4-5 years.   She tolerates it well.     Lab work has done well.    She was on Humira, Enbrel and MTX in the past.     Humira had worked well but she had gingival disease so stopped.   She gets lab work every other cycle.     She has a small Chiari malformation noted on imaging studies. It extends about 5 mm.   She gets brain or cervical spine studies periodically to follow this as she is at risk due to her rheumatoid arthritis and potential pannus development. Adjacent brain appears normal.  From 06/22/2016: She  had had problems with joint pain, especially in her feet times many years. Around 2002, she was diagnosed with rheumatoid arthritis after a rheumatoid factor returned positive. He was to start methotrexate and Plaquenil but was diagnosed with breast cancer around that time so those medicines were discontinued while she was undergoing treatment with breast cancer. She had a complete response to her treatment (surgery, chemotherapy and radiation) remains cancer free.   After her chemotherapy, she went back on plaque on until plus methotrexate injections. About 12 years ago, she switched to Humira and was on that for about 8 years. She had many dental issues including abscesses and it was discontinued. Enbrel was tried but it was less effective for her RA. About 4 years ago she started Orencia infusions. She has tolerated them well and has not had any problems with infections. Typically, the day of the infusion she will fill tired or achy. The next day she may also feel tired. The rest of the month she does well..    She is on Orencia 500 mg IV every 4 weeks.    She had her last infusion 06/15/2016.     She has allergies to Iodine injection (xray/CT) and fish.  She does ok with Betadine.    Besides breast cancer, she has had had several Basal cell and squamous cell cancers.   She had Mohs surgery on her  face.      She also has had a Chiari Malformation and was seeing Dr. Carloyn Manner of Neurosurgery.   Because she also has risk of pannus development from her rheumatoid arthritis, and the MRI on an annual or near annual basis. The last MRI of her brain was performed January 2018. I personally reviewed the images. It shows a borderline Chiari malformation (looked mild on prior MRI). The adjacent brain and spinal cord have normal signal.  REVIEW OF SYSTEMS: Constitutional: No fevers, chills, sweats, or change in appetite.    She gets fatigue the last week of each Orencia cycle.     Eyes: No visual changes, double vision,  eye pain Ear, nose and throat: No hearing loss, ear pain, nasal congestion, sore throat Cardiovascular: No chest pain, palpitations Respiratory: No shortness of breath at rest or with exertion.   No wheezes GastrointestinaI: No nausea, vomiting, diarrhea, abdominal pain, fecal incontinence.   She has GERD Genitourinary: No dysuria, urinary retention.   She notes urinary frequency and nocturia. Musculoskeletal: as above Integumentary: No rash, pruritus, skin lesions Neurological: as above.   She notes decreased focus, improved with Adderall Psychiatric: No depression at this time.  No anxiety Endocrine: No palpitations, diaphoresis, change in appetite, change in weigh or increased thirst Hematologic/Lymphatic: No anemia, purpura, petechiae. Allergic/Immunologic: No itchy/runny eyes, nasal congestion, recent allergic reactions, rashes  ALLERGIES: Allergies  Allergen Reactions  . Fish Allergy Anaphylaxis  . Iodine Anaphylaxis    " PER PT IV CONTRAST"  . Erythromycin     UNSPECIFIED REACTION   . Sulfonamide Derivatives Other (See Comments)    Tongue turns black.    HOME MEDICATIONS:  Current Outpatient Medications:  .  Abatacept (ORENCIA IV), Inject 500 mg into the vein every 30 (thirty) days. Infuse 2 vials / 500 mg over 30 minutes every 4 weeks, Disp: , Rfl:  .  acetaminophen (TYLENOL) 325 MG tablet, Take 650 mg by mouth daily as needed (One hour prior to infusion). , Disp: , Rfl:  .  amoxicillin (AMOXIL) 500 MG capsule, Take 2,000 mg by mouth See admin instructions. Take 2000 mg by mouth 1 hour prior to dental appointment, Disp: , Rfl: 2 .  amphetamine-dextroamphetamine (ADDERALL) 5 MG tablet, Take 10 mg by mouth daily as needed (stress). , Disp: , Rfl: 0 .  chlorpheniramine (CHLOR-TRIMETON) 4 MG tablet, Take 4 mg by mouth daily as needed for allergies., Disp: , Rfl:  .  diphenhydrAMINE (BENADRYL) 25 mg capsule, Take 25 mg by mouth daily as needed (One hour prior to infusion). ,  Disp: , Rfl:  .  famotidine (PEPCID) 10 MG tablet, Take 10 mg by mouth 2 (two) times daily as needed for heartburn or indigestion. , Disp: , Rfl:  .  ibuprofen (ADVIL,MOTRIN) 200 MG tablet, Take 400 mg by mouth every 6 (six) hours as needed (for inflammation)., Disp: , Rfl:  .  Liniments (SALONPAS EX), Apply topically as needed., Disp: , Rfl:  .  Magnesium 250 MG TABS, Take 500-1,000 tablets by mouth daily. , Disp: , Rfl:  .  mometasone-formoterol (DULERA) 100-5 MCG/ACT AERO, Inhale 1 puff into the lungs daily. , Disp: , Rfl:  .  NONFORMULARY OR COMPOUNDED ITEM, Estradiol 0.02% vaginal cream.  Place 1/2 gram vaginally two to three times weekly at bedtime. Disp:  3 month supply (Patient taking differently: Place 0.5 Applicatorfuls vaginally once a week. Estradiol 0.02% vaginal cream.  Place 1/2 gram vaginally two to three times weekly at bedtime. Disp:  3 month supply), Disp: 1 each, Rfl: 3 .  Omega-3 Fatty Acids (FISH OIL) 1000 MG CAPS, Take 1,000 mg by mouth daily., Disp: , Rfl:  .  promethazine (PHENERGAN) 25 MG tablet, Take 25 mg by mouth every 6 (six) hours as needed for nausea or vomiting., Disp: , Rfl:  .  RESTASIS 0.05 % ophthalmic emulsion, Place 1 drop into both eyes every morning. , Disp: , Rfl:  .  Sodium Fluoride 1.1 % PSTE, Place 1 application onto teeth 2 (two) times daily. , Disp: , Rfl:  .  temazepam (RESTORIL) 30 MG capsule, Take 30 mg by mouth at bedtime. , Disp: , Rfl:  .  VENTOLIN HFA 108 (90 BASE) MCG/ACT inhaler, Inhale 2 puffs into the lungs every 4 (four) hours as needed for wheezing or shortness of breath. , Disp: , Rfl:  .  Vitamin D, Ergocalciferol, (DRISDOL) 50000 units CAPS capsule, Take 1 capsule (50,000 Units total) by mouth every 7 (seven) days., Disp: 12 capsule, Rfl: 4  PAST MEDICAL HISTORY: Past Medical History:  Diagnosis Date  . Asthma    inhaler one x per day  . Cancer [facility]    breast, left  . Chiari malformation   . PONV (postoperative nausea and  vomiting)   . Rheumatoid arthritis ([place])    on Humira    PAST SURGICAL HISTORY: Past Surgical History:  Procedure Laterality Date  . BREAST SURGERY Left 2003   Lumpectomy T1C1, NO and negative ER/ PR  . COLONOSCOPY  10/04  . COLONOSCOPY  11/10   normal recheck in 5 years  . COLPORRHAPHY    . ESOPHAGOGASTRODUODENOSCOPY ENDOSCOPY  11/10   GERD  . FOOT SURGERY Left age 29   removal of pseudo rheumatoid nodules from left forefoot.  Marland Kitchen LAPAROSCOPIC BILATERAL SALPINGO OOPHERECTOMY N/A 07/30/2013   Procedure: LAPAROSCOPIC BILATERAL SALPINGO OOPHORECTOMY WITH WASHINGS;  Surgeon: Lyman Speller, MD;  Location: [facility];  Service: Gynecology;  Laterality: N/A;  . NASAL SEPTOPLASTY W/ TURBINOPLASTY Bilateral 05/24/2014   Procedure: NASAL SEPTOPLASTY WITH  BILATERAL TURBINATE REDUCTION;  Surgeon: Jerrell Belfast, MD;  Location: [facility];  Service: ENT;  Laterality: Bilateral;  . PELVIC FLOOR REPAIR  3/04   SPARC with AP colporrhaphy  . TOTAL VAGINAL HYSTERECTOMY  1989   with AP repair    FAMILY HISTORY: Family History  Problem Relation Age of Onset  . Diabetes Mother   . Heart failure Mother   . Bladder Cancer Mother   . Stroke Mother   . Cancer Father        head and neck  . Uterine cancer Maternal Aunt   . Bladder Cancer Maternal Grandmother   . Colon cancer Maternal Grandmother     SOCIAL HISTORY:  Social History   Socioeconomic History  . Marital status: Divorced    Spouse name: Not on file  . Number of children: 2  . Years of education: Not on file  . Highest education level: Not on file  Occupational History  . Not on file  Social Needs  . Financial resource strain: Not on file  . Food insecurity:    Worry: Not on file    Inability: Not on file  . Transportation needs:    Medical: Not on file    Non-medical: Not on file  Tobacco Use  . Smoking status: Former Smoker    Types: Cigarettes    Last attempt to quit: 02/16/1999    Years since quitting: 19.1  .  Smokeless tobacco:  Never Used  Substance and Sexual Activity  . Alcohol use: No  . Drug use: No  . Sexual activity: Not Currently    Partners: Male  Lifestyle  . Physical activity:    Days per week: Not on file    Minutes per session: Not on file  . Stress: Not on file  Relationships  . Social connections:    Talks on phone: Not on file    Gets together: Not on file    Attends religious service: Not on file    Active member of club or organization: Not on file    Attends meetings of clubs or organizations: Not on file    Relationship status: Not on file  . Intimate partner violence:    Fear of current or ex partner: Not on file    Emotionally abused: Not on file    Physically abused: Not on file    Forced sexual activity: Not on file  Other Topics Concern  . Not on file  Social History Narrative   Lives alone in a 2 story home. Has 2 children.  Works as a Radiation protection practitioner.  Education: college.      PHYSICAL EXAM  Vitals:   03/27/18 0953  BP: 98/60  Pulse: 98  SpO2: 98%  Weight: 126 lb 8 oz (57.4 kg)  Height: 5\' 2"  (1.575 m)    Body mass index is 23.14 kg/m.   General: The patient is well-developed and well-nourished and in no acute distress   Neck:  The neck is nontender.   Skin: Extremities are without rash or edema.  Musculoskeletal:  Back is nontender.   Joints in her feet and hands are tender.   Ulnar deviation of the fingers bilaterally.  Neurologic Exam  Mental status: The patient is alert and oriented x 3 at the time of the examination. The patient has apparent normal recent and remote memory, with an apparently normal attention span and concentration ability.   Speech is normal.  Cranial nerves: Extraocular movements are full. Facial strength and sensation is normal.  Trapezius and sternocleidomastoid strength is normal.  . No obvious hearing deficits are noted.  Motor:  Muscle bulk is normal.  Muscle tone is normal.  Strength is 5/5.Marland Kitchen    Coordination:  Cerebellar testing reveals good finger-nose-finger bilaterally.  Gait and station: Station is normal.  Due to back pain she prefers standing to sitting.  The gait is arthritic.. Tandem gait is mildly wide. Romberg is negative.   Reflexes: Deep tendon reflexes are symmetric and normal bilaterally.        DIAGNOSTIC DATA (LABS, IMAGING, TESTING) - I reviewed patient records, labs, notes, testing and imaging myself where available.  Lab Results  Component Value Date   WBC 6.6 03/07/2018   HGB 12.2 03/07/2018   HCT 36.2 03/07/2018   MCV 86 03/07/2018   PLT 344 03/07/2018      Component Value Date/Time   NA 141 03/07/2018 1039   K 3.9 03/07/2018 1039   CL 102 03/07/2018 1039   CO2 21 03/07/2018 1039   GLUCOSE 100 (H) 03/07/2018 1039   GLUCOSE 91 06/15/2016 0951   BUN 12 03/07/2018 1039   CREATININE 0.52 (L) 03/07/2018 1039   CALCIUM 9.2 03/07/2018 1039   PROT 6.2 03/07/2018 1039   ALBUMIN 4.2 03/07/2018 1039   AST 16 03/07/2018 1039   ALT 15 03/07/2018 1039   ALKPHOS 69 03/07/2018 1039   BILITOT 0.3 03/07/2018 1039   GFRNONAA 100 03/07/2018 1039  GFRAA 115 03/07/2018 1039   Lab Results  Component Value Date   CHOL 228 (H) 07/07/2017   HDL 68 07/07/2017   LDLCALC 142 (H) 07/07/2017   TRIG 91 07/07/2017   CHOLHDL 3.4 07/07/2017   Lab Results  Component Value Date   HGBA1C 5.2 03/08/2016   No results found for: VITAMINB12 Lab Results  Component Value Date   TSH 1.20 03/08/2016       ASSESSMENT AND PLAN  Rheumatoid arthritis with rheumatoid factor of multiple sites without organ or systems involvement ([place])  High risk medication use  History of Chiari malformation  DDD (degenerative disc disease), lumbar    1.    Continue Orencia 500 mg every 4 weeks and get lab testing every 8 weeks.   2.    The Chiari malformation appears to be stable.  She does not have significant symptoms from it. 3.     return in one year.   She will call sooner if she has any  new or worsening neurologic symptoms.      Richard A. Felecia Shelling, MD, PhD 1/61/0960, 4:54 PM Certified in Neurology, Clinical Neurophysiology, Sleep Medicine, Pain Medicine and Neuroimaging  [facility] [address] [place], [place] [address] [phone]

## 2018-03-29 NOTE — Pre-Procedure Instructions (Signed)
JOSEFINA RYNDERS  03/29/2018      [facility], [place] - [address] [place] [address] Phone: [phone] Fax: [phone]    Your procedure is scheduled on Thursday February 20th.  Report to [facility] Admitting at 5:30 A.M.  Call this number if you have problems the morning of surgery:  [phone]   Remember:  Do not eat or drink after midnight.    Take these medicines the morning of surgery with A SIP OF WATER  famotidine (PEPCID) mometasone-formoterol (DULERA)  acetaminophen (TYLENOL)- if needed promethazine (PHENERGAN)-if needed RESTASIS 0.05 % ophthalmic emulsion-if needed VENTOLIN HFA 108 (90 BASE)-if needed *bring with you to the hospital day of surgery*  7 days prior to surgery STOP taking any Aspirin(unless otherwise instructed by your surgeon), Aleve, Naproxen, Ibuprofen, Motrin, Advil, Goody's, BC's, all herbal medications, fish oil, and all vitamins    Do not wear jewelry, make-up or nail polish.  Do not wear lotions, powders, or perfumes, or deodorant.  Do not shave 48 hours prior to surgery.    Do not bring valuables to the hospital.  [facility] is not responsible for any belongings or valuables.  Contacts, dentures or bridgework may not be worn into surgery.  Leave your suitcase in the car.  After surgery it may be brought to your room.  For patients admitted to the hospital, discharge time will be determined by your treatment team.  Patients discharged the day of surgery will not be allowed to drive home.   [place]- Preparing For Surgery  Before surgery, you can play an important role. Because skin is not sterile, your skin needs to be as free of germs as possible. You can reduce the number of germs on your skin by washing with CHG (chlorahexidine gluconate) Soap before surgery.  CHG is an antiseptic cleaner which kills germs and  bonds with the skin to continue killing germs even after washing.    Oral Hygiene is also important to reduce your risk of infection.  Remember - BRUSH YOUR TEETH THE MORNING OF SURGERY WITH YOUR REGULAR TOOTHPASTE  Please do not use if you have an allergy to CHG or antibacterial soaps. If your skin becomes reddened/irritated stop using the CHG.  Do not shave (including legs and underarms) for at least 48 hours prior to first CHG shower. It is OK to shave your face.  Please follow these instructions carefully.   1. Shower the NIGHT BEFORE SURGERY and the MORNING OF SURGERY with CHG.   2. If you chose to wash your hair, wash your hair first as usual with your normal shampoo.  3. After you shampoo, rinse your hair and body thoroughly to remove the shampoo.  4. Use CHG as you would any other liquid soap. You can apply CHG directly to the skin and wash gently with a scrungie or a clean washcloth.   5. Apply the CHG Soap to your body ONLY FROM THE NECK DOWN.  Do not use on open wounds or open sores. Avoid contact with your eyes, ears, mouth and genitals (private parts). Wash Face and genitals (private parts)  with your normal soap.  6. Wash thoroughly, paying special attention to the area where your surgery will be performed.  7. Thoroughly rinse your body with warm water from the neck down.  8. DO NOT shower/wash with your normal soap after using and rinsing off the CHG  Soap.  9. Pat yourself dry with a CLEAN TOWEL.  10. Wear CLEAN PAJAMAS to bed the night before surgery, wear comfortable clothes the morning of surgery  11. Place CLEAN SHEETS on your bed the night of your first shower and DO NOT SLEEP WITH PETS.    Day of Surgery: Shower as stated above. Do not apply any deodorants/lotions.  Please wear clean clothes to the hospital/surgery center.   Remember to brush your teeth WITH YOUR REGULAR TOOTHPASTE.  Please read over the following fact sheets that you were  given.

## 2018-03-30 ENCOUNTER — Other Ambulatory Visit: Payer: Self-pay

## 2018-03-30 ENCOUNTER — Encounter (HOSPITAL_COMMUNITY): Payer: Self-pay

## 2018-03-30 ENCOUNTER — Encounter (HOSPITAL_COMMUNITY)
Admission: RE | Admit: 2018-03-30 | Discharge: 2018-03-30 | Disposition: A | Discharge status: Home / Self Care | Payer: Medicare Other | Source: Ambulatory Visit | Attending: Neurological Surgery | Admitting: Neurological Surgery

## 2018-03-30 DIAGNOSIS — Z01818 Encounter for other preprocedural examination: Secondary | ICD-10-CM | POA: Diagnosis not present

## 2018-03-30 HISTORY — DX: Failed or difficult intubation, initial encounter: T88.4XXA

## 2018-03-30 HISTORY — DX: Cardiac arrhythmia, unspecified: I49.9

## 2018-03-30 HISTORY — DX: Gastro-esophageal reflux disease without esophagitis: K21.9

## 2018-03-30 HISTORY — DX: Anemia, unspecified: D64.9

## 2018-03-30 LAB — CBC
HCT: 39.3 % (ref 36.0–46.0)
Hemoglobin: 12.7 g/dL (ref 12.0–15.0)
MCH: 28 pg (ref 26.0–34.0)
MCHC: 32.3 g/dL (ref 30.0–36.0)
MCV: 86.8 fL (ref 80.0–100.0)
NRBC: 0 % (ref 0.0–0.2)
Platelets: 367 10*3/uL (ref 150–400)
RBC: 4.53 MIL/uL (ref 3.87–5.11)
RDW: 12.6 % (ref 11.5–15.5)
WBC: 7.9 10*3/uL (ref 4.0–10.5)

## 2018-03-30 LAB — BASIC METABOLIC PANEL
Anion gap: 10 (ref 5–15)
BUN: 11 mg/dL (ref 8–23)
CO2: 23 mmol/L (ref 22–32)
Calcium: 9 mg/dL (ref 8.9–10.3)
Chloride: 107 mmol/L (ref 98–111)
Creatinine, Ser: 0.51 mg/dL (ref 0.44–1.00)
Glucose, Bld: 91 mg/dL (ref 70–99)
Potassium: 3.7 mmol/L (ref 3.5–5.1)
SODIUM: 140 mmol/L (ref 135–145)

## 2018-03-30 LAB — SURGICAL PCR SCREEN
MRSA, PCR: NEGATIVE
Staphylococcus aureus: NEGATIVE

## 2018-03-30 NOTE — Progress Notes (Addendum)
PCP: Dr. Kelton Pillar sees yearly Cardiologist: Denies Rheumatology: Dr. Bo Merino sees q48months  EKG: Today CXR: n/a ECHO: n/a Stress Test: n/a Cardiac Cath: n/a  No orders at PAT appt, sign consent DOS.   Patient denies shortness of breath, fever, cough, and chest pain at PAT appointment.  Patient verbalized understanding of instructions provided today at the PAT appointment.  Patient asked to review instructions at home and day of surgery.

## 2018-03-31 ENCOUNTER — Encounter (HOSPITAL_COMMUNITY): Payer: Self-pay

## 2018-03-31 NOTE — Anesthesia Preprocedure Evaluation (Addendum)
Anesthesia Evaluation  Patient identified by MRN, date of birth, ID band Patient awake    Reviewed: Allergy & Precautions, NPO status , Patient's Chart, lab work & pertinent test results  History of Anesthesia Complications (+) PONV, DIFFICULT AIRWAY and history of anesthetic complications  Airway Mallampati: II  TM Distance: >3 FB Neck ROM: Full    Dental  (+) Dental Advisory Given, Teeth Intact   Pulmonary shortness of breath, asthma , COPD, former smoker,    Pulmonary exam normal breath sounds clear to auscultation       Cardiovascular Normal cardiovascular exam+ dysrhythmias  Rhythm:Regular Rate:Normal     Neuro/Psych PSYCHIATRIC DISORDERS Anxiety  Neuromuscular disease    GI/Hepatic Neg liver ROS, GERD  ,  Endo/Other  negative endocrine ROS  Renal/GU negative Renal ROS     Musculoskeletal  (+) Arthritis , Rheumatoid disorders,    Abdominal   Peds  Hematology  (+) Blood dyscrasia, anemia ,   Anesthesia Other Findings Day of surgery medications reviewed with the patient.  Reproductive/Obstetrics                                                           Anesthesia Evaluation  Patient identified by MRN, date of birth, ID band Patient awake    Reviewed: Allergy & Precautions, NPO status   History of Anesthesia Complications (+) PONV  Airway Mallampati: II  TM Distance: >3 FB Neck ROM: Full    Dental  (+) Teeth Intact, Dental Advisory Given   Pulmonary shortness of breath, asthma , former smoker,  breath sounds clear to auscultation        Cardiovascular negative cardio ROS  Rhythm:Regular Rate:Normal     Neuro/Psych Chiari malformation    GI/Hepatic negative GI ROS, Neg liver ROS,   Endo/Other  negative endocrine ROS  Renal/GU negative Renal ROS     Musculoskeletal  (+) Arthritis -, Rheumatoid disorders,  AO instability   Abdominal   Peds  Hematology negative hematology ROS (+)   Anesthesia Other Findings   Reproductive/Obstetrics                           Anesthesia Physical Anesthesia Plan  ASA: III  Anesthesia Plan: General   Post-op Pain Management:    Induction: Intravenous  Airway Management Planned: Oral ETT and Video Laryngoscope Planned  Additional Equipment:   Intra-op Plan:   Post-operative Plan: Extubation in OR  Informed Consent: I have reviewed the patients History and Physical, chart, labs and discussed the procedure including the risks, benefits and alternatives for the proposed anesthesia with the patient or authorized representative who has indicated his/her understanding and acceptance.   Dental advisory given  Plan Discussed with: CRNA  Anesthesia Plan Comments:        Anesthesia Quick Evaluation                                   Anesthesia Evaluation  Patient identified by MRN, date of birth, ID band Patient awake    Reviewed: Allergy & Precautions, H&P , Patient's Chart, lab work & pertinent test results, reviewed documented beta blocker date and time   History of Anesthesia Complications (+) PONV and history  of anesthetic complications  Airway Mallampati: II TM Distance: >3 FB Neck ROM: full    Dental   Pulmonary asthma , former smoker,  breath sounds clear to auscultation        Cardiovascular Exercise Tolerance: Good Rhythm:regular Rate:Normal     Neuro/Psych    GI/Hepatic   Endo/Other    Renal/GU      Musculoskeletal  (+) Arthritis -,   Abdominal   Peds  Hematology   Anesthesia Other Findings Chiari malformation Rheumatoid arthritis  Reproductive/Obstetrics                        Anesthesia Physical Anesthesia Plan  ASA: III  Anesthesia Plan: General ETT   Post-op Pain Management:    Induction:   Airway Management Planned: Video Laryngoscope Planned and Fiberoptic Intubation  Planned  Additional Equipment:   Intra-op Plan:   Post-operative Plan:   Informed Consent: I have reviewed the patients History and Physical, chart, labs and discussed the procedure including the risks, benefits and alternatives for the proposed anesthesia with the patient or authorized representative who has indicated his/her understanding and acceptance.   Dental Advisory Given  Plan Discussed with: CRNA and Surgeon  Anesthesia Plan Comments:       chiari with tonsillar involvement per MRI. RA with AO instability... Will position awake and use minimal trendelenberg Anesthesia Quick Evaluation  Anesthesia Physical Anesthesia Plan  ASA: II  Anesthesia Plan: General   Post-op Pain Management:    Induction: Intravenous  PONV Risk Score and Plan: 4 or greater and Ondansetron, Dexamethasone, Treatment may vary due to age or medical condition and Midazolam  Airway Management Planned: Oral ETT  Additional Equipment: None  Intra-op Plan:   Post-operative Plan: Extubation in OR  Informed Consent: I have reviewed the patients History and Physical, chart, labs and discussed the procedure including the risks, benefits and alternatives for the proposed anesthesia with the patient or authorized representative who has indicated his/her understanding and acceptance.     Dental advisory given  Plan Discussed with: CRNA, Anesthesiologist and Surgeon  Anesthesia Plan Comments: (PAT note written 03/31/2018 by Myra Gianotti, PA-C. )     Anesthesia Quick Evaluation

## 2018-03-31 NOTE — Progress Notes (Signed)
Anesthesia Chart Review:  Case:  [id number] Date/Time:  04/06/18 0715   Procedure:  Extraforaminal Microdiscectomy - L2-L3 - right (Right Back)   Anesthesia type:  General   Pre-op diagnosis:  HNP   Location:  [facility] OR ROOM 21 / [place] [facility]   Surgeon:  Eustace Lightsey, MD      DISCUSSION: Patient is a 68 year old female scheduled for the above procedure.  History includes RA, Chiari 1 malformation (with impaction of the tonsils, relative stenosis of the foramen magnum on 2006 MRI), former smoker, asthma, left breast cancer (s/p lumpectomy '03), PACs, GERD, asthma. Potential for DIFFICULT AIRWAY given avoidance of neck hyperextention due to Chiari malformation and RA.   Anesthesia Records/Precautions 2015-2016:  - 05/24/14: Intubation Type: IV induction Laryngoscope Size: Glidescope Grade View: Grade I Tube size: 7.5 mm Number of attempts: 1 Airway Equipment and Method: Stylet and Video-laryngoscopy   - 07/30/13:  positioning awake with use of minimal trendelenburg's (due to 20 cm chiari with tonsillar involvement per MRI and RA with AO instability). Head on form cradle in neutral position. 7 mm cuffed ETT placed with neck in neutral position for intubation after induction using stylet, video laryngoscope, direct visualization.   (Anesthesia issues she discussed prior to 05/24/14 surgery included letting her anesthesia team know that she has not tolerated MAC anesthesia in the past due to hypotension, that dexamethasone 10 mg perioperatively helped her in the past with post-operative mobility and comfort, to avoid lying her flat on her back post-operatively, and has some limitation in mouth opening due to TMJ.)  If no acute changes then I anticipate that she can proceed as planned.    VS: BP (!) 114/55   Pulse 83   Temp 36.8 C   Resp 18   Ht 5' 2"  (1.575 m)   Wt 57 kg   LMP 02/15/1985 (Approximate)   SpO2 97%   BMI 22.99 kg/m     PROVIDERS: Kelton Pillar, MD is PCP. Estanislado Pandy, [place] Constable, MD  is rheumatologist. Last seen by Hazel Sams, PA-C on 03/21/18 who writes, "She is scheduled for surgery on 04/06/18 by Dr. Ronnald Ramp.  She was advised to postpone her Orencia infusion that is scheduled on 04/04/18.  She will need to be cleared by Dr. Ronnald Ramp prior to scheduling her next infusion."  - Arlice Colt, MD is neurologist. Lat visit 03/27/18. Chiari malformation felt stable with no significant symptoms from it. One year follow-up recommended.    LABS: Labs reviewed: Acceptable for surgery. (all labs ordered are listed, but only abnormal results are displayed)  Labs Reviewed  SURGICAL PCR SCREEN  BASIC METABOLIC PANEL  CBC    IMAGES: MRI brain 03/23/16: MPRESSION: 1. No acute abnormality. 2. Chiari 1 malformation is less prominent on today's study than previously seen. The cerebellar tonsils extend to the foramen magnum in the midline. There is still some kinking of the brainstem inferior extension is more prominent on the right, as before. 3. Scattered white matter disease is stable. (Comparison MRI 10/10/04: Chiari I malformation with impaction of the tonsils. There is some dorsal displacement of the cervicomedullary junction due to a dorsal tilt of the dens and some transverse ligament hypertrophy with relative stenosis of the foramen magnum.)  MRI/MRA of the neck/c-spine 09/22/04:  IMPRESSION (MRI):  1. Cervical spondylotic changes C4-5 through C6-7 contributes to mild spinal stenosis and right greater than left foraminal narrowing at the C5-6 and C6-7 levels. No large soft lateralizing disc protrusion.  2. Slightly  low lying cerebellar tonsils up to 4.1 mm below the foramen magnum.  IMPRESSION (MRA):  No evidence of hemodynamically significant stenosis involving the major extracranial vessels as noted above. The left vertebral artery is dominant and appears slightly ectatic at the C1-2 level which may represent limitation of the present exam although mild fibromuscular dysplasia  could conceivably cause a similar appearance.   EKG: 03/30/18: NSR with sinus arrhythmia   CV:  NM cardiac MUGA scan (for breast cancer, chemo) 08/04/01: IMPRESSION RESTING LEFT VENTRICULAR FRACTION APPROXIMATELY 57%.   Past Medical History:  Diagnosis Date  . Anemia   . Asthma    inhaler one x per day  . Cancer [facility])    breast, left  . Chiari malformation   . Difficult intubation    cannot hyperextend neck due to chiari malformation  . Dysrhythmia 2015   history of PAC's  . GERD (gastroesophageal reflux disease)   . PONV (postoperative nausea and vomiting)   . Rheumatoid arthritis ([place])    on Humira    Past Surgical History:  Procedure Laterality Date  . BREAST SURGERY Left 2003   Lumpectomy T1C1, NO and negative ER/ PR  . COLONOSCOPY  10/04  . COLONOSCOPY  11/10   normal recheck in 5 years  . COLPORRHAPHY    . ESOPHAGOGASTRODUODENOSCOPY ENDOSCOPY  11/10   GERD  . FOOT SURGERY Left age 22   removal of pseudo rheumatoid nodules from left forefoot.  Marland Kitchen LAPAROSCOPIC BILATERAL SALPINGO OOPHERECTOMY N/A 07/30/2013   Procedure: LAPAROSCOPIC BILATERAL SALPINGO OOPHORECTOMY WITH WASHINGS;  Surgeon: Lyman Speller, MD;  Location: [place] [facility];  Service: Gynecology;  Laterality: N/A;  . NASAL SEPTOPLASTY W/ TURBINOPLASTY Bilateral 05/24/2014   Procedure: NASAL SEPTOPLASTY WITH  BILATERAL TURBINATE REDUCTION;  Surgeon: Jerrell Belfast, MD;  Location: [place];  Service: ENT;  Laterality: Bilateral;  . PELVIC FLOOR REPAIR  3/04   SPARC with AP colporrhaphy  . PILONIDAL CYST EXCISION    . TOTAL VAGINAL HYSTERECTOMY  1989   with AP repair    MEDICATIONS: . Abatacept (ORENCIA IV)  . acetaminophen (TYLENOL) 325 MG tablet  . amoxicillin (AMOXIL) 500 MG capsule  . amphetamine-dextroamphetamine (ADDERALL) 5 MG tablet  . chlorpheniramine (CHLOR-TRIMETON) 4 MG tablet  . diphenhydrAMINE (BENADRYL) 25 mg capsule  . famotidine (PEPCID) 10 MG tablet  . ibuprofen (ADVIL,MOTRIN) 200 MG  tablet  . Liniments (SALONPAS EX)  . Magnesium 250 MG TABS  . mometasone-formoterol (DULERA) 100-5 MCG/ACT AERO  . NONFORMULARY OR COMPOUNDED ITEM  . Omega-3 Fatty Acids (FISH OIL) 1000 MG CAPS  . promethazine (PHENERGAN) 25 MG tablet  . RESTASIS 0.05 % ophthalmic emulsion  . Sodium Fluoride 1.1 % PSTE  . temazepam (RESTORIL) 30 MG capsule  . VENTOLIN HFA 108 (90 BASE) MCG/ACT inhaler  . Vitamin D, Ergocalciferol, (DRISDOL) 50000 units CAPS capsule   No current facility-administered medications for this encounter.     Myra Gianotti, PA-C Surgical Short Stay/Anesthesiology [facility] Phone [phone] [facility] Phone [phone] 03/31/2018 12:55 PM

## 2018-04-03 ENCOUNTER — Encounter: Payer: Self-pay | Admitting: Obstetrics & Gynecology

## 2018-04-06 ENCOUNTER — Encounter (HOSPITAL_COMMUNITY): Payer: Self-pay | Admitting: Certified Registered Nurse Anesthetist

## 2018-04-06 ENCOUNTER — Encounter (HOSPITAL_COMMUNITY)
Admission: RE | Disposition: A | Discharge status: Home / Self Care | Payer: Self-pay | Source: Home / Self Care | Attending: Neurological Surgery

## 2018-04-06 ENCOUNTER — Ambulatory Visit (HOSPITAL_COMMUNITY): Payer: Medicare Other

## 2018-04-06 ENCOUNTER — Ambulatory Visit (HOSPITAL_COMMUNITY): Payer: Medicare Other | Admitting: Physician Assistant

## 2018-04-06 ENCOUNTER — Ambulatory Visit (HOSPITAL_COMMUNITY): Payer: Medicare Other | Admitting: Anesthesiology

## 2018-04-06 ENCOUNTER — Observation Stay (HOSPITAL_COMMUNITY)
Admission: RE | Admit: 2018-04-06 | Discharge: 2018-04-07 | Disposition: A | Discharge status: Home / Self Care | Payer: Medicare Other | Attending: Neurological Surgery | Admitting: Neurological Surgery

## 2018-04-06 DIAGNOSIS — Z8249 Family history of ischemic heart disease and other diseases of the circulatory system: Secondary | ICD-10-CM | POA: Diagnosis not present

## 2018-04-06 DIAGNOSIS — Z881 Allergy status to other antibiotic agents status: Secondary | ICD-10-CM | POA: Insufficient documentation

## 2018-04-06 DIAGNOSIS — Z419 Encounter for procedure for purposes other than remedying health state, unspecified: Secondary | ICD-10-CM

## 2018-04-06 DIAGNOSIS — Z981 Arthrodesis status: Secondary | ICD-10-CM | POA: Diagnosis not present

## 2018-04-06 DIAGNOSIS — Z90710 Acquired absence of both cervix and uterus: Secondary | ICD-10-CM | POA: Diagnosis not present

## 2018-04-06 DIAGNOSIS — M069 Rheumatoid arthritis, unspecified: Secondary | ICD-10-CM | POA: Diagnosis not present

## 2018-04-06 DIAGNOSIS — Z8049 Family history of malignant neoplasm of other genital organs: Secondary | ICD-10-CM | POA: Diagnosis not present

## 2018-04-06 DIAGNOSIS — M48061 Spinal stenosis, lumbar region without neurogenic claudication: Secondary | ICD-10-CM | POA: Diagnosis not present

## 2018-04-06 DIAGNOSIS — Z888 Allergy status to other drugs, medicaments and biological substances status: Secondary | ICD-10-CM | POA: Diagnosis not present

## 2018-04-06 DIAGNOSIS — Z803 Family history of malignant neoplasm of breast: Secondary | ICD-10-CM | POA: Insufficient documentation

## 2018-04-06 DIAGNOSIS — M5116 Intervertebral disc disorders with radiculopathy, lumbar region: Secondary | ICD-10-CM | POA: Diagnosis not present

## 2018-04-06 DIAGNOSIS — Z87891 Personal history of nicotine dependence: Secondary | ICD-10-CM | POA: Diagnosis not present

## 2018-04-06 DIAGNOSIS — Z8052 Family history of malignant neoplasm of bladder: Secondary | ICD-10-CM | POA: Diagnosis not present

## 2018-04-06 DIAGNOSIS — Z7951 Long term (current) use of inhaled steroids: Secondary | ICD-10-CM | POA: Diagnosis not present

## 2018-04-06 DIAGNOSIS — K219 Gastro-esophageal reflux disease without esophagitis: Secondary | ICD-10-CM | POA: Insufficient documentation

## 2018-04-06 DIAGNOSIS — D649 Anemia, unspecified: Secondary | ICD-10-CM | POA: Insufficient documentation

## 2018-04-06 DIAGNOSIS — Z882 Allergy status to sulfonamides status: Secondary | ICD-10-CM | POA: Diagnosis not present

## 2018-04-06 DIAGNOSIS — Z853 Personal history of malignant neoplasm of breast: Secondary | ICD-10-CM | POA: Insufficient documentation

## 2018-04-06 DIAGNOSIS — Z79899 Other long term (current) drug therapy: Secondary | ICD-10-CM | POA: Diagnosis not present

## 2018-04-06 DIAGNOSIS — Z833 Family history of diabetes mellitus: Secondary | ICD-10-CM | POA: Insufficient documentation

## 2018-04-06 DIAGNOSIS — Z9889 Other specified postprocedural states: Secondary | ICD-10-CM

## 2018-04-06 DIAGNOSIS — J45909 Unspecified asthma, uncomplicated: Secondary | ICD-10-CM | POA: Diagnosis not present

## 2018-04-06 DIAGNOSIS — Z800 Family history of malignant neoplasm of digestive organs: Secondary | ICD-10-CM | POA: Diagnosis not present

## 2018-04-06 DIAGNOSIS — I491 Atrial premature depolarization: Secondary | ICD-10-CM | POA: Insufficient documentation

## 2018-04-06 DIAGNOSIS — Z791 Long term (current) use of non-steroidal anti-inflammatories (NSAID): Secondary | ICD-10-CM | POA: Diagnosis not present

## 2018-04-06 HISTORY — PX: LUMBAR LAMINECTOMY/DECOMPRESSION MICRODISCECTOMY: SHX5026

## 2018-04-06 SURGERY — LUMBAR LAMINECTOMY/DECOMPRESSION MICRODISCECTOMY 1 LEVEL
Anesthesia: General | Site: Back | Laterality: Right

## 2018-04-06 MED ORDER — SODIUM CHLORIDE 0.9 % IV SOLN
INTRAVENOUS | Status: DC | PRN
  Administered 2018-04-06: 09:00:00

## 2018-04-06 MED ORDER — THROMBIN 5000 UNITS EX SOLR
CUTANEOUS | Status: AC
  Filled 2018-04-06: qty 5000

## 2018-04-06 MED ORDER — HYDROMORPHONE HCL 1 MG/ML IJ SOLN
0.2500 mg | INTRAMUSCULAR | Status: DC | PRN
  Administered 2018-04-06: 0.5 mg via INTRAVENOUS

## 2018-04-06 MED ORDER — ACETAMINOPHEN 325 MG PO TABS: 650.0000 mg | ORAL_TABLET | ORAL | Status: DC | PRN

## 2018-04-06 MED ORDER — DEXAMETHASONE 4 MG PO TABS
4.0000 mg | ORAL_TABLET | Freq: Four times a day (QID) | ORAL | Status: DC
  Administered 2018-04-06 – 2018-04-07 (×3): 4 mg via ORAL
  Filled 2018-04-06 (×3): qty 1

## 2018-04-06 MED ORDER — SODIUM CHLORIDE 0.9% FLUSH: 3.0000 mL | Freq: Two times a day (BID) | INTRAVENOUS | Status: DC

## 2018-04-06 MED ORDER — MOMETASONE FURO-FORMOTEROL FUM 100-5 MCG/ACT IN AERO
1.0000 | INHALATION_SPRAY | Freq: Every day | RESPIRATORY_TRACT | Status: DC
  Filled 2018-04-06: qty 8.8

## 2018-04-06 MED ORDER — SUCCINYLCHOLINE CHLORIDE 200 MG/10ML IV SOSY
PREFILLED_SYRINGE | INTRAVENOUS | Status: AC
  Filled 2018-04-06: qty 10

## 2018-04-06 MED ORDER — HYDROCODONE-ACETAMINOPHEN 7.5-325 MG PO TABS
ORAL_TABLET | ORAL | Status: AC
  Administered 2018-04-06: 1 via ORAL
  Filled 2018-04-06: qty 1

## 2018-04-06 MED ORDER — ONDANSETRON HCL 4 MG/2ML IJ SOLN
INTRAMUSCULAR | Status: AC
  Filled 2018-04-06: qty 2

## 2018-04-06 MED ORDER — PROPOFOL 10 MG/ML IV BOLUS
INTRAVENOUS | Status: DC | PRN
  Administered 2018-04-06: 140 mg via INTRAVENOUS

## 2018-04-06 MED ORDER — ONDANSETRON HCL 4 MG PO TABS
4.0000 mg | ORAL_TABLET | Freq: Four times a day (QID) | ORAL | Status: DC | PRN
  Administered 2018-04-06 – 2018-04-07 (×3): 4 mg via ORAL
  Filled 2018-04-06 (×3): qty 1

## 2018-04-06 MED ORDER — THROMBIN 5000 UNITS EX SOLR
OROMUCOSAL | Status: DC | PRN
  Administered 2018-04-06: 09:00:00 via TOPICAL

## 2018-04-06 MED ORDER — BUPIVACAINE HCL (PF) 0.25 % IJ SOLN
INTRAMUSCULAR | Status: DC | PRN
  Administered 2018-04-06: 5 mL

## 2018-04-06 MED ORDER — HYDROMORPHONE HCL 1 MG/ML IJ SOLN
INTRAMUSCULAR | Status: AC
  Filled 2018-04-06: qty 1

## 2018-04-06 MED ORDER — MIDAZOLAM HCL 5 MG/5ML IJ SOLN
INTRAMUSCULAR | Status: DC | PRN
  Administered 2018-04-06: 2 mg via INTRAVENOUS

## 2018-04-06 MED ORDER — ROCURONIUM BROMIDE 50 MG/5ML IV SOSY
PREFILLED_SYRINGE | INTRAVENOUS | Status: AC
  Filled 2018-04-06: qty 5

## 2018-04-06 MED ORDER — FENTANYL CITRATE (PF) 100 MCG/2ML IJ SOLN
INTRAMUSCULAR | Status: DC | PRN
  Administered 2018-04-06: 150 ug via INTRAVENOUS

## 2018-04-06 MED ORDER — HYDROCODONE-ACETAMINOPHEN 7.5-325 MG PO TABS
1.0000 | ORAL_TABLET | Freq: Four times a day (QID) | ORAL | Status: DC
  Administered 2018-04-06: 1 via ORAL

## 2018-04-06 MED ORDER — LACTATED RINGERS IV SOLN
INTRAVENOUS | Status: DC | PRN
  Administered 2018-04-06 (×2): via INTRAVENOUS

## 2018-04-06 MED ORDER — SENNA 8.6 MG PO TABS
1.0000 | ORAL_TABLET | Freq: Two times a day (BID) | ORAL | Status: DC
  Administered 2018-04-06 (×2): 8.6 mg via ORAL
  Filled 2018-04-06 (×2): qty 1

## 2018-04-06 MED ORDER — POTASSIUM CHLORIDE IN NACL 20-0.9 MEQ/L-% IV SOLN: INTRAVENOUS | Status: DC

## 2018-04-06 MED ORDER — FENTANYL CITRATE (PF) 250 MCG/5ML IJ SOLN
INTRAMUSCULAR | Status: AC
  Filled 2018-04-06: qty 5

## 2018-04-06 MED ORDER — 0.9 % SODIUM CHLORIDE (POUR BTL) OPTIME
TOPICAL | Status: DC | PRN
  Administered 2018-04-06: 1000 mL

## 2018-04-06 MED ORDER — METHOCARBAMOL 500 MG PO TABS
ORAL_TABLET | ORAL | Status: AC
  Administered 2018-04-06: 500 mg via ORAL
  Filled 2018-04-06: qty 1

## 2018-04-06 MED ORDER — CEFAZOLIN SODIUM-DEXTROSE 2-3 GM-%(50ML) IV SOLR
INTRAVENOUS | Status: DC | PRN
  Administered 2018-04-06: 2 g via INTRAVENOUS

## 2018-04-06 MED ORDER — PROMETHAZINE HCL 25 MG/ML IJ SOLN
6.2500 mg | INTRAMUSCULAR | Status: DC | PRN
  Administered 2018-04-06: 6.25 mg via INTRAVENOUS

## 2018-04-06 MED ORDER — MEPERIDINE HCL 50 MG/ML IJ SOLN: 6.2500 mg | INTRAMUSCULAR | Status: DC | PRN

## 2018-04-06 MED ORDER — ONDANSETRON HCL 4 MG/2ML IJ SOLN: 4.0000 mg | Freq: Four times a day (QID) | INTRAMUSCULAR | Status: DC | PRN

## 2018-04-06 MED ORDER — DEXAMETHASONE SODIUM PHOSPHATE 10 MG/ML IJ SOLN
INTRAMUSCULAR | Status: DC | PRN
  Administered 2018-04-06: 10 mg via INTRAVENOUS

## 2018-04-06 MED ORDER — LIDOCAINE 2% (20 MG/ML) 5 ML SYRINGE
INTRAMUSCULAR | Status: DC | PRN
  Administered 2018-04-06: 100 mg via INTRAVENOUS

## 2018-04-06 MED ORDER — PROPOFOL 10 MG/ML IV BOLUS
INTRAVENOUS | Status: AC
  Filled 2018-04-06: qty 20

## 2018-04-06 MED ORDER — HYDROCODONE-ACETAMINOPHEN 5-325 MG PO TABS
1.0000 | ORAL_TABLET | ORAL | Status: DC | PRN
  Administered 2018-04-06 – 2018-04-07 (×4): 2 via ORAL
  Filled 2018-04-06 (×4): qty 2

## 2018-04-06 MED ORDER — CEFAZOLIN SODIUM-DEXTROSE 2-4 GM/100ML-% IV SOLN
INTRAVENOUS | Status: AC
  Filled 2018-04-06: qty 100

## 2018-04-06 MED ORDER — SODIUM CHLORIDE 0.9 % IV SOLN: 250.0000 mL | INTRAVENOUS | Status: DC

## 2018-04-06 MED ORDER — CYCLOSPORINE 0.05 % OP EMUL
1.0000 [drp] | Freq: Every morning | OPHTHALMIC | Status: DC
  Filled 2018-04-06 (×2): qty 30

## 2018-04-06 MED ORDER — SODIUM CHLORIDE 0.9% FLUSH: 3.0000 mL | INTRAVENOUS | Status: DC | PRN

## 2018-04-06 MED ORDER — MIDAZOLAM HCL 2 MG/2ML IJ SOLN
INTRAMUSCULAR | Status: AC
  Filled 2018-04-06: qty 2

## 2018-04-06 MED ORDER — ACETAMINOPHEN 650 MG RE SUPP: 650.0000 mg | RECTAL | Status: DC | PRN

## 2018-04-06 MED ORDER — SUGAMMADEX SODIUM 200 MG/2ML IV SOLN
INTRAVENOUS | Status: DC | PRN
  Administered 2018-04-06: 200 mg via INTRAVENOUS

## 2018-04-06 MED ORDER — ALBUTEROL SULFATE (2.5 MG/3ML) 0.083% IN NEBU: 3.0000 mL | INHALATION_SOLUTION | RESPIRATORY_TRACT | Status: DC | PRN

## 2018-04-06 MED ORDER — PROMETHAZINE HCL 25 MG/ML IJ SOLN
INTRAMUSCULAR | Status: AC
  Administered 2018-04-06: 6.25 mg via INTRAVENOUS
  Filled 2018-04-06: qty 1

## 2018-04-06 MED ORDER — METHOCARBAMOL 500 MG PO TABS
500.0000 mg | ORAL_TABLET | Freq: Four times a day (QID) | ORAL | Status: DC | PRN
  Administered 2018-04-06 – 2018-04-07 (×3): 500 mg via ORAL
  Filled 2018-04-06 (×2): qty 1

## 2018-04-06 MED ORDER — MORPHINE SULFATE (PF) 2 MG/ML IV SOLN: 2.0000 mg | INTRAVENOUS | Status: DC | PRN

## 2018-04-06 MED ORDER — BUPIVACAINE HCL (PF) 0.25 % IJ SOLN
INTRAMUSCULAR | Status: AC
  Filled 2018-04-06: qty 30

## 2018-04-06 MED ORDER — ROCURONIUM BROMIDE 10 MG/ML (PF) SYRINGE
PREFILLED_SYRINGE | INTRAVENOUS | Status: DC | PRN
  Administered 2018-04-06: 10 mg via INTRAVENOUS
  Administered 2018-04-06: 50 mg via INTRAVENOUS

## 2018-04-06 MED ORDER — CEFAZOLIN SODIUM-DEXTROSE 2-4 GM/100ML-% IV SOLN
2.0000 g | Freq: Three times a day (TID) | INTRAVENOUS | Status: AC
  Administered 2018-04-06 (×2): 2 g via INTRAVENOUS
  Filled 2018-04-06 (×2): qty 100

## 2018-04-06 MED ORDER — AMPHETAMINE-DEXTROAMPHETAMINE 5 MG PO TABS: 10.0000 mg | ORAL_TABLET | Freq: Every day | ORAL | Status: DC | PRN

## 2018-04-06 MED ORDER — MENTHOL 3 MG MT LOZG: 1.0000 | LOZENGE | OROMUCOSAL | Status: DC | PRN

## 2018-04-06 MED ORDER — PHENOL 1.4 % MT LIQD: 1.0000 | OROMUCOSAL | Status: DC | PRN

## 2018-04-06 MED ORDER — ONDANSETRON HCL 4 MG/2ML IJ SOLN
INTRAMUSCULAR | Status: DC | PRN
  Administered 2018-04-06: 4 mg via INTRAVENOUS

## 2018-04-06 MED ORDER — DEXAMETHASONE SODIUM PHOSPHATE 4 MG/ML IJ SOLN
4.0000 mg | Freq: Four times a day (QID) | INTRAMUSCULAR | Status: DC
  Administered 2018-04-06: 4 mg via INTRAVENOUS
  Filled 2018-04-06: qty 1

## 2018-04-06 MED ORDER — METHOCARBAMOL 1000 MG/10ML IJ SOLN
500.0000 mg | Freq: Four times a day (QID) | INTRAVENOUS | Status: DC | PRN
  Filled 2018-04-06: qty 5

## 2018-04-06 SURGICAL SUPPLY — 50 items
APL SKNCLS STERI-STRIP NONHPOA (GAUZE/BANDAGES/DRESSINGS) ×1
BAG DECANTER FOR FLEXI CONT (MISCELLANEOUS) ×2 IMPLANT
BENZOIN TINCTURE PRP APPL 2/3 (GAUZE/BANDAGES/DRESSINGS) ×2 IMPLANT
BUR MATCHSTICK NEURO 3.0 LAGG (BURR) ×2 IMPLANT
CANISTER SUCT 3000ML PPV (MISCELLANEOUS) ×2 IMPLANT
CARTRIDGE OIL MAESTRO DRILL (MISCELLANEOUS) ×1 IMPLANT
COVER WAND RF STERILE (DRAPES) ×1 IMPLANT
DIFFUSER DRILL AIR PNEUMATIC (MISCELLANEOUS) ×2 IMPLANT
DRAPE LAPAROTOMY 100X72X124 (DRAPES) ×2 IMPLANT
DRAPE MICROSCOPE LEICA (MISCELLANEOUS) ×2 IMPLANT
DRAPE POUCH INSTRU U-SHP 10X18 (DRAPES) ×2 IMPLANT
DRAPE SURG 17X23 STRL (DRAPES) ×2 IMPLANT
DRSG OPSITE POSTOP 3X4 (GAUZE/BANDAGES/DRESSINGS) ×1 IMPLANT
DURAPREP 26ML APPLICATOR (WOUND CARE) ×2 IMPLANT
ELECT REM PT RETURN 9FT ADLT (ELECTROSURGICAL) ×2
ELECTRODE REM PT RTRN 9FT ADLT (ELECTROSURGICAL) ×1 IMPLANT
GAUZE 4X4 16PLY RFD (DISPOSABLE) IMPLANT
GLOVE BIO SURGEON STRL SZ7 (GLOVE) IMPLANT
GLOVE BIO SURGEON STRL SZ7.5 (GLOVE) ×1 IMPLANT
GLOVE BIO SURGEON STRL SZ8 (GLOVE) ×2 IMPLANT
GLOVE BIOGEL PI IND STRL 7.0 (GLOVE) IMPLANT
GLOVE BIOGEL PI IND STRL 7.5 (GLOVE) IMPLANT
GLOVE BIOGEL PI INDICATOR 7.0 (GLOVE)
GLOVE BIOGEL PI INDICATOR 7.5 (GLOVE) ×1
GOWN STRL REUS W/ TWL LRG LVL3 (GOWN DISPOSABLE) IMPLANT
GOWN STRL REUS W/ TWL XL LVL3 (GOWN DISPOSABLE) ×1 IMPLANT
GOWN STRL REUS W/TWL 2XL LVL3 (GOWN DISPOSABLE) IMPLANT
GOWN STRL REUS W/TWL LRG LVL3 (GOWN DISPOSABLE)
GOWN STRL REUS W/TWL XL LVL3 (GOWN DISPOSABLE) ×2
HEMOSTAT POWDER KIT SURGIFOAM (HEMOSTASIS) ×2 IMPLANT
KIT BASIN OR (CUSTOM PROCEDURE TRAY) ×2 IMPLANT
KIT TURNOVER KIT B (KITS) ×2 IMPLANT
NDL HYPO 25X1 1.5 SAFETY (NEEDLE) ×1 IMPLANT
NDL SPNL 20GX3.5 QUINCKE YW (NEEDLE) IMPLANT
NEEDLE HYPO 25X1 1.5 SAFETY (NEEDLE) ×2 IMPLANT
NEEDLE SPNL 20GX3.5 QUINCKE YW (NEEDLE) ×2 IMPLANT
NS IRRIG 1000ML POUR BTL (IV SOLUTION) ×2 IMPLANT
OIL CARTRIDGE MAESTRO DRILL (MISCELLANEOUS) ×2
PACK LAMINECTOMY NEURO (CUSTOM PROCEDURE TRAY) ×2 IMPLANT
PAD ARMBOARD 7.5X6 YLW CONV (MISCELLANEOUS) ×6 IMPLANT
RUBBERBAND STERILE (MISCELLANEOUS) ×4 IMPLANT
SPONGE SURGIFOAM ABS GEL SZ50 (HEMOSTASIS) IMPLANT
STRIP CLOSURE SKIN 1/2X4 (GAUZE/BANDAGES/DRESSINGS) ×2 IMPLANT
SUT VIC AB 0 CT1 18XCR BRD8 (SUTURE) ×1 IMPLANT
SUT VIC AB 0 CT1 8-18 (SUTURE) ×2
SUT VIC AB 2-0 CP2 18 (SUTURE) ×2 IMPLANT
SUT VIC AB 3-0 SH 8-18 (SUTURE) ×3 IMPLANT
TOWEL GREEN STERILE (TOWEL DISPOSABLE) ×2 IMPLANT
TOWEL GREEN STERILE FF (TOWEL DISPOSABLE) ×2 IMPLANT
WATER STERILE IRR 1000ML POUR (IV SOLUTION) ×2 IMPLANT

## 2018-04-06 NOTE — Anesthesia Procedure Notes (Addendum)
Procedure Name: Intubation Date/Time: 04/06/2018 7:52 AM Performed by: Carney Living, CRNA Pre-anesthesia Checklist: Patient identified, Emergency Drugs available, Suction available, Patient being monitored and Timeout performed Patient Re-evaluated:Patient Re-evaluated prior to induction Oxygen Delivery Method: Circle system utilized Preoxygenation: Pre-oxygenation with 100% oxygen Induction Type: IV induction Ventilation: Mask ventilation without difficulty Laryngoscope Size: Glidescope and 4 Grade View: Grade I Tube type: Oral Tube size: 7.0 mm Number of attempts: 1 Airway Equipment and Method: Stylet and Video-laryngoscopy Placement Confirmation: ETT inserted through vocal cords under direct vision,  positive ETCO2 and breath sounds checked- equal and bilateral Secured at: 21 cm Tube secured with: Tape Dental Injury: Teeth and Oropharynx as per pre-operative assessment  Comments: Neck neutral throughout DL and ETT placement, VSS

## 2018-04-06 NOTE — Anesthesia Postprocedure Evaluation (Signed)
Anesthesia Post Note  [name]  Procedure(s) Performed: Extraforaminal Microdiscectomy - Lumbar Two-Lumbar Three - right (Right Back)     Patient location during evaluation: PACU Anesthesia Type: General Level of consciousness: sedated and patient cooperative Pain management: pain level controlled Vital Signs Assessment: post-procedure vital signs reviewed and stable Respiratory status: spontaneous breathing Cardiovascular status: stable Anesthetic complications: no    Last Vitals:  Vitals:   04/06/18 1124 04/06/18 1441  BP: (!) 107/51 (!) 105/44  Pulse: 79 74  Resp: 16 19  Temp: 36.8 C 37.2 C  SpO2: 96% 95%    Last Pain:  Vitals:   04/06/18 1505  TempSrc:   PainSc: [place]

## 2018-04-06 NOTE — H&P (Signed)
Subjective: Patient is a 68 y.o. female admitted for leg pain. Onset of symptoms was several months ago, gradually worsening since that time.  The pain is rated severe, and is located at the across the lower back and radiates to RLE. The pain is described as aching and occurs all day. The symptoms have been progressive. Symptoms are exacerbated by exercise. MRI or CT showed HNP   Past Medical History:  Diagnosis Date  . Anemia   . Asthma    inhaler one x per day  . Cancer [facility])    breast, left  . Chiari malformation   . Difficult intubation    cannot hyperextend neck due to Chiari malformation with tonsillar involvement, RA  . Dysrhythmia 2015   history of PAC's  . GERD (gastroesophageal reflux disease)   . PONV (postoperative nausea and vomiting)   . Rheumatoid arthritis ([place])    on Humira    Past Surgical History:  Procedure Laterality Date  . BREAST SURGERY Left 2003   Lumpectomy T1C1, NO and negative ER/ PR  . COLONOSCOPY  10/04  . COLONOSCOPY  11/10   normal recheck in 5 years  . COLPORRHAPHY    . ESOPHAGOGASTRODUODENOSCOPY ENDOSCOPY  11/10   GERD  . FOOT SURGERY Left age 85   removal of pseudo rheumatoid nodules from left forefoot.  Marland Kitchen LAPAROSCOPIC BILATERAL SALPINGO OOPHERECTOMY N/A 07/30/2013   Procedure: LAPAROSCOPIC BILATERAL SALPINGO OOPHORECTOMY WITH WASHINGS;  Surgeon: Lyman Speller, MD;  Location: [facility];  Service: Gynecology;  Laterality: N/A;  . NASAL SEPTOPLASTY W/ TURBINOPLASTY Bilateral 05/24/2014   Procedure: NASAL SEPTOPLASTY WITH  BILATERAL TURBINATE REDUCTION;  Surgeon: Jerrell Belfast, MD;  Location: [place];  Service: ENT;  Laterality: Bilateral;  . PELVIC FLOOR REPAIR  3/04   SPARC with AP colporrhaphy  . PILONIDAL CYST EXCISION    . TOTAL VAGINAL HYSTERECTOMY  1989   with AP repair    Prior to Admission medications   Medication Sig Start Date End Date Taking? Authorizing Provider  Abatacept (ORENCIA IV) Inject 500 mg into the vein every 30  (thirty) days. Infuse 2 vials / 500 mg over 30 minutes every 4 weeks   Yes Deveshwar, Abel Presto, MD  acetaminophen (TYLENOL) 325 MG tablet Take 650 mg by mouth daily as needed (One hour prior to infusion).    Yes [provider]  chlorpheniramine (CHLOR-TRIMETON) 4 MG tablet Take 4 mg by mouth daily as needed for allergies.   Yes [provider]  famotidine (PEPCID) 10 MG tablet Take 10 mg by mouth 2 (two) times daily as needed for heartburn or indigestion.    Yes [provider]  ibuprofen (ADVIL,MOTRIN) 200 MG tablet Take 400 mg by mouth every 6 (six) hours as needed (for inflammation).   Yes [provider]  Liniments (SALONPAS EX) Apply topically as needed.   Yes [provider]  Magnesium 250 MG TABS Take 500-1,000 tablets by mouth daily.    Yes [provider]  mometasone-formoterol (DULERA) 100-5 MCG/ACT AERO Inhale 1 puff into the lungs daily.    Yes [provider]  NONFORMULARY OR COMPOUNDED ITEM Estradiol 0.02% vaginal cream.  Place 1/2 gram vaginally two to three times weekly at bedtime. Disp:  3 month supply Patient taking differently: Place 0.5 Applicatorfuls vaginally once a week. Estradiol 0.02% vaginal cream.  Place 1/2 gram vaginally two to three times weekly at bedtime. Disp:  3 month supply 07/08/17  Yes Megan Salon, MD  Omega-3 Fatty Acids ([place]  OIL) 1000 MG CAPS Take 1,000 mg by mouth daily.   Yes [provider]  RESTASIS 0.05 % ophthalmic emulsion Place 1 drop into both eyes every morning.  12/13/12  Yes [provider]  Sodium Fluoride 1.1 % PSTE Place 1 application onto teeth 2 (two) times daily.  12/30/15  Yes [provider]  temazepam (RESTORIL) 30 MG capsule Take 30 mg by mouth at bedtime.  12/18/12  Yes [provider]  VENTOLIN HFA 108 (90 BASE) MCG/ACT inhaler Inhale 2 puffs into the lungs every 4 (four) hours as needed for wheezing or shortness of breath.  12/13/12  Yes  [provider]  Vitamin D, Ergocalciferol, (DRISDOL) 50000 units CAPS capsule Take 1 capsule (50,000 Units total) by mouth every 7 (seven) days. 07/04/17  Yes Megan Salon, MD  amoxicillin (AMOXIL) 500 MG capsule Take 2,000 mg by mouth See admin instructions. Take 2000 mg by mouth 1 hour prior to dental appointment 06/08/17   [provider]  amphetamine-dextroamphetamine (ADDERALL) 5 MG tablet Take 10 mg by mouth daily as needed (stress).  03/11/14   [provider]  diphenhydrAMINE (BENADRYL) 25 mg capsule Take 25 mg by mouth daily as needed (One hour prior to infusion).     [provider]  promethazine (PHENERGAN) 25 MG tablet Take 25 mg by mouth every 6 (six) hours as needed for nausea or vomiting.    [provider]   Allergies  Allergen Reactions  . Fish Allergy Anaphylaxis  . Iodine Anaphylaxis    " PER PT IV CONTRAST"  . Erythromycin     UNSPECIFIED REACTION   . Sulfonamide Derivatives Other (See Comments)    Tongue turns black.    Social History   Tobacco Use  . Smoking status: Former Smoker    Types: Cigarettes    Last attempt to quit: 02/16/1999    Years since quitting: 19.1  . Smokeless tobacco: Never Used  Substance Use Topics  . Alcohol use: No    Family History  Problem Relation Age of Onset  . Diabetes Mother   . Heart failure Mother   . Bladder Cancer Mother   . Stroke Mother   . Cancer Father        head and neck  . Uterine cancer Maternal Aunt   . Bladder Cancer Maternal Grandmother   . Colon cancer Maternal Grandmother      Review of Systems  Positive ROS: neg  All other systems have been reviewed and were otherwise negative with the exception of those mentioned in the HPI and as above.  Objective: Vital signs in last 24 hours: Temp:  [98 F (36.7 C)] 98 F (36.7 C) (02/20 0551) Pulse Rate:  [66] 66 (02/20 0551) Resp:  [17] 17 (02/20 0551) BP: (112)/(67) 112/67 (02/20 0551) SpO2:  [98 %] 98 % (02/20  0551)  General Appearance: Alert, cooperative, no distress, appears stated age Head: Normocephalic, without obvious abnormality, atraumatic Eyes: PERRL, conjunctiva/corneas clear, EOM's intact    Neck: Supple, symmetrical, trachea midline Back: Symmetric, no curvature, ROM normal, no CVA tenderness Lungs:  respirations unlabored Heart: Regular rate and rhythm Abdomen: Soft, non-tender Extremities: Extremities normal, atraumatic, no cyanosis or edema Pulses: 2+ and symmetric all extremities Skin: Skin color, texture, turgor normal, no rashes or lesions  NEUROLOGIC:   Mental status: Alert and oriented x4,  no aphasia, good attention span, fund of knowledge, and memory Motor Exam - grossly normal Sensory Exam - grossly normal Reflexes: 1+  Coordination - grossly normal Gait - grossly normal Balance - grossly normal Cranial Nerves: I: smell Not tested  II: visual acuity  OS: nl    OD: nl  II: visual fields Full to confrontation  II: pupils Equal, round, reactive to light  III,VII: ptosis None  III,IV,VI: extraocular muscles  Full ROM  V: mastication Normal  V: facial light touch sensation  Normal  V,VII: corneal reflex  Present  VII: facial muscle function - upper  Normal  VII: facial muscle function - lower Normal  VIII: hearing Not tested  IX: soft palate elevation  Normal  IX,X: gag reflex Present  XI: trapezius strength  5/5  XI: sternocleidomastoid strength 5/5  XI: neck flexion strength  5/5  XII: tongue strength  Normal    Data Review Lab Results  Component Value Date   WBC 7.9 03/30/2018   HGB 12.7 03/30/2018   HCT 39.3 03/30/2018   MCV 86.8 03/30/2018   PLT 367 03/30/2018   Lab Results  Component Value Date   NA 140 03/30/2018   K 3.7 03/30/2018   CL 107 03/30/2018   CO2 23 03/30/2018   BUN 11 03/30/2018   CREATININE 0.51 03/30/2018   GLUCOSE 91 03/30/2018   No results found for: INR, PROTIME  Assessment/Plan:  Estimated body mass index is 22.99  kg/m as calculated from the following:   Height as of 03/30/18: 5\' 2"  (1.575 m).   Weight as of 03/30/18: 57 kg. Patient admitted for R L2-3 extraforaminal microdiskectomy. Patient has failed a reasonable attempt at conservative therapy.  I explained the condition and procedure to the patient and answered any questions.  Patient wishes to proceed with procedure as planned. Understands risks/ benefits and typical outcomes of procedure.   Eustace Cabacungan 04/06/2018 7:01 AM

## 2018-04-06 NOTE — Op Note (Signed)
04/06/2018  9:11 AM  PATIENT:  Kristy Perez  68 y.o. female  PRE-OPERATIVE DIAGNOSIS: Extraforaminal disc herniation L2-3 on the right with right L2 radiculopathy  POST-OPERATIVE DIAGNOSIS:  same  PROCEDURE: Right L2-3 extraforaminal microdiscectomy utilizing microscopic dissection  SURGEON:  Sherley Bounds, MD  ASSISTANTS: [place] MD  ANESTHESIA:   General  EBL: 50 ml  Total I/O In: -  Out: 50 [Blood:50]  BLOOD ADMINISTERED: none  DRAINS: None  SPECIMEN:  none  INDICATION FOR PROCEDURE: This patient presented with right anterior thigh pain for quite a long time. Imaging showed progressive foraminal stenosis L2-3 on the right with disc herniation. The patient tried conservative measures without relief. Pain was debilitating. Recommended right L2-3 extraforaminal decompression and microdiscectomy. Patient understood the risks, benefits, and alternatives and potential outcomes and wished to proceed.  PROCEDURE DETAILS: The patient was taken to the operating room and after induction of adequate generalized endotracheal anesthesia, the patient was rolled into the prone position on the Wilson frame and all pressure points were padded. The lumbar region was cleaned and then prepped with DuraPrep and draped in the usual sterile fashion. 5 cc of local anesthesia was injected and then a dorsal midline incision was made and carried down to the lumbo sacral fascia. The fascia was opened and the paraspinous musculature was taken down in a subperiosteal fashion to expose L2-3 extraforaminal space on the right. Intraoperative x-ray confirmed my level, and then I used a combination of the high-speed drill and the Kerrison punches to perform an extraforaminal decompression at L2 3 on the right.  Drilled the lateral part of the pars and superior part of the facet.  The underlying yellow ligament was opened and removed in a piecemeal fashion to expose the  exiting nerve root.  The nerve root was well  decompressed. We then gently retracted the nerve root with a retractor, coagulated the epidural venous vasculature, and incised the disc space. I performed a thorough intradiscal discectomy with pituitary rongeurs and curettes, until I had a nice decompression of the nerve root. I then palpated with a coronary dilator along the nerve root and into the foramen to assure adequate decompression. I felt no more compression of the nerve root. I irrigated with saline solution containing bacitracin. Achieved hemostasis with bipolar cautery, and then closed the fascia with 0 Vicryl. I closed the subcutaneous tissues with 2-0 Vicryl and the subcuticular tissues with 3-0 Vicryl. The skin was then closed with benzoin and Steri-Strips. The drapes were removed, a sterile dressing was applied. The patient was awakened from general anesthesia and transferred to the recovery room in stable condition. At the end of the procedure all sponge, needle and instrument counts were correct.    PLAN OF CARE: Admit for overnight observation  PATIENT DISPOSITION:  PACU - hemodynamically stable.   Delay start of Pharmacological VTE agent (>24hrs) due to surgical blood loss or risk of bleeding:  yes

## 2018-04-06 NOTE — Transfer of Care (Signed)
Immediate Anesthesia Transfer of Care Note  [name]  Procedure(s) Performed: Extraforaminal Microdiscectomy - Lumbar Two-Lumbar Three - right (Right Back)  Patient Location: PACU  Anesthesia Type:General  Level of Consciousness: awake, alert , oriented and patient cooperative  Airway & Oxygen Therapy: Patient Spontanous Breathing and Patient connected to nasal cannula oxygen  Post-op Assessment: Report given to RN, Post -op Vital signs reviewed and stable and Patient moving all extremities X 4  Post vital signs: Reviewed and stable  Last Vitals:  Vitals Value Taken Time  BP    Temp    Pulse 109 04/06/2018  9:25 AM  Resp    SpO2 100 % 04/06/2018  9:25 AM  Vitals shown include unvalidated device data.  Last Pain:  Vitals:   04/06/18 0551  TempSrc: Oral         Complications: No apparent anesthesia complications

## 2018-04-07 ENCOUNTER — Encounter (HOSPITAL_COMMUNITY): Payer: Self-pay | Admitting: Neurological Surgery

## 2018-04-07 DIAGNOSIS — D649 Anemia, unspecified: Secondary | ICD-10-CM | POA: Diagnosis not present

## 2018-04-07 DIAGNOSIS — M48061 Spinal stenosis, lumbar region without neurogenic claudication: Secondary | ICD-10-CM | POA: Diagnosis not present

## 2018-04-07 DIAGNOSIS — M5116 Intervertebral disc disorders with radiculopathy, lumbar region: Secondary | ICD-10-CM | POA: Diagnosis not present

## 2018-04-07 DIAGNOSIS — I491 Atrial premature depolarization: Secondary | ICD-10-CM | POA: Diagnosis not present

## 2018-04-07 DIAGNOSIS — J45909 Unspecified asthma, uncomplicated: Secondary | ICD-10-CM | POA: Diagnosis not present

## 2018-04-07 DIAGNOSIS — Z853 Personal history of malignant neoplasm of breast: Secondary | ICD-10-CM | POA: Diagnosis not present

## 2018-04-07 MED ORDER — TAPENTADOL HCL 50 MG PO TABS: 50.0000 mg | ORAL_TABLET | Freq: Four times a day (QID) | ORAL | 0 refills | Status: DC | PRN

## 2018-04-07 MED ORDER — METHOCARBAMOL 500 MG PO TABS: 500.0000 mg | ORAL_TABLET | Freq: Four times a day (QID) | ORAL | 1 refills | Status: DC | PRN

## 2018-04-07 MED ORDER — TAPENTADOL HCL 50 MG PO TABS: 50.0000 mg | ORAL_TABLET | Freq: Four times a day (QID) | ORAL | Status: DC | PRN

## 2018-04-07 NOTE — Discharge Summary (Signed)
Physician Discharge Summary  Patient ID: Kristy Perez MRN: [id number] DOB/AGE: Jul 23, 1950 68 y.o.  Admit date: 04/06/2018 Discharge date: 04/07/2018  Admission Diagnoses: HNP l2-3    Discharge Diagnoses: same   Discharged Condition: good  Hospital Course: The patient was admitted on 04/06/2018 and taken to the operating room where the patient underwent diskectomy L2-3. The patient tolerated the procedure well and was taken to the recovery room and then to the floor in stable condition. The hospital course was routine. There were no complications. The wound remained clean dry and intact. Pt had appropriate back soreness. No complaints of leg pain or new N/T/W. The patient remained afebrile with stable vital signs, and tolerated a regular diet. The patient continued to increase activities, and pain was well controlled with oral pain medications.   Consults: None  Significant Diagnostic Studies:  Results for orders placed or performed during the hospital encounter of 03/30/18  Surgical pcr screen  Result Value Ref Range   MRSA, PCR NEGATIVE NEGATIVE   Staphylococcus aureus NEGATIVE NEGATIVE  Basic metabolic panel  Result Value Ref Range   Sodium 140 135 - 145 mmol/L   Potassium 3.7 3.5 - 5.1 mmol/L   Chloride 107 98 - 111 mmol/L   CO2 23 22 - 32 mmol/L   Glucose, Bld 91 70 - 99 mg/dL   BUN 11 8 - 23 mg/dL   Creatinine, Ser 0.51 0.44 - 1.00 mg/dL   Calcium 9.0 8.9 - 10.3 mg/dL   GFR calc non Af Amer >60 >60 mL/min   GFR calc Af Amer >60 >60 mL/min   Anion gap 10 5 - 15  CBC  Result Value Ref Range   WBC 7.9 4.0 - 10.5 K/uL   RBC 4.53 3.87 - 5.11 MIL/uL   Hemoglobin 12.7 12.0 - 15.0 g/dL   HCT 39.3 36.0 - 46.0 %   MCV 86.8 80.0 - 100.0 fL   MCH 28.0 26.0 - 34.0 pg   MCHC 32.3 30.0 - 36.0 g/dL   RDW 12.6 11.5 - 15.5 %   Platelets 367 150 - 400 K/uL   nRBC 0.0 0.0 - 0.2 %    Dg Lumbar Spine 2-3 Views  Result Date: 04/06/2018 CLINICAL DATA:  Right L2-3 extraforaminal  micro discectomy. EXAM: LUMBAR SPINE - 2-3 VIEW COMPARISON:  01/23/2018.  Lumbar spine MR dated 01/04/2018. FINDINGS: The patient was previously shown to have 5 non-rib-bearing lumbar vertebrae with the last open disc space at the L5-S1 level. Grade 1-2 anterolisthesis at that level with disc space narrowing is unchanged. There is a metallic localizer with its tip projected posterior to the facets at the L1-2 level. IMPRESSION: Localizer at the L1-2 level. Electronically Signed   By: Claudie Revering M.D.   On: 04/06/2018 09:25    Antibiotics:  Anti-infectives (From admission, onward)   Start     Dose/Rate Route Frequency Ordered Stop   04/06/18 1130  ceFAZolin (ANCEF) IVPB 2g/100 mL premix     2 g 200 mL/hr over 30 Minutes Intravenous Every 8 hours 04/06/18 1116 04/06/18 1930   04/06/18 0834  bacitracin 50,000 Units in sodium chloride 0.9 % 500 mL irrigation  Status:  Discontinued       As needed 04/06/18 0834 04/06/18 0920   04/06/18 0735  ceFAZolin (ANCEF) 2-4 GM/100ML-% IVPB    Note to Pharmacy:  Lauro Franklin   : cabinet override      04/06/18 0735 04/06/18 1944      Discharge Exam: Blood pressure Marland Kitchen)  91/46, pulse (!) 54, temperature 98 F (36.7 C), temperature source Oral, resp. rate 18, last menstrual period 02/15/1985, SpO2 100 %. Neurologic: Grossly normal Dressing CDI  Discharge Medications:   Allergies as of 04/07/2018      Reactions   Fish Allergy Anaphylaxis   Iodine Anaphylaxis   " PER PT IV CONTRAST"   Erythromycin    UNSPECIFIED REACTION    Sulfonamide Derivatives Other (See Comments)   Tongue turns black.      Medication List    TAKE these medications   acetaminophen 325 MG tablet Commonly known as:  TYLENOL Take 650 mg by mouth daily as needed (One hour prior to infusion).   amoxicillin 500 MG capsule Commonly known as:  AMOXIL Take 2,000 mg by mouth See admin instructions. Take 2000 mg by mouth 1 hour prior to dental appointment    amphetamine-dextroamphetamine 5 MG tablet Commonly known as:  ADDERALL Take 10 mg by mouth daily as needed (stress).   chlorpheniramine 4 MG tablet Commonly known as:  CHLOR-TRIMETON Take 4 mg by mouth daily as needed for allergies.   diphenhydrAMINE 25 mg capsule Commonly known as:  BENADRYL Take 25 mg by mouth daily as needed (One hour prior to infusion).   famotidine 10 MG tablet Commonly known as:  PEPCID Take 10 mg by mouth 2 (two) times daily as needed for heartburn or indigestion.   Fish Oil 1000 MG Caps Take 1,000 mg by mouth daily.   ibuprofen 200 MG tablet Commonly known as:  ADVIL,MOTRIN Take 400 mg by mouth every 6 (six) hours as needed (for inflammation).   Magnesium 250 MG Tabs Take 500-1,000 tablets by mouth daily.   methocarbamol 500 MG tablet Commonly known as:  ROBAXIN Take 1 tablet (500 mg total) by mouth every 6 (six) hours as needed for muscle spasms.   mometasone-formoterol 100-5 MCG/ACT Aero Commonly known as:  DULERA Inhale 1 puff into the lungs daily.   NONFORMULARY OR COMPOUNDED ITEM Estradiol 0.02% vaginal cream.  Place 1/2 gram vaginally two to three times weekly at bedtime. Disp:  3 month supply What changed:    how much to take  how to take this  when to take this   ORENCIA IV Inject 500 mg into the vein every 30 (thirty) days. Infuse 2 vials / 500 mg over 30 minutes every 4 weeks   promethazine 25 MG tablet Commonly known as:  PHENERGAN Take 25 mg by mouth every 6 (six) hours as needed for nausea or vomiting.   RESTASIS 0.05 % ophthalmic emulsion Generic drug:  cycloSPORINE Place 1 drop into both eyes every morning.   SALONPAS EX Apply topically as needed.   Sodium Fluoride 1.1 % Pste Place 1 application onto teeth 2 (two) times daily.   tapentadol 50 MG tablet Commonly known as:  NUCYNTA Take 1 tablet (50 mg total) by mouth every 6 (six) hours as needed for moderate pain.   temazepam 30 MG capsule Commonly known as:   RESTORIL Take 30 mg by mouth at bedtime.   VENTOLIN HFA 108 (90 Base) MCG/ACT inhaler Generic drug:  albuterol Inhale 2 puffs into the lungs every 4 (four) hours as needed for wheezing or shortness of breath.   Vitamin D (Ergocalciferol) 1.25 MG (50000 UT) Caps capsule Commonly known as:  DRISDOL Take 1 capsule (50,000 Units total) by mouth every 7 (seven) days.       Disposition: home   Final Dx: extraforaminal L2-3 microdiskctomy  Discharge Instructions  Remove dressing in 72 hours   Complete by:  As directed    Call MD for:  difficulty breathing, headache or visual disturbances   Complete by:  As directed    Call MD for:  persistant nausea and vomiting   Complete by:  As directed    Call MD for:  redness, tenderness, or signs of infection (pain, swelling, redness, odor or green/yellow discharge around incision site)   Complete by:  As directed    Call MD for:  severe uncontrolled pain   Complete by:  As directed    Call MD for:  temperature >100.4   Complete by:  As directed    Diet - low sodium heart healthy   Complete by:  As directed    Increase activity slowly   Complete by:  As directed          Signed: Eustace Orbach 04/07/2018, 7:43 AM

## 2018-04-07 NOTE — Progress Notes (Signed)
Patient alert and oriented, mae's well, voiding adequate amount of urine, swallowing without difficulty, no c/o pain at time of discharge. Patient discharged home with family. Script and discharged instructions given to patient. Patient and family stated understanding of instructions given. Patient has an appointment with Dr. Jones °

## 2018-04-17 ENCOUNTER — Telehealth: Payer: Self-pay | Admitting: Neurology

## 2018-04-17 NOTE — Telephone Encounter (Signed)
pt has called for the intrafusion suite, call transferred °

## 2018-04-24 NOTE — Telephone Encounter (Signed)
Pt called for intra fusion , call transferred

## 2018-05-10 ENCOUNTER — Other Ambulatory Visit: Payer: Self-pay | Admitting: *Deleted

## 2018-05-10 DIAGNOSIS — M0519 Rheumatoid lung disease with rheumatoid arthritis of multiple sites: Secondary | ICD-10-CM | POA: Diagnosis not present

## 2018-05-10 DIAGNOSIS — M0579 Rheumatoid arthritis with rheumatoid factor of multiple sites without organ or systems involvement: Secondary | ICD-10-CM | POA: Diagnosis not present

## 2018-05-10 NOTE — Addendum Note (Signed)
Addended by: Inis Sizer D on: 05/10/2018 04:31 PM   Modules accepted: Orders

## 2018-05-11 ENCOUNTER — Telehealth: Payer: Self-pay | Admitting: *Deleted

## 2018-05-11 LAB — CBC WITH DIFFERENTIAL/PLATELET
Basophils Absolute: 0 10*3/uL (ref 0.0–0.2)
Basos: 1 %
EOS (ABSOLUTE): 0.3 10*3/uL (ref 0.0–0.4)
Eos: 4 %
Hematocrit: 36.6 % (ref 34.0–46.6)
Hemoglobin: 12.2 g/dL (ref 11.1–15.9)
Immature Grans (Abs): 0 10*3/uL (ref 0.0–0.1)
Immature Granulocytes: 0 %
Lymphocytes Absolute: 2 10*3/uL (ref 0.7–3.1)
Lymphs: 29 %
MCH: 28.2 pg (ref 26.6–33.0)
MCHC: 33.3 g/dL (ref 31.5–35.7)
MCV: 85 fL (ref 79–97)
Monocytes Absolute: 0.5 10*3/uL (ref 0.1–0.9)
Monocytes: 8 %
NEUTROS ABS: 4.1 10*3/uL (ref 1.4–7.0)
Neutrophils: 58 %
Platelets: 333 10*3/uL (ref 150–450)
RBC: 4.32 x10E6/uL (ref 3.77–5.28)
RDW: 13 % (ref 11.7–15.4)
WBC: 7 10*3/uL (ref 3.4–10.8)

## 2018-05-11 LAB — COMPREHENSIVE METABOLIC PANEL
ALT: 13 IU/L (ref 0–32)
AST: 15 IU/L (ref 0–40)
Albumin/Globulin Ratio: 2.7 — ABNORMAL HIGH (ref 1.2–2.2)
Albumin: 4.3 g/dL (ref 3.8–4.8)
Alkaline Phosphatase: 67 IU/L (ref 39–117)
BUN/Creatinine Ratio: 24 (ref 12–28)
BUN: 12 mg/dL (ref 8–27)
Bilirubin Total: 0.3 mg/dL (ref 0.0–1.2)
CO2: 21 mmol/L (ref 20–29)
Calcium: 9.3 mg/dL (ref 8.7–10.3)
Chloride: 102 mmol/L (ref 96–106)
Creatinine, Ser: 0.49 mg/dL — ABNORMAL LOW (ref 0.57–1.00)
GFR calc Af Amer: 117 mL/min/{1.73_m2} (ref >59)
GFR calc non Af Amer: 102 mL/min/{1.73_m2} (ref >59)
Globulin, Total: 1.6 g/dL (ref 1.5–4.5)
Glucose: 81 mg/dL (ref 65–99)
Potassium: 4 mmol/L (ref 3.5–5.2)
Sodium: 140 mmol/L (ref 134–144)
Total Protein: 5.9 g/dL — ABNORMAL LOW (ref 6.0–8.5)

## 2018-05-11 NOTE — Telephone Encounter (Signed)
I called and spoke with pt. Relayed labs results per Dr. Felecia Shelling note. She would like results faxed to her at [phone] Attn: Santiago Glad. She is unable to sign up for mychart, does not have internet access.

## 2018-05-11 NOTE — Telephone Encounter (Signed)
-----   Message from Britt Bottom, MD sent at 05/11/2018  9:38 AM EDT ----- Please let the patient know that the lab work is fine.

## 2018-05-11 NOTE — Telephone Encounter (Signed)
Lab results faxed to Sherry at [phone].  Confirmation received.

## 2018-06-07 ENCOUNTER — Other Ambulatory Visit: Payer: Self-pay | Admitting: *Deleted

## 2018-06-07 DIAGNOSIS — M0579 Rheumatoid arthritis with rheumatoid factor of multiple sites without organ or systems involvement: Secondary | ICD-10-CM

## 2018-06-07 DIAGNOSIS — M0519 Rheumatoid lung disease with rheumatoid arthritis of multiple sites: Secondary | ICD-10-CM | POA: Diagnosis not present

## 2018-07-05 DIAGNOSIS — M0579 Rheumatoid arthritis with rheumatoid factor of multiple sites without organ or systems involvement: Secondary | ICD-10-CM | POA: Diagnosis not present

## 2018-07-05 DIAGNOSIS — M0519 Rheumatoid lung disease with rheumatoid arthritis of multiple sites: Secondary | ICD-10-CM | POA: Diagnosis not present

## 2018-07-05 NOTE — Addendum Note (Signed)
Addended by: Inis Sizer D on: 07/05/2018 01:09 PM   Modules accepted: Orders

## 2018-07-06 ENCOUNTER — Telehealth: Payer: Self-pay | Admitting: *Deleted

## 2018-07-06 LAB — CBC WITH DIFFERENTIAL/PLATELET
Basophils Absolute: 0 10*3/uL (ref 0.0–0.2)
Basos: 0 %
EOS (ABSOLUTE): 0.4 10*3/uL (ref 0.0–0.4)
Eos: 5 %
Hematocrit: 35 % (ref 34.0–46.6)
Hemoglobin: 12.2 g/dL (ref 11.1–15.9)
Immature Grans (Abs): 0 10*3/uL (ref 0.0–0.1)
Immature Granulocytes: 0 %
Lymphocytes Absolute: 2 10*3/uL (ref 0.7–3.1)
Lymphs: 28 %
MCH: 29.3 pg (ref 26.6–33.0)
MCHC: 34.9 g/dL (ref 31.5–35.7)
MCV: 84 fL (ref 79–97)
Monocytes Absolute: 0.5 10*3/uL (ref 0.1–0.9)
Monocytes: 7 %
Neutrophils Absolute: 4.3 10*3/uL (ref 1.4–7.0)
Neutrophils: 60 %
Platelets: 397 10*3/uL (ref 150–450)
RBC: 4.16 x10E6/uL (ref 3.77–5.28)
RDW: 12.7 % (ref 11.7–15.4)
WBC: 7.4 10*3/uL (ref 3.4–10.8)

## 2018-07-06 LAB — COMPREHENSIVE METABOLIC PANEL
ALT: 15 IU/L (ref 0–32)
AST: 18 IU/L (ref 0–40)
Albumin/Globulin Ratio: 2.8 — ABNORMAL HIGH (ref 1.2–2.2)
Albumin: 4.4 g/dL (ref 3.8–4.8)
Alkaline Phosphatase: 72 IU/L (ref 39–117)
BUN/Creatinine Ratio: 22 (ref 12–28)
BUN: 10 mg/dL (ref 8–27)
Bilirubin Total: <0.2 mg/dL (ref 0.0–1.2)
CO2: 23 mmol/L (ref 20–29)
Calcium: 9 mg/dL (ref 8.7–10.3)
Chloride: 104 mmol/L (ref 96–106)
Creatinine, Ser: 0.45 mg/dL — ABNORMAL LOW (ref 0.57–1.00)
GFR calc Af Amer: 121 mL/min/{1.73_m2} (ref >59)
GFR calc non Af Amer: 105 mL/min/{1.73_m2} (ref >59)
Globulin, Total: 1.6 g/dL (ref 1.5–4.5)
Glucose: 101 mg/dL — ABNORMAL HIGH (ref 65–99)
Potassium: 4.4 mmol/L (ref 3.5–5.2)
Sodium: 140 mmol/L (ref 134–144)
Total Protein: 6 g/dL (ref 6.0–8.5)

## 2018-07-06 NOTE — Telephone Encounter (Signed)
-----   Message from Britt Bottom, MD sent at 07/06/2018  8:25 AM EDT ----- Regarding: labs Please let the patient know that the lab work is fine.

## 2018-07-06 NOTE — Telephone Encounter (Signed)
Called, spoke with pt and advised labs are fine per Dr. Felecia Shelling. She verbalized understanding.

## 2018-07-20 DIAGNOSIS — M545 Low back pain: Secondary | ICD-10-CM | POA: Diagnosis not present

## 2018-08-02 DIAGNOSIS — M0519 Rheumatoid lung disease with rheumatoid arthritis of multiple sites: Secondary | ICD-10-CM | POA: Diagnosis not present

## 2018-08-08 NOTE — Progress Notes (Signed)
Office Visit Note  [name]             Date of Birth: 1950-12-04           MRN: [id number]             PCP: Kelton Pillar, MD Referring: Kelton Pillar, MD Visit Date: 08/22/2018 Occupation: @GUAROCC @  Subjective:  Joint stiffness    History of Present Illness: Kristy Perez is a 68 y.o. female with history of seropositive rheumatoid arthritis, osteoarthritis, and osteoporosis.  She is currently on Orencia IV infusions every 28 days at [facility].  She is due for her next infusion a week from tomorrow.  She reports that about 2 weeks prior to her with patient starts having increased joint stiffness.  She is having some discomfort in bilateral hands.  She denies any joint swelling.  She has not had any recent rheumatoid arthritis flares.  She would like to continue on Orencia as prescribed.  She continues to have chronic lower back pain.  She had surgery performed by Dr. Ronnald Ramp in February and her right-sided radiculopathy has resolved and she is able to a on her back at night.  She still continues to have some discomfort if she is sitting.  She often paces around the room to get some discomfort.  She is planning on following up with Dr. Ernestina Patches for an epidural injection in the near future.  Patient reports that she has been having dyspnea on exertion since her surgery at the end of February.  She has used a pulse ox at home and her oxygen saturation was 98%.  She states that she continues to walk 5 miles every morning.  She states that she did see Dr. Annamaria Boots in the past but has not seen him recently.  She does have a history of asthma in childhood.  Activities of Daily Living:  Patient reports morning stiffness for several hours.   Patient Reports nocturnal pain.  Difficulty dressing/grooming: Reports Difficulty climbing stairs: Denies Difficulty getting out of chair: Reports Difficulty using hands for taps, buttons, cutlery, and/or writing: Reports  Review of Systems   Constitutional: Positive for fatigue.  HENT: Positive for mouth dryness. Negative for mouth sores and nose dryness.   Eyes: Positive for dryness. Negative for pain, itching and visual disturbance.  Respiratory: Positive for shortness of breath. Negative for cough, hemoptysis, wheezing and difficulty breathing.   Cardiovascular: Positive for palpitations (Hx of PACs). Negative for chest pain, hypertension and swelling in legs/feet.  Gastrointestinal: Negative for abdominal pain, blood in stool, constipation and diarrhea.  Endocrine: Negative for increased urination.  Genitourinary: Negative for painful urination and pelvic pain.  Musculoskeletal: Positive for arthralgias, joint pain and morning stiffness. Negative for joint swelling, myalgias, muscle weakness, muscle tenderness and myalgias.  Skin: Negative for color change, pallor, rash, hair loss, nodules/bumps, redness, skin tightness, ulcers and sensitivity to sunlight.  Allergic/Immunologic: Negative for susceptible to infections.  Neurological: Negative for dizziness, light-headedness, numbness, headaches, memory loss and weakness.  Hematological: Negative for bruising/bleeding tendency and swollen glands.  Psychiatric/Behavioral: Negative for depressed mood, confusion and sleep disturbance. The patient is not nervous/anxious.     PMFS History:  Patient Active Problem List   Diagnosis Date Noted  . S/P lumbar laminectomy 04/06/2018  . Radiculopathy due to lumbar intervertebral disc disorder 12/07/2017  . Congenital spondylolysis 12/07/2017  . Spondylolisthesis of lumbar region 12/07/2017  . DDD (degenerative disc disease), lumbar 12/08/2016  . High risk medication use 03/15/2016  .  DJD (degenerative joint disease), cervical 03/15/2016  . History of asthma 03/15/2016  . History of squamous cell carcinoma 03/15/2016  . History of basal cell carcinoma 03/15/2016  . History of breast cancer 03/15/2016  . History of Chiari malformation  03/15/2016  . Vitamin D deficiency 03/15/2016  . Rheumatoid arthritis with rheumatoid factor of multiple sites without organ or systems involvement ([place]) 10/20/2015  . Esophageal reflux 05/01/2015  . Anxiety 05/01/2015  . Sjogrens syndrome ([place]) 05/01/2015  . Chronic obstructive airway disease with asthma ([employer]) 05/01/2015  . Deviated nasal septum 05/24/2014    Class: Chronic  . Insomnia 11/25/2007  . DYSPNEA 11/15/2007    Past Medical History:  Diagnosis Date  . Anemia   . Asthma    inhaler one x per day  . Cancer [facility])    breast, left  . Chiari malformation   . Difficult intubation    cannot hyperextend neck due to Chiari malformation with tonsillar involvement, RA  . Dysrhythmia 2015   history of PAC's  . GERD (gastroesophageal reflux disease)   . PONV (postoperative nausea and vomiting)   . Rheumatoid arthritis (HCC)    on Humira    Family History  Problem Relation Age of Onset  . Diabetes Mother   . Heart failure Mother   . Bladder Cancer Mother   . Stroke Mother   . Cancer Father        head and neck  . Uterine cancer Maternal Aunt   . Bladder Cancer Maternal Grandmother   . Colon cancer Maternal Grandmother    Past Surgical History:  Procedure Laterality Date  . BREAST SURGERY Left 2003   Lumpectomy T1C1, NO and negative ER/ PR  . COLONOSCOPY  10/04  . COLONOSCOPY  11/10   normal recheck in 5 years  . COLPORRHAPHY    . ESOPHAGOGASTRODUODENOSCOPY ENDOSCOPY  11/10   GERD  . FOOT SURGERY Left age 25   removal of pseudo rheumatoid nodules from left forefoot.  Marland Kitchen LAPAROSCOPIC BILATERAL SALPINGO OOPHERECTOMY N/A 07/30/2013   Procedure: LAPAROSCOPIC BILATERAL SALPINGO OOPHORECTOMY WITH WASHINGS;  Surgeon: Lyman Speller, MD;  Location: [facility];  Service: Gynecology;  Laterality: N/A;  . LUMBAR LAMINECTOMY/DECOMPRESSION MICRODISCECTOMY Right 04/06/2018   Procedure: Extraforaminal Microdiscectomy - Lumbar Two-Lumbar Three - right;  Surgeon: Eustace Petterson, MD;   Location: [place];  Service: Neurosurgery;  Laterality: Right;  Extraforaminal Microdiscectomy - Lumbar Two-Lumbar Three - right  . NASAL SEPTOPLASTY W/ TURBINOPLASTY Bilateral 05/24/2014   Procedure: NASAL SEPTOPLASTY WITH  BILATERAL TURBINATE REDUCTION;  Surgeon: Jerrell Belfast, MD;  Location: [place];  Service: ENT;  Laterality: Bilateral;  . PELVIC FLOOR REPAIR  3/04   SPARC with AP colporrhaphy  . PILONIDAL CYST EXCISION    . TOTAL VAGINAL HYSTERECTOMY  1989   with AP repair   Social History   Social History Narrative   Lives alone in a 2 story home. Has 2 children.  Works as a Radiation protection practitioner.  Education: college.    Immunization History  Administered Date(s) Administered  . Influenza, High Dose Seasonal PF 10/26/2017  . Influenza,inj,Quad PF,6+ Mos 10/18/2016  . Influenza-Unspecified 10/08/2014, 10/24/2015  . Pneumococcal-Unspecified 10/18/2007  . Tdap 11/24/2008  . Zoster Recombinat (Shingrix) 12/25/2016     Objective: Vital Signs: BP (!) 107/57 (BP Location: Left Arm, Patient Position: Sitting, Cuff Size: Normal)   Pulse 66   Resp 13   Ht 5\' 2"  (1.575 m)   Wt 128 lb 9.6 oz (58.3 kg)   LMP  02/15/1985 (Approximate)   BMI 23.52 kg/m    Physical Exam   Musculoskeletal Exam: C-spine limited ROM.  Limited ROM of lumbar spine.  Shoulder joints and elbow joints good ROM.  Limited ROM of both wrist joints.  Ulnar deviation and synovial thickening of MCP joints.  Hip joints, knee joints, ankle joints, MTPs, PIPs, and DIPs good ROM with no synovitis.  No warmth or effusion of knee joints.  No tenderness or swelling of ankle joint.   CDAI Exam: CDAI Score: 1  Patient Global: 5 mm; Provider Global: 5 mm Swollen: 0 ; Tender: 0  Joint Exam   No joint exam has been documented for this visit   There is currently no information documented on the homunculus. Go to the Rheumatology activity and complete the homunculus joint exam.  Investigation: No additional findings.  Imaging: No  results found.  Recent Labs: Lab Results  Component Value Date   WBC 7.4 07/05/2018   HGB 12.2 07/05/2018   PLT 397 07/05/2018   NA 140 07/05/2018   K 4.4 07/05/2018   CL 104 07/05/2018   CO2 23 07/05/2018   GLUCOSE 101 (H) 07/05/2018   BUN 10 07/05/2018   CREATININE 0.45 (L) 07/05/2018   BILITOT <0.2 07/05/2018   ALKPHOS 72 07/05/2018   AST 18 07/05/2018   ALT 15 07/05/2018   PROT 6.0 07/05/2018   ALBUMIN 4.4 07/05/2018   CALCIUM 9.0 07/05/2018   GFRAA 121 07/05/2018   QFTBGOLD Negative 06/15/2016   QFTBGOLDPLUS NEGATIVE 10/19/2017    Speciality Comments: Orencia IV 500mg  every 4 weeks, patient infuses at [facility] Tb gold negative 06/15/16  Procedures:  No procedures performed Allergies: Fish allergy, Iodine, Erythromycin, and Sulfonamide derivatives   Assessment / Plan:     Visit Diagnoses: Rheumatoid arthritis with rheumatoid factor of multiple sites without organ or systems involvement ([place]) - +RF, +CCP: She has no synovitis on exam.  She is had any recent rheumatoid arthritis flares.  She has no tenderness or inflammation on exam today.  She does experience joint stiffness about 2 weeks prior to her Orencia infusions.  She has been getting Orencia IV infusions every 28 days at [place].  She has not missed any doses recently.  She has chronic pain in bilateral hands and bilateral feet.  She will continue on Orencia as prescribed.  She is advised to notify us if she has increased joint pain or joint swelling.  She will follow-up in the office in 5 months.  High risk medication use -Orencia IV infusion 500 mg every 28 days at [facility].  Last TB gold negative on 10/19/2017 and will monitor yearly.  Most recent CBC/CMP within normal limits except for decreased creatinine on 07/05/2018.  Will monitor every 3 months.  She received a high-dose flu vaccine in September, unspecified pneumonia vaccine in 2009, and one Shingrix vaccine.  (inadequate response to Enbrel and Humira)   DDD (degenerative  disc disease), cervical -She has slightly limited ROM with no discomfort.  She has no symptoms of radiculopathy at this time.    DDD (degenerative disc disease), lumbar - Chronic pain.  S/p lumbar laminectomy on 04/06/18 by Dr. Ronnald Ramp. The right sided radiculopathy has resolved since surgery.  She continues to have pain if sitting for prolonged periods of time.  She paced the room during the exam.  She has no difficulty laying flat at night and has been able to walk 5 miles every morning.  She is planning on following up with Dr. Ernestina Patches  to discuss a repeat epidural injection in the future.    Age-related osteoporosis without current pathological fracture -She is not currently on therapy.  She takes a vitamin D supplement daily.  Last DEXA ordered by PCP in November 2018 showed T score of -2.6 and BMD of 0.558 at left hip and -3% change compared to 2016.  Due for repeat DEXA November 2020.  She would like to start on Reclast IV infusions.  We discussed indications, contraindications, and potential side effects of Reclast.    Vitamin D deficiency - She is taking a vitamin D supplement.  Primary insomnia - She takes Restoril 30 mg po at bedtime for insomnia.   Trochanteric bursitis of right hip - Resolved    Dyspnea on exertion: She has been experiencing dyspnea on exertion since the end of February following the lumbar laminectomy.  She is been walking 5 miles daily.  She has no other pulmonary symptoms at this time.  She does have a history of childhood asthma.  She has been evaluated by Dr. Annamaria Boots in the past but not recently.  We will place a referral to pulmonology today for further evaluation.   Other medical conditions are listed as follows:  History of asthma-Childhood  History of Chiari malformation   Gastroesophageal reflux disease without esophagitis  History of breast cancer   History of basal cell carcinoma   History of squamous cell carcinoma   Orders: No orders of the defined  types were placed in this encounter.  No orders of the defined types were placed in this encounter.   Face-to-face time spent with patient was 30 minutes. Greater than 50% of time was spent in counseling and coordination of care.  Follow-Up Instructions: Return in about 5 months (around 01/22/2019) for Rheumatoid arthritis, Osteoporosis.   Ofilia Neas, PA-C  Note - This record has been created using Dragon software.  Chart creation errors have been sought, but may not always  have been located. Such creation errors do not reflect on  the standard of medical care.

## 2018-08-22 ENCOUNTER — Ambulatory Visit (INDEPENDENT_AMBULATORY_CARE_PROVIDER_SITE_OTHER): Payer: Medicare Other | Admitting: Physician Assistant

## 2018-08-22 ENCOUNTER — Encounter: Payer: Self-pay | Admitting: Physician Assistant

## 2018-08-22 ENCOUNTER — Telehealth: Payer: Self-pay | Admitting: Pharmacist

## 2018-08-22 ENCOUNTER — Other Ambulatory Visit: Payer: Self-pay

## 2018-08-22 VITALS — BP 107/57 | HR 66 | Resp 13 | Ht 62.0 in | Wt 128.6 lb

## 2018-08-22 DIAGNOSIS — M5136 Other intervertebral disc degeneration, lumbar region: Secondary | ICD-10-CM

## 2018-08-22 DIAGNOSIS — F5101 Primary insomnia: Secondary | ICD-10-CM | POA: Diagnosis not present

## 2018-08-22 DIAGNOSIS — Z853 Personal history of malignant neoplasm of breast: Secondary | ICD-10-CM

## 2018-08-22 DIAGNOSIS — Z85828 Personal history of other malignant neoplasm of skin: Secondary | ICD-10-CM

## 2018-08-22 DIAGNOSIS — Z79899 Other long term (current) drug therapy: Secondary | ICD-10-CM | POA: Diagnosis not present

## 2018-08-22 DIAGNOSIS — M7061 Trochanteric bursitis, right hip: Secondary | ICD-10-CM

## 2018-08-22 DIAGNOSIS — Z8589 Personal history of malignant neoplasm of other organs and systems: Secondary | ICD-10-CM

## 2018-08-22 DIAGNOSIS — Z8669 Personal history of other diseases of the nervous system and sense organs: Secondary | ICD-10-CM | POA: Diagnosis not present

## 2018-08-22 DIAGNOSIS — E559 Vitamin D deficiency, unspecified: Secondary | ICD-10-CM | POA: Diagnosis not present

## 2018-08-22 DIAGNOSIS — R0600 Dyspnea, unspecified: Secondary | ICD-10-CM

## 2018-08-22 DIAGNOSIS — M5030 Other cervical disc degeneration, unspecified cervical region: Secondary | ICD-10-CM

## 2018-08-22 DIAGNOSIS — Z8709 Personal history of other diseases of the respiratory system: Secondary | ICD-10-CM

## 2018-08-22 DIAGNOSIS — M0579 Rheumatoid arthritis with rheumatoid factor of multiple sites without organ or systems involvement: Secondary | ICD-10-CM

## 2018-08-22 DIAGNOSIS — M810 Age-related osteoporosis without current pathological fracture: Secondary | ICD-10-CM

## 2018-08-22 DIAGNOSIS — R0609 Other forms of dyspnea: Secondary | ICD-10-CM

## 2018-08-22 DIAGNOSIS — K219 Gastro-esophageal reflux disease without esophagitis: Secondary | ICD-10-CM

## 2018-08-22 NOTE — Progress Notes (Signed)
Pharmacy Note  Subjective: Patient presents today to the [facility] to see Dr. Estanislado Pandy.  Patient seen by pharmacist for counseling on bisphosphonate therapy for osteoporosis.  She has taken fosamax and Boniva in the past and unable to tolerate due to GI effects.  Objective:  T-score: -2.6 in 2018  Lab Results  Component Value Date   VD25OH 26.1 (L) 07/07/2017   CMP     Component Value Date/Time   NA 140 07/05/2018 1311   K 4.4 07/05/2018 1311   CL 104 07/05/2018 1311   CO2 23 07/05/2018 1311   GLUCOSE 101 (H) 07/05/2018 1311   GLUCOSE 91 03/30/2018 1051   BUN 10 07/05/2018 1311   CREATININE 0.45 (L) 07/05/2018 1311   CALCIUM 9.0 07/05/2018 1311   PROT 6.0 07/05/2018 1311   ALBUMIN 4.4 07/05/2018 1311   AST 18 07/05/2018 1311   ALT 15 07/05/2018 1311   ALKPHOS 72 07/05/2018 1311   BILITOT <0.2 07/05/2018 1311   GFRNONAA 105 07/05/2018 1311   GFRAA 121 07/05/2018 1311   Assessment & Plan:  Counseled patient that  Reclast is an IV bisphosphonate that reduces bone turnover by inhibiting osteoclasts that chew up bone.  Counseled patient on purpose, proper use, and adverse effects of Reclast.   Reviewed importance of taking calcium and vitamin D with bisphosphonate therapy.  She is currently taking Vitamin D 50,000 units weekly and eats 3 servings of dairy a day.  Instructed patient to eat 3 servings of dairy to obtain recommended daily amount of 1200 mg. Patient verbalized understanding.  Provided patient with medication education material and answered all questions.   Reviewed adverse events of Reclast including risk of nausea & diarrhea, headache, and muscle & bone pain.  Reviewed rare adverse effect of osteonecrosis of the jaw and advised patient to alert her dentist that she is on Reclast prior to any major dental work.   She states she has had major dental work in the past but has not seen a dentist in over a year. Recommend making appointment with dentist to see  if she needs any major dental work prior to starting Reclast.  Patient verbalized understanding.   Patient agrees to trial of Reclast at this time.  Instructed patient that she must have CBC/CMP/ and Vitamin D within 30 days prior to infusion.  She gets CBC/CMP regularly with her infusion  Placed future order for Vitamin D.    She has Medicare and supplemental insurance which should cover majority of the cost of the infusion.  Will check to see if requires pre-certification and update patient when we receive a response.  Reclast orders will be placed after patient has seen dentist, approved through insurance, and obtained appropriate labs.  All questions encouraged and answered.  Instructed patient to call with any further questions or concerns.  Mariella Saa, PharmD, [place], CPP Rheumatology Clinical Pharmacist  08/22/2018 11:14 AM

## 2018-08-22 NOTE — Telephone Encounter (Signed)
Please start BIV for Reclast. She has tried fosamax and Boniva in the past but discontinued due to GI effects.  T-score -2.6 in 2018.  She has Medicare and supplemental insurance which should cover majority of the cost of the infusion.  Will check to see if requires pre-certification and update patient when we receive a response.  Reclast orders will be placed after patient has seen dentist, approved through insurance, and obtained appropriate labs.

## 2018-08-22 NOTE — Telephone Encounter (Signed)
Findings of benefits investigation for Reclast :   Insurance: Medicare and [place]  Phone: [phone] / [phone]  Both plans are active.  Medicare covers 80% of the infusion and no authorization is required as long as billed with an appropriate diagnosis code, and the patient's [employer] supplement would cover the 20% of the cost that was not paid for by Medicare as long as Medicare covered the medication. The Supplement also covers the patient's Medicare deductible.  11:41 AM Beatriz Chancellor, CPhT

## 2018-08-24 NOTE — Telephone Encounter (Signed)
Left voicemail and asked for a return call to discuss.

## 2018-08-29 ENCOUNTER — Telehealth: Payer: Self-pay | Admitting: Physical Medicine and Rehabilitation

## 2018-08-29 NOTE — Telephone Encounter (Signed)
Patient returned my call.  She states she no longer wants to proceed with Reclast.  She is concerned about the risk of ONJ since she has frequent invasive dental work that is needed.  Will notify Dr. Estanislado Pandy and follow up with patient if there are any other recommendations.  All questions encouraged and answered.  Instructed patient to call with any further questions or concerns.  Mariella Saa, PharmD, [facility] Clinical Pharmacist  08/29/2018 9:19 AM

## 2018-08-29 NOTE — Telephone Encounter (Signed)
ok 

## 2018-08-30 ENCOUNTER — Other Ambulatory Visit: Payer: Self-pay | Admitting: Neurology

## 2018-08-30 ENCOUNTER — Other Ambulatory Visit: Payer: Self-pay

## 2018-08-30 DIAGNOSIS — M0579 Rheumatoid arthritis with rheumatoid factor of multiple sites without organ or systems involvement: Secondary | ICD-10-CM | POA: Diagnosis not present

## 2018-08-30 DIAGNOSIS — M0519 Rheumatoid lung disease with rheumatoid arthritis of multiple sites: Secondary | ICD-10-CM | POA: Diagnosis not present

## 2018-08-30 NOTE — Telephone Encounter (Signed)
Scheduled for right L5-S1 facet per Dr. Ernestina Patches.

## 2018-08-30 NOTE — Telephone Encounter (Signed)
Left message #1

## 2018-08-31 ENCOUNTER — Telehealth: Payer: Self-pay | Admitting: *Deleted

## 2018-08-31 ENCOUNTER — Other Ambulatory Visit: Payer: Self-pay | Admitting: Neurology

## 2018-08-31 DIAGNOSIS — M0579 Rheumatoid arthritis with rheumatoid factor of multiple sites without organ or systems involvement: Secondary | ICD-10-CM

## 2018-08-31 LAB — COMPREHENSIVE METABOLIC PANEL
ALT: 13 IU/L (ref 0–32)
AST: 15 IU/L (ref 0–40)
Albumin/Globulin Ratio: 2.1 (ref 1.2–2.2)
Albumin: 4.5 g/dL (ref 3.8–4.8)
Alkaline Phosphatase: 69 IU/L (ref 39–117)
BUN/Creatinine Ratio: 24 (ref 12–28)
BUN: 13 mg/dL (ref 8–27)
Bilirubin Total: 0.3 mg/dL (ref 0.0–1.2)
CO2: 21 mmol/L (ref 20–29)
Calcium: 9 mg/dL (ref 8.7–10.3)
Chloride: 101 mmol/L (ref 96–106)
Creatinine, Ser: 0.55 mg/dL — ABNORMAL LOW (ref 0.57–1.00)
GFR calc Af Amer: 113 mL/min/{1.73_m2} (ref >59)
GFR calc non Af Amer: 98 mL/min/{1.73_m2} (ref >59)
Globulin, Total: 2.1 g/dL (ref 1.5–4.5)
Glucose: 72 mg/dL (ref 65–99)
Potassium: 4.2 mmol/L (ref 3.5–5.2)
Sodium: 140 mmol/L (ref 134–144)
Total Protein: 6.6 g/dL (ref 6.0–8.5)

## 2018-08-31 LAB — CBC WITH DIFFERENTIAL/PLATELET
Basophils Absolute: 0 10*3/uL (ref 0.0–0.2)
Basos: 1 %
EOS (ABSOLUTE): 0.3 10*3/uL (ref 0.0–0.4)
Eos: 4 %
Hematocrit: 39.3 % (ref 34.0–46.6)
Hemoglobin: 12.7 g/dL (ref 11.1–15.9)
Immature Grans (Abs): 0 10*3/uL (ref 0.0–0.1)
Immature Granulocytes: 0 %
Lymphocytes Absolute: 2 10*3/uL (ref 0.7–3.1)
Lymphs: 28 %
MCH: 28.3 pg (ref 26.6–33.0)
MCHC: 32.3 g/dL (ref 31.5–35.7)
MCV: 88 fL (ref 79–97)
Monocytes Absolute: 0.5 10*3/uL (ref 0.1–0.9)
Monocytes: 7 %
Neutrophils Absolute: 4.4 10*3/uL (ref 1.4–7.0)
Neutrophils: 60 %
Platelets: 393 10*3/uL (ref 150–450)
RBC: 4.49 x10E6/uL (ref 3.77–5.28)
RDW: 12.9 % (ref 11.7–15.4)
WBC: 7.3 10*3/uL (ref 3.4–10.8)

## 2018-08-31 LAB — SPECIMEN STATUS REPORT

## 2018-08-31 NOTE — Telephone Encounter (Signed)
-----   Message from Britt Bottom, MD sent at 08/31/2018  3:21 PM EDT ----- Please let the patient know that the lab work is fine.

## 2018-08-31 NOTE — Telephone Encounter (Signed)
Called and spoke with pt about lab results per Dr. Felecia Shelling note. Pt verbalized understanding.

## 2018-09-14 ENCOUNTER — Ambulatory Visit: Payer: Self-pay

## 2018-09-14 ENCOUNTER — Ambulatory Visit (INDEPENDENT_AMBULATORY_CARE_PROVIDER_SITE_OTHER): Payer: Medicare Other | Admitting: Physical Medicine and Rehabilitation

## 2018-09-14 ENCOUNTER — Encounter: Payer: Self-pay | Admitting: Physical Medicine and Rehabilitation

## 2018-09-14 ENCOUNTER — Other Ambulatory Visit: Payer: Self-pay

## 2018-09-14 VITALS — BP 111/54 | HR 70

## 2018-09-14 DIAGNOSIS — M961 Postlaminectomy syndrome, not elsewhere classified: Secondary | ICD-10-CM

## 2018-09-14 DIAGNOSIS — M47816 Spondylosis without myelopathy or radiculopathy, lumbar region: Secondary | ICD-10-CM | POA: Diagnosis not present

## 2018-09-14 MED ORDER — METHYLPREDNISOLONE ACETATE 80 MG/ML IJ SUSP
80.0000 mg | Freq: Once | INTRAMUSCULAR | Status: AC
  Administered 2018-09-14: 10:00:00 80 mg

## 2018-09-14 NOTE — Progress Notes (Signed)
 .  Numeric Pain Rating Scale and Functional Assessment Average Pain 5   In the last MONTH (on 0-10 scale) has pain interfered with the following?  1. General activity like being  able to carry out your everyday physical activities such as walking, climbing stairs, carrying groceries, or moving a chair?  Rating(6)   +Driver, -BT, -Dye Allergies.  

## 2018-09-27 DIAGNOSIS — M0519 Rheumatoid lung disease with rheumatoid arthritis of multiple sites: Secondary | ICD-10-CM | POA: Diagnosis not present

## 2018-10-10 ENCOUNTER — Encounter: Payer: Self-pay | Admitting: Internal Medicine

## 2018-10-10 ENCOUNTER — Other Ambulatory Visit: Payer: Self-pay

## 2018-10-10 ENCOUNTER — Telehealth: Payer: Self-pay | Admitting: Internal Medicine

## 2018-10-10 ENCOUNTER — Ambulatory Visit (INDEPENDENT_AMBULATORY_CARE_PROVIDER_SITE_OTHER): Payer: Medicare Other

## 2018-10-10 ENCOUNTER — Ambulatory Visit (INDEPENDENT_AMBULATORY_CARE_PROVIDER_SITE_OTHER): Payer: Medicare Other | Admitting: Internal Medicine

## 2018-10-10 DIAGNOSIS — R0609 Other forms of dyspnea: Secondary | ICD-10-CM | POA: Diagnosis not present

## 2018-10-10 DIAGNOSIS — R490 Dysphonia: Secondary | ICD-10-CM

## 2018-10-10 DIAGNOSIS — R0600 Dyspnea, unspecified: Secondary | ICD-10-CM

## 2018-10-10 DIAGNOSIS — J45909 Unspecified asthma, uncomplicated: Secondary | ICD-10-CM | POA: Diagnosis not present

## 2018-10-10 MED ORDER — BUDESONIDE-FORMOTEROL FUMARATE 80-4.5 MCG/ACT IN AERO: INHALATION_SPRAY | RESPIRATORY_TRACT | 11 refills | Status: DC

## 2018-10-10 MED ORDER — FAMOTIDINE 20 MG PO TABS: ORAL_TABLET | ORAL | 11 refills | Status: AC

## 2018-10-10 MED ORDER — BUDESONIDE-FORMOTEROL FUMARATE 80-4.5 MCG/ACT IN AERO: 2.0000 | INHALATION_SPRAY | Freq: Two times a day (BID) | RESPIRATORY_TRACT | 0 refills | Status: DC

## 2018-10-10 NOTE — Telephone Encounter (Signed)
Medication name and strength: Symbicort 80 Provider: MW Pharmacy: [facility] [place] Patient insurance ID: [id number]   Was the PA started on CMM?  Yes If yes, please enter the Key: [id number]    PA has been approved until 10/10/2019.   Spoke with pharmacist, they are aware of the approval. Nothing further needed.

## 2018-10-10 NOTE — Progress Notes (Signed)
Kristy Perez, female    DOB: February 09, 1951,     MRN: [id number]   Brief patient profile:  89 yowf with onset feet aching mid 1990s eventually dx as RA and some AB on saba while smoking 2001 and  And AB apparently resolved completely off cigs for a few years but then started needing more saba so rx   dulera 100 just one puff daily   Which eliminated need for saba   and did fine until around winter 2019 sob pc every daily either lunch=supper not related to quantity or quality of food and  not better p saba so referred to pulmonary clinic 10/10/2018 by Dr   Estanislado Pandy.  Was on ppi until around 2015 and pepcid and daily has sensation of hb worse since stopped ppi as "worried about the cost and reported side effects.   History of Present Illness  10/10/2018  Pulmonary/ 1st office eval/Jacorey Donaway  Chief Complaint  Patient presents with  . Pulmonary Consult    Referred by Dr. Estanislado Pandy for eval of SOB. [name].   Dyspnea:  MMRC1 = can walk nl pace, flat grade, can't hurry or go uphills or steps s sob   Cough: no Sleep: fine p temazepam/ one pillow on side bed flat no resp c/os SABA use: none now  No obvious day to day or daytime variability or assoc excess/ purulent sputum or mucus plugs or hemoptysis or cp or chest tightness, subjective wheeze or overt sinus or hb symptoms.   Sleeping as above without nocturnal  or early am exacerbation  of respiratory  c/o's or need for noct saba. Also denies any obvious fluctuation of symptoms with weather or environmental changes or other aggravating or alleviating factors except as outlined above   No unusual exposure hx or h/o childhood pna/ asthma or knowledge of premature birth.  Current Allergies, Complete Past Medical History, Past Surgical History, Family History, and Social History were reviewed in [employer] record.  ROS  The following are not active complaints unless bolded  Hoarseness prev eval shoemaker, sore throat, dysphagia, dental problems, itching, sneezing,  nasal congestion or discharge of excess mucus or purulent secretions, ear ache,   fever, chills, sweats, unintended wt loss or wt gain, classically pleuritic or exertional cp,  orthopnea pnd or arm/hand swelling  or leg swelling, presyncope, palpitations, abdominal pain, anorexia, nausea, vomiting, diarrhea  or change in bowel habits or change in bladder habits, change in stools or change in urine, dysuria, hematuria,  rash, arthralgias improved since on orencia, visual complaints, headache, numbness, weakness or ataxia or problems with walking or coordination,  change in mood or  memory.           Past Medical History:  Diagnosis Date  . Anemia   . Asthma    inhaler one x per day  . Cancer [facility])    breast, left  . Chiari malformation   . Difficult intubation    cannot hyperextend neck due to Chiari malformation with tonsillar involvement, RA  . Dysrhythmia 2015   history of PAC's  . GERD (gastroesophageal reflux disease)   . PONV (postoperative nausea and vomiting)   . Rheumatoid arthritis ([place])    on Humira    Outpatient Medications Prior to Visit  Medication Sig Dispense Refill  . Abatacept (ORENCIA IV) Inject 500 mg into the vein every 30 (thirty) days. Infuse 2 vials / 500 mg  over 30 minutes every 4 weeks    . acetaminophen (TYLENOL) 325 MG tablet Take 650 mg by mouth daily as needed (One hour prior to infusion).     Marland Kitchen albuterol (PROAIR HFA) 108 (90 Base) MCG/ACT inhaler Inhale 2 puffs into the lungs every 6 (six) hours as needed for wheezing or shortness of breath.    . amphetamine-dextroamphetamine (ADDERALL) 5 MG tablet Take 10 mg by mouth daily as needed (stress).   0  . chlorpheniramine (CHLOR-TRIMETON) 4 MG tablet Take 4 mg by mouth daily as needed for allergies.    . diphenhydrAMINE (BENADRYL) 25 mg capsule Take 25 mg by mouth daily as needed (One hour prior to infusion).     .  famotidine (PEPCID) 10 MG tablet Take 10 mg by mouth 2 (two) times daily as needed for heartburn or indigestion.     Marland Kitchen ibuprofen (ADVIL,MOTRIN) 200 MG tablet Take 400 mg by mouth every 6 (six) hours as needed (for inflammation).    . Liniments (SALONPAS EX) Apply topically as needed.    . Magnesium 250 MG TABS Take 500-1,000 tablets by mouth daily.     . methocarbamol (ROBAXIN) 500 MG tablet Take 1 tablet (500 mg total) by mouth every 6 (six) hours as needed for muscle spasms. 60 tablet 1  . mometasone-formoterol (DULERA) 100-5 MCG/ACT AERO Inhale 1 puff into the lungs daily.     . NONFORMULARY OR COMPOUNDED ITEM Estradiol 0.02% vaginal cream.  Place 1/2 gram vaginally two to three times weekly at bedtime. Disp:  3 month supply (Patient taking differently: Place 0.5 Applicatorfuls vaginally once a week. Estradiol 0.02% vaginal cream.  Place 1/2 gram vaginally two to three times weekly at bedtime. Disp:  3 month supply) 1 each 3  . Omega-3 Fatty Acids (FISH OIL) 1000 MG CAPS Take 1,000 mg by mouth daily.    Marland Kitchen oxyCODONE (OXY IR/ROXICODONE) 5 MG immediate release tablet TK 1/2 T PO Q 4 H PRF PAIN    . promethazine (PHENERGAN) 25 MG tablet Take 25 mg by mouth every 6 (six) hours as needed for nausea or vomiting.    . RESTASIS 0.05 % ophthalmic emulsion Place 1 drop into both eyes every morning.     . Sodium Fluoride 1.1 % PSTE Place 1 application onto teeth 2 (two) times daily.     . temazepam (RESTORIL) 30 MG capsule Take 30 mg by mouth at bedtime.     . Vitamin D, Ergocalciferol, (DRISDOL) 50000 units CAPS capsule Take 1 capsule (50,000 Units total) by mouth every 7 (seven) days. 12 capsule 4  . amoxicillin (AMOXIL) 500 MG capsule Take 2,000 mg by mouth See admin instructions. Take 2000 mg by mouth 1 hour prior to dental appointment  2  . tapentadol (NUCYNTA) 50 MG tablet Take 1 tablet (50 mg total) by mouth every 6 (six) hours as needed for moderate pain. 30 tablet 0  . VENTOLIN HFA 108 (90 BASE)  MCG/ACT inhaler Inhale 2 puffs into the lungs every 4 (four) hours as needed for wheezing or shortness of breath.         Objective:     BP 122/70 (BP Location: Left Arm, Cuff Size: Normal)   Pulse 69   Temp 98.2 F (36.8 C) (Oral)   Ht 5\' 2"  (1.575 m)   Wt 129 lb (58.5 kg)   LMP 02/15/1985 (Approximate)   SpO2 98% Comment: on RA  BMI 23.59 kg/m   SpO2: 98 %(on RA)   Pleasant amb  wf with classic Voice fatigue and mild pseudowheeze  HEENT: nl dentition, turbinates bilaterally, and oropharynx. Nl external ear canals without cough reflex   NECK :  without JVD/Nodes/TM/ nl carotid upstrokes bilaterally   LUNGS: no acc muscle use,  Nl contour chest which is clear to A and P bilaterally without cough on insp or exp maneuvers   CV:  RRR  no s3 or murmur or increase in P2, and no edema   ABD:  soft and nontender with nl inspiratory excursion in the supine position. No bruits or organomegaly appreciated, bowel sounds nl  MS:  Nl gait/ ext warm with classic RA changes mod severe both hands, calf tenderness, cyanosis or clubbing - No obvious joint restrictions   SKIN: warm and dry without lesions    NEURO:  alert, approp, nl sensorium with  no motor or cerebellar deficits apparent.      CXR PA and Lateral:   10/10/2018 :    I personally reviewed images and agree with radiology impression as follows:   No acute findings       Assessment   DOE (dyspnea on exertion) Onset winter 2019 typically pc but also MMRC=1 assoc with voice fatgue - 10/10/2018  After extensive coaching inhaler device,  effectiveness =    75% > try symb 80 2bid as dulera 100 not on her plan   Symptoms of sob at rest > exertion  are  disproportionate to objective findings and not clear to what extent this is actually a pulmonary  problem but pt does appear to have difficult to sort out respiratory symptoms of unknown origin for which  DDX  = almost all start with A and  include Adherence, Ace Inhibitors,  Acid Reflux, Active Sinus Disease, Alpha 1 Antitripsin deficiency, Anxiety masquerading as Airways dz,  ABPA,  Allergy(esp in young), Aspiration (esp in elderly), Adverse effects of meds,  Active smoking or Vaping, A bunch of PE's/clot burden (a few small clots can't cause this syndrome unless there is already severe underlying pulm or vascular dz with poor reserve),  Anemia or thyroid disorder, plus two Bs  = Bronchiectasis and Beta blocker use..and one C= CHF    Adherence is always the initial "prime suspect" and is a multilayered concern that requires a "trust but verify" approach in every patient - starting with knowing how to use medications, especially inhalers, correctly, keeping up with refills and understanding the fundamental difference between maintenance and prns vs those medications only taken for a very short course and then stopped and not refilled.  - see hfa teaching  - return with all meds in hand using a trust but verify approach to confirm accurate Medication  Reconciliation The principal here is that until we are certain that the  patients are doing what we've asked, it makes no sense to ask them to do more.   ? Acid (or non-acid) GERD > always difficult to exclude as up to 75% of pts in some series report no assoc GI/ Heartburn symptoms and she has overt hb but doesn't want to resume ppi > rec max H2 blocker for now  acid suppression and diet restrictions/ reviewed and instructions given in writing.   ? Adverse drug effect > no evidence of ILD either from RA effects or RA drug side effects at this point.   ? 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 Aspiration > no h/o cough to support   ? Anxiety > usually at the bottom of this list of usual suspects but should be included  on this pt's based on H and P for completeness  and may interfere with adherence  and also interpretation of response or lack thereof to symptom management which can be quite subjective.   ? chf > nothing at all to suggest   >>> f/u in 6 weeks with full pfts  Assuming  COVID - 19 restrictions have been lifted or can be mitigated.      Hoarseness with classic voice fatigue 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)     Total time devoted to counseling  > 50 % of initial 60 min office visit:  review case with pt/ I performed device teaching  using a teach back technique which also  extended face to face time for this visit (see above) .discussion of options/alternatives/ personally creating written customized instructions  in presence of pt  then going over those specific  Instructions directly with the pt including how to use all of the meds but in particular covering each new medication in detail and the difference between the maintenance= "automatic" meds and the prns using an action plan format for the latter (If this problem/symptom => do that organization reading Left to right).  Please see AVS from this visit for a full list of these instructions which I personally wrote for this pt and  are unique to this visit.   Christinia Gully, MD 10/10/2018

## 2018-10-10 NOTE — Patient Instructions (Addendum)
Plan A = Automatic = Start symbicort 80 Take 2 puffs first thing in am and then another 2 puffs about 12 hours later.   Work on maintaining optimal  inhaler technique:  relax and gently blow all the way out then take a nice smooth deep breath back in, triggering the inhaler at same time you start breathing in.  Hold for up to 5 seconds if you can. Blow out thru nose. Rinse and gargle with water when done   Plan B = Backup Only use your albuterol inhaler as a rescue medication to be used if you can't catch your breath by resting or doing a relaxed purse lip breathing pattern.  - The less you use it, the better it will work when you need it. - Ok to use the inhaler up to 2 puffs  every 4 hours if you must but call for appointment if use goes up over your usual need - Don't leave home without it !!  (think of it like the spare tire for your car)    GERD (REFLUX)  is an extremely common cause of respiratory symptoms just like yours , many times with no obvious heartburn at all.    It can be treated with medication, but also with lifestyle changes including elevation of the head of your bed (ideally with 6 -8inch blocks under the headboard of your bed),  Smoking cessation, avoidance of late meals, excessive alcohol, and avoid fatty foods, chocolate, peppermint, colas, red wine, and acidic juices such as orange juice.  NO MINT OR MENTHOL PRODUCTS SO NO COUGH DROPS  USE SUGARLESS CANDY INSTEAD (Jolley ranchers or Stover's or Life Savers) or even ice chips will also do - the key is to swallow to prevent all throat clearing. NO OIL BASED VITAMINS - use powdered substitutes.  Avoid fish oil when coughing.    Change pepcid 20 mg to take after bfast and after supper  for 4  weeks    Please remember to go to the  x-ray department  for your tests - we will call you with the results when they are available     Please schedule a follow up office visit in 6 weeks, call sooner if needed  - add needs pfts on  return

## 2018-10-10 NOTE — Assessment & Plan Note (Signed)
Onset winter 2019 typically pc but also MMRC=1 assoc with voice fatgue - 10/10/2018  After extensive coaching inhaler device,  effectiveness =    75% > try symb 80 2bid as dulera 100 not on her plan   Symptoms of sob at rest > exertion  are  disproportionate to objective findings and not clear to what extent this is actually a pulmonary  problem but pt does appear to have difficult to sort out respiratory symptoms of unknown origin for which  DDX  = almost all start with A and  include Adherence, Ace Inhibitors, Acid Reflux, Active Sinus Disease, Alpha 1 Antitripsin deficiency, Anxiety masquerading as Airways dz,  ABPA,  Allergy(esp in young), Aspiration (esp in elderly), Adverse effects of meds,  Active smoking or Vaping, A bunch of PE's/clot burden (a few small clots can't cause this syndrome unless there is already severe underlying pulm or vascular dz with poor reserve),  Anemia or thyroid disorder, plus two Bs  = Bronchiectasis and Beta blocker use..and one C= CHF    Adherence is always the initial "prime suspect" and is a multilayered concern that requires a "trust but verify" approach in every patient - starting with knowing how to use medications, especially inhalers, correctly, keeping up with refills and understanding the fundamental difference between maintenance and prns vs those medications only taken for a very short course and then stopped and not refilled.  - see hfa teaching  - return with all meds in hand using a trust but verify approach to confirm accurate Medication  Reconciliation The principal here is that until we are certain that the  patients are doing what we've asked, it makes no sense to ask them to do more.   ? Acid (or non-acid) GERD > always difficult to exclude as up to 75% of pts in some series report no assoc GI/ Heartburn symptoms and she has overt hb but doesn't want to resume ppi > rec max H2 blocker for now  acid suppression and diet restrictions/ reviewed and  instructions given in writing.   ? Adverse drug effect > no evidence of ILD either from RA effects or RA drug side effects at this point.   ? 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 Aspiration > no h/o cough to support   ? Anxiety > usually at the bottom of this list of usual suspects but should be included  on this pt's based on H and P for completeness  and may interfere with adherence and also interpretation of response or lack thereof to symptom management which can be quite subjective.   ? chf > nothing at all to suggest   >>> f/u in 6 weeks with full pfts  Assuming  COVID - 19 restrictions have been lifted or can be mitigated.

## 2018-10-10 NOTE — Assessment & Plan Note (Addendum)
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)   Total time devoted to counseling  > 50 % of initial 60 min office visit:  review case with pt/ I performed device teaching  using a teach back technique which also  extended face to face time for this visit (see above) .discussion of options/alternatives/ personally creating written customized instructions  in presence of pt  then going over those specific  Instructions directly with the pt including how to use all of the meds but in particular covering each new medication in detail and the difference between the maintenance= "automatic" meds and the prns using an action plan format for the latter (If this problem/symptom => do that organization reading Left to right).  Please see AVS from this visit for a full list of these instructions which I personally wrote for this pt and  are unique to this visit.

## 2018-10-11 ENCOUNTER — Telehealth: Payer: Self-pay | Admitting: Internal Medicine

## 2018-10-11 NOTE — Telephone Encounter (Signed)
  Notes recorded by Tanda Rockers, MD on 10/11/2018 at 11:46 AM EDT  Call pt: Reviewed cxr and no acute change so no change in recommendations made at [facility]  [facility]

## 2018-10-11 NOTE — Progress Notes (Signed)
See 10/11/2018 phone note

## 2018-10-12 NOTE — Telephone Encounter (Signed)
Called and spoke to pt. Informed her of the results and recs per MW. Pt verbalized understanding and denied any further questions or concerns at this time.  ° °

## 2018-10-14 DIAGNOSIS — Z23 Encounter for immunization: Secondary | ICD-10-CM | POA: Diagnosis not present

## 2018-10-25 DIAGNOSIS — M0519 Rheumatoid lung disease with rheumatoid arthritis of multiple sites: Secondary | ICD-10-CM | POA: Diagnosis not present

## 2018-10-25 DIAGNOSIS — M0579 Rheumatoid arthritis with rheumatoid factor of multiple sites without organ or systems involvement: Secondary | ICD-10-CM | POA: Diagnosis not present

## 2018-10-25 NOTE — Addendum Note (Signed)
Addended by: Inis Sizer D on: 10/25/2018 12:18 PM   Modules accepted: Orders

## 2018-10-26 ENCOUNTER — Telehealth: Payer: Self-pay | Admitting: *Deleted

## 2018-10-26 LAB — COMPREHENSIVE METABOLIC PANEL
ALT: 14 IU/L (ref 0–32)
AST: 15 IU/L (ref 0–40)
Albumin/Globulin Ratio: 2 (ref 1.2–2.2)
Albumin: 4.3 g/dL (ref 3.8–4.8)
Alkaline Phosphatase: 72 IU/L (ref 39–117)
BUN/Creatinine Ratio: 21 (ref 12–28)
BUN: 13 mg/dL (ref 8–27)
Bilirubin Total: 0.3 mg/dL (ref 0.0–1.2)
CO2: 23 mmol/L (ref 20–29)
Calcium: 9.6 mg/dL (ref 8.7–10.3)
Chloride: 100 mmol/L (ref 96–106)
Creatinine, Ser: 0.61 mg/dL (ref 0.57–1.00)
GFR calc Af Amer: 108 mL/min/{1.73_m2} (ref >59)
GFR calc non Af Amer: 94 mL/min/{1.73_m2} (ref >59)
Globulin, Total: 2.1 g/dL (ref 1.5–4.5)
Glucose: 102 mg/dL — ABNORMAL HIGH (ref 65–99)
Potassium: 4.2 mmol/L (ref 3.5–5.2)
Sodium: 139 mmol/L (ref 134–144)
Total Protein: 6.4 g/dL (ref 6.0–8.5)

## 2018-10-26 LAB — CBC WITH DIFFERENTIAL/PLATELET
Basophils Absolute: 0 10*3/uL (ref 0.0–0.2)
Basos: 1 %
EOS (ABSOLUTE): 0.3 10*3/uL (ref 0.0–0.4)
Eos: 4 %
Hematocrit: 38 % (ref 34.0–46.6)
Hemoglobin: 12.8 g/dL (ref 11.1–15.9)
Immature Grans (Abs): 0 10*3/uL (ref 0.0–0.1)
Immature Granulocytes: 0 %
Lymphocytes Absolute: 1.8 10*3/uL (ref 0.7–3.1)
Lymphs: 24 %
MCH: 28.4 pg (ref 26.6–33.0)
MCHC: 33.7 g/dL (ref 31.5–35.7)
MCV: 84 fL (ref 79–97)
Monocytes Absolute: 0.5 10*3/uL (ref 0.1–0.9)
Monocytes: 7 %
Neutrophils Absolute: 4.9 10*3/uL (ref 1.4–7.0)
Neutrophils: 64 %
Platelets: 320 10*3/uL (ref 150–450)
RBC: 4.51 x10E6/uL (ref 3.77–5.28)
RDW: 13.3 % (ref 11.7–15.4)
WBC: 7.5 10*3/uL (ref 3.4–10.8)

## 2018-10-26 NOTE — Telephone Encounter (Signed)
-----   Message from Kathrynn Ducking, MD sent at 10/26/2018  7:07 AM EDT -----  The blood work results are unremarkable. Please call the patient. ----- Message ----- From: Hope Pigeon, RN Sent: 10/26/2018   6:55 AM EDT To: Kathrynn Ducking, MD  Dr. Jannifer Franklin- Dr. Felecia Shelling out of office. Can you review? I am happy to contact pt.  Thank you, Terrence Dupont ----- Message ----- From: Lavone Neri Lab Results In Sent: 10/26/2018   5:38 AM EDT To: Britt Bottom, MD

## 2018-10-26 NOTE — Telephone Encounter (Signed)
Called and spoke with pt about lab results. Pt verbalized understanding.  Sent copy to her fax: [phone]. Received confirmation.

## 2018-10-31 NOTE — Progress Notes (Signed)
Kristy Perez - 68 y.o. female MRN [id number]  Date of birth: Aug 28, 1950  Office Visit Note: Visit Date: 09/14/2018 PCP: Kelton Pillar, MD Referred by: Kelton Pillar, MD  Subjective: Chief Complaint  Patient presents with  . Lower Back - Pain   HPI: Kristy Perez is a 68 y.o. female who comes in today For planned right L5-S1 facet joint block.  We saw the patient in October of last year and completed transforaminal epidural injection for disc herniation with some narrowing and she has gone on to have laminectomy discectomy by Dr. Sherley Bounds.  This was in February of this year.  She has pain across the lumbosacral junction mostly on the right side that is more problematic going from sit to stand.  Laying down seems to help she rates her pain as a 5 out of 10.  She actually gets some pain with sitting which would make you think less of a joint problem but we will see diagnostically with the injection today.  Depending on relief would suggest just regrouping with physical therapy.  ROS Otherwise per HPI.  Assessment & Plan: Visit Diagnoses:  1. Spondylosis without myelopathy or radiculopathy, lumbar region   2. Post laminectomy syndrome     Plan: No additional findings.   Meds & Orders:  Meds ordered this encounter  Medications  . methylPREDNISolone acetate (DEPO-MEDROL) injection 80 mg    Orders Placed This Encounter  Procedures  . Facet Injection  . XR C-ARM NO REPORT    Follow-up: No follow-ups on file.   Procedures: No procedures performed  Lumbar Facet Joint Intra-Articular Injection(s) with Fluoroscopic Guidance  [name]      Date of Birth: January 04, 1951 MRN: [id number] PCP: Kelton Pillar, MD      Visit Date: 09/14/2018   Universal Protocol:    Date/Time: 09/14/2018  Consent Given By: the patient  Position: PRONE   Additional Comments: Vital signs were monitored before and after the procedure. Patient was prepped and draped in the usual  sterile fashion. The correct patient, procedure, and site was verified.   Injection Procedure Details:  Procedure Site One Meds Administered:  Meds ordered this encounter  Medications  . methylPREDNISolone acetate (DEPO-MEDROL) injection 80 mg     Laterality: Right  Location/Site:  L5-S1  Needle size: 22 guage  Needle type: Spinal  Needle Placement: Articular  Findings:  -Comments: Excellent flow of contrast producing a partial arthrogram.  Procedure Details: The fluoroscope beam is vertically oriented in AP, and the inferior recess is visualized beneath the lower pole of the inferior apophyseal process, which represents the target point for needle insertion. When direct visualization is difficult the target point is located at the medial projection of the vertebral pedicle. The region overlying each aforementioned target is locally anesthetized with a 1 to 2 ml. volume of 1% Lidocaine without Epinephrine.   The spinal needle was inserted into each of the above mentioned facet joints using biplanar fluoroscopic guidance. A 0.25 to 0.5 ml. volume of Isovue-250 was injected and a partial facet joint arthrogram was obtained. A single spot film was obtained of the resulting arthrogram.    One to 1.25 ml of the steroid/anesthetic solution was then injected into each of the facet joints noted above.   Additional Comments:  The patient tolerated the procedure well Dressing: 2 x 2 sterile gauze and Band-Aid    Post-procedure details: Patient was observed during the procedure. Post-procedure instructions were reviewed.  Patient left the  clinic in stable condition.    Clinical History: MRI LUMBAR SPINE WITHOUT CONTRAST  TECHNIQUE: Multiplanar, multisequence MR imaging of the lumbar spine was performed. No intravenous contrast was administered.  COMPARISON:  08/22/2007  FINDINGS: Segmentation:  Standard.  Alignment: Chronic bilateral L5 pars defects with 8 mm  anterolisthesis of L5 on S1, not significantly changed.  Vertebrae: No fracture, suspicious osseous lesion, or significant marrow edema. Multiple small Schmorl's nodes are again seen scattered throughout the lumbar and lower thoracic spine.  Conus medullaris: Extends to the L1 level and appears normal.  Paraspinal and other soft tissues: Unremarkable.  Disc levels:  Disc desiccation throughout the lumbar spine. Severe disc space narrowing at L5-S1 with mild narrowing at L2-3.  T10-11:  Negative.  T11-12:  New small left central disc protrusion without stenosis.  T12-L1:  Negative.  L1-2:  At most minimal disc bulging without stenosis, unchanged.  L2-3: Progressive disc bulging asymmetric to the right with a right extraforaminal disc protrusion. New mild right neural foraminal stenosis. No definite L2 nerve impingement. No spinal stenosis.  L3-4:  Mild disc bulging without stenosis, unchanged.  L4-5: New minimal disc bulging and mild facet arthrosis without stenosis.  L5-S1: Listhesis, mild bulging of uncovered disc, and disc space height loss result in severe bilateral neural foraminal stenosis, not significantly changed. No spinal stenosis.  IMPRESSION: 1. Chronic L5 pars defects and anterolisthesis with unchanged severe bilateral neural foraminal stenosis. 2. Progressive L2-3 disc degeneration with a new right extraforaminal disc protrusion. Mild right foraminal stenosis. 3. New small T11-12 disc protrusion without stenosis.   Electronically Signed   By: Logan Bores M.D.   On: 09/15/2016 18:25   She reports that she quit smoking about 19 years ago. Her smoking use included cigarettes. She has a 45.00 pack-year smoking history. She has never used smokeless tobacco. No results for input(s): HGBA1C, LABURIC in the last 8760 hours.  Objective:  VS:  HT:    WT:   BMI:     BP:(!) 111/54  HR:70bpm  TEMP: ( )  RESP:  Physical Exam  Ortho Exam  Imaging: No results found.  Past Medical/Family/Surgical/Social History: Medications & Allergies reviewed per EMR, new medications updated. Patient Active Problem List   Diagnosis Date Noted  . Hoarseness with classic voice fatigue 10/10/2018  . S/P lumbar laminectomy 04/06/2018  . Radiculopathy due to lumbar intervertebral disc disorder 12/07/2017  . Congenital spondylolysis 12/07/2017  . Spondylolisthesis of lumbar region 12/07/2017  . DDD (degenerative disc disease), lumbar 12/08/2016  . High risk medication use 03/15/2016  . DJD (degenerative joint disease), cervical 03/15/2016  . History of asthma 03/15/2016  . History of squamous cell carcinoma 03/15/2016  . History of basal cell carcinoma 03/15/2016  . History of breast cancer 03/15/2016  . History of Chiari malformation 03/15/2016  . Vitamin D deficiency 03/15/2016  . Rheumatoid arthritis with rheumatoid factor of multiple sites without organ or systems involvement ([place]) 10/20/2015  . Esophageal reflux 05/01/2015  . Anxiety 05/01/2015  . Sjogrens syndrome ([place]) 05/01/2015  . Chronic obstructive airway disease with asthma ([place]) 05/01/2015  . Deviated nasal septum 05/24/2014    Class: Chronic  . Insomnia 11/25/2007  . DOE (dyspnea on exertion) 11/15/2007   Past Medical History:  Diagnosis Date  . Anemia   . Asthma    inhaler one x per day  . Cancer [facility])    breast, left  . Chiari malformation   . Difficult intubation    cannot hyperextend neck due  to Chiari malformation with tonsillar involvement, RA  . Dysrhythmia 2015   history of PAC's  . GERD (gastroesophageal reflux disease)   . PONV (postoperative nausea and vomiting)   . Rheumatoid arthritis (HCC)    on Humira   Family History  Problem Relation Age of Onset  . Diabetes Mother   . Heart failure Mother   . Bladder Cancer Mother   . Stroke Mother   . Cancer Father        head and neck  . Uterine cancer Maternal Aunt   . Bladder Cancer Maternal  Grandmother   . Colon cancer Maternal Grandmother    Past Surgical History:  Procedure Laterality Date  . BREAST SURGERY Left 2003   Lumpectomy T1C1, NO and negative ER/ PR  . COLONOSCOPY  10/04  . COLONOSCOPY  11/10   normal recheck in 5 years  . COLPORRHAPHY    . ESOPHAGOGASTRODUODENOSCOPY ENDOSCOPY  11/10   GERD  . FOOT SURGERY Left age 55   removal of pseudo rheumatoid nodules from left forefoot.  Marland Kitchen LAPAROSCOPIC BILATERAL SALPINGO OOPHERECTOMY N/A 07/30/2013   Procedure: LAPAROSCOPIC BILATERAL SALPINGO OOPHORECTOMY WITH WASHINGS;  Surgeon: Lyman Speller, MD;  Location: [facility];  Service: Gynecology;  Laterality: N/A;  . LUMBAR LAMINECTOMY/DECOMPRESSION MICRODISCECTOMY Right 04/06/2018   Procedure: Extraforaminal Microdiscectomy - Lumbar Two-Lumbar Three - right;  Surgeon: Eustace Sutphin, MD;  Location: [facility];  Service: Neurosurgery;  Laterality: Right;  Extraforaminal Microdiscectomy - Lumbar Two-Lumbar Three - right  . NASAL SEPTOPLASTY W/ TURBINOPLASTY Bilateral 05/24/2014   Procedure: NASAL SEPTOPLASTY WITH  BILATERAL TURBINATE REDUCTION;  Surgeon: Jerrell Belfast, MD;  Location: [facility];  Service: ENT;  Laterality: Bilateral;  . PELVIC FLOOR REPAIR  3/04   SPARC with AP colporrhaphy  . PILONIDAL CYST EXCISION    . TOTAL VAGINAL HYSTERECTOMY  1989   with AP repair   Social History   Occupational History  . Not on file  Tobacco Use  . Smoking status: Former Smoker    Packs/day: 1.50    Years: 30.00    Pack years: 45.00    Types: Cigarettes    Quit date: 02/16/1999    Years since quitting: 19.7  . Smokeless tobacco: Never Used  Substance and Sexual Activity  . Alcohol use: No  . Drug use: No  . Sexual activity: Not Currently    Partners: Male

## 2018-10-31 NOTE — Procedures (Signed)
Lumbar Facet Joint Intra-Articular Injection(s) with Fluoroscopic Guidance  [name]      Date of Birth: April 04, 1950 MRN: [id number] PCP: Kelton Pillar, MD      Visit Date: 09/14/2018   Universal Protocol:    Date/Time: 09/14/2018  Consent Given By: the patient  Position: PRONE   Additional Comments: Vital signs were monitored before and after the procedure. Patient was prepped and draped in the usual sterile fashion. The correct patient, procedure, and site was verified.   Injection Procedure Details:  Procedure Site One Meds Administered:  Meds ordered this encounter  Medications  . methylPREDNISolone acetate (DEPO-MEDROL) injection 80 mg     Laterality: Right  Location/Site:  L5-S1  Needle size: 22 guage  Needle type: Spinal  Needle Placement: Articular  Findings:  -Comments: Excellent flow of contrast producing a partial arthrogram.  Procedure Details: The fluoroscope beam is vertically oriented in AP, and the inferior recess is visualized beneath the lower pole of the inferior apophyseal process, which represents the target point for needle insertion. When direct visualization is difficult the target point is located at the medial projection of the vertebral pedicle. The region overlying each aforementioned target is locally anesthetized with a 1 to 2 ml. volume of 1% Lidocaine without Epinephrine.   The spinal needle was inserted into each of the above mentioned facet joints using biplanar fluoroscopic guidance. A 0.25 to 0.5 ml. volume of Isovue-250 was injected and a partial facet joint arthrogram was obtained. A single spot film was obtained of the resulting arthrogram.    One to 1.25 ml of the steroid/anesthetic solution was then injected into each of the facet joints noted above.   Additional Comments:  The patient tolerated the procedure well Dressing: 2 x 2 sterile gauze and Band-Aid    Post-procedure details: Patient was observed  during the procedure. Post-procedure instructions were reviewed.  Patient left the clinic in stable condition.

## 2018-11-01 DIAGNOSIS — H1010 Acute atopic conjunctivitis, unspecified eye: Secondary | ICD-10-CM | POA: Diagnosis not present

## 2018-11-21 DIAGNOSIS — M0519 Rheumatoid lung disease with rheumatoid arthritis of multiple sites: Secondary | ICD-10-CM | POA: Diagnosis not present

## 2018-11-22 ENCOUNTER — Ambulatory Visit: Payer: Medicare Other | Admitting: Internal Medicine

## 2018-11-24 ENCOUNTER — Ambulatory Visit: Payer: Medicare Other | Admitting: Internal Medicine

## 2018-11-28 ENCOUNTER — Encounter: Payer: Self-pay | Admitting: Internal Medicine

## 2018-11-28 ENCOUNTER — Other Ambulatory Visit: Payer: Self-pay

## 2018-11-28 ENCOUNTER — Ambulatory Visit (INDEPENDENT_AMBULATORY_CARE_PROVIDER_SITE_OTHER): Payer: Medicare Other | Admitting: Internal Medicine

## 2018-11-28 DIAGNOSIS — R0609 Other forms of dyspnea: Secondary | ICD-10-CM

## 2018-11-28 DIAGNOSIS — R490 Dysphonia: Secondary | ICD-10-CM | POA: Diagnosis not present

## 2018-11-28 DIAGNOSIS — R0600 Dyspnea, unspecified: Secondary | ICD-10-CM | POA: Diagnosis not present

## 2018-11-28 NOTE — Patient Instructions (Addendum)
See Kristy Perez for your hoarseness and possible of CA (cricoaretenoiditis)   Try symbicort 80 dosed just one twice daily (immediate on awakening and 12 hours later)  to see if ex tolerance changes or your need for albuterol goes up and if so resume Take 2 puffs first thing in am and then another 2 puffs about 12 hours later.   Only use your albuterol as a rescue medication to be used if you can't catch your breath by resting or doing a relaxed purse lip breathing pattern.  - The less you use it, the better it will work when you need it. - Ok to use up to 2 puffs  every 4 hours if you must but call for immediate appointment if use goes up over your usual need - Don't leave home without it !!  (think of it like the spare tire for your car)     Please schedule a follow up office visit in 6 weeks, call sooner if needed with PFTs on return

## 2018-11-28 NOTE — Progress Notes (Signed)
Kristy Perez, female    DOB: 06/14/1950,     MRN: [id number]   Brief patient profile:  46 yowf with onset feet aching mid 1990s eventually dx as RA and some AB on saba while smoking 2001 and  And AB apparently resolved completely off cigs for a few years but then started needing more saba so rx   dulera 100 just one puff daily   Which eliminated need for saba   and did fine until around winter 2019 sob pc every daily either lunch=supper not related to quantity or quality of food and  not better p saba so referred to pulmonary clinic 10/10/2018 by Dr   Estanislado Pandy.  Was on ppi until around 2015 and pepcid and daily has sensation of hb worse since stopped ppi as "worried about the cost and reported side effects.   History of Present Illness  10/10/2018  Pulmonary/ 1st office eval/Jaeliana Lococo  Chief Complaint  Patient presents with  . Pulmonary Consult    Referred by Dr. Estanislado Pandy for eval of SOB. [name].   Dyspnea:  MMRC1 = can walk nl pace, flat grade, can't hurry or go uphills or steps s sob   Cough: no Sleep: fine p temazepam/ one pillow on side bed flat no resp c/os SABA use: none now rec Plan A = Automatic = Start symbicort 80 Take 2 puffs first thing in am and then another 2 puffs about 12 hours later.  Work on maintaining optimal  inhaler technique: Plan B = Backup Only use your albuterol inhaler as a rescue medication  Change pepcid 20 mg to take after bfast and after supper  for 4  weeks       11/28/2018  f/u ov/Ovella [place] re: RA, hoarseness, unexplained sob improved on symb Chief Complaint  Patient presents with  . Follow-up    Breathing is overall doing well. [name].   Dyspnea:  Able to walk up 4 miles flat ok,  One flight and sob "panting" x 3 years no change on symb 80 = MMRC1 = can walk nl pace, flat grade, can't hurry or go uphills or steps s sob   Better with meals now  Cough:  hoarseness p symb Sleeping: fine after temazepam SABA use: one daily s pattern occurs at rest not noct  02: none    No obvious day to day or daytime variability or assoc excess/ purulent sputum or mucus plugs or hemoptysis or cp or chest tightness, subjective wheeze or overt sinus or hb symptoms.   Sleeping  without nocturnal  or early am exacerbation  of respiratory  c/o's or need for noct saba. Also denies any obvious fluctuation of symptoms with weather or environmental changes or other aggravating or alleviating factors except as outlined above   No unusual exposure hx or h/o childhood pna/ asthma or knowledge of premature birth.  Current Allergies, Complete Past Medical History, Past Surgical History, Family History, and Social History were reviewed in [employer] record.  ROS  The following are not active complaints unless bolded Hoarseness, sore throat, dysphagia, dental problems, itching, sneezing,  nasal congestion or discharge of excess mucus or purulent secretions, ear ache,   fever, chills, sweats, unintended wt loss or wt gain, classically pleuritic or exertional cp,  orthopnea pnd or arm/hand swelling  or leg swelling, presyncope, palpitations, abdominal pain, anorexia, nausea, vomiting, diarrhea  or change in bowel habits or change in bladder habits, change in stools or change in urine, dysuria, hematuria,  rash, arthralgias, visual complaints, headache, numbness, weakness or ataxia or problems with walking or coordination,  change in mood or  memory.        Current Meds  Medication Sig  . Abatacept (ORENCIA IV) Inject 500 mg into the vein every 30 (thirty) days. Infuse 2 vials / 500 mg over 30 minutes every 4 weeks  . acetaminophen (TYLENOL) 325 MG tablet Take 650 mg by mouth daily as needed (One hour prior to infusion).   Marland Kitchen albuterol (PROAIR HFA) 108 (90 Base) MCG/ACT inhaler Inhale 2 puffs into the lungs every 6 (six) hours as needed for wheezing or  shortness of breath.  Marland Kitchen amoxicillin (AMOXIL) 500 MG capsule Take 2,000 mg by mouth See admin instructions. Take 2000 mg by mouth 1 hour prior to dental appointment  . amphetamine-dextroamphetamine (ADDERALL) 5 MG tablet Take 10 mg by mouth daily as needed (stress).   . budesonide-formoterol (SYMBICORT) 80-4.5 MCG/ACT inhaler Take 2 puffs first thing in am and then another 2 puffs about 12 hours later.  . chlorpheniramine (CHLOR-TRIMETON) 4 MG tablet Take 4 mg by mouth daily as needed for allergies.  . diphenhydrAMINE (BENADRYL) 25 mg capsule Take 25 mg by mouth daily as needed (One hour prior to infusion).   . famotidine (PEPCID) 20 MG tablet One after  bfast and supper  . ibuprofen (ADVIL,MOTRIN) 200 MG tablet Take 400 mg by mouth every 6 (six) hours as needed (for inflammation).  . Liniments (SALONPAS EX) Apply topically as needed.  . Magnesium 250 MG TABS Take 500-1,000 tablets by mouth daily.   . methocarbamol (ROBAXIN) 500 MG tablet Take 1 tablet (500 mg total) by mouth every 6 (six) hours as needed for muscle spasms.  . NONFORMULARY OR COMPOUNDED ITEM Estradiol 0.02% vaginal cream.  Place 1/2 gram vaginally two to three times weekly at bedtime. Disp:  3 month supply (Patient taking differently: Place 0.5 Applicatorfuls vaginally once a week. Estradiol 0.02% vaginal cream.  Place 1/2 gram vaginally two to three times weekly at bedtime. Disp:  3 month supply)  . Omega-3 Fatty Acids (FISH OIL) 1000 MG CAPS Take 1,000 mg by mouth daily.  Marland Kitchen oxyCODONE (OXY IR/ROXICODONE) 5 MG immediate release tablet TK 1/2 T PO Q 4 H PRF PAIN  . promethazine (PHENERGAN) 25 MG tablet Take 25 mg by mouth every 6 (six) hours as needed for nausea or vomiting.  . RESTASIS 0.05 % ophthalmic emulsion Place 1 drop into both eyes every morning.   . Sodium Fluoride 1.1 % PSTE Place 1 application onto teeth 2 (two) times daily.   . temazepam (RESTORIL) 30 MG capsule Take 30 mg by mouth at bedtime.   . Vitamin D,  Ergocalciferol, (DRISDOL) 50000 units CAPS capsule Take 1 capsule (50,000 Units total) by mouth every 7 (seven) days.           Past Medical History:  Diagnosis Date  . Anemia   . Asthma    inhaler one x per day  . Cancer [facility])    breast, left  . Chiari malformation   . Difficult intubation    cannot hyperextend neck due to Chiari malformation with tonsillar involvement, RA  . Dysrhythmia 2015   history of PAC's  . GERD (gastroesophageal reflux disease)   . PONV (postoperative nausea and vomiting)   . Rheumatoid arthritis ([facility]CC)    on Humira  Outpatient Medications Prior to Visit  Medication Sig Dispense Refill  . Abatacept (ORENCIA IV) Inject 500 mg into the vein every 30 (thirty) days. Infuse 2 vials / 500 mg over 30 minutes every 4 weeks    . acetaminophen (TYLENOL) 325 MG tablet Take 650 mg by mouth daily as needed (One hour prior to infusion).     Marland Kitchen albuterol (PROAIR HFA) 108 (90 Base) MCG/ACT inhaler Inhale 2 puffs into the lungs every 6 (six) hours as needed for wheezing or shortness of breath.    . amphetamine-dextroamphetamine (ADDERALL) 5 MG tablet Take 10 mg by mouth daily as needed (stress).   0  . chlorpheniramine (CHLOR-TRIMETON) 4 MG tablet Take 4 mg by mouth daily as needed for allergies.    . diphenhydrAMINE (BENADRYL) 25 mg capsule Take 25 mg by mouth daily as needed (One hour prior to infusion).     . famotidine (PEPCID) 10 MG tablet Take 10 mg by mouth 2 (two) times daily as needed for heartburn or indigestion.     Marland Kitchen ibuprofen (ADVIL,MOTRIN) 200 MG tablet Take 400 mg by mouth every 6 (six) hours as needed (for inflammation).    . Liniments (SALONPAS EX) Apply topically as needed.    . Magnesium 250 MG TABS Take 500-1,000 tablets by mouth daily.     . methocarbamol (ROBAXIN) 500 MG tablet Take 1 tablet (500 mg total) by mouth every 6 (six) hours as needed for muscle spasms. 60 tablet 1  . mometasone-formoterol (DULERA) 100-5 MCG/ACT AERO Inhale 1 puff into the  lungs daily.     . NONFORMULARY OR COMPOUNDED ITEM Estradiol 0.02% vaginal cream.  Place 1/2 gram vaginally two to three times weekly at bedtime. Disp:  3 month supply (Patient taking differently: Place 0.5 Applicatorfuls vaginally once a week. Estradiol 0.02% vaginal cream.  Place 1/2 gram vaginally two to three times weekly at bedtime. Disp:  3 month supply) 1 each 3  . Omega-3 Fatty Acids (FISH OIL) 1000 MG CAPS Take 1,000 mg by mouth daily.    Marland Kitchen oxyCODONE (OXY IR/ROXICODONE) 5 MG immediate release tablet TK 1/2 T PO Q 4 H PRF PAIN    . promethazine (PHENERGAN) 25 MG tablet Take 25 mg by mouth every 6 (six) hours as needed for nausea or vomiting.    . RESTASIS 0.05 % ophthalmic emulsion Place 1 drop into both eyes every morning.     . Sodium Fluoride 1.1 % PSTE Place 1 application onto teeth 2 (two) times daily.     . temazepam (RESTORIL) 30 MG capsule Take 30 mg by mouth at bedtime.     . Vitamin D, Ergocalciferol, (DRISDOL) 50000 units CAPS capsule Take 1 capsule (50,000 Units total) by mouth every 7 (seven) days. 12 capsule 4  . amoxicillin (AMOXIL) 500 MG capsule Take 2,000 mg by mouth See admin instructions. Take 2000 mg by mouth 1 hour prior to dental appointment  2  . tapentadol (NUCYNTA) 50 MG tablet Take 1 tablet (50 mg total) by mouth every 6 (six) hours as needed for moderate pain. 30 tablet 0  . VENTOLIN HFA 108 (90 BASE) MCG/ACT inhaler Inhale 2 puffs into the lungs every 4 (four) hours as needed for wheezing or shortness of breath.         Objective:     Pleasant amb wf with mod hoarsness, voice fatigue   Wt Readings from Last 3 Encounters:  11/28/18 129 lb 3.2 oz (58.6 kg)  10/10/18 129 lb (58.5 kg)  08/22/18 128 lb 9.6 oz (58.3 kg)     Vital signs reviewed - Note on arrival 02 sats  98% on RA       HEENT : pt wearing mask not removed for exam due to covid -19 concerns.    NECK :  without JVD/Nodes/TM/ nl carotid upstrokes bilaterally   LUNGS: no acc muscle use,   Nl contour chest which is clear to A and P bilaterally without cough on insp or exp maneuvers   CV:  RRR  no s3 or murmur or increase in P2, and no edema   ABD:  soft and nontender with nl inspiratory excursion in the supine position. No bruits or organomegaly appreciated, bowel sounds nl  MS:  Nl gait/ ext warm with RA changes both hands, calf tenderness, cyanosis or clubbing No obvious joint restrictions   SKIN: warm and dry without lesions    NEURO:  alert, approp, nl sensorium with  no motor or cerebellar deficits apparent.          Assessment

## 2018-12-01 ENCOUNTER — Encounter: Payer: Self-pay | Admitting: Internal Medicine

## 2018-12-01 NOTE — Assessment & Plan Note (Signed)
Onset summer 2019  - 10/10/2018 rx max rx for gerd > no better 11/28/2018 so rec  shoemaker eval     I had an extended discussion with the patient   reviewing all relevant studies completed to date and  lasting 15 to 20 minutes of a 25 minute visit  which included directly observing ambulatory 02 saturation study documented in a/p section of  today's  office note.  I performed device teaching  using a teach back technique which also  extended face to face time for this visit (see above)     Each maintenance medication was reviewed in detail including most importantly the difference between maintenance and prns and under what circumstances the prns are to be triggered using an action plan format that is not reflected in the computer generated alphabetically organized AVS.     Please see AVS for specific instructions unique to this visit that I personally wrote and verbalized to the the pt in detail and then reviewed with pt  by my nurse highlighting any changes in therapy recommended at today's visit .

## 2018-12-01 NOTE — Assessment & Plan Note (Addendum)
Onset winter 2019 typically pc but also MMRC=1 assoc with voice fatgue - 10/10/2018  After extensive coaching inhaler device,  effectiveness =    75% > try symb 80 2bid as dulera 100 not on her plan   - 11/28/2018   [facility] RA  2 laps @  approx 23ft each @ moderate pace  stopped due to  End of study, no sob with sats 94%  - 11/28/2018  After extensive coaching inhaler device,  effectiveness =    75% try to reduce sym 80 to 1 bid to see if any change hoarsness, sob, saba need   Needs to return with full pfts due to RA and need to look at both upper airway, lower airway and int lung measurements in this setting.

## 2018-12-05 ENCOUNTER — Encounter: Payer: Self-pay | Admitting: Obstetrics & Gynecology

## 2018-12-05 DIAGNOSIS — Z1231 Encounter for screening mammogram for malignant neoplasm of breast: Secondary | ICD-10-CM | POA: Diagnosis not present

## 2018-12-05 DIAGNOSIS — Z853 Personal history of malignant neoplasm of breast: Secondary | ICD-10-CM | POA: Diagnosis not present

## 2018-12-12 DIAGNOSIS — H10413 Chronic giant papillary conjunctivitis, bilateral: Secondary | ICD-10-CM | POA: Diagnosis not present

## 2018-12-12 DIAGNOSIS — R0602 Shortness of breath: Secondary | ICD-10-CM | POA: Diagnosis not present

## 2018-12-12 DIAGNOSIS — M059 Rheumatoid arthritis with rheumatoid factor, unspecified: Secondary | ICD-10-CM | POA: Diagnosis not present

## 2018-12-12 DIAGNOSIS — H5203 Hypermetropia, bilateral: Secondary | ICD-10-CM | POA: Diagnosis not present

## 2018-12-12 DIAGNOSIS — R490 Dysphonia: Secondary | ICD-10-CM | POA: Diagnosis not present

## 2018-12-12 DIAGNOSIS — M3500 Sicca syndrome, unspecified: Secondary | ICD-10-CM | POA: Diagnosis not present

## 2018-12-12 DIAGNOSIS — R1314 Dysphagia, pharyngoesophageal phase: Secondary | ICD-10-CM | POA: Diagnosis not present

## 2018-12-21 ENCOUNTER — Other Ambulatory Visit: Payer: Self-pay

## 2018-12-21 ENCOUNTER — Other Ambulatory Visit: Payer: Self-pay | Admitting: *Deleted

## 2018-12-21 DIAGNOSIS — M0519 Rheumatoid lung disease with rheumatoid arthritis of multiple sites: Secondary | ICD-10-CM | POA: Diagnosis not present

## 2018-12-21 DIAGNOSIS — M0579 Rheumatoid arthritis with rheumatoid factor of multiple sites without organ or systems involvement: Secondary | ICD-10-CM

## 2018-12-22 LAB — CBC WITH DIFFERENTIAL/PLATELET
Basophils Absolute: 0.1 10*3/uL (ref 0.0–0.2)
Basos: 1 %
EOS (ABSOLUTE): 0.3 10*3/uL (ref 0.0–0.4)
Eos: 4 %
Hematocrit: 36.5 % (ref 34.0–46.6)
Hemoglobin: 11.9 g/dL (ref 11.1–15.9)
Immature Grans (Abs): 0 10*3/uL (ref 0.0–0.1)
Immature Granulocytes: 0 %
Lymphocytes Absolute: 1.9 10*3/uL (ref 0.7–3.1)
Lymphs: 26 %
MCH: 28.2 pg (ref 26.6–33.0)
MCHC: 32.6 g/dL (ref 31.5–35.7)
MCV: 87 fL (ref 79–97)
Monocytes Absolute: 0.5 10*3/uL (ref 0.1–0.9)
Monocytes: 7 %
Neutrophils Absolute: 4.7 10*3/uL (ref 1.4–7.0)
Neutrophils: 62 %
Platelets: 383 10*3/uL (ref 150–450)
RBC: 4.22 x10E6/uL (ref 3.77–5.28)
RDW: 13.4 % (ref 11.7–15.4)
WBC: 7.4 10*3/uL (ref 3.4–10.8)

## 2018-12-22 LAB — COMPREHENSIVE METABOLIC PANEL
ALT: 15 IU/L (ref 0–32)
AST: 15 IU/L (ref 0–40)
Albumin/Globulin Ratio: 2.5 — ABNORMAL HIGH (ref 1.2–2.2)
Albumin: 4.5 g/dL (ref 3.8–4.8)
Alkaline Phosphatase: 74 IU/L (ref 39–117)
BUN/Creatinine Ratio: 23 (ref 12–28)
BUN: 13 mg/dL (ref 8–27)
Bilirubin Total: 0.3 mg/dL (ref 0.0–1.2)
CO2: 21 mmol/L (ref 20–29)
Calcium: 9.4 mg/dL (ref 8.7–10.3)
Chloride: 103 mmol/L (ref 96–106)
Creatinine, Ser: 0.57 mg/dL (ref 0.57–1.00)
GFR calc Af Amer: 111 mL/min/{1.73_m2} (ref >59)
GFR calc non Af Amer: 96 mL/min/{1.73_m2} (ref >59)
Globulin, Total: 1.8 g/dL (ref 1.5–4.5)
Glucose: 103 mg/dL — ABNORMAL HIGH (ref 65–99)
Potassium: 4.2 mmol/L (ref 3.5–5.2)
Sodium: 138 mmol/L (ref 134–144)
Total Protein: 6.3 g/dL (ref 6.0–8.5)

## 2018-12-25 ENCOUNTER — Telehealth: Payer: Self-pay | Admitting: *Deleted

## 2018-12-25 NOTE — Telephone Encounter (Signed)
-----   Message from Britt Bottom, MD sent at 12/23/2018 11:04 AM EST ----- Please let the patient know that the lab work is fine.

## 2018-12-25 NOTE — Telephone Encounter (Signed)
Called and spoke w/ pt. Relayed results per Dr. Felecia Shelling note. Faxed results to her at [phone]. Received confirmation.

## 2018-12-26 DIAGNOSIS — H10413 Chronic giant papillary conjunctivitis, bilateral: Secondary | ICD-10-CM | POA: Diagnosis not present

## 2019-01-17 DIAGNOSIS — M0519 Rheumatoid lung disease with rheumatoid arthritis of multiple sites: Secondary | ICD-10-CM | POA: Diagnosis not present

## 2019-01-19 NOTE — Progress Notes (Signed)
Virtual Visit via Telephone Note  I connected with Kristy Perez on 01/22/19 at  9:15 AM EST by telephone and verified that I am speaking with the correct person using two identifiers.  Location: Patient: Home  Provider: Clinic  This service was conducted via virtual visit.  Both audio and visual tools were used.  The patient was located at home. I was located in my office.  Consent was obtained prior to the virtual visit and is aware of possible charges through their insurance for this visit.  The patient is an established patient.  Dr. Estanislado Pandy, MD conducted the virtual visit and Hazel Sams, PA-C acted as scribe during the service.  Office staff helped with scheduling follow up visits after the service was conducted.   I discussed the limitations, risks, security and privacy concerns of performing an evaluation and management service by telephone and the availability of in person appointments. I also discussed with the patient that there may be a patient responsible charge related to this service. The patient expressed understanding and agreed to proceed.  CC: Medication monitoring  History of Present Illness: Patient is a 68 year old female with a past medical history of seropositive rheumatoid arthritis and DDD.  She receives IV Orencia infusions 500 mg every 28 days.  She has not missed any doses of Orencia recently. She has not had any recent rheumatoid arthritis flares. She denies any increased joint pain or joint swelling.  She denies any morning stiffness or nocturnal pain currently.  She is having increased lower back pain and plans on following up with Dr. Ernestina Patches.   She has an appointment with her PCP today and is planning on having her PCP order an updated DEXA. She does not want to start on Reclast at this time.    Review of Systems  Constitutional: Positive for malaise/fatigue. Negative for fever.  HENT:       +Dry mouth  Eyes: Negative for photophobia, pain, discharge and  redness.       +Dry Eyes  Respiratory: Positive for shortness of breath (She is seeing Dr. Melvyn Novas). Negative for cough and wheezing.   Cardiovascular: Negative for chest pain and palpitations.  Gastrointestinal: Negative for blood in stool, constipation and diarrhea.  Genitourinary: Negative for dysuria.  Musculoskeletal: Negative for back pain, joint pain, myalgias and neck pain.  Skin: Negative for rash.  Neurological: Negative for dizziness and headaches.  Psychiatric/Behavioral: Negative for depression. The patient is not nervous/anxious and does not have insomnia.       Observations/Objective: Physical Exam  Constitutional: She is oriented to person, place, and time.  Neurological: She is alert and oriented to person, place, and time.  Psychiatric: Mood, memory, affect and judgment normal.   Patient reports morning stiffness for 0  minutes.   Patient denies nocturnal pain.  Difficulty dressing/grooming: Denies Difficulty climbing stairs: Denies Difficulty getting out of chair: Denies Difficulty using hands for taps, buttons, cutlery, and/or writing: Reports    Assessment and Plan: Visit Diagnoses: Rheumatoid arthritis with rheumatoid factor of multiple sites without organ or systems involvement ([place]) - +RF, +CCP: She has not had any recent rheumatoid arthritis flares.  She has no joint pain or joint swelling at this time.  She has no morning stiffness.  She experiences increased fatigue, arthralgias, and nocturnal pain about 1 week prior to her next infusions but has no noticed any worsening symptoms.  She is clinically doing well on Orencia IV infusions 500 mg every 28 days.  She will continue on this current treatment regimen.  She was advised to notify us if she develops increased joint pain or joint swelling.  She will follow up in 3-4 months.  High risk medication use -Orencia IV infusion 500 mg every 28 days at [facility].  Last TB gold negative on 10/19/2017 and is overdue. Future  order for TB gold is placed today. CBC and CMP were drawn on 12/21/18. (inadequate response to Enbrel and Humira)   DDD (degenerative disc disease), cervical: She is not having any neck pain or stiffness at this time.  No symptoms of radiculopathy    DDD (degenerative disc disease), lumbar - Chronic pain.  S/p lumbar laminectomy on 04/06/18 by Dr. Ronnald Ramp. She is planning on following up with Dr. Ernestina Patches due to increased symptoms of radiculopathy.   Age-related osteoporosis without current pathological fracture -She is not currently on therapy.  She takes a vitamin D supplement daily. We will check vitamin D with upcoming lab work. Last DEXA ordered by PCP in November 2018 showed T score of -2.6 and BMD of 0.558 at left hip and -3% change compared to 2016.  Due for repeat DEXA November 2020.  She plans on having PCP order DEXA.  She does not want to start on Reclast infusions since she frequently has to have invasive dental procedures.    Vitamin D deficiency - She is taking a vitamin D supplement. Vitamin D level will be checked today.   Primary insomnia - She takes Restoril 30 mg po at bedtime for insomnia.   Trochanteric bursitis of right hip - Resolved.   Dyspnea on exertion: She is seeing Dr. Melvyn Novas.  Other medical conditions are listed as follows:  History of asthma-Childhood  History of Chiari malformation   Gastroesophageal reflux disease without esophagitis  History of breast cancer   History of basal cell carcinoma   History of squamous cell carcinoma   Follow Up Instructions: She will follow up in 3-4 months.    I discussed the assessment and treatment plan with the patient. The patient was provided an opportunity to ask questions and all were answered. The patient agreed with the plan and demonstrated an understanding of the instructions.   The patient was advised to call back or seek an in-person evaluation if the symptoms worsen or if the condition fails to  improve as anticipated.  I provided 15 minutes of non-face-to-face time during this encounter.   Bo Merino, MD  Scribed by-  Hazel Sams, PA-C

## 2019-01-22 ENCOUNTER — Encounter: Payer: Self-pay | Admitting: Rheumatology

## 2019-01-22 ENCOUNTER — Telehealth (INDEPENDENT_AMBULATORY_CARE_PROVIDER_SITE_OTHER): Payer: Medicare Other | Admitting: Rheumatology

## 2019-01-22 ENCOUNTER — Other Ambulatory Visit: Payer: Self-pay

## 2019-01-22 DIAGNOSIS — K219 Gastro-esophageal reflux disease without esophagitis: Secondary | ICD-10-CM | POA: Diagnosis not present

## 2019-01-22 DIAGNOSIS — M5030 Other cervical disc degeneration, unspecified cervical region: Secondary | ICD-10-CM

## 2019-01-22 DIAGNOSIS — M7061 Trochanteric bursitis, right hip: Secondary | ICD-10-CM

## 2019-01-22 DIAGNOSIS — Z8589 Personal history of malignant neoplasm of other organs and systems: Secondary | ICD-10-CM

## 2019-01-22 DIAGNOSIS — Z8709 Personal history of other diseases of the respiratory system: Secondary | ICD-10-CM

## 2019-01-22 DIAGNOSIS — Z79899 Other long term (current) drug therapy: Secondary | ICD-10-CM | POA: Diagnosis not present

## 2019-01-22 DIAGNOSIS — E559 Vitamin D deficiency, unspecified: Secondary | ICD-10-CM | POA: Diagnosis not present

## 2019-01-22 DIAGNOSIS — Z85828 Personal history of other malignant neoplasm of skin: Secondary | ICD-10-CM

## 2019-01-22 DIAGNOSIS — Z0000 Encounter for general adult medical examination without abnormal findings: Secondary | ICD-10-CM | POA: Diagnosis not present

## 2019-01-22 DIAGNOSIS — Z853 Personal history of malignant neoplasm of breast: Secondary | ICD-10-CM | POA: Diagnosis not present

## 2019-01-22 DIAGNOSIS — M5136 Other intervertebral disc degeneration, lumbar region: Secondary | ICD-10-CM | POA: Diagnosis not present

## 2019-01-22 DIAGNOSIS — M0579 Rheumatoid arthritis with rheumatoid factor of multiple sites without organ or systems involvement: Secondary | ICD-10-CM

## 2019-01-22 DIAGNOSIS — M810 Age-related osteoporosis without current pathological fracture: Secondary | ICD-10-CM

## 2019-01-22 DIAGNOSIS — Z8669 Personal history of other diseases of the nervous system and sense organs: Secondary | ICD-10-CM | POA: Diagnosis not present

## 2019-01-22 DIAGNOSIS — J45909 Unspecified asthma, uncomplicated: Secondary | ICD-10-CM | POA: Diagnosis not present

## 2019-01-22 DIAGNOSIS — F5101 Primary insomnia: Secondary | ICD-10-CM | POA: Diagnosis not present

## 2019-01-22 DIAGNOSIS — F988 Other specified behavioral and emotional disorders with onset usually occurring in childhood and adolescence: Secondary | ICD-10-CM | POA: Diagnosis not present

## 2019-01-22 DIAGNOSIS — M069 Rheumatoid arthritis, unspecified: Secondary | ICD-10-CM | POA: Diagnosis not present

## 2019-01-22 DIAGNOSIS — Z1389 Encounter for screening for other disorder: Secondary | ICD-10-CM | POA: Diagnosis not present

## 2019-01-22 DIAGNOSIS — G4701 Insomnia due to medical condition: Secondary | ICD-10-CM | POA: Diagnosis not present

## 2019-01-23 ENCOUNTER — Other Ambulatory Visit: Payer: Self-pay

## 2019-01-23 ENCOUNTER — Telehealth: Payer: Medicare Other | Admitting: Rheumatology

## 2019-01-23 DIAGNOSIS — E559 Vitamin D deficiency, unspecified: Secondary | ICD-10-CM | POA: Diagnosis not present

## 2019-01-23 DIAGNOSIS — Z79899 Other long term (current) drug therapy: Secondary | ICD-10-CM | POA: Diagnosis not present

## 2019-01-23 DIAGNOSIS — M810 Age-related osteoporosis without current pathological fracture: Secondary | ICD-10-CM | POA: Diagnosis not present

## 2019-01-24 NOTE — Progress Notes (Signed)
Vitamin D is WNL.  Please advise patient to continue a maintenance dose of Vitamin D.

## 2019-01-25 ENCOUNTER — Other Ambulatory Visit (HOSPITAL_COMMUNITY): Payer: Medicare Other

## 2019-01-25 LAB — QUANTIFERON-TB GOLD PLUS
Mitogen-NIL: >10 IU/mL
NIL: 0.04 IU/mL
QuantiFERON-TB Gold Plus: NEGATIVE
TB1-NIL: 0 IU/mL
TB2-NIL: 0.01 IU/mL

## 2019-01-25 LAB — VITAMIN D 25 HYDROXY (VIT D DEFICIENCY, FRACTURES): Vit D, 25-Hydroxy: 36 ng/mL (ref 30–100)

## 2019-01-26 NOTE — Progress Notes (Signed)
TB gold negative

## 2019-01-29 ENCOUNTER — Ambulatory Visit: Payer: Medicare Other | Admitting: Internal Medicine

## 2019-02-02 ENCOUNTER — Telehealth: Payer: Self-pay | Admitting: Rheumatology

## 2019-02-02 NOTE — Telephone Encounter (Signed)
-----   Message from Shona Needles, RT sent at 01/23/2019 12:20 PM EST ----- Regarding: 3-4 MONTH F/U

## 2019-02-02 NOTE — Telephone Encounter (Signed)
LMOM for patient to call and schedule 4 month follow-up appointment (around 05/23/2019) for RA, Osteoarthritis

## 2019-02-12 ENCOUNTER — Encounter: Payer: Self-pay | Admitting: Rheumatology

## 2019-03-07 ENCOUNTER — Telehealth: Payer: Self-pay | Admitting: Rheumatology

## 2019-03-07 NOTE — Telephone Encounter (Signed)
-----   Message from Shona Needles, RT sent at 02/21/2019  5:41 PM EST ----- Regarding: BONE DENSITY - SCHEDULE APPT TO DISCUSS TREATMENT OPTIONS/DEXA IN [facility] Pickup

## 2019-03-07 NOTE — Telephone Encounter (Signed)
I LMOM for patient to call, and schedule a follow up appointment to discuss treatment options post cone density.

## 2019-03-08 ENCOUNTER — Telehealth: Payer: Self-pay | Admitting: Rheumatology

## 2019-03-08 NOTE — Telephone Encounter (Signed)
Patient left a voicemail stating she received a call to schedule an appointment to discuss taking Calcium and "I am not really interested in that."  Patient stated "I am also discussing that with my PCP so if you need something else, you can call me back, but otherwise it is being handled."

## 2019-03-08 NOTE — Telephone Encounter (Signed)
Patient advised that we ere reaching out to schedule and appointment to discuss treatment options for her bone density scan. Patient states she has a follow up appointment on 04/26/19. Patient states is unable to take Boniva and Fosamax. Patient states she is not interested in infusion due to the "jaw issues it could cause".

## 2019-03-08 NOTE — Telephone Encounter (Signed)
We will further discuss treatment options at her follow up visit on 04/26/18.

## 2019-03-14 DIAGNOSIS — M0519 Rheumatoid lung disease with rheumatoid arthritis of multiple sites: Secondary | ICD-10-CM | POA: Diagnosis not present

## 2019-03-14 DIAGNOSIS — M0579 Rheumatoid arthritis with rheumatoid factor of multiple sites without organ or systems involvement: Secondary | ICD-10-CM | POA: Diagnosis not present

## 2019-03-14 NOTE — Addendum Note (Signed)
Addended by: Inis Sizer D on: 03/14/2019 11:00 AM   Modules accepted: Orders

## 2019-03-15 ENCOUNTER — Telehealth: Payer: Self-pay | Admitting: *Deleted

## 2019-03-15 LAB — COMPREHENSIVE METABOLIC PANEL
ALT: 17 IU/L (ref 0–32)
AST: 26 IU/L (ref 0–40)
Albumin/Globulin Ratio: 2 (ref 1.2–2.2)
Albumin: 4.5 g/dL (ref 3.8–4.8)
Alkaline Phosphatase: 83 IU/L (ref 39–117)
BUN/Creatinine Ratio: 22 (ref 12–28)
BUN: 13 mg/dL (ref 8–27)
Bilirubin Total: 0.3 mg/dL (ref 0.0–1.2)
CO2: 22 mmol/L (ref 20–29)
Calcium: 9.1 mg/dL (ref 8.7–10.3)
Chloride: 102 mmol/L (ref 96–106)
Creatinine, Ser: 0.59 mg/dL (ref 0.57–1.00)
GFR calc Af Amer: 110 mL/min/{1.73_m2} (ref >59)
GFR calc non Af Amer: 95 mL/min/{1.73_m2} (ref >59)
Globulin, Total: 2.2 g/dL (ref 1.5–4.5)
Glucose: 101 mg/dL — ABNORMAL HIGH (ref 65–99)
Potassium: 4.2 mmol/L (ref 3.5–5.2)
Sodium: 140 mmol/L (ref 134–144)
Total Protein: 6.7 g/dL (ref 6.0–8.5)

## 2019-03-15 LAB — CBC WITH DIFFERENTIAL/PLATELET
Basophils Absolute: 0 10*3/uL (ref 0.0–0.2)
Basos: 1 %
EOS (ABSOLUTE): 0.3 10*3/uL (ref 0.0–0.4)
Eos: 3 %
Hematocrit: 40 % (ref 34.0–46.6)
Hemoglobin: 13.5 g/dL (ref 11.1–15.9)
Immature Grans (Abs): 0 10*3/uL (ref 0.0–0.1)
Immature Granulocytes: 0 %
Lymphocytes Absolute: 2.1 10*3/uL (ref 0.7–3.1)
Lymphs: 25 %
MCH: 28.7 pg (ref 26.6–33.0)
MCHC: 33.8 g/dL (ref 31.5–35.7)
MCV: 85 fL (ref 79–97)
Monocytes Absolute: 0.6 10*3/uL (ref 0.1–0.9)
Monocytes: 7 %
Neutrophils Absolute: 5.4 10*3/uL (ref 1.4–7.0)
Neutrophils: 64 %
Platelets: 367 10*3/uL (ref 150–450)
RBC: 4.71 x10E6/uL (ref 3.77–5.28)
RDW: 12.8 % (ref 11.7–15.4)
WBC: 8.4 10*3/uL (ref 3.4–10.8)

## 2019-03-15 NOTE — Telephone Encounter (Signed)
-----   Message from Britt Bottom, MD sent at 03/15/2019  8:40 AM EST ----- Please let the patient know that the lab work is fine.

## 2019-03-15 NOTE — Telephone Encounter (Signed)
Called, LVM for pt relaying results per Dr. Felecia Shelling note. Provided office number if she has further questions/concerns.

## 2019-03-29 ENCOUNTER — Other Ambulatory Visit: Payer: Self-pay

## 2019-03-29 ENCOUNTER — Encounter: Payer: Self-pay | Admitting: Neurology

## 2019-03-29 ENCOUNTER — Ambulatory Visit (INDEPENDENT_AMBULATORY_CARE_PROVIDER_SITE_OTHER): Payer: Medicare Other | Admitting: Neurology

## 2019-03-29 VITALS — BP 102/54 | HR 65 | Temp 97.4°F | Ht 62.0 in | Wt 129.5 lb

## 2019-03-29 DIAGNOSIS — Z79899 Other long term (current) drug therapy: Secondary | ICD-10-CM | POA: Diagnosis not present

## 2019-03-29 DIAGNOSIS — Z8669 Personal history of other diseases of the nervous system and sense organs: Secondary | ICD-10-CM

## 2019-03-29 DIAGNOSIS — M0579 Rheumatoid arthritis with rheumatoid factor of multiple sites without organ or systems involvement: Secondary | ICD-10-CM

## 2019-03-29 NOTE — Progress Notes (Signed)
[facility]  [name] DOB: [birth year]  REFERRING DOCTOR OR PCP:  Dr. Estanislado Pandy SOURCE: patient, notes from Dr. Estanislado Pandy, labs  _________________________________   HISTORICAL  CHIEF COMPLAINT:  Chief Complaint  Patient presents with  . Follow-up    RM 12, alone. Last seen 03/27/2018  . Rheumatoid Arthritis    Recieves Orencia infusions. Last infusion: 03/14/2019. Next one: 04/11/2019. Receives here at [facility] with intrafusion.    HISTORY OF PRESENT ILLNESS:   Kristy Perez is a 69 y.o. woman on Orencia infusions for her rheumatoid arthritis.  Update 03/29/2019: She notes that she is mostly stable on Orencia for RA.  Sometimes she notes more pain the last week of each 4 week course.  She continues to have neck pain and stiffness.  She had lumbar surgery and her right leg is doing better with less LB and right hip pain. Now her left leg is hurting.    She has had ESI's in the past.    She has a Chiari malformation.   No headaches or syncope but coughing fits cause severe headaches.   She does note hoarseness of her voice is worse.   She was told by ENT vocal cords looked good and no definite source.  I reviewed her past MRI.  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    If she turns head with arms extended she triggers an episode with weakness in legs.      Update 03/27/2018: Sh feels her RA is doing well.  She follows up with Dr. Estanislado Pandy regularly.  She continues on Orencia.  She tolerates it well and has not had any significant flareups.   Recent lab work has been doing well.  She is going to have an L2-L3 operation for lumbar spinal degenerative (Dr. Ronnald Ramp) soon  She also has a Chiari malformatin.   No headaches or syncope but coughing fits cause severe headaches.     Update 03/24/2017: She was diagnosed with rheumatoid factor positive rheumatoid arthritis in 2002. She notes pain in her hands and feet.  Pain seems worse towards the end of  each monthly Orencia cycle.   Pain is also in the left shoulder.     She has been on Orencia IV x 4-5 years.   She tolerates it well.     Lab work has done well.    She was on Humira, Enbrel and MTX in the past.     Humira had worked well but she had gingival disease so stopped.   She gets lab work every other cycle.     She has a small Chiari malformation noted on imaging studies. It extends about 5 mm.   She gets brain or cervical spine studies periodically to follow this as she is at risk due to her rheumatoid arthritis and potential pannus development. Adjacent brain appears normal.  From 06/22/2016: She had had problems with joint pain, especially in her feet times many years. Around 2002, she was diagnosed with rheumatoid arthritis after a rheumatoid factor returned positive. He was to start methotrexate and Plaquenil but was diagnosed with breast cancer around that time so those medicines were discontinued while she was undergoing treatment with breast cancer. She had a complete response to her treatment (surgery, chemotherapy and radiation) remains cancer free.   After her chemotherapy, she went back on plaque on until plus methotrexate injections. About 12 years ago, she switched to Humira and was on that for about 8 years.  She had many dental issues including abscesses and it was discontinued. Enbrel was tried but it was less effective for her RA. About 4 years ago she started Orencia infusions. She has tolerated them well and has not had any problems with infections. Typically, the day of the infusion she will fill tired or achy. The next day she may also feel tired. The rest of the month she does well..    She is on Orencia 500 mg IV every 4 weeks.    She had her last infusion 06/15/2016.     She has allergies to Iodine injection (xray/CT) and fish.  She does ok with Betadine.    Besides breast cancer, she has had had several Basal cell and squamous cell cancers.   She had Mohs surgery on her face.       She also has had a Chiari Malformation and was seeing Dr. Carloyn Manner of Neurosurgery.   Because she also has risk of pannus development from her rheumatoid arthritis, and the MRI on an annual or near annual basis. The last MRI of her brain was performed January 2018. I personally reviewed the images. It shows a borderline Chiari malformation (looked mild on prior MRI). The adjacent brain and spinal cord have normal signal.  REVIEW OF SYSTEMS: Constitutional: No fevers, chills, sweats, or change in appetite.    She gets fatigue the last week of each Orencia cycle.     Eyes: No visual changes, double vision, eye pain Ear, nose and throat: No hearing loss, ear pain, nasal congestion, sore throat Cardiovascular: No chest pain, palpitations Respiratory: No shortness of breath at rest or with exertion.   No wheezes GastrointestinaI: No nausea, vomiting, diarrhea, abdominal pain, fecal incontinence.   She has GERD Genitourinary: No dysuria, urinary retention.   She notes urinary frequency and nocturia. Musculoskeletal: as above Integumentary: No rash, pruritus, skin lesions Neurological: as above.   She notes decreased focus, improved with Adderall Psychiatric: No depression at this time.  No anxiety Endocrine: No palpitations, diaphoresis, change in appetite, change in weigh or increased thirst Hematologic/Lymphatic: No anemia, purpura, petechiae. Allergic/Immunologic: No itchy/runny eyes, nasal congestion, recent allergic reactions, rashes  ALLERGIES: Allergies  Allergen Reactions  . Fish Allergy Anaphylaxis  . Iodine Anaphylaxis    " PER PT IV CONTRAST"  . Erythromycin     UNSPECIFIED REACTION   . Sulfonamide Derivatives Other (See Comments)    Tongue turns black.    HOME MEDICATIONS:  Current Outpatient Medications:  .  Abatacept (ORENCIA IV), Inject 500 mg into the vein every 30 (thirty) days. Infuse 2 vials / 500 mg over 30 minutes every 4 weeks, Disp: , Rfl:  .  acetaminophen  (TYLENOL) 325 MG tablet, Take 650 mg by mouth daily as needed (One hour prior to infusion). , Disp: , Rfl:  .  albuterol (PROAIR HFA) 108 (90 Base) MCG/ACT inhaler, Inhale 2 puffs into the lungs every 6 (six) hours as needed for wheezing or shortness of breath., Disp: , Rfl:  .  amoxicillin (AMOXIL) 500 MG capsule, Take 2,000 mg by mouth See admin instructions. Take 2000 mg by mouth 1 hour prior to dental appointment, Disp: , Rfl: 2 .  amphetamine-dextroamphetamine (ADDERALL) 5 MG tablet, Take 10 mg by mouth daily as needed (stress). , Disp: , Rfl: 0 .  budesonide-formoterol (SYMBICORT) 80-4.5 MCG/ACT inhaler, Take 2 puffs first thing in am and then another 2 puffs about 12 hours later., Disp: 1 Inhaler, Rfl: 11 .  chlorpheniramine (CHLOR-TRIMETON)  4 MG tablet, Take 4 mg by mouth daily as needed for allergies., Disp: , Rfl:  .  diphenhydrAMINE (BENADRYL) 25 mg capsule, Take 25 mg by mouth daily as needed (One hour prior to infusion). , Disp: , Rfl:  .  famotidine (PEPCID) 20 MG tablet, One after  bfast and supper, Disp: 60 tablet, Rfl: 11 .  ibuprofen (ADVIL,MOTRIN) 200 MG tablet, Take 400 mg by mouth every 6 (six) hours as needed (for inflammation)., Disp: , Rfl:  .  Liniments (SALONPAS EX), Apply topically as needed., Disp: , Rfl:  .  Magnesium 250 MG TABS, Take 500-1,000 tablets by mouth daily. , Disp: , Rfl:  .  NONFORMULARY OR COMPOUNDED ITEM, Estradiol 0.02% vaginal cream.  Place 1/2 gram vaginally two to three times weekly at bedtime. Disp:  3 month supply (Patient taking differently: Place 0.5 Applicatorfuls vaginally once a week. Estradiol 0.02% vaginal cream.  Place 1/2 gram vaginally two to three times weekly at bedtime. Disp:  3 month supply), Disp: 1 each, Rfl: 3 .  Omega-3 Fatty Acids (FISH OIL) 1000 MG CAPS, Take 1,000 mg by mouth daily., Disp: , Rfl:  .  promethazine (PHENERGAN) 25 MG tablet, Take 25 mg by mouth every 6 (six) hours as needed for nausea or vomiting., Disp: , Rfl:  .   RESTASIS 0.05 % ophthalmic emulsion, Place 1 drop into both eyes every morning. , Disp: , Rfl:  .  Sodium Fluoride 1.1 % PSTE, Place 1 application onto teeth 2 (two) times daily. , Disp: , Rfl:  .  temazepam (RESTORIL) 30 MG capsule, Take 30 mg by mouth at bedtime. , Disp: , Rfl:   PAST MEDICAL HISTORY: Past Medical History:  Diagnosis Date  . Anemia   . Asthma    inhaler one x per day  . Cancer [facility])    breast, left  . Chiari malformation   . Difficult intubation    cannot hyperextend neck due to Chiari malformation with tonsillar involvement, RA  . Dysrhythmia 2015   history of PAC's  . GERD (gastroesophageal reflux disease)   . PONV (postoperative nausea and vomiting)   . Rheumatoid arthritis ([place])    on Humira    PAST SURGICAL HISTORY: Past Surgical History:  Procedure Laterality Date  . BREAST SURGERY Left 2003   Lumpectomy T1C1, NO and negative ER/ PR  . COLONOSCOPY  10/04  . COLONOSCOPY  11/10   normal recheck in 5 years  . COLPORRHAPHY    . ESOPHAGOGASTRODUODENOSCOPY ENDOSCOPY  11/10   GERD  . FOOT SURGERY Left age 51   removal of pseudo rheumatoid nodules from left forefoot.  Marland Kitchen LAPAROSCOPIC BILATERAL SALPINGO OOPHERECTOMY N/A 07/30/2013   Procedure: LAPAROSCOPIC BILATERAL SALPINGO OOPHORECTOMY WITH WASHINGS;  Surgeon: Lyman Speller, MD;  Location: [facility];  Service: Gynecology;  Laterality: N/A;  . LUMBAR LAMINECTOMY/DECOMPRESSION MICRODISCECTOMY Right 04/06/2018   Procedure: Extraforaminal Microdiscectomy - Lumbar Two-Lumbar Three - right;  Surgeon: Eustace Rigsby, MD;  Location: [place];  Service: Neurosurgery;  Laterality: Right;  Extraforaminal Microdiscectomy - Lumbar Two-Lumbar Three - right  . NASAL SEPTOPLASTY W/ TURBINOPLASTY Bilateral 05/24/2014   Procedure: NASAL SEPTOPLASTY WITH  BILATERAL TURBINATE REDUCTION;  Surgeon: Jerrell Belfast, MD;  Location: [place];  Service: ENT;  Laterality: Bilateral;  . PELVIC FLOOR REPAIR  3/04   SPARC with AP colporrhaphy   . PILONIDAL CYST EXCISION    . TOTAL VAGINAL HYSTERECTOMY  1989   with AP repair    FAMILY HISTORY: Family History  Problem Relation Age of Onset  . Diabetes Mother   . Heart failure Mother   . Bladder Cancer Mother   . Stroke Mother   . Cancer Father        head and neck  . Uterine cancer Maternal Aunt   . Bladder Cancer Maternal Grandmother   . Colon cancer Maternal Grandmother     SOCIAL HISTORY:  Social History   Socioeconomic History  . Marital status: Divorced    Spouse name: Not on file  . Number of children: 2  . Years of education: Not on file  . Highest education level: Not on file  Occupational History  . Not on file  Tobacco Use  . Smoking status: Former Smoker    Packs/day: 1.50    Years: 30.00    Pack years: 45.00    Types: Cigarettes    Quit date: 02/16/1999    Years since quitting: 20.1  . Smokeless tobacco: Never Used  Substance and Sexual Activity  . Alcohol use: No  . Drug use: No  . Sexual activity: Not Currently    Partners: Male  Other Topics Concern  . Not on file  Social History Narrative   Lives alone in a 2 story home. Has 2 children.  Works as a Radiation protection practitioner.  Education: college.    Social Determinants of Health   Financial Resource Strain:   . Difficulty of Paying Living Expenses: Not on file  Food Insecurity:   . Worried About Charity fundraiser in the Last Year: Not on file  . Ran Out of Food in the Last Year: Not on file  Transportation Needs:   . Lack of Transportation (Medical): Not on file  . Lack of Transportation (Non-Medical): Not on file  Physical Activity:   . Days of Exercise per Week: Not on file  . Minutes of Exercise per Session: Not on file  Stress:   . Feeling of Stress : Not on file  Social Connections:   . Frequency of Communication with Friends and Family: Not on file  . Frequency of Social Gatherings with Friends and Family: Not on file  . Attends Religious Services: Not on file  . Active Member of  Clubs or Organizations: Not on file  . Attends Archivist Meetings: Not on file  . Marital Status: Not on file  Intimate Partner Violence:   . Fear of Current or Ex-Partner: Not on file  . Emotionally Abused: Not on file  . Physically Abused: Not on file  . Sexually Abused: Not on file     PHYSICAL EXAM  Vitals:   03/29/19 0941  BP: (!) 102/54  Pulse: 65  Temp: (!) 97.4 F (36.3 C)  Weight: 129 lb 8 oz (58.7 kg)  Height: 5\' 2"  (1.575 m)    Body mass index is 23.69 kg/m.   General: The patient is well-developed and well-nourished and in no acute distress   Neck:  The neck is nontender.   Skin: Extremities are without rash or edema.  Musculoskeletal:  Back is nontender.   Joints in her feet and hands are tender.   She has ulnar deviation of the fingers bilaterally.  Neurologic Exam  Mental status: The patient is alert and oriented x 3 at the time of the examination. The patient has apparent normal recent and remote memory, with an apparently normal attention span and concentration ability.   Speech is normal.  Cranial nerves: Extraocular movements are full. Facial strength and sensation  is normal.  Trapezius and sternocleidomastoid strength is normal.  . No obvious hearing deficits are noted.  Motor:  Muscle bulk is normal.  Muscle tone is normal.  Strength is 5/5.   Coordination: Cerebellar testing reveals good finger-nose-finger bilaterally.  Gait and station: Station is normal.  The gait is slightly arthritic.  The tandem gait is mildly wide.  Romberg is negative. Reflexes: Deep tendon reflexes are symmetric and normal bilaterally.        DIAGNOSTIC DATA (LABS, IMAGING, TESTING) - I reviewed patient records, labs, notes, testing and imaging myself where available.  Lab Results  Component Value Date   WBC 8.4 03/14/2019   HGB 13.5 03/14/2019   HCT 40.0 03/14/2019   MCV 85 03/14/2019   PLT 367 03/14/2019      Component Value Date/Time   NA 140  03/14/2019 1102   K 4.2 03/14/2019 1102   CL 102 03/14/2019 1102   CO2 22 03/14/2019 1102   GLUCOSE 101 (H) 03/14/2019 1102   GLUCOSE 91 03/30/2018 1051   BUN 13 03/14/2019 1102   CREATININE 0.59 03/14/2019 1102   CALCIUM 9.1 03/14/2019 1102   PROT 6.7 03/14/2019 1102   ALBUMIN 4.5 03/14/2019 1102   AST 26 03/14/2019 1102   ALT 17 03/14/2019 1102   ALKPHOS 83 03/14/2019 1102   BILITOT 0.3 03/14/2019 1102   GFRNONAA 95 03/14/2019 1102   GFRAA 110 03/14/2019 1102   Lab Results  Component Value Date   CHOL 228 (H) 07/07/2017   HDL 68 07/07/2017   LDLCALC 142 (H) 07/07/2017   TRIG 91 07/07/2017   CHOLHDL 3.4 07/07/2017   Lab Results  Component Value Date   HGBA1C 5.2 03/08/2016   No results found for: VITAMINB12 Lab Results  Component Value Date   TSH 1.20 03/08/2016       ASSESSMENT AND PLAN  Rheumatoid arthritis with rheumatoid factor of multiple sites without organ or systems involvement ([place]) - Plan: CBC with Differential/Platelet, Comprehensive metabolic panel  High risk medication use - Plan: CBC with Differential/Platelet, Comprehensive metabolic panel  History of Chiari malformation    1.    Continue Orencia 500 mg every 4 weeks.  Labs will be checked every 8 weeks. 2.    The Chiari malformation appears to be stable.  It is fairly mild and there is no pegging of the cerebellar tonsils against  the brainstem.  She does not have significant symptoms from it. 3.     return in one year.   She will call sooner if she has any new or worsening neurologic symptoms.      Zikeria Keough A. Felecia Shelling, MD, PhD [id number], [id number] PM Certified in Neurology, Clinical Neurophysiology, Sleep Medicine, Pain Medicine and Neuroimaging  [facility] [address], [place], [place] [address] [phone]

## 2019-04-11 DIAGNOSIS — M0519 Rheumatoid lung disease with rheumatoid arthritis of multiple sites: Secondary | ICD-10-CM | POA: Diagnosis not present

## 2019-04-18 NOTE — Progress Notes (Signed)
Office Visit Note  [name]             Date of Birth: [birth year]           MRN: [id number]             PCP: Kelton Pillar, MD Referring: Kelton Pillar, MD Visit Date: 04/26/2019 Occupation: @GUAROCC @  Subjective:  Pain in both feet   History of Present Illness: Kristy Perez is a 69 y.o. female with history of seropositive rheumatoid arthritis, DDD, and osteoporosis.  Patient is on IV Orencia infusions every 28 days.  She has not missed any infusions recently.  She denies any recent rheumatoid arthritis flares.  She continues to have chronic pain in both hands and both feet.  She has been walking 4 miles every morning and has had increased discomfort in both ankles and both feet.  She has noticed hammertoes developing as well as loss of fat pad on the plantar aspect of both feet.  She wears comfortable shoes when walking.  She continues have chronic lower back pain and left-sided radiculopathy.  She used to follow-up with Dr. Ernestina Patches for epidural injections but has not had an injection recently.  She does not want to proceed with another surgery at this time.  She has to stand most of the day due to the discomfort in her lower back. She declined wanting to start on osteoporosis medications at this time.  She has ongoing dental work.   Activities of Daily Living:  Patient reports morning stiffness for 0 minutes.   Patient Reports nocturnal pain.  Difficulty dressing/grooming: Denies Difficulty climbing stairs: Reports Difficulty getting out of chair: Reports Difficulty using hands for taps, buttons, cutlery, and/or writing: Reports  Review of Systems  Constitutional: Positive for fatigue.  HENT: Positive for mouth dryness and nose dryness. Negative for mouth sores.   Eyes: Positive for dryness. Negative for pain and visual disturbance.  Respiratory: Positive for shortness of breath. Negative for cough, hemoptysis, wheezing and difficulty breathing.        Followed by  Dr. Melvyn Novas.   Cardiovascular: Negative for chest pain, palpitations, hypertension and swelling in legs/feet.  Gastrointestinal: Negative for blood in stool, constipation and diarrhea.  Endocrine: Negative for increased urination.  Genitourinary: Negative for difficulty urinating and painful urination.  Musculoskeletal: Positive for arthralgias, joint pain and joint swelling. Negative for myalgias, muscle weakness, morning stiffness, muscle tenderness and myalgias.  Skin: Negative for color change, pallor, rash, hair loss, nodules/bumps, skin tightness, ulcers and sensitivity to sunlight.  Allergic/Immunologic: Negative for susceptible to infections.  Neurological: Negative for dizziness, headaches and weakness.  Hematological: Negative for swollen glands.  Psychiatric/Behavioral: Positive for sleep disturbance. Negative for depressed mood and confusion. The patient is not nervous/anxious.     PMFS History:  Patient Active Problem List   Diagnosis Date Noted  . Hoarseness with classic voice fatigue 10/10/2018  . S/P lumbar laminectomy 04/06/2018  . Radiculopathy due to lumbar intervertebral disc disorder 12/07/2017  . Congenital spondylolysis 12/07/2017  . Spondylolisthesis of lumbar region 12/07/2017  . DDD (degenerative disc disease), lumbar 12/08/2016  . High risk medication use 03/15/2016  . DJD (degenerative joint disease), cervical 03/15/2016  . History of asthma 03/15/2016  . History of squamous cell carcinoma 03/15/2016  . History of basal cell carcinoma 03/15/2016  . History of breast cancer 03/15/2016  . History of Chiari malformation 03/15/2016  . Vitamin D deficiency 03/15/2016  . Rheumatoid arthritis with rheumatoid factor of multiple  sites without organ or systems involvement ([place]) 10/20/2015  . Esophageal reflux 05/01/2015  . Anxiety 05/01/2015  . Sjogrens syndrome ([place]) 05/01/2015  . Chronic obstructive airway disease with asthma ([place]) 05/01/2015  . Deviated nasal  septum 05/24/2014    Class: Chronic  . Insomnia 11/25/2007  . DOE (dyspnea on exertion) 11/15/2007    Past Medical History:  Diagnosis Date  . Anemia   . Asthma    inhaler one x per day  . Cancer [facility])    breast, left  . Chiari malformation   . Difficult intubation    cannot hyperextend neck due to Chiari malformation with tonsillar involvement, RA  . Dysrhythmia 2015   history of PAC's  . GERD (gastroesophageal reflux disease)   . PONV (postoperative nausea and vomiting)   . Rheumatoid arthritis (H[facility])    on Humira    Family History  Problem Relation Age of Onset  . Diabetes Mother   . Heart failure Mother   . Bladder Cancer Mother   . Stroke Mother   . Cancer Father        head and neck  . Uterine cancer Maternal Aunt   . Bladder Cancer Maternal Grandmother   . Colon cancer Maternal Grandmother    Past Surgical History:  Procedure Laterality Date  . BREAST SURGERY Left 2003   Lumpectomy T1C1, NO and negative ER/ PR  . COLONOSCOPY  10/04  . COLONOSCOPY  11/10   normal recheck in 5 years  . COLPORRHAPHY    . ESOPHAGOGASTRODUODENOSCOPY ENDOSCOPY  11/10   GERD  . FOOT SURGERY Left age 36   removal of pseudo rheumatoid nodules from left forefoot.  Marland Kitchen LAPAROSCOPIC BILATERAL SALPINGO OOPHERECTOMY N/A 07/30/2013   Procedure: LAPAROSCOPIC BILATERAL SALPINGO OOPHORECTOMY WITH WASHINGS;  Surgeon: Lyman Speller, MD;  Location: [facility];  Service: Gynecology;  Laterality: N/A;  . LUMBAR LAMINECTOMY/DECOMPRESSION MICRODISCECTOMY Right 04/06/2018   Procedure: Extraforaminal Microdiscectomy - Lumbar Two-Lumbar Three - right;  Surgeon: Eustace Harten, MD;  Location: [place];  Service: Neurosurgery;  Laterality: Right;  Extraforaminal Microdiscectomy - Lumbar Two-Lumbar Three - right  . NASAL SEPTOPLASTY W/ TURBINOPLASTY Bilateral 05/24/2014   Procedure: NASAL SEPTOPLASTY WITH  BILATERAL TURBINATE REDUCTION;  Surgeon: Jerrell Belfast, MD;  Location: [place];  Service: ENT;  Laterality:  Bilateral;  . PELVIC FLOOR REPAIR  3/04   SPARC with AP colporrhaphy  . PILONIDAL CYST EXCISION    . TOTAL VAGINAL HYSTERECTOMY  1989   with AP repair   Social History   Social History Narrative   Lives alone in a 2 story home. Has 2 children.  Works as a Radiation protection practitioner.  Education: college.    Immunization History  Administered Date(s) Administered  . Fluad Quad(high Dose 65+) 10/14/2018  . Influenza, High Dose Seasonal PF 10/26/2017  . Influenza,inj,Quad PF,6+ Mos 10/18/2016  . Influenza-Unspecified 10/08/2014, 10/24/2015  . Pneumococcal-Unspecified 10/18/2007  . Tdap 11/24/2008  . Zoster Recombinat (Shingrix) 12/25/2016     Objective: Vital Signs: BP 118/63 (BP Location: Left Arm, Patient Position: Sitting, Cuff Size: Normal)   Pulse 83   Resp 14   Ht 5\' 2"  (1.575 m)   Wt 130 lb 6.4 oz (59.1 kg)   LMP 02/15/1985 (Approximate)   BMI 23.85 kg/m    Physical Exam Vitals and nursing note reviewed.  Constitutional:      Appearance: She is well-developed.  HENT:     Head: Normocephalic and atraumatic.  Eyes:     Conjunctiva/sclera: Conjunctivae normal.  Pulmonary:     Effort: Pulmonary effort is normal.  Abdominal:     General: Bowel sounds are normal.     Palpations: Abdomen is soft.  Musculoskeletal:     Cervical back: Normal range of motion.  Lymphadenopathy:     Cervical: No cervical adenopathy.  Skin:    General: Skin is warm and dry.     Capillary Refill: Capillary refill takes less than 2 seconds.  Neurological:     Mental Status: She is alert and oriented to person, place, and time.  Psychiatric:        Behavior: Behavior normal.      Musculoskeletal Exam: C-spine good ROM.  Thoracic and lumbar spine good ROM.  No midline spinal tenderness.  Shoulder joints and elbow joints have good ROM with no discomfort.  Limited ROM of both wrist joints.  Ulnar deviation of all MCP joints.  No tenderness or synovitis of MCP or PIP joints.  PIP and DIP thickening  consistent with osteoarthritis of both hands.  Hip joints, knee joints, and ankle joints have good ROM with no warmth or effusion.  Loss of fat pad on the plantar aspect of both feet.  Hammertoes of 2nd and 3rd toes bilaterally.  PIP and DIP thickening consistent with osteoarthritis of both feet.  CDAI Exam: CDAI Score: 1  Patient Global: 5 mm; Provider Global: 5 mm Swollen: 0 ; Tender: 0  Joint Exam 04/26/2019   No joint exam has been documented for this visit   There is currently no information documented on the homunculus. Go to the Rheumatology activity and complete the homunculus joint exam.  Investigation: No additional findings.  Imaging: No results found.  Recent Labs: Lab Results  Component Value Date   WBC 8.4 03/14/2019   HGB 13.5 03/14/2019   PLT 367 03/14/2019   NA 140 03/14/2019   K 4.2 03/14/2019   CL 102 03/14/2019   CO2 22 03/14/2019   GLUCOSE 101 (H) 03/14/2019   BUN 13 03/14/2019   CREATININE 0.59 03/14/2019   BILITOT 0.3 03/14/2019   ALKPHOS 83 03/14/2019   AST 26 03/14/2019   ALT 17 03/14/2019   PROT 6.7 03/14/2019   ALBUMIN 4.5 03/14/2019   CALCIUM 9.1 03/14/2019   GFRAA 110 03/14/2019   QFTBGOLD Negative 06/15/2016   QFTBGOLDPLUS NEGATIVE 01/23/2019    Speciality Comments: Orencia IV 500mg  every 4 weeks, patient infuses at [facility] Tb gold negative 06/15/16  Procedures:  No procedures performed Allergies: Fish allergy, Iodine, Erythromycin, and Sulfonamide derivatives   Assessment / Plan:     Visit Diagnoses: Rheumatoid arthritis with rheumatoid factor of multiple sites without organ or systems involvement ([place]) - +RF, +CCP: She has no synovitis on exam.  She is not had any recent rheumatoid arthritis flares.  She is clinically doing well on Orencia IV infusions every 28 days.  She has not missed any infusions recently.  She has ulnar deviation of MCP joints on exam but no tenderness or synovitis was noted.  She has noticed some decreased grip  strength bilaterally and continues to have joint stiffness.  She has been experiencing increased pain in both feet.  She has been walking 4 miles every morning which exacerbates her discomfort.  Hammertoes and loss of fat pad were noticed on exam.  We discussed the importance of wearing proper fitting shoes.  We also discussed using toe spacers as well as metatarsal pads and wearing socks with a thick cushion.  She has no synovitis on exam.  Ankle joints have good range of motion with no tenderness or synovitis.  She will continue on Orencia IV infusions every 28 days.  She was advised to notify us if she develops increased joint pain or joint swelling.  She will follow-up in the office in 5 months.  High risk medication use - Orencia IV infusion 500 mg every 28 days at [facility].  (inadequate response to Enbrel and Humira) CBC and CMP were drawn on 03/14/2019.  TB gold was negative on 01/23/2019.  DDD (degenerative disc disease), cervical: She has good ROM with no discomfort.  No symptoms of radiculopathy.    DDD (degenerative disc disease), lumbar - S/p lumbar laminectomy on 04/06/18 by Dr. Ronnald Ramp.  Chronic pain.  She has left sided radiculopathy.  She does not want to proceed with surgery at this time. She has had epidural injections performed by Dr. Ernestina Patches in the past.   Age-related osteoporosis without current pathological fracture - Last DEXA ordered by PCP in November 2018 showed T score of -2.6 and BMD of 0.558 at left hip and -3% change compared to 2016. She declined treatment for osteoporosis at this time.  Vitamin D deficiency  Trochanteric bursitis of right hip -  Resolved.   Primary insomnia -She takes Restoril 30 mg po at bedtime for insomnia.   Other medical conditions are listed as follows:   History of Chiari malformation  Gastroesophageal reflux disease without esophagitis  History of asthma  History of basal cell carcinoma  History of breast cancer  History of squamous cell  carcinoma  Orders: No orders of the defined types were placed in this encounter.  No orders of the defined types were placed in this encounter.     Follow-Up Instructions: Return in about 5 months (around 09/26/2019) for Rheumatoid arthritis, Osteoporosis.   Ofilia Neas, PA-C  Note - This record has been created using Dragon software.  Chart creation errors have been sought, but may not always  have been located. Such creation errors do not reflect on  the standard of medical care.

## 2019-04-26 ENCOUNTER — Ambulatory Visit (INDEPENDENT_AMBULATORY_CARE_PROVIDER_SITE_OTHER): Payer: Medicare Other | Admitting: Physician Assistant

## 2019-04-26 ENCOUNTER — Encounter: Payer: Self-pay | Admitting: Physician Assistant

## 2019-04-26 ENCOUNTER — Other Ambulatory Visit: Payer: Self-pay

## 2019-04-26 VITALS — BP 118/63 | HR 83 | Resp 14 | Ht 62.0 in | Wt 130.4 lb

## 2019-04-26 DIAGNOSIS — F5101 Primary insomnia: Secondary | ICD-10-CM | POA: Diagnosis not present

## 2019-04-26 DIAGNOSIS — Z8669 Personal history of other diseases of the nervous system and sense organs: Secondary | ICD-10-CM | POA: Diagnosis not present

## 2019-04-26 DIAGNOSIS — E559 Vitamin D deficiency, unspecified: Secondary | ICD-10-CM

## 2019-04-26 DIAGNOSIS — M0579 Rheumatoid arthritis with rheumatoid factor of multiple sites without organ or systems involvement: Secondary | ICD-10-CM

## 2019-04-26 DIAGNOSIS — Z8709 Personal history of other diseases of the respiratory system: Secondary | ICD-10-CM | POA: Diagnosis not present

## 2019-04-26 DIAGNOSIS — K219 Gastro-esophageal reflux disease without esophagitis: Secondary | ICD-10-CM | POA: Diagnosis not present

## 2019-04-26 DIAGNOSIS — Z79899 Other long term (current) drug therapy: Secondary | ICD-10-CM

## 2019-04-26 DIAGNOSIS — Z8589 Personal history of malignant neoplasm of other organs and systems: Secondary | ICD-10-CM

## 2019-04-26 DIAGNOSIS — M810 Age-related osteoporosis without current pathological fracture: Secondary | ICD-10-CM | POA: Diagnosis not present

## 2019-04-26 DIAGNOSIS — Z85828 Personal history of other malignant neoplasm of skin: Secondary | ICD-10-CM | POA: Diagnosis not present

## 2019-04-26 DIAGNOSIS — M7061 Trochanteric bursitis, right hip: Secondary | ICD-10-CM | POA: Diagnosis not present

## 2019-04-26 DIAGNOSIS — M5136 Other intervertebral disc degeneration, lumbar region: Secondary | ICD-10-CM

## 2019-04-26 DIAGNOSIS — M5030 Other cervical disc degeneration, unspecified cervical region: Secondary | ICD-10-CM | POA: Diagnosis not present

## 2019-04-26 DIAGNOSIS — Z853 Personal history of malignant neoplasm of breast: Secondary | ICD-10-CM

## 2019-05-01 DIAGNOSIS — Z23 Encounter for immunization: Secondary | ICD-10-CM | POA: Diagnosis not present

## 2019-05-09 DIAGNOSIS — M0519 Rheumatoid lung disease with rheumatoid arthritis of multiple sites: Secondary | ICD-10-CM | POA: Diagnosis not present

## 2019-05-09 DIAGNOSIS — M0579 Rheumatoid arthritis with rheumatoid factor of multiple sites without organ or systems involvement: Secondary | ICD-10-CM | POA: Diagnosis not present

## 2019-05-09 DIAGNOSIS — Z79899 Other long term (current) drug therapy: Secondary | ICD-10-CM | POA: Diagnosis not present

## 2019-05-09 NOTE — Addendum Note (Signed)
Addended by: Inis Sizer D on: 05/09/2019 10:09 AM   Modules accepted: Orders

## 2019-05-10 ENCOUNTER — Telehealth: Payer: Self-pay | Admitting: *Deleted

## 2019-05-10 LAB — CBC WITH DIFFERENTIAL/PLATELET
Basophils Absolute: 0 10*3/uL (ref 0.0–0.2)
Basos: 0 %
EOS (ABSOLUTE): 0.2 10*3/uL (ref 0.0–0.4)
Eos: 3 %
Hematocrit: 37.4 % (ref 34.0–46.6)
Hemoglobin: 12.6 g/dL (ref 11.1–15.9)
Immature Grans (Abs): 0 10*3/uL (ref 0.0–0.1)
Immature Granulocytes: 0 %
Lymphocytes Absolute: 1.6 10*3/uL (ref 0.7–3.1)
Lymphs: 25 %
MCH: 28.8 pg (ref 26.6–33.0)
MCHC: 33.7 g/dL (ref 31.5–35.7)
MCV: 85 fL (ref 79–97)
Monocytes Absolute: 0.5 10*3/uL (ref 0.1–0.9)
Monocytes: 8 %
Neutrophils Absolute: 4.2 10*3/uL (ref 1.4–7.0)
Neutrophils: 64 %
Platelets: 367 10*3/uL (ref 150–450)
RBC: 4.38 x10E6/uL (ref 3.77–5.28)
RDW: 13.1 % (ref 11.7–15.4)
WBC: 6.7 10*3/uL (ref 3.4–10.8)

## 2019-05-10 LAB — COMPREHENSIVE METABOLIC PANEL
ALT: 15 IU/L (ref 0–32)
AST: 14 IU/L (ref 0–40)
Albumin/Globulin Ratio: 2.2 (ref 1.2–2.2)
Albumin: 4 g/dL (ref 3.8–4.8)
Alkaline Phosphatase: 73 IU/L (ref 39–117)
BUN/Creatinine Ratio: 25 (ref 12–28)
BUN: 14 mg/dL (ref 8–27)
Bilirubin Total: 0.5 mg/dL (ref 0.0–1.2)
CO2: 21 mmol/L (ref 20–29)
Calcium: 9.1 mg/dL (ref 8.7–10.3)
Chloride: 106 mmol/L (ref 96–106)
Creatinine, Ser: 0.55 mg/dL — ABNORMAL LOW (ref 0.57–1.00)
GFR calc Af Amer: 112 mL/min/{1.73_m2} (ref >59)
GFR calc non Af Amer: 97 mL/min/{1.73_m2} (ref >59)
Globulin, Total: 1.8 g/dL (ref 1.5–4.5)
Glucose: 98 mg/dL (ref 65–99)
Potassium: 4.1 mmol/L (ref 3.5–5.2)
Sodium: 141 mmol/L (ref 134–144)
Total Protein: 5.8 g/dL — ABNORMAL LOW (ref 6.0–8.5)

## 2019-05-10 NOTE — Telephone Encounter (Signed)
Called pt, relayed results per Dr. Felecia Shelling note. Faxed results to her at [phone]. Received fax confirmation.

## 2019-05-10 NOTE — Telephone Encounter (Signed)
-----   Message from Britt Bottom, MD sent at 05/10/2019 11:39 AM EDT ----- Please let the patient know that the lab work is fine.,e

## 2019-05-23 ENCOUNTER — Telehealth: Payer: Self-pay | Admitting: Physical Medicine and Rehabilitation

## 2019-05-23 DIAGNOSIS — M5416 Radiculopathy, lumbar region: Secondary | ICD-10-CM

## 2019-05-23 NOTE — Telephone Encounter (Signed)
Patient is requesting an appointment for a back injection. She reports that she is having back pain and new left leg pain. Please advise.

## 2019-05-23 NOTE — Telephone Encounter (Signed)
If new left leg pain, she might want MRI or follow up with Dr. Ronnald Ramp?

## 2019-05-24 NOTE — Telephone Encounter (Signed)
MRI ordered

## 2019-06-01 ENCOUNTER — Telehealth: Payer: Self-pay | Admitting: Rheumatology

## 2019-06-01 NOTE — Telephone Encounter (Signed)
Thank you for informing me.

## 2019-06-01 NOTE — Telephone Encounter (Signed)
FYI: I spoke with patient about coming in to discuss Bone Density results, and possible treatment. Patient states she has spoke with doctor several times over the phone about results. She is not interested in any of the treatments at this time. Patient will keep her August appointment.

## 2019-06-06 DIAGNOSIS — M0519 Rheumatoid lung disease with rheumatoid arthritis of multiple sites: Secondary | ICD-10-CM | POA: Diagnosis not present

## 2019-06-20 ENCOUNTER — Other Ambulatory Visit: Payer: Self-pay

## 2019-06-20 ENCOUNTER — Ambulatory Visit
Admission: RE | Admit: 2019-06-20 | Discharge: 2019-06-20 | Disposition: A | Discharge status: Home / Self Care | Payer: Medicare Other | Source: Ambulatory Visit | Attending: Physical Medicine and Rehabilitation | Admitting: Physical Medicine and Rehabilitation

## 2019-06-20 DIAGNOSIS — M48061 Spinal stenosis, lumbar region without neurogenic claudication: Secondary | ICD-10-CM | POA: Diagnosis not present

## 2019-06-20 DIAGNOSIS — M5416 Radiculopathy, lumbar region: Secondary | ICD-10-CM

## 2019-06-27 ENCOUNTER — Other Ambulatory Visit: Payer: Self-pay

## 2019-06-27 ENCOUNTER — Ambulatory Visit (INDEPENDENT_AMBULATORY_CARE_PROVIDER_SITE_OTHER): Payer: Medicare Other | Admitting: Physical Medicine and Rehabilitation

## 2019-06-27 ENCOUNTER — Encounter: Payer: Self-pay | Admitting: Physical Medicine and Rehabilitation

## 2019-06-27 VITALS — BP 100/62 | HR 78 | Ht 62.0 in | Wt 130.0 lb

## 2019-06-27 DIAGNOSIS — Q762 Congenital spondylolisthesis: Secondary | ICD-10-CM

## 2019-06-27 DIAGNOSIS — M4316 Spondylolisthesis, lumbar region: Secondary | ICD-10-CM | POA: Diagnosis not present

## 2019-06-27 DIAGNOSIS — M5416 Radiculopathy, lumbar region: Secondary | ICD-10-CM | POA: Diagnosis not present

## 2019-06-27 DIAGNOSIS — M961 Postlaminectomy syndrome, not elsewhere classified: Secondary | ICD-10-CM

## 2019-06-27 NOTE — Progress Notes (Signed)
Pt states pain in the middle of the lower back that radiates into the left buttocks, left leg all the way down. Pt states pain started in the past 6 months. Pt states laying down makes the leg pain worse and standing and walking makes the lower back pain worse. Laying down helps with lower back pain and moving around helps with left leg pain.   .Numeric Pain Rating Scale and Functional Assessment Average Pain 6 Pain Right Now 4 My pain is constant, dull and aching Pain is worse with: walking, standing and laying down Pain improves with: therapy/exercise   In the last MONTH (on 0-10 scale) has pain interfered with the following?  1. General activity like being  able to carry out your everyday physical activities such as walking, climbing stairs, carrying groceries, or moving a chair?  Rating(7)  2. Relation with others like being able to carry out your usual social activities and roles such as  activities at home, at work and in your community. Rating(7)  3. Enjoyment of life such that you have  been bothered by emotional problems such as feeling anxious, depressed or irritable?  Rating(5)

## 2019-06-28 ENCOUNTER — Ambulatory Visit (INDEPENDENT_AMBULATORY_CARE_PROVIDER_SITE_OTHER): Payer: Medicare Other | Admitting: Physical Medicine and Rehabilitation

## 2019-06-28 ENCOUNTER — Ambulatory Visit: Payer: Self-pay

## 2019-06-28 ENCOUNTER — Encounter: Payer: Self-pay | Admitting: Physical Medicine and Rehabilitation

## 2019-06-28 DIAGNOSIS — M5416 Radiculopathy, lumbar region: Secondary | ICD-10-CM | POA: Diagnosis not present

## 2019-06-28 DIAGNOSIS — M4316 Spondylolisthesis, lumbar region: Secondary | ICD-10-CM | POA: Diagnosis not present

## 2019-06-28 MED ORDER — METHYLPREDNISOLONE ACETATE 80 MG/ML IJ SUSP
40.0000 mg | Freq: Once | INTRAMUSCULAR | Status: AC
  Administered 2019-06-28: 40 mg

## 2019-06-28 NOTE — Procedures (Signed)
Lumbosacral Transforaminal Epidural Steroid Injection - Sub-Pedicular Approach with Fluoroscopic Guidance  [name]      Date of Birth: September 26, 1950 MRN: [id number] PCP: Kelton Pillar, MD      Visit Date: 06/28/2019   Universal Protocol:    Date/Time: 06/28/2019  Consent Given By: the patient  Position: PRONE  Additional Comments: Vital signs were monitored before and after the procedure. Patient was prepped and draped in the usual sterile fashion. The correct patient, procedure, and site was verified.   Injection Procedure Details:  Procedure Site One Meds Administered:  Meds ordered this encounter  Medications  . methylPREDNISolone acetate (DEPO-MEDROL) injection 40 mg    Laterality: Bilateral  Location/Site:  L5-S1  Needle size: 22 G  Needle type: Spinal  Needle Placement: Transforaminal  Findings:    -Comments: Excellent flow of contrast along the nerve and into the epidural space.  Procedure Details: After squaring off the end-plates to get a true AP view, the C-arm was positioned so that an oblique view of the foramen as noted above was visualized. The target area is just inferior to the "nose of the scotty dog" or sub pedicular. The soft tissues overlying this structure were infiltrated with 2-3 ml. of 1% Lidocaine without Epinephrine.  The spinal needle was inserted toward the target using a "trajectory" view along the fluoroscope beam.  Under AP and lateral visualization, the needle was advanced so it did not puncture dura and was located close the 6 O'Clock position of the pedical in AP tracterory. Biplanar projections were used to confirm position. Aspiration was confirmed to be negative for CSF and/or blood. A 1-2 ml. volume of Isovue-250 was injected and flow of contrast was noted at each level. Radiographs were obtained for documentation purposes.   After attaining the desired flow of contrast documented above, a 0.5 to 1.0 ml test dose of  0.25% Marcaine was injected into each respective transforaminal space.  The patient was observed for 90 seconds post injection.  After no sensory deficits were reported, and normal lower extremity motor function was noted,   the above injectate was administered so that equal amounts of the injectate were placed at each foramen (level) into the transforaminal epidural space.   Additional Comments:  The patient tolerated the procedure well Dressing: 2 x 2 sterile gauze and Band-Aid    Post-procedure details: Patient was observed during the procedure. Post-procedure instructions were reviewed.  Patient left the clinic in stable condition.

## 2019-06-28 NOTE — Progress Notes (Signed)
Pt states pain in the lower back. Pt states no major changes since yesterday.   .Numeric Pain Rating Scale and Functional Assessment Average Pain 5   In the last MONTH (on 0-10 scale) has pain interfered with the following?  1. General activity like being  able to carry out your everyday physical activities such as walking, climbing stairs, carrying groceries, or moving a chair?  Rating(6)   +Driver, -BT, -Dye Allergies.

## 2019-06-28 NOTE — Progress Notes (Signed)
Kristy Perez - 69 y.o. female MRN [id number]  Date of birth: Aug 29, 1950  Office Visit Note: Visit Date: 06/27/2019 PCP: Kelton Pillar, MD Referred by: Kelton Pillar, MD  Subjective: Chief Complaint  Patient presents with  . Lower Back - Pain  . Left Leg - Pain   HPI: Kristy Perez is a 69 y.o. female who comes in today For evaluation and management of chronic low back pain significant exacerbation into the left buttocks and left leg.  Since I have seen her last she has had updated MRI of the lumbar spine and this is reviewed with her today with spine models and imaging and reviewed below.  She reports 6 months of worsening exacerbation with severe low back lumbosacral pain in the buttocks and left leg more of an L5 somewhat S1 distribution.  No symptoms in the right leg.  She actually reports worsening with laying down and prolonged standing.  She does pace today in the room and says that walking does seem to help to some degree if she moves around.  Rates her pain as a 6 out of 10 constant dull and aching.  No focal weakness not necessarily any paresthesia.  She has had no trauma.  Her brief history is that she has had prior laminectomy at L2-3 by Dr. Sherley Bounds on the right.  She is done well after that surgery and we were seeing her during that time as well.  New MRI does reveal laminectomy at L2-3 without residual disc or nerve compression.  She continues to have grade 1 listhesis with bilateral pars defects at L5-S1 with by foraminal narrowing and pretty severe arthritis at this level.  She has moderate arthritis at L4-5 without central canal narrowing.  During the ongoing exacerbation she has had activity modification home exercise as well as Tylenol and ibuprofen.  She does not really like to take medications.  She has had physical therapy in the past and continues with those exercises.  Her case is complicated by rheumatoid arthritis as well as anxiety  Review of Systems    Musculoskeletal: Positive for back pain and joint pain.       Left leg pain  All other systems reviewed and are negative.  Otherwise per HPI.  Assessment & Plan: Visit Diagnoses:  1. Spondylolisthesis of lumbar region   2. Lumbar radiculopathy   3. Congenital spondylolysis   4. Post laminectomy syndrome     Plan: Findings:  Chronic history of back pain status post lumbar discectomy in the upper lumbar region history of bilateral pars defects with injections over time that were beneficial and now with 6 months of exacerbated symptoms into the left buttock and leg and across the lumbosacral junction.  I think her pain is multifactorial but mainly the issue is the pars defect at L5-S1 with foraminal narrowing.  She will continue with current medication plan and stretching and we talked about the need for hamstring stretching in general with pars defects.  We are going to get her back in soon for left versus bilateral L5 transforaminal epidural steroid injection diagnostically and hopefully therapeutically.  She likely has some myofascial pain here as well as she does have tenderness on exam into the muscular part of it.  Particularly over the gluteus medius region.  She ultimately might be a good candidate for spinal cord stimulator trial although this was not talked with her today.    Meds & Orders: No orders of the defined types were placed  in this encounter.  No orders of the defined types were placed in this encounter.   Follow-up: Return for Left versus bilateral L5 transforaminal injection.   Procedures: No procedures performed  No notes on file   Clinical History: CLINICAL DATA:  Chronic low back pain with left hip and left buttock pain for 6 months.  EXAM: MRI LUMBAR SPINE WITHOUT CONTRAST  TECHNIQUE: Multiplanar, multisequence MR imaging of the lumbar spine was performed. No intravenous contrast was administered.  COMPARISON:  MRI 01/04/2018  FINDINGS: Segmentation:  There are five lumbar type vertebral bodies. The last full intervertebral disc space is labeled L5-S1. This corresponds to the prior study.  Alignment: Stable pars defects at L5 with a grade 1 spondylolisthesis. The other lumbar vertebral bodies are normally aligned.  Vertebrae: Stable endplate reactive changes at L2-3 and L5-S1. No bone lesions or fractures.  Conus medullaris and cauda equina: Conus extends to the L1 level. Conus and cauda equina appear normal.  Paraspinal and other soft tissues: No significant paraspinal or retroperitoneal findings.  Disc levels:  T12-L1: No significant findings.  L1-2: No significant findings.  L2-3: Advanced degenerative disc disease and moderate facet disease. Evidence of prior right-sided extraforaminal microdiscectomy. No findings for recurrent foraminal disc protrusion. Mild osteophytic ridging. Mild right lateral recess stenosis.  L3-4: Mild to moderate facet disease but no disc protrusions, spinal or foraminal stenosis.  L4-5: Moderate facet disease but no disc protrusions, spinal or foraminal stenosis.  L5-S1: Bilateral pars defects and stable anterolisthesis of L5. Bulging uncovered disc and severe facet disease but no significant spinal stenosis. Stable bilateral foraminal stenosis.  IMPRESSION: 1. Postoperative changes at L2-3 with a right-sided extraforaminal microdiscectomy. No findings for recurrent disc protrusion. Mild right lateral recess stenosis and mild osteophytic ridging. 2. Stable bilateral pars defects at L5 with a grade 1 spondylolisthesis and associated severe facet disease and bilateral foraminal stenosis.   Electronically Signed   By: Marijo Sanes M.D.   On: 06/21/2019 09:37   She reports that she quit smoking about 20 years ago. Her smoking use included cigarettes. She has a 45.00 pack-year smoking history. She has never used smokeless tobacco. No results for input(s): HGBA1C, LABURIC  in the last 8760 hours.  Objective:  VS:  HT:5\' 2"  (157.5 cm)   WT:130 lb (59 kg)  BMI:23.77    BP:100/62  HR:78bpm  TEMP: ( )  RESP:  Physical Exam Vitals and nursing note reviewed.  Constitutional:      General: She is not in acute distress.    Appearance: Normal appearance. She is well-developed. She is not ill-appearing.  HENT:     Head: Normocephalic and atraumatic.  Eyes:     Conjunctiva/sclera: Conjunctivae normal.     Pupils: Pupils are equal, round, and reactive to light.  Cardiovascular:     Rate and Rhythm: Normal rate.     Pulses: Normal pulses.  Pulmonary:     Effort: Pulmonary effort is normal.  Musculoskeletal:     Right lower leg: No edema.     Left lower leg: No edema.     Comments: Patient has some difficulty going sit to stand with pain with full extension of the lumbar spine and facet loading.  She has no pain really over the greater trochanters although is mildly tender.  She does have a equivocally positive slump test upon seated on arising.  She has good distal strength.  Skin:    General: Skin is warm and dry.  Findings: No erythema or rash.  Neurological:     General: No focal deficit present.     Mental Status: She is alert and oriented to person, place, and time.     Sensory: No sensory deficit.     Motor: No abnormal muscle tone.     Coordination: Coordination normal.     Gait: Gait normal.  Psychiatric:        Mood and Affect: Mood normal.        Behavior: Behavior normal.     Ortho Exam  Imaging: No results found.  Past Medical/Family/Surgical/Social History: Medications & Allergies reviewed per EMR, new medications updated. Patient Active Problem List   Diagnosis Date Noted  . Hoarseness with classic voice fatigue 10/10/2018  . S/P lumbar laminectomy 04/06/2018  . Radiculopathy due to lumbar intervertebral disc disorder 12/07/2017  . Congenital spondylolysis 12/07/2017  . Spondylolisthesis of lumbar region 12/07/2017  . DDD  (degenerative disc disease), lumbar 12/08/2016  . High risk medication use 03/15/2016  . DJD (degenerative joint disease), cervical 03/15/2016  . History of asthma 03/15/2016  . History of squamous cell carcinoma 03/15/2016  . History of basal cell carcinoma 03/15/2016  . History of breast cancer 03/15/2016  . History of Chiari malformation 03/15/2016  . Vitamin D deficiency 03/15/2016  . Rheumatoid arthritis with rheumatoid factor of multiple sites without organ or systems involvement ([place]) 10/20/2015  . Esophageal reflux 05/01/2015  . Anxiety 05/01/2015  . Sjogrens syndrome ([place]) 05/01/2015  . Chronic obstructive airway disease with asthma ([place]) 05/01/2015  . Deviated nasal septum 05/24/2014    Class: Chronic  . Insomnia 11/25/2007  . DOE (dyspnea on exertion) 11/15/2007   Past Medical History:  Diagnosis Date  . Anemia   . Asthma    inhaler one x per day  . Cancer [facility])    breast, left  . Chiari malformation   . Difficult intubation    cannot hyperextend neck due to Chiari malformation with tonsillar involvement, RA  . Dysrhythmia 2015   history of PAC's  . GERD (gastroesophageal reflux disease)   . PONV (postoperative nausea and vomiting)   . Rheumatoid arthritis ([facility])    on Humira   Family History  Problem Relation Age of Onset  . Diabetes Mother   . Heart failure Mother   . Bladder Cancer Mother   . Stroke Mother   . Cancer Father        head and neck  . Uterine cancer Maternal Aunt   . Bladder Cancer Maternal Grandmother   . Colon cancer Maternal Grandmother    Past Surgical History:  Procedure Laterality Date  . BREAST SURGERY Left 2003   Lumpectomy T1C1, NO and negative ER/ PR  . COLONOSCOPY  10/04  . COLONOSCOPY  11/10   normal recheck in 5 years  . COLPORRHAPHY    . ESOPHAGOGASTRODUODENOSCOPY ENDOSCOPY  11/10   GERD  . FOOT SURGERY Left age 47   removal of pseudo rheumatoid nodules from left forefoot.  Marland Kitchen LAPAROSCOPIC BILATERAL SALPINGO  OOPHERECTOMY N/A 07/30/2013   Procedure: LAPAROSCOPIC BILATERAL SALPINGO OOPHORECTOMY WITH WASHINGS;  Surgeon: Lyman Speller, MD;  Location: [facility];  Service: Gynecology;  Laterality: N/A;  . LUMBAR LAMINECTOMY/DECOMPRESSION MICRODISCECTOMY Right 04/06/2018   Procedure: Extraforaminal Microdiscectomy - Lumbar Two-Lumbar Three - right;  Surgeon: Eustace Routon, MD;  Location: [facility];  Service: Neurosurgery;  Laterality: Right;  Extraforaminal Microdiscectomy - Lumbar Two-Lumbar Three - right  . NASAL SEPTOPLASTY W/ TURBINOPLASTY Bilateral 05/24/2014  Procedure: NASAL SEPTOPLASTY WITH  BILATERAL TURBINATE REDUCTION;  Surgeon: Jerrell Belfast, MD;  Location: [facility];  Service: ENT;  Laterality: Bilateral;  . PELVIC FLOOR REPAIR  3/04   SPARC with AP colporrhaphy  . PILONIDAL CYST EXCISION    . TOTAL VAGINAL HYSTERECTOMY  1989   with AP repair   Social History   Occupational History  . Not on file  Tobacco Use  . Smoking status: Former Smoker    Packs/day: 1.50    Years: 30.00    Pack years: 45.00    Types: Cigarettes    Quit date: 02/16/1999    Years since quitting: 20.3  . Smokeless tobacco: Never Used  Substance and Sexual Activity  . Alcohol use: No  . Drug use: No  . Sexual activity: Not Currently    Partners: Male

## 2019-06-28 NOTE — Progress Notes (Addendum)
Kristy Perez - 69 y.o. female MRN [id number]  Date of birth: Oct 31, 1950  Office Visit Note: Visit Date: 06/28/2019 PCP: Kelton Pillar, MD Referred by: Kelton Pillar, MD  Subjective: Chief Complaint  Patient presents with  . Lower Back - Pain   HPI:  Kristy Perez is a 69 y.o. female who comes in today for planned Bilateral L5-S1 lumbar epidural steroid injection with fluoroscopic guidance.  The patient has failed conservative care including home exercise, medications, time and activity modification.  This injection will be diagnostic and hopefully therapeutic.  Please see requesting physician notes for further details and justification.  ** I am unable t addend the procedure report the way it gets, signed but we did not use iodide based contrast. I used 32ml of Omniscan total.  ROS Otherwise per HPI.  Assessment & Plan: Visit Diagnoses:  1. Lumbar radiculopathy   2. Spondylolisthesis of lumbar region     Plan: No additional findings.   Meds & Orders:  Meds ordered this encounter  Medications  . methylPREDNISolone acetate (DEPO-MEDROL) injection 40 mg    Orders Placed This Encounter  Procedures  . XR C-ARM NO REPORT  . Epidural Steroid injection    Follow-up: Return if symptoms worsen or fail to improve.   Procedures: No procedures performed  Lumbosacral Transforaminal Epidural Steroid Injection - Sub-Pedicular Approach with Fluoroscopic Guidance  [name]      Date of Birth: 1950-03-01 MRN: [id number] PCP: Kelton Pillar, MD      Visit Date: 06/28/2019   Universal Protocol:    Date/Time: 06/28/2019  Consent Given By: the patient  Position: PRONE  Additional Comments: Vital signs were monitored before and after the procedure. Patient was prepped and draped in the usual sterile fashion. The correct patient, procedure, and site was verified.   Injection Procedure Details:  Procedure Site One Meds Administered:  Meds ordered this encounter   Medications  . methylPREDNISolone acetate (DEPO-MEDROL) injection 40 mg    Laterality: Bilateral  Location/Site:  L5-S1  Needle size: 22 G  Needle type: Spinal  Needle Placement: Transforaminal  Findings:    -Comments: Excellent flow of contrast along the nerve and into the epidural space.  Procedure Details: After squaring off the end-plates to get a true AP view, the C-arm was positioned so that an oblique view of the foramen as noted above was visualized. The target area is just inferior to the "nose of the scotty dog" or sub pedicular. The soft tissues overlying this structure were infiltrated with 2-3 ml. of 1% Lidocaine without Epinephrine.  The spinal needle was inserted toward the target using a "trajectory" view along the fluoroscope beam.  Under AP and lateral visualization, the needle was advanced so it did not puncture dura and was located close the 6 O'Clock position of the pedical in AP tracterory. Biplanar projections were used to confirm position. Aspiration was confirmed to be negative for CSF and/or blood. A 1-2 ml. volume of Isovue-250 was injected and flow of contrast was noted at each level. Radiographs were obtained for documentation purposes.   After attaining the desired flow of contrast documented above, a 0.5 to 1.0 ml test dose of 0.25% Marcaine was injected into each respective transforaminal space.  The patient was observed for 90 seconds post injection.  After no sensory deficits were reported, and normal lower extremity motor function was noted,   the above injectate was administered so that equal amounts of the injectate were placed at each  foramen (level) into the transforaminal epidural space.   Additional Comments:  The patient tolerated the procedure well Dressing: 2 x 2 sterile gauze and Band-Aid    Post-procedure details: Patient was observed during the procedure. Post-procedure instructions were reviewed.  Patient left the clinic in stable  condition.      Clinical History: CLINICAL DATA:  Chronic low back pain with left hip and left buttock pain for 6 months.  EXAM: MRI LUMBAR SPINE WITHOUT CONTRAST  TECHNIQUE: Multiplanar, multisequence MR imaging of the lumbar spine was performed. No intravenous contrast was administered.  COMPARISON:  MRI 01/04/2018  FINDINGS: Segmentation: There are five lumbar type vertebral bodies. The last full intervertebral disc space is labeled L5-S1. This corresponds to the prior study.  Alignment: Stable pars defects at L5 with a grade 1 spondylolisthesis. The other lumbar vertebral bodies are normally aligned.  Vertebrae: Stable endplate reactive changes at L2-3 and L5-S1. No bone lesions or fractures.  Conus medullaris and cauda equina: Conus extends to the L1 level. Conus and cauda equina appear normal.  Paraspinal and other soft tissues: No significant paraspinal or retroperitoneal findings.  Disc levels:  T12-L1: No significant findings.  L1-2: No significant findings.  L2-3: Advanced degenerative disc disease and moderate facet disease. Evidence of prior right-sided extraforaminal microdiscectomy. No findings for recurrent foraminal disc protrusion. Mild osteophytic ridging. Mild right lateral recess stenosis.  L3-4: Mild to moderate facet disease but no disc protrusions, spinal or foraminal stenosis.  L4-5: Moderate facet disease but no disc protrusions, spinal or foraminal stenosis.  L5-S1: Bilateral pars defects and stable anterolisthesis of L5. Bulging uncovered disc and severe facet disease but no significant spinal stenosis. Stable bilateral foraminal stenosis.  IMPRESSION: 1. Postoperative changes at L2-3 with a right-sided extraforaminal microdiscectomy. No findings for recurrent disc protrusion. Mild right lateral recess stenosis and mild osteophytic ridging. 2. Stable bilateral pars defects at L5 with a grade 1 spondylolisthesis  and associated severe facet disease and bilateral foraminal stenosis.   Electronically Signed   By: Marijo Sanes M.D.   On: 06/21/2019 09:37     Objective:  VS:  HT:    WT:   BMI:     BP:   HR: bpm  TEMP: ( )  RESP:  Physical Exam Constitutional:      General: She is not in acute distress.    Appearance: Normal appearance. She is not ill-appearing.  HENT:     Head: Normocephalic and atraumatic.     Right Ear: External ear normal.     Left Ear: External ear normal.  Eyes:     Extraocular Movements: Extraocular movements intact.  Cardiovascular:     Rate and Rhythm: Normal rate.     Pulses: Normal pulses.  Musculoskeletal:     Right lower leg: No edema.     Left lower leg: No edema.     Comments: Patient has good distal strength with no pain over the greater trochanters.  No clonus or focal weakness.  Skin:    Findings: No erythema, lesion or rash.  Neurological:     General: No focal deficit present.     Mental Status: She is alert and oriented to person, place, and time.     Sensory: No sensory deficit.     Motor: No weakness or abnormal muscle tone.     Coordination: Coordination normal.  Psychiatric:        Mood and Affect: Mood normal.        Behavior: Behavior  normal.      Imaging: Epidural Steroid injection  Result Date: 06/28/2019 Magnus Sinning, MD     06/28/2019  3:07 PM Lumbosacral Transforaminal Epidural Steroid Injection - Sub-Pedicular Approach with Fluoroscopic Guidance [name]     Date of Birth: Sep 03, 1950 MRN: [id number] PCP: Kelton Pillar, MD     Visit Date: 06/28/2019  Universal Protocol:   Date/Time: 06/28/2019 Consent Given By: the patient Position: PRONE Additional Comments: Vital signs were monitored before and after the procedure. Patient was prepped and draped in the usual sterile fashion. The correct patient, procedure, and site was verified. Injection Procedure Details: Procedure Site One Meds Administered: Meds ordered  this encounter Medications . methylPREDNISolone acetate (DEPO-MEDROL) injection 40 mg Laterality: Bilateral Location/Site: L5-S1 Needle size: 22 G Needle type: Spinal Needle Placement: Transforaminal Findings:   -Comments: Excellent flow of contrast along the nerve and into the epidural space. Procedure Details: After squaring off the end-plates to get a true AP view, the C-arm was positioned so that an oblique view of the foramen as noted above was visualized. The target area is just inferior to the "nose of the scotty dog" or sub pedicular. The soft tissues overlying this structure were infiltrated with 2-3 ml. of 1% Lidocaine without Epinephrine. The spinal needle was inserted toward the target using a "trajectory" view along the fluoroscope beam.  Under AP and lateral visualization, the needle was advanced so it did not puncture dura and was located close the 6 O'Clock position of the pedical in AP tracterory. Biplanar projections were used to confirm position. Aspiration was confirmed to be negative for CSF and/or blood. A 1-2 ml. volume of Isovue-250 was injected and flow of contrast was noted at each level. Radiographs were obtained for documentation purposes. After attaining the desired flow of contrast documented above, a 0.5 to 1.0 ml test dose of 0.25% Marcaine was injected into each respective transforaminal space.  The patient was observed for 90 seconds post injection.  After no sensory deficits were reported, and normal lower extremity motor function was noted,   the above injectate was administered so that equal amounts of the injectate were placed at each foramen (level) into the transforaminal epidural space. Additional Comments: The patient tolerated the procedure well Dressing: 2 x 2 sterile gauze and Band-Aid  Post-procedure details: Patient was observed during the procedure. Post-procedure instructions were reviewed. Patient left the clinic in stable condition.

## 2019-07-04 DIAGNOSIS — M0519 Rheumatoid lung disease with rheumatoid arthritis of multiple sites: Secondary | ICD-10-CM | POA: Diagnosis not present

## 2019-07-04 DIAGNOSIS — M0579 Rheumatoid arthritis with rheumatoid factor of multiple sites without organ or systems involvement: Secondary | ICD-10-CM | POA: Diagnosis not present

## 2019-07-04 DIAGNOSIS — Z79899 Other long term (current) drug therapy: Secondary | ICD-10-CM | POA: Diagnosis not present

## 2019-07-04 NOTE — Addendum Note (Signed)
Addended by: Inis Sizer D on: 07/04/2019 09:45 AM   Modules accepted: Orders

## 2019-07-05 ENCOUNTER — Telehealth: Payer: Self-pay | Admitting: *Deleted

## 2019-07-05 LAB — COMPREHENSIVE METABOLIC PANEL
ALT: 15 IU/L (ref 0–32)
AST: 16 IU/L (ref 0–40)
Albumin/Globulin Ratio: 2.4 — ABNORMAL HIGH (ref 1.2–2.2)
Albumin: 4.5 g/dL (ref 3.8–4.8)
Alkaline Phosphatase: 78 IU/L (ref 48–121)
BUN/Creatinine Ratio: 24 (ref 12–28)
BUN: 13 mg/dL (ref 8–27)
Bilirubin Total: 0.4 mg/dL (ref 0.0–1.2)
CO2: 24 mmol/L (ref 20–29)
Calcium: 9.1 mg/dL (ref 8.7–10.3)
Chloride: 101 mmol/L (ref 96–106)
Creatinine, Ser: 0.55 mg/dL — ABNORMAL LOW (ref 0.57–1.00)
GFR calc Af Amer: 112 mL/min/{1.73_m2} (ref >59)
GFR calc non Af Amer: 97 mL/min/{1.73_m2} (ref >59)
Globulin, Total: 1.9 g/dL (ref 1.5–4.5)
Glucose: 87 mg/dL (ref 65–99)
Potassium: 4 mmol/L (ref 3.5–5.2)
Sodium: 138 mmol/L (ref 134–144)
Total Protein: 6.4 g/dL (ref 6.0–8.5)

## 2019-07-05 LAB — CBC WITH DIFFERENTIAL/PLATELET
Basophils Absolute: 0 10*3/uL (ref 0.0–0.2)
Basos: 1 %
EOS (ABSOLUTE): 0.2 10*3/uL (ref 0.0–0.4)
Eos: 2 %
Hematocrit: 36.2 % (ref 34.0–46.6)
Hemoglobin: 12 g/dL (ref 11.1–15.9)
Immature Grans (Abs): 0 10*3/uL (ref 0.0–0.1)
Immature Granulocytes: 0 %
Lymphocytes Absolute: 2.1 10*3/uL (ref 0.7–3.1)
Lymphs: 24 %
MCH: 28.2 pg (ref 26.6–33.0)
MCHC: 33.1 g/dL (ref 31.5–35.7)
MCV: 85 fL (ref 79–97)
Monocytes Absolute: 0.6 10*3/uL (ref 0.1–0.9)
Monocytes: 7 %
Neutrophils Absolute: 5.8 10*3/uL (ref 1.4–7.0)
Neutrophils: 66 %
Platelets: 408 10*3/uL (ref 150–450)
RBC: 4.26 x10E6/uL (ref 3.77–5.28)
RDW: 13.1 % (ref 11.7–15.4)
WBC: 8.7 10*3/uL (ref 3.4–10.8)

## 2019-07-05 NOTE — Telephone Encounter (Signed)
-----   Message from Britt Bottom, MD sent at 07/05/2019  1:39 PM EDT ----- Please let the patient know that the lab work is fine.

## 2019-07-05 NOTE — Telephone Encounter (Signed)
Called and spoke with pt about results per Dr. Felecia Shelling note. She verbalized understanding. She asked that results be faxed to [phone]. Results faxed.

## 2019-08-01 DIAGNOSIS — M0519 Rheumatoid lung disease with rheumatoid arthritis of multiple sites: Secondary | ICD-10-CM | POA: Diagnosis not present

## 2019-08-30 DIAGNOSIS — Z79899 Other long term (current) drug therapy: Secondary | ICD-10-CM | POA: Diagnosis not present

## 2019-08-30 DIAGNOSIS — M0519 Rheumatoid lung disease with rheumatoid arthritis of multiple sites: Secondary | ICD-10-CM | POA: Diagnosis not present

## 2019-08-30 DIAGNOSIS — M0579 Rheumatoid arthritis with rheumatoid factor of multiple sites without organ or systems involvement: Secondary | ICD-10-CM | POA: Diagnosis not present

## 2019-08-30 NOTE — Addendum Note (Signed)
Addended by: Inis Sizer D on: 08/30/2019 10:00 AM   Modules accepted: Orders

## 2019-08-31 LAB — CBC WITH DIFFERENTIAL/PLATELET
Basophils Absolute: 0 10*3/uL (ref 0.0–0.2)
Basos: 0 %
EOS (ABSOLUTE): 0.2 10*3/uL (ref 0.0–0.4)
Eos: 3 %
Hematocrit: 37.5 % (ref 34.0–46.6)
Hemoglobin: 12.3 g/dL (ref 11.1–15.9)
Immature Grans (Abs): 0 10*3/uL (ref 0.0–0.1)
Immature Granulocytes: 0 %
Lymphocytes Absolute: 2.2 10*3/uL (ref 0.7–3.1)
Lymphs: 28 %
MCH: 28.5 pg (ref 26.6–33.0)
MCHC: 32.8 g/dL (ref 31.5–35.7)
MCV: 87 fL (ref 79–97)
Monocytes Absolute: 0.7 10*3/uL (ref 0.1–0.9)
Monocytes: 8 %
Neutrophils Absolute: 4.8 10*3/uL (ref 1.4–7.0)
Neutrophils: 61 %
Platelets: 381 10*3/uL (ref 150–450)
RBC: 4.32 x10E6/uL (ref 3.77–5.28)
RDW: 13.2 % (ref 11.7–15.4)
WBC: 8 10*3/uL (ref 3.4–10.8)

## 2019-08-31 LAB — COMPREHENSIVE METABOLIC PANEL
ALT: 15 IU/L (ref 0–32)
AST: 17 IU/L (ref 0–40)
Albumin/Globulin Ratio: 2 (ref 1.2–2.2)
Albumin: 4.3 g/dL (ref 3.8–4.8)
Alkaline Phosphatase: 70 IU/L (ref 48–121)
BUN/Creatinine Ratio: 23 (ref 12–28)
BUN: 11 mg/dL (ref 8–27)
Bilirubin Total: 0.4 mg/dL (ref 0.0–1.2)
CO2: 22 mmol/L (ref 20–29)
Calcium: 9.2 mg/dL (ref 8.7–10.3)
Chloride: 102 mmol/L (ref 96–106)
Creatinine, Ser: 0.47 mg/dL — ABNORMAL LOW (ref 0.57–1.00)
GFR calc Af Amer: 118 mL/min/{1.73_m2} (ref >59)
GFR calc non Af Amer: 103 mL/min/{1.73_m2} (ref >59)
Globulin, Total: 2.2 g/dL (ref 1.5–4.5)
Glucose: 97 mg/dL (ref 65–99)
Potassium: 4.4 mmol/L (ref 3.5–5.2)
Sodium: 137 mmol/L (ref 134–144)
Total Protein: 6.5 g/dL (ref 6.0–8.5)

## 2019-09-03 ENCOUNTER — Telehealth: Payer: Self-pay | Admitting: *Deleted

## 2019-09-03 NOTE — Telephone Encounter (Signed)
Called and spoke with pt about lab results. Pt verbalized understanding. Sent pt copy via fax to [phone]. Received fax confirmation.

## 2019-09-03 NOTE — Telephone Encounter (Signed)
-----   Message from Britt Bottom, MD sent at 08/31/2019 12:36 PM EDT ----- Please let the patient know that the lab work is fine.

## 2019-09-17 NOTE — Progress Notes (Signed)
Office Visit Note  [name]             Date of Birth: [birth year]           MRN: [id number]             PCP: Kelton Pillar, MD Referring: Kelton Pillar, MD Visit Date: 10/01/2019 Occupation: @GUAROCC @  Subjective:  Medication monitoring   History of Present Illness: Kristy Perez is a 69 y.o. female with history of seropositive rheumatoid arthritis, osteoarthritis, and osteoporosis.  Patient is on Orencia IV infusions every 28 days.  She is due for her infusion in 2 days.  She occasionally experiences increased joint stiffness a few days leading up to receiving her infusion but denies any increased joint pain or joint swelling.  She remains active walking several miles daily.  She is already walked 11,000 steps today.  She denies any other new concerns.  She declines treatment for osteoporosis. She has received the [employer] vaccination and is planning on receiving the booster once available.  Activities of Daily Living:  Patient reports morning stiffness for 0 minutes.   Patient Reports nocturnal pain.  Difficulty dressing/grooming: Denies Difficulty climbing stairs: Denies Difficulty getting out of chair: Reports Difficulty using hands for taps, buttons, cutlery, and/or writing: Reports  Review of Systems  Constitutional: Positive for fatigue.  HENT: Positive for mouth dryness and nose dryness. Negative for mouth sores.   Eyes: Positive for dryness.  Respiratory: Negative for shortness of breath and difficulty breathing.   Cardiovascular: Negative for chest pain and palpitations.  Gastrointestinal: Positive for constipation. Negative for blood in stool and diarrhea.  Endocrine: Negative for increased urination.  Genitourinary: Negative for difficulty urinating.  Musculoskeletal: Positive for arthralgias and joint pain. Negative for joint swelling, myalgias, morning stiffness, muscle tenderness and myalgias.  Skin: Negative for color change, rash and  redness.  Allergic/Immunologic: Negative for susceptible to infections.  Neurological: Positive for numbness. Negative for dizziness, headaches, memory loss and weakness.  Hematological: Negative for bruising/bleeding tendency.  Psychiatric/Behavioral: Negative for confusion.    PMFS History:  Patient Active Problem List   Diagnosis Date Noted  . Hoarseness with classic voice fatigue 10/10/2018  . S/P lumbar laminectomy 04/06/2018  . Radiculopathy due to lumbar intervertebral disc disorder 12/07/2017  . Congenital spondylolysis 12/07/2017  . Spondylolisthesis of lumbar region 12/07/2017  . DDD (degenerative disc disease), lumbar 12/08/2016  . High risk medication use 03/15/2016  . DJD (degenerative joint disease), cervical 03/15/2016  . History of asthma 03/15/2016  . History of squamous cell carcinoma 03/15/2016  . History of basal cell carcinoma 03/15/2016  . History of breast cancer 03/15/2016  . History of Chiari malformation 03/15/2016  . Vitamin D deficiency 03/15/2016  . Rheumatoid arthritis with rheumatoid factor of multiple sites without organ or systems involvement ([place]) 10/20/2015  . Esophageal reflux 05/01/2015  . Anxiety 05/01/2015  . Sjogrens syndrome ([place]) 05/01/2015  . Chronic obstructive airway disease with asthma ([place]) 05/01/2015  . Deviated nasal septum 05/24/2014    Class: Chronic  . Insomnia 11/25/2007  . DOE (dyspnea on exertion) 11/15/2007    Past Medical History:  Diagnosis Date  . Anemia   . Asthma    inhaler one x per day  . Cancer [facility])    breast, left  . Chiari malformation   . Difficult intubation    cannot hyperextend neck due to Chiari malformation with tonsillar involvement, RA  . Dysrhythmia 2015   history of PAC's  .  GERD (gastroesophageal reflux disease)   . PONV (postoperative nausea and vomiting)   . Rheumatoid arthritis (H[facility])    on Humira    Family History  Problem Relation Age of Onset  . Diabetes Mother   . Heart failure  Mother   . Bladder Cancer Mother   . Stroke Mother   . Cancer Father        head and neck  . Uterine cancer Maternal Aunt   . Bladder Cancer Maternal Grandmother   . Colon cancer Maternal Grandmother    Past Surgical History:  Procedure Laterality Date  . BREAST SURGERY Left 2003   Lumpectomy T1C1, NO and negative ER/ PR  . COLONOSCOPY  10/04  . COLONOSCOPY  11/10   normal recheck in 5 years  . COLPORRHAPHY    . ESOPHAGOGASTRODUODENOSCOPY ENDOSCOPY  11/10   GERD  . FOOT SURGERY Left age 47   removal of pseudo rheumatoid nodules from left forefoot.  Marland Kitchen LAPAROSCOPIC BILATERAL SALPINGO OOPHERECTOMY N/A 07/30/2013   Procedure: LAPAROSCOPIC BILATERAL SALPINGO OOPHORECTOMY WITH WASHINGS;  Surgeon: Lyman Speller, MD;  Location: [facility];  Service: Gynecology;  Laterality: N/A;  . LUMBAR LAMINECTOMY/DECOMPRESSION MICRODISCECTOMY Right 04/06/2018   Procedure: Extraforaminal Microdiscectomy - Lumbar Two-Lumbar Three - right;  Surgeon: Eustace Dispenza, MD;  Location: [facility];  Service: Neurosurgery;  Laterality: Right;  Extraforaminal Microdiscectomy - Lumbar Two-Lumbar Three - right  . NASAL SEPTOPLASTY W/ TURBINOPLASTY Bilateral 05/24/2014   Procedure: NASAL SEPTOPLASTY WITH  BILATERAL TURBINATE REDUCTION;  Surgeon: Jerrell Belfast, MD;  Location: [facility];  Service: ENT;  Laterality: Bilateral;  . PELVIC FLOOR REPAIR  3/04   SPARC with AP colporrhaphy  . PILONIDAL CYST EXCISION    . TOTAL VAGINAL HYSTERECTOMY  1989   with AP repair   Social History   Social History Narrative   Lives alone in a 2 story home. Has 2 children.  Works as a Radiation protection practitioner.  Education: college.    Immunization History  Administered Date(s) Administered  . Fluad Quad(high Dose 65+) 10/14/2018  . Influenza, High Dose Seasonal PF 10/26/2017  . Influenza,inj,Quad PF,6+ Mos 10/18/2016  . Influenza-Unspecified 10/08/2014, 10/24/2015  . Janssen (J&J) SARS-COV-2 Vaccination 05/01/2019  . Pneumococcal-Unspecified  10/18/2007  . Tdap 11/24/2008  . Zoster Recombinat (Shingrix) 12/25/2016     Objective: Vital Signs: BP (!) 102/57 (BP Location: Left Arm, Patient Position: Sitting, Cuff Size: Normal)   Pulse 88   Resp 15   Ht 5\' 2"  (1.575 m)   Wt 127 lb (57.6 kg)   LMP 02/15/1985 (Approximate)   BMI 23.23 kg/m    Physical Exam Vitals and nursing note reviewed.  Constitutional:      Appearance: She is well-developed.  HENT:     Head: Normocephalic and atraumatic.  Eyes:     Conjunctiva/sclera: Conjunctivae normal.  Pulmonary:     Effort: Pulmonary effort is normal.  Abdominal:     General: Bowel sounds are normal.     Palpations: Abdomen is soft.  Musculoskeletal:     Cervical back: Normal range of motion.  Lymphadenopathy:     Cervical: No cervical adenopathy.  Skin:    General: Skin is warm and dry.     Capillary Refill: Capillary refill takes less than 2 seconds.  Neurological:     Mental Status: She is alert and oriented to person, place, and time.  Psychiatric:        Behavior: Behavior normal.      Musculoskeletal Exam: C-spine, thoracic spine, lumbar  spine have good range of motion.  Shoulder joints, elbow joints, wrist joints, MCPs, PIPs, and DIPs have good range of motion with no synovitis.  She has complete fist formation bilaterally.  Ulnar deviation of the right MCP joints noted.  She has PIP and DIP thickening consistent with osteoarthritis of both hands.  Hip joints have good range of motion with no discomfort.  Knee joints have good range of motion with no warmth or effusion.  Ankle joints have good range of motion with no tenderness or inflammation.  No tenderness of MTP joints.  She has PIP and DIP thickening consistent with osteoarthritis of both feet.  Overcrowding of toes noted.  CDAI Exam: CDAI Score: 0.8  Patient Global: 4 mm; Provider Global: 4 mm Swollen: 0 ; Tender: 0  Joint Exam 10/01/2019   No joint exam has been documented for this visit   There is  currently no information documented on the homunculus. Go to the Rheumatology activity and complete the homunculus joint exam.  Investigation: No additional findings.  Imaging: No results found.  Recent Labs: Lab Results  Component Value Date   WBC 8.0 08/30/2019   HGB 12.3 08/30/2019   PLT 381 08/30/2019   NA 137 08/30/2019   K 4.4 08/30/2019   CL 102 08/30/2019   CO2 22 08/30/2019   GLUCOSE 97 08/30/2019   BUN 11 08/30/2019   CREATININE 0.47 (L) 08/30/2019   BILITOT 0.4 08/30/2019   ALKPHOS 70 08/30/2019   AST 17 08/30/2019   ALT 15 08/30/2019   PROT 6.5 08/30/2019   ALBUMIN 4.3 08/30/2019   CALCIUM 9.2 08/30/2019   GFRAA 118 08/30/2019   QFTBGOLD Negative 06/15/2016   QFTBGOLDPLUS NEGATIVE 01/23/2019    Speciality Comments: Orencia IV 500mg  every 4 weeks, patient infuses at [place] Tb gold negative 06/15/16  Procedures:  No procedures performed Allergies: Fish allergy, Iodine, Erythromycin, and Sulfonamide derivatives   Assessment / Plan:     Visit Diagnoses: Rheumatoid arthritis with rheumatoid factor of multiple sites without organ or systems involvement ([place]) - +RF, +CCP: She has no tenderness or synovitis on exam.  She has not had any recent rheumatoid arthritis flares.  She is clinically doing well on Orencia 500 mg IV infusions every 28 days.  She is due for her infusion in 2 days.  She develops increased joint stiffness about 2 to 3 days prior to her infusions but denies any increased joint pain or swelling at that time.  Her rheumatoid arthritis is well controlled.  She will continue on the current treatment regimen.  She was advised to notify us if she develops increased joint pain or joint swelling.  She will follow-up in the office in 5 months.   High risk medication use - Orencia IV infusion 500 mg every 28 days at [facility].  (inadequate response to Enbrel and Humira).  CBC and CMP were drawn on 08/30/2019.  TB gold negative on 01/23/2019 and we will continue to monitor  yearly. She received the [employer] vaccination in April 2021.  She is planning on receiving the booster vaccine once available.  She was advised to notify us if she develops a COVID-19 infection due to requiring an antibody infusion.  DDD (degenerative disc disease), cervical: She has good ROM with no discomfort.  No symptoms of radiculopathy.   DDD (degenerative disc disease), lumbar - S/p lumbar laminectomy on 04/06/18 by Dr. Ronnald Ramp.  She is not having any discomfort in her lower back at this time.  Age-related osteoporosis without current pathological fracture - DEXA: Right femoral neck BMD 0.552 with T score -2.7. Prior DEXA ordered by PCP in November 2018 showed T score of -2.6 and BMD of 0.558 at left hip and -3% change compared to 2016. She declined treatment for osteoporosis.    Vitamin D deficiency: She is taking an OTC daily vitamin D supplement as recommended.   Trochanteric bursitis of right hip: Resolved.   Other medical conditions are listed as follows:   Primary insomnia  History of Chiari malformation  Gastroesophageal reflux disease without esophagitis  History of asthma  History of basal cell carcinoma  History of breast cancer  History of squamous cell carcinoma  Orders: No orders of the defined types were placed in this encounter.  No orders of the defined types were placed in this encounter.    Follow-Up Instructions: Return in about 5 months (around 03/02/2020) for Rheumatoid arthritis, Osteoarthritis, Osteoporosis.   Ofilia Neas, PA-C  Note - This record has been created using Dragon software.  Chart creation errors have been sought, but may not always  have been located. Such creation errors do not reflect on  the standard of medical care.

## 2019-09-26 ENCOUNTER — Ambulatory Visit: Payer: 59 | Admitting: Physician Assistant

## 2019-10-01 ENCOUNTER — Encounter: Payer: Self-pay | Admitting: Physician Assistant

## 2019-10-01 ENCOUNTER — Other Ambulatory Visit: Payer: Self-pay

## 2019-10-01 ENCOUNTER — Ambulatory Visit (INDEPENDENT_AMBULATORY_CARE_PROVIDER_SITE_OTHER): Payer: Medicare Other | Admitting: Physician Assistant

## 2019-10-01 VITALS — BP 102/57 | HR 88 | Resp 15 | Ht 62.0 in | Wt 127.0 lb

## 2019-10-01 DIAGNOSIS — F5101 Primary insomnia: Secondary | ICD-10-CM

## 2019-10-01 DIAGNOSIS — E559 Vitamin D deficiency, unspecified: Secondary | ICD-10-CM | POA: Diagnosis not present

## 2019-10-01 DIAGNOSIS — K219 Gastro-esophageal reflux disease without esophagitis: Secondary | ICD-10-CM | POA: Diagnosis not present

## 2019-10-01 DIAGNOSIS — M5030 Other cervical disc degeneration, unspecified cervical region: Secondary | ICD-10-CM | POA: Diagnosis not present

## 2019-10-01 DIAGNOSIS — Z8709 Personal history of other diseases of the respiratory system: Secondary | ICD-10-CM | POA: Diagnosis not present

## 2019-10-01 DIAGNOSIS — M7061 Trochanteric bursitis, right hip: Secondary | ICD-10-CM

## 2019-10-01 DIAGNOSIS — Z853 Personal history of malignant neoplasm of breast: Secondary | ICD-10-CM

## 2019-10-01 DIAGNOSIS — M0579 Rheumatoid arthritis with rheumatoid factor of multiple sites without organ or systems involvement: Secondary | ICD-10-CM

## 2019-10-01 DIAGNOSIS — Z8589 Personal history of malignant neoplasm of other organs and systems: Secondary | ICD-10-CM

## 2019-10-01 DIAGNOSIS — M810 Age-related osteoporosis without current pathological fracture: Secondary | ICD-10-CM | POA: Diagnosis not present

## 2019-10-01 DIAGNOSIS — M5136 Other intervertebral disc degeneration, lumbar region: Secondary | ICD-10-CM | POA: Diagnosis not present

## 2019-10-01 DIAGNOSIS — Z79899 Other long term (current) drug therapy: Secondary | ICD-10-CM | POA: Diagnosis not present

## 2019-10-01 DIAGNOSIS — Z85828 Personal history of other malignant neoplasm of skin: Secondary | ICD-10-CM

## 2019-10-01 DIAGNOSIS — Z8669 Personal history of other diseases of the nervous system and sense organs: Secondary | ICD-10-CM | POA: Diagnosis not present

## 2019-10-01 NOTE — Patient Instructions (Signed)
COVID-19 vaccine recommendations:   COVID-19 vaccine is recommended for everyone (unless you are allergic to a vaccine component), even if you are on a medication that suppresses your immune system.   If you are on Methotrexate, Cellcept (mycophenolate), Rinvoq, Morrie Sheldon, and Olumiant- hold the medication for 1 week after each vaccine. Hold Methotrexate for 2 weeks after the single dose COVID-19 vaccine.   If you are on Orencia subcutaneous injection - hold medication one week prior to and one week after the first COVID-19 vaccine dose (only).   If you are on Orencia IV infusions- time vaccination administration so that the first COVID-19 vaccination will occur four weeks after the infusion and postpone the subsequent infusion by one week.   If you are on Cyclophosphamide or Rituxan infusions please contact your doctor prior to receiving the COVID-19 vaccine.   Do not take Tylenol or ant anti-inflammatory medications (NSAIDs) 24 hours prior to the COVID-19 vaccination.   There is no direct evidence about the efficacy of the COVID-19 vaccine in individuals who are on medications that suppress the immune system.   Even if you are fully vaccinated, and you are on any medications that suppress your immune system, please continue to wear a mask, maintain at least six feet social distance and practice hand hygiene.   If you develop a COVID-19 infection, please contact your PCP or our office to determine if you need antibody infusion.  We anticipate that a booster vaccine will be available soon for immunosuppressed individuals. Please call our office before receiving your booster dose to make adjustments to your medication regimen.   [email or web address]

## 2019-10-03 ENCOUNTER — Other Ambulatory Visit: Payer: Self-pay | Admitting: Internal Medicine

## 2019-10-03 DIAGNOSIS — M0519 Rheumatoid lung disease with rheumatoid arthritis of multiple sites: Secondary | ICD-10-CM | POA: Diagnosis not present

## 2019-10-05 ENCOUNTER — Other Ambulatory Visit: Payer: Self-pay | Admitting: Internal Medicine

## 2019-10-16 DIAGNOSIS — Z23 Encounter for immunization: Secondary | ICD-10-CM | POA: Diagnosis not present

## 2019-10-17 DIAGNOSIS — D0439 Carcinoma in situ of skin of other parts of face: Secondary | ICD-10-CM | POA: Diagnosis not present

## 2019-10-17 DIAGNOSIS — D485 Neoplasm of uncertain behavior of skin: Secondary | ICD-10-CM | POA: Diagnosis not present

## 2019-10-30 ENCOUNTER — Encounter: Payer: Self-pay | Admitting: *Deleted

## 2019-10-30 DIAGNOSIS — M0579 Rheumatoid arthritis with rheumatoid factor of multiple sites without organ or systems involvement: Secondary | ICD-10-CM | POA: Diagnosis not present

## 2019-10-30 DIAGNOSIS — Z79899 Other long term (current) drug therapy: Secondary | ICD-10-CM | POA: Diagnosis not present

## 2019-10-30 DIAGNOSIS — M0519 Rheumatoid lung disease with rheumatoid arthritis of multiple sites: Secondary | ICD-10-CM | POA: Diagnosis not present

## 2019-10-30 NOTE — Addendum Note (Signed)
Addended by: Inis Sizer D on: 10/30/2019 11:06 AM   Modules accepted: Orders

## 2019-10-31 ENCOUNTER — Telehealth: Payer: Self-pay | Admitting: Neurology

## 2019-10-31 LAB — CBC WITH DIFFERENTIAL/PLATELET
Basophils Absolute: 0 10*3/uL (ref 0.0–0.2)
Basos: 0 %
EOS (ABSOLUTE): 0.2 10*3/uL (ref 0.0–0.4)
Eos: 3 %
Hematocrit: 39.1 % (ref 34.0–46.6)
Hemoglobin: 12.7 g/dL (ref 11.1–15.9)
Immature Grans (Abs): 0 10*3/uL (ref 0.0–0.1)
Immature Granulocytes: 0 %
Lymphocytes Absolute: 2.1 10*3/uL (ref 0.7–3.1)
Lymphs: 29 %
MCH: 28.2 pg (ref 26.6–33.0)
MCHC: 32.5 g/dL (ref 31.5–35.7)
MCV: 87 fL (ref 79–97)
Monocytes Absolute: 0.6 10*3/uL (ref 0.1–0.9)
Monocytes: 8 %
Neutrophils Absolute: 4.3 10*3/uL (ref 1.4–7.0)
Neutrophils: 60 %
Platelets: 344 10*3/uL (ref 150–450)
RBC: 4.5 x10E6/uL (ref 3.77–5.28)
RDW: 12.9 % (ref 11.7–15.4)
WBC: 7.2 10*3/uL (ref 3.4–10.8)

## 2019-10-31 LAB — COMPREHENSIVE METABOLIC PANEL
ALT: 16 IU/L (ref 0–32)
AST: 35 IU/L (ref 0–40)
Albumin/Globulin Ratio: 1.9 (ref 1.2–2.2)
Albumin: 4.6 g/dL (ref 3.8–4.8)
Alkaline Phosphatase: 74 IU/L (ref 44–121)
BUN/Creatinine Ratio: 17 (ref 12–28)
BUN: 12 mg/dL (ref 8–27)
Bilirubin Total: 0.4 mg/dL (ref 0.0–1.2)
CO2: 19 mmol/L — ABNORMAL LOW (ref 20–29)
Calcium: 9 mg/dL (ref 8.7–10.3)
Chloride: 98 mmol/L (ref 96–106)
Creatinine, Ser: 0.71 mg/dL (ref 0.57–1.00)
GFR calc Af Amer: 101 mL/min/{1.73_m2} (ref >59)
GFR calc non Af Amer: 88 mL/min/{1.73_m2} (ref >59)
Globulin, Total: 2.4 g/dL (ref 1.5–4.5)
Glucose: 69 mg/dL (ref 65–99)
Sodium: 138 mmol/L (ref 134–144)
Total Protein: 7 g/dL (ref 6.0–8.5)

## 2019-10-31 NOTE — Telephone Encounter (Signed)
-----   Message from Britt Bottom, MD sent at 10/31/2019 10:00 AM EDT ----- Please let the patient know that the lab work is fine.

## 2019-10-31 NOTE — Telephone Encounter (Signed)
Called the patient and reviewed the lab results with her. She asked if lab results could be faxed to her [phone]. Advised I would do so.

## 2019-11-07 DIAGNOSIS — L578 Other skin changes due to chronic exposure to nonionizing radiation: Secondary | ICD-10-CM | POA: Diagnosis not present

## 2019-11-07 DIAGNOSIS — D225 Melanocytic nevi of trunk: Secondary | ICD-10-CM | POA: Diagnosis not present

## 2019-11-07 DIAGNOSIS — L821 Other seborrheic keratosis: Secondary | ICD-10-CM | POA: Diagnosis not present

## 2019-11-07 DIAGNOSIS — L814 Other melanin hyperpigmentation: Secondary | ICD-10-CM | POA: Diagnosis not present

## 2019-11-07 DIAGNOSIS — L570 Actinic keratosis: Secondary | ICD-10-CM | POA: Diagnosis not present

## 2019-11-07 DIAGNOSIS — Z85828 Personal history of other malignant neoplasm of skin: Secondary | ICD-10-CM | POA: Diagnosis not present

## 2019-11-13 ENCOUNTER — Other Ambulatory Visit: Payer: Self-pay | Admitting: Internal Medicine

## 2019-11-29 DIAGNOSIS — M0519 Rheumatoid lung disease with rheumatoid arthritis of multiple sites: Secondary | ICD-10-CM | POA: Diagnosis not present

## 2019-12-12 DIAGNOSIS — Z1231 Encounter for screening mammogram for malignant neoplasm of breast: Secondary | ICD-10-CM | POA: Diagnosis not present

## 2019-12-20 DIAGNOSIS — D0439 Carcinoma in situ of skin of other parts of face: Secondary | ICD-10-CM | POA: Diagnosis not present

## 2019-12-27 ENCOUNTER — Other Ambulatory Visit (INDEPENDENT_AMBULATORY_CARE_PROVIDER_SITE_OTHER): Payer: Medicare Other

## 2019-12-27 ENCOUNTER — Other Ambulatory Visit: Payer: Self-pay

## 2019-12-27 DIAGNOSIS — Z79899 Other long term (current) drug therapy: Secondary | ICD-10-CM | POA: Diagnosis not present

## 2019-12-27 DIAGNOSIS — M0519 Rheumatoid lung disease with rheumatoid arthritis of multiple sites: Secondary | ICD-10-CM | POA: Diagnosis not present

## 2019-12-27 DIAGNOSIS — M0579 Rheumatoid arthritis with rheumatoid factor of multiple sites without organ or systems involvement: Secondary | ICD-10-CM

## 2019-12-27 DIAGNOSIS — Z0289 Encounter for other administrative examinations: Secondary | ICD-10-CM

## 2019-12-28 LAB — COMPREHENSIVE METABOLIC PANEL
ALT: 14 IU/L (ref 0–32)
AST: 19 IU/L (ref 0–40)
Albumin/Globulin Ratio: 2.1 (ref 1.2–2.2)
Albumin: 4.1 g/dL (ref 3.8–4.8)
Alkaline Phosphatase: 76 IU/L (ref 44–121)
BUN/Creatinine Ratio: 20 (ref 12–28)
BUN: 10 mg/dL (ref 8–27)
Bilirubin Total: 0.3 mg/dL (ref 0.0–1.2)
CO2: 17 mmol/L — ABNORMAL LOW (ref 20–29)
Calcium: 9 mg/dL (ref 8.7–10.3)
Chloride: 104 mmol/L (ref 96–106)
Creatinine, Ser: 0.49 mg/dL — ABNORMAL LOW (ref 0.57–1.00)
GFR calc Af Amer: 116 mL/min/{1.73_m2} (ref >59)
GFR calc non Af Amer: 100 mL/min/{1.73_m2} (ref >59)
Globulin, Total: 2 g/dL (ref 1.5–4.5)
Glucose: 132 mg/dL — ABNORMAL HIGH (ref 65–99)
Potassium: 4.7 mmol/L (ref 3.5–5.2)
Sodium: 139 mmol/L (ref 134–144)
Total Protein: 6.1 g/dL (ref 6.0–8.5)

## 2019-12-28 LAB — CBC WITH DIFFERENTIAL/PLATELET
Basophils Absolute: 0 10*3/uL (ref 0.0–0.2)
Basos: 0 %
EOS (ABSOLUTE): 0.3 10*3/uL (ref 0.0–0.4)
Eos: 5 %
Hematocrit: 36.2 % (ref 34.0–46.6)
Hemoglobin: 12.2 g/dL (ref 11.1–15.9)
Immature Grans (Abs): 0 10*3/uL (ref 0.0–0.1)
Immature Granulocytes: 0 %
Lymphocytes Absolute: 1.9 10*3/uL (ref 0.7–3.1)
Lymphs: 28 %
MCH: 27.9 pg (ref 26.6–33.0)
MCHC: 33.7 g/dL (ref 31.5–35.7)
MCV: 83 fL (ref 79–97)
Monocytes Absolute: 0.5 10*3/uL (ref 0.1–0.9)
Monocytes: 8 %
Neutrophils Absolute: 4 10*3/uL (ref 1.4–7.0)
Neutrophils: 59 %
Platelets: 364 10*3/uL (ref 150–450)
RBC: 4.37 x10E6/uL (ref 3.77–5.28)
RDW: 12.8 % (ref 11.7–15.4)
WBC: 6.8 10*3/uL (ref 3.4–10.8)

## 2019-12-31 ENCOUNTER — Telehealth: Payer: Self-pay | Admitting: *Deleted

## 2019-12-31 NOTE — Telephone Encounter (Signed)
-----   Message from Britt Bottom, MD sent at 12/28/2019  9:57 AM EST ----- Please let the patient know that the lab work is ok   GLucose was high --- was she fasting ? If so  should be sent to PCP.  If she ate before labs then level is fine

## 2019-12-31 NOTE — Telephone Encounter (Signed)
Called and spoke with pt about results per Dr. Felecia Shelling note. She verbalized understanding. Pt states she was not fasting for labs.  Her work place is going out of business. Updated phone #'s on record. Mobile belongs to company phone. She will call once she no longer has this.

## 2020-01-15 DIAGNOSIS — H10413 Chronic giant papillary conjunctivitis, bilateral: Secondary | ICD-10-CM | POA: Diagnosis not present

## 2020-01-23 DIAGNOSIS — K219 Gastro-esophageal reflux disease without esophagitis: Secondary | ICD-10-CM | POA: Diagnosis not present

## 2020-01-23 DIAGNOSIS — M069 Rheumatoid arthritis, unspecified: Secondary | ICD-10-CM | POA: Diagnosis not present

## 2020-01-23 DIAGNOSIS — Z1389 Encounter for screening for other disorder: Secondary | ICD-10-CM | POA: Diagnosis not present

## 2020-01-23 DIAGNOSIS — Z0000 Encounter for general adult medical examination without abnormal findings: Secondary | ICD-10-CM | POA: Diagnosis not present

## 2020-01-23 DIAGNOSIS — Z136 Encounter for screening for cardiovascular disorders: Secondary | ICD-10-CM | POA: Diagnosis not present

## 2020-01-23 DIAGNOSIS — Z853 Personal history of malignant neoplasm of breast: Secondary | ICD-10-CM | POA: Diagnosis not present

## 2020-01-23 DIAGNOSIS — J45909 Unspecified asthma, uncomplicated: Secondary | ICD-10-CM | POA: Diagnosis not present

## 2020-01-23 DIAGNOSIS — F988 Other specified behavioral and emotional disorders with onset usually occurring in childhood and adolescence: Secondary | ICD-10-CM | POA: Diagnosis not present

## 2020-01-23 DIAGNOSIS — K649 Unspecified hemorrhoids: Secondary | ICD-10-CM | POA: Diagnosis not present

## 2020-01-23 DIAGNOSIS — G935 Compression of brain: Secondary | ICD-10-CM | POA: Diagnosis not present

## 2020-01-23 DIAGNOSIS — N951 Menopausal and female climacteric states: Secondary | ICD-10-CM | POA: Diagnosis not present

## 2020-01-23 DIAGNOSIS — M810 Age-related osteoporosis without current pathological fracture: Secondary | ICD-10-CM | POA: Diagnosis not present

## 2020-01-23 DIAGNOSIS — R7302 Impaired glucose tolerance (oral): Secondary | ICD-10-CM | POA: Diagnosis not present

## 2020-01-24 DIAGNOSIS — M0519 Rheumatoid lung disease with rheumatoid arthritis of multiple sites: Secondary | ICD-10-CM | POA: Diagnosis not present

## 2020-02-13 NOTE — Progress Notes (Signed)
Office Visit Note  [name]             Date of Birth: [birth year]           MRN: [id number]             PCP: Kelton Pillar, MD Referring: Kelton Pillar, MD Visit Date: 02/26/2020 Occupation: @GUAROCC @  Subjective:  Pain in both feet   History of Present Illness: Kristy Perez is a 69 y.o. female with history of seropositive rheumatoid arthritis, DDD, and osteoporosis.  She is on Orencia 500 mg IV infusions every 28 days at [facility].  According to the patient she had to postpone her Orencia infusion by 1 week due to [facility] being closed but she is scheduled for next infusion tomorrow.  She denies any recent rheumatoid arthritis flares.  She has noticed increased fatigue since having to postpone her Orencia infusion.  Patient reports she continues to have chronic pain in both ankles and both feet.  She walks several miles on a daily basis for exercise.  She states the pain and stiffness in her hands is improved.  She uses arthritis compression gloves on a regular basis for symptomatic relief. She declined treatment for osteoporosis in the past. She denies any recent infections.  She received the COVID-19 booster on 02/15/2020.  She states that she underwent Mohs surgery on 12/20/2019 for a squamous cell carcinoma.     Activities of Daily Living:  Patient reports morning stiffness for 0  minutes.   Patient Reports nocturnal pain.  Difficulty dressing/grooming: Denies Difficulty climbing stairs: Denies Difficulty getting out of chair: Reports Difficulty using hands for taps, buttons, cutlery, and/or writing: Reports  Review of Systems  Constitutional: Positive for fatigue.  HENT: Positive for mouth dryness and nose dryness. Negative for mouth sores.   Eyes: Positive for dryness. Negative for pain and visual disturbance.  Respiratory: Positive for shortness of breath. Negative for cough, hemoptysis and difficulty breathing.   Cardiovascular: Negative for chest pain, palpitations,  hypertension and swelling in legs/feet.  Gastrointestinal: Positive for constipation. Negative for blood in stool and diarrhea.  Endocrine: Negative for increased urination.  Genitourinary: Negative for painful urination.  Musculoskeletal: Positive for arthralgias and joint pain. Negative for joint swelling, myalgias, muscle weakness, morning stiffness, muscle tenderness and myalgias.  Skin: Negative for color change, pallor, rash, hair loss, nodules/bumps, skin tightness, ulcers and sensitivity to sunlight.  Allergic/Immunologic: Negative for susceptible to infections.  Neurological: Positive for numbness. Negative for dizziness, headaches and weakness.  Hematological: Negative for swollen glands.  Psychiatric/Behavioral: Negative for depressed mood and sleep disturbance. The patient is not nervous/anxious.     PMFS History:  Patient Active Problem List   Diagnosis Date Noted  . Hoarseness with classic voice fatigue 10/10/2018  . S/P lumbar laminectomy 04/06/2018  . Radiculopathy due to lumbar intervertebral disc disorder 12/07/2017  . Congenital spondylolysis 12/07/2017  . Spondylolisthesis of lumbar region 12/07/2017  . DDD (degenerative disc disease), lumbar 12/08/2016  . High risk medication use 03/15/2016  . DJD (degenerative joint disease), cervical 03/15/2016  . History of asthma 03/15/2016  . History of squamous cell carcinoma 03/15/2016  . History of basal cell carcinoma 03/15/2016  . History of breast cancer 03/15/2016  . History of Chiari malformation 03/15/2016  . Vitamin D deficiency 03/15/2016  . Rheumatoid arthritis with rheumatoid factor of multiple sites without organ or systems involvement ([place]) 10/20/2015  . Esophageal reflux 05/01/2015  . Anxiety 05/01/2015  . Sjogrens syndrome ([place])  05/01/2015  . Chronic obstructive airway disease with asthma (HCC) 05/01/2015  . Deviated nasal septum 05/24/2014    Class: Chronic  . Insomnia 11/25/2007  . DOE (dyspnea on  exertion) 11/15/2007    Past Medical History:  Diagnosis Date  . Anemia   . Asthma    inhaler one x per day  . Cancer [facility])    breast, left  . Chiari malformation   . Difficult intubation    cannot hyperextend neck due to Chiari malformation with tonsillar involvement, RA  . Dysrhythmia 2015   history of PAC's  . GERD (gastroesophageal reflux disease)   . High cholesterol    dx by PCP per patient   . PONV (postoperative nausea and vomiting)   . Rheumatoid arthritis (HCC)    on Humira    Family History  Problem Relation Age of Onset  . Diabetes Mother   . Heart failure Mother   . Bladder Cancer Mother   . Stroke Mother   . Cancer Father        head and neck  . Uterine cancer Maternal Aunt   . Bladder Cancer Maternal Grandmother   . Colon cancer Maternal Grandmother    Past Surgical History:  Procedure Laterality Date  . BREAST SURGERY Left 2003   Lumpectomy T1C1, NO and negative ER/ PR  . COLONOSCOPY  10/04  . COLONOSCOPY  11/10   normal recheck in 5 years  . COLPORRHAPHY    . ESOPHAGOGASTRODUODENOSCOPY ENDOSCOPY  11/10   GERD  . FOOT SURGERY Left age 55   removal of pseudo rheumatoid nodules from left forefoot.  Marland Kitchen LAPAROSCOPIC BILATERAL SALPINGO OOPHERECTOMY N/A 07/30/2013   Procedure: LAPAROSCOPIC BILATERAL SALPINGO OOPHORECTOMY WITH WASHINGS;  Surgeon: Annamaria Boots, MD;  Location: [facility] ORS;  Service: Gynecology;  Laterality: N/A;  . LUMBAR LAMINECTOMY/DECOMPRESSION MICRODISCECTOMY Right 04/06/2018   Procedure: Extraforaminal Microdiscectomy - Lumbar Two-Lumbar Three - right;  Surgeon: Tia Alert, MD;  Location: [facility] OR;  Service: Neurosurgery;  Laterality: Right;  Extraforaminal Microdiscectomy - Lumbar Two-Lumbar Three - right  . NASAL SEPTOPLASTY W/ TURBINOPLASTY Bilateral 05/24/2014   Procedure: NASAL SEPTOPLASTY WITH  BILATERAL TURBINATE REDUCTION;  Surgeon: Osborn Coho, MD;  Location[facility] OR;  Service: ENT;  Laterality: Bilateral;  . PELVIC FLOOR REPAIR   3/04   SPARC with AP colporrhaphy  . PILONIDAL CYST EXCISION    . TOTAL VAGINAL HYSTERECTOMY  1989   with AP repair   Social History   Social History Narrative   Lives alone in a 2 story home. Has 2 children.  Works as a Catering manager.  Education: college.    Immunization History  Administered Date(s) Administered  . Fluad Quad(high Dose 65+) 10/14/2018  . Influenza, High Dose Seasonal PF 10/26/2017  . Influenza,inj,Quad PF,6+ Mos 10/18/2016  . Influenza-Unspecified 10/08/2014, 10/24/2015  . Janssen (J&J) SARS-COV-2 Vaccination 05/01/2019  . PFIZER SARS-COV-2 Vaccination 02/15/2020  . Pneumococcal-Unspecified 10/18/2007  . Tdap 11/24/2008  . Zoster Recombinat (Shingrix) 12/25/2016     Objective: Vital Signs: BP 120/79 (BP Location: Left Arm, Patient Position: Sitting, Cuff Size: Normal)   Pulse 68   Resp 15   Ht 5' 1.5" (1.562 m)   Wt 122 lb (55.3 kg)   LMP 02/15/1985 (Approximate)   BMI 22.68 kg/m    Physical Exam Vitals and nursing note reviewed.  Constitutional:      Appearance: She is well-developed and well-nourished.  HENT:     Head: Normocephalic and atraumatic.  Eyes:  Extraocular Movements: EOM normal.     Conjunctiva/sclera: Conjunctivae normal.  Cardiovascular:     Pulses: Intact distal pulses.  Pulmonary:     Effort: Pulmonary effort is normal.  Abdominal:     Palpations: Abdomen is soft.  Musculoskeletal:     Cervical back: Normal range of motion.  Skin:    General: Skin is warm and dry.     Capillary Refill: Capillary refill takes less than 2 seconds.  Neurological:     Mental Status: She is alert and oriented to person, place, and time.  Psychiatric:        Mood and Affect: Mood and affect normal.        Behavior: Behavior normal.      Musculoskeletal Exam: C-spine, thoracic spine, and lumbar spine good ROM.  Shoulder joints, elbow joints, and wrist joints good ROM with no tenderness or discomfort.  Ulnar deviation of all MCPs of the right  hand. PIP and DIP thickening noted. Hip joints good ROM with no discomfort.  Knee joints good ROM with no warmth or effusion.  Ankle joints good ROM with no tenderness or inflammation. Crepitus of both ankles noted.   CDAI Exam: CDAI Score: 0.6  Patient Global: 3 mm; Provider Global: 3 mm Swollen: 0 ; Tender: 0  Joint Exam 02/26/2020   No joint exam has been documented for this visit   There is currently no information documented on the homunculus. Go to the Rheumatology activity and complete the homunculus joint exam.  Investigation: No additional findings.  Imaging: No results found.  Recent Labs: Lab Results  Component Value Date   WBC 6.8 12/27/2019   HGB 12.2 12/27/2019   PLT 364 12/27/2019   NA 139 12/27/2019   K 4.7 12/27/2019   CL 104 12/27/2019   CO2 17 (L) 12/27/2019   GLUCOSE 132 (H) 12/27/2019   BUN 10 12/27/2019   CREATININE 0.49 (L) 12/27/2019   BILITOT 0.3 12/27/2019   ALKPHOS 76 12/27/2019   AST 19 12/27/2019   ALT 14 12/27/2019   PROT 6.1 12/27/2019   ALBUMIN 4.1 12/27/2019   CALCIUM 9.0 12/27/2019   GFRAA 116 12/27/2019   QFTBGOLD Negative 06/15/2016   QFTBGOLDPLUS NEGATIVE 01/23/2019    Speciality Comments: Orencia IV 500mg  every 4 weeks, patient infuses at [place] Tb gold negative 06/15/16  Procedures:  No procedures performed Allergies: Fish allergy, Iodine, Boniva [ibandronic acid], Erythromycin, Fosamax [alendronate], and Sulfonamide derivatives   Assessment / Plan:     Visit Diagnoses: Rheumatoid arthritis with rheumatoid factor of multiple sites without organ or systems involvement ([place]) - +RF, +CCP: She has no joint tenderness or synovitis on exam.  She has not had any recent rheumatoid arthritis flares.  She is clinically doing well on Orencia 500 mg IV infusions every 28 days.  According to the patient she had to postpone her infusion by 1 week due to [facility] being closed last week.  She will be going for her next infusion tomorrow.  She has  noticed some increased fatigue since having to postpone her infusion but has not noticed any increased joint pain, stiffness, or swelling.  She continues to have chronic pain in both feet due to underlying osteoarthritic changes.  She has good range of motion of both ankle joints on exam with no tenderness or inflammation.  We discussed the importance of wearing proper fitting shoes.  She plans on continuing to walk several miles on a daily basis for exercise.  She will continue receiving Orencia  500 mg IV infusions every 28 days.  She was advised to notify us if she develops increased joint pain or joint swelling.  She will follow-up in the office in 5 months  High risk medication use - Orencia IV infusion 500 mg every 28 days at [facility].  (inadequate response to Enbrel and Humira). CBC and CMP drawn on 12/27/19.  She will be due to update lab work February and every 3 months.  TB gold negative on 01/23/19. She is due to update TB gold.  Order released.   - Plan: QuantiFERON-TB Gold Plus She was advised to postpone Orencia if she develops signs or symptoms of an infection. She received the Jaseen vaccine on 05/01/19 and pfizer vaccine on 02/15/20.  She continues to have skin exams while on Orencia.  She underwent Mohs surgery for squamous cell carcinoma on 12/20/2019.  Screening for tuberculosis - TB gold ordered today. Plan: QuantiFERON-TB Gold Plus  DDD (degenerative disc disease), cervical: She has good ROM of the C-spine with no discomfort.   DDD (degenerative disc disease), lumbar - S/p lumbar laminectomy on 04/06/18 by Dr. Ronnald Ramp.  She uses salonpas patches as needed for pain relief.   Age-related osteoporosis without current pathological fracture - DEXA 02/12/2019: Right femoral neck BMD 0.552 with T score -2.7. She declined treatment for osteoporosis.    Vitamin D deficiency  Trochanteric bursitis of right hip - Resolved. She has occasional point tenderness.   Primary insomnia: She takes  restoril 30 mg at bedtime for insomnia.   Other medical conditions are listed as follows:   History of Chiari malformation  Gastroesophageal reflux disease without esophagitis  History of asthma  History of breast cancer  History of basal cell carcinoma  History of squamous cell carcinoma    Orders: Orders Placed This Encounter  Procedures  . QuantiFERON-TB Gold Plus   No orders of the defined types were placed in this encounter.    Follow-Up Instructions: Return in about 5 months (around 07/26/2020) for Rheumatoid arthritis, DDD.   Ofilia Neas, PA-C  Note - This record has been created using Dragon software.  Chart creation errors have been sought, but may not always  have been located. Such creation errors do not reflect on  the standard of medical care.

## 2020-02-15 DIAGNOSIS — Z23 Encounter for immunization: Secondary | ICD-10-CM | POA: Diagnosis not present

## 2020-02-26 ENCOUNTER — Encounter: Payer: Self-pay | Admitting: Physician Assistant

## 2020-02-26 ENCOUNTER — Other Ambulatory Visit: Payer: Self-pay

## 2020-02-26 ENCOUNTER — Ambulatory Visit (INDEPENDENT_AMBULATORY_CARE_PROVIDER_SITE_OTHER): Payer: Medicare Other | Admitting: Physician Assistant

## 2020-02-26 VITALS — BP 120/79 | HR 68 | Resp 15 | Ht 61.5 in | Wt 122.0 lb

## 2020-02-26 DIAGNOSIS — Z111 Encounter for screening for respiratory tuberculosis: Secondary | ICD-10-CM | POA: Diagnosis not present

## 2020-02-26 DIAGNOSIS — Z8589 Personal history of malignant neoplasm of other organs and systems: Secondary | ICD-10-CM

## 2020-02-26 DIAGNOSIS — Z8669 Personal history of other diseases of the nervous system and sense organs: Secondary | ICD-10-CM

## 2020-02-26 DIAGNOSIS — M0579 Rheumatoid arthritis with rheumatoid factor of multiple sites without organ or systems involvement: Secondary | ICD-10-CM | POA: Diagnosis not present

## 2020-02-26 DIAGNOSIS — F5101 Primary insomnia: Secondary | ICD-10-CM

## 2020-02-26 DIAGNOSIS — M5136 Other intervertebral disc degeneration, lumbar region: Secondary | ICD-10-CM

## 2020-02-26 DIAGNOSIS — Z853 Personal history of malignant neoplasm of breast: Secondary | ICD-10-CM

## 2020-02-26 DIAGNOSIS — Z85828 Personal history of other malignant neoplasm of skin: Secondary | ICD-10-CM

## 2020-02-26 DIAGNOSIS — K219 Gastro-esophageal reflux disease without esophagitis: Secondary | ICD-10-CM

## 2020-02-26 DIAGNOSIS — Z79899 Other long term (current) drug therapy: Secondary | ICD-10-CM

## 2020-02-26 DIAGNOSIS — E559 Vitamin D deficiency, unspecified: Secondary | ICD-10-CM

## 2020-02-26 DIAGNOSIS — M5030 Other cervical disc degeneration, unspecified cervical region: Secondary | ICD-10-CM | POA: Diagnosis not present

## 2020-02-26 DIAGNOSIS — M810 Age-related osteoporosis without current pathological fracture: Secondary | ICD-10-CM

## 2020-02-26 DIAGNOSIS — Z8709 Personal history of other diseases of the respiratory system: Secondary | ICD-10-CM

## 2020-02-26 DIAGNOSIS — M7061 Trochanteric bursitis, right hip: Secondary | ICD-10-CM

## 2020-02-26 DIAGNOSIS — M51369 Other intervertebral disc degeneration, lumbar region without mention of lumbar back pain or lower extremity pain: Secondary | ICD-10-CM

## 2020-02-27 DIAGNOSIS — M0579 Rheumatoid arthritis with rheumatoid factor of multiple sites without organ or systems involvement: Secondary | ICD-10-CM | POA: Diagnosis not present

## 2020-02-27 DIAGNOSIS — Z79899 Other long term (current) drug therapy: Secondary | ICD-10-CM | POA: Diagnosis not present

## 2020-02-27 NOTE — Addendum Note (Signed)
Addended by: Inis Sizer D on: 02/27/2020 09:31 AM   Modules accepted: Orders

## 2020-02-28 ENCOUNTER — Telehealth: Payer: Self-pay | Admitting: *Deleted

## 2020-02-28 LAB — CBC WITH DIFFERENTIAL/PLATELET
Basophils Absolute: 0 10*3/uL (ref 0.0–0.2)
Basos: 0 %
EOS (ABSOLUTE): 0.2 10*3/uL (ref 0.0–0.4)
Eos: 3 %
Hematocrit: 37.5 % (ref 34.0–46.6)
Hemoglobin: 12.3 g/dL (ref 11.1–15.9)
Immature Grans (Abs): 0 10*3/uL (ref 0.0–0.1)
Immature Granulocytes: 0 %
Lymphocytes Absolute: 1.9 10*3/uL (ref 0.7–3.1)
Lymphs: 25 %
MCH: 28.1 pg (ref 26.6–33.0)
MCHC: 32.8 g/dL (ref 31.5–35.7)
MCV: 86 fL (ref 79–97)
Monocytes Absolute: 0.6 10*3/uL (ref 0.1–0.9)
Monocytes: 7 %
Neutrophils Absolute: 5 10*3/uL (ref 1.4–7.0)
Neutrophils: 65 %
Platelets: 382 10*3/uL (ref 150–450)
RBC: 4.37 x10E6/uL (ref 3.77–5.28)
RDW: 13 % (ref 11.7–15.4)
WBC: 7.7 10*3/uL (ref 3.4–10.8)

## 2020-02-28 LAB — QUANTIFERON-TB GOLD PLUS
Mitogen-NIL: >10 IU/mL
NIL: 0.03 IU/mL
QuantiFERON-TB Gold Plus: NEGATIVE
TB1-NIL: 0 IU/mL
TB2-NIL: 0 IU/mL

## 2020-02-28 LAB — COMPREHENSIVE METABOLIC PANEL
ALT: 14 IU/L (ref 0–32)
AST: 18 IU/L (ref 0–40)
Albumin/Globulin Ratio: 2.4 — ABNORMAL HIGH (ref 1.2–2.2)
Albumin: 4.5 g/dL (ref 3.8–4.8)
Alkaline Phosphatase: 72 IU/L (ref 44–121)
BUN/Creatinine Ratio: 23 (ref 12–28)
BUN: 12 mg/dL (ref 8–27)
Bilirubin Total: 0.4 mg/dL (ref 0.0–1.2)
CO2: 22 mmol/L (ref 20–29)
Calcium: 9.3 mg/dL (ref 8.7–10.3)
Chloride: 104 mmol/L (ref 96–106)
Creatinine, Ser: 0.52 mg/dL — ABNORMAL LOW (ref 0.57–1.00)
GFR calc Af Amer: 114 mL/min/{1.73_m2} (ref >59)
GFR calc non Af Amer: 98 mL/min/{1.73_m2} (ref >59)
Globulin, Total: 1.9 g/dL (ref 1.5–4.5)
Glucose: 99 mg/dL (ref 65–99)
Potassium: 4.1 mmol/L (ref 3.5–5.2)
Sodium: 140 mmol/L (ref 134–144)
Total Protein: 6.4 g/dL (ref 6.0–8.5)

## 2020-02-28 NOTE — Progress Notes (Signed)
TB gold negative

## 2020-02-28 NOTE — Telephone Encounter (Signed)
-----   Message from Britt Bottom, MD sent at 02/28/2020 10:13 AM EST ----- Please let the patient know that the lab work is fine.

## 2020-02-28 NOTE — Telephone Encounter (Signed)
Called and spoke with pt about lab results. She verbalized understanding. She is requesting results be mailed to her, I verified address on file.

## 2020-03-26 DIAGNOSIS — M0519 Rheumatoid lung disease with rheumatoid arthritis of multiple sites: Secondary | ICD-10-CM | POA: Diagnosis not present

## 2020-03-31 ENCOUNTER — Encounter: Payer: Self-pay | Admitting: Neurology

## 2020-03-31 ENCOUNTER — Other Ambulatory Visit: Payer: Self-pay

## 2020-03-31 ENCOUNTER — Ambulatory Visit (INDEPENDENT_AMBULATORY_CARE_PROVIDER_SITE_OTHER): Payer: Medicare Other | Admitting: Neurology

## 2020-03-31 VITALS — BP 121/70 | HR 89 | Ht 61.5 in | Wt 121.5 lb

## 2020-03-31 DIAGNOSIS — G587 Mononeuritis multiplex: Secondary | ICD-10-CM

## 2020-03-31 DIAGNOSIS — E559 Vitamin D deficiency, unspecified: Secondary | ICD-10-CM | POA: Diagnosis not present

## 2020-03-31 DIAGNOSIS — Z79899 Other long term (current) drug therapy: Secondary | ICD-10-CM | POA: Diagnosis not present

## 2020-03-31 DIAGNOSIS — G603 Idiopathic progressive neuropathy: Secondary | ICD-10-CM | POA: Diagnosis not present

## 2020-03-31 DIAGNOSIS — G5621 Lesion of ulnar nerve, right upper limb: Secondary | ICD-10-CM

## 2020-03-31 DIAGNOSIS — Z8669 Personal history of other diseases of the nervous system and sense organs: Secondary | ICD-10-CM | POA: Diagnosis not present

## 2020-03-31 DIAGNOSIS — R200 Anesthesia of skin: Secondary | ICD-10-CM

## 2020-03-31 DIAGNOSIS — M0579 Rheumatoid arthritis with rheumatoid factor of multiple sites without organ or systems involvement: Secondary | ICD-10-CM

## 2020-03-31 DIAGNOSIS — G629 Polyneuropathy, unspecified: Secondary | ICD-10-CM | POA: Diagnosis not present

## 2020-03-31 NOTE — Progress Notes (Signed)
[facility]  [name] DOB: 01/07/1951  REFERRING DOCTOR OR PCP:  Dr. Estanislado Pandy SOURCE: patient, notes from Dr. Estanislado Pandy, labs  _________________________________   HISTORICAL  CHIEF COMPLAINT:  Chief Complaint  Patient presents with  . Follow-up    RM 13, alone. Last seen 03/29/2019. Berton Lan for RA q4wk at [facility] w/ intrafusion. Last infusion 03/26/20, next: 04/23/20. Doing well, no new sx.    HISTORY OF PRESENT ILLNESS:  Kristy Perez is a 70 y.o. woman on Orencia infusions for her rheumatoid arthritis.  Update 03/31/2020: She is on Orencia for rheumatoid arthritis. She tolerates it well. Symptoms improved but she continues to have some foot pain and some neck pain and stiffness, especially the last couple weeks of each cycle. In 2020 she had L2-L3 lumbar surgery.  Lab work has been fine.  She has mild numbness in her fingertips bilaterally.  She notes textures are harder to distinguish and less ability to recognize objects in pockets.   She also has reduced strength in her hands.   She notes a riht foot drop if she walks longer.   She has mild numbness in toes.    She also has a Chiari malformation. She denies headaches or syncope. She does note that if she calls she gets a headache. She has had some hoarseness of her voice and has seen ENT. The vocal cords look fine.  I reviewed her past MRI.  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     She has 5 mm of cerebellar ectopia.    She has a small Chiari malformation noted on imaging studies. It extends about 5 mm.   She gets brain or cervical spine studies periodically to follow this as she is at risk due to her rheumatoid arthritis and potential pannus development. Adjacent brain appears normal.  Rheumatoid arthritis history: She had had problems with joint pain, especially in her feet times many years. Around 2002, she was diagnosed with rheumatoid arthritis after a rheumatoid factor  returned positive. He was to start methotrexate and Plaquenil but was diagnosed with breast cancer around that time so those medicines were discontinued while she was undergoing treatment with breast cancer. She had a complete response to her treatment (surgery, chemotherapy and radiation) remains cancer free.   After her chemotherapy, she went back on plaque on until plus methotrexate injections. About 12 years ago, she switched to Humira and was on that for about 8 years. She had many dental issues including abscesses and it was discontinued. Enbrel was tried but it was less effective for her RA. About 4 years ago she started Orencia infusions. She has tolerated them well and has not had any problems with infections. Typically, the day of the infusion she will fill tired or achy. The next day she may also feel tired. The rest of the month she does well..    She is on Orencia 500 mg IV every 4 weeks.    She had her last infusion 06/15/2016.     She has allergies to Iodine injection (xray/CT) and fish.  She does ok with Betadine.    Besides breast cancer, she has had had several Basal cell and squamous cell cancers.   She had Mohs surgery on her face.      Chiari malformation history: She also has had a Chiari Malformation and was seeing Dr. Carloyn Manner of Neurosurgery.   Because she also has risk of pannus development from her rheumatoid arthritis, and  the MRI on an annual or near annual basis.   Imaging: MRI of her brain January 2018 shows a borderline Chiari malformation (looked mild on prior MRI). The adjacent brain and spinal cord have normal signal.  She has some degenerative pannus at C1-C2.  MRI of the lumbar spine 06/20/2019 shows postoperative changes at L2-L3 (right extraforaminal microdiscectomy) there is stable grade 1 spondylolisthesis due to pars defects at L5 and associated with facet hypertrophy and foraminal narrowing.  REVIEW OF SYSTEMS: Constitutional: No fevers, chills, sweats, or change in  appetite.    She gets fatigue the last week of each Orencia cycle.     Eyes: No visual changes, double vision, eye pain Ear, nose and throat: No hearing loss, ear pain, nasal congestion, sore throat Cardiovascular: No chest pain, palpitations Respiratory: No shortness of breath at rest or with exertion.   No wheezes GastrointestinaI: No nausea, vomiting, diarrhea, abdominal pain, fecal incontinence.   She has GERD Genitourinary: No dysuria, urinary retention.   She notes urinary frequency and nocturia. Musculoskeletal: as above Integumentary: No rash, pruritus, skin lesions Neurological: as above.   She notes decreased focus, improved with Adderall Psychiatric: No depression at this time.  No anxiety Endocrine: No palpitations, diaphoresis, change in appetite, change in weigh or increased thirst Hematologic/Lymphatic: No anemia, purpura, petechiae. Allergic/Immunologic: No itchy/runny eyes, nasal congestion, recent allergic reactions, rashes  ALLERGIES: Allergies  Allergen Reactions  . Fish Allergy Anaphylaxis  . Iodine Anaphylaxis    " PER PT IV CONTRAST"  . Boniva [Ibandronic Acid]     vomiting   . Erythromycin     UNSPECIFIED REACTION   . Fosamax [Alendronate]     vomiting   . Sulfonamide Derivatives Other (See Comments)    Tongue turns black.    HOME MEDICATIONS:  Current Outpatient Medications:  .  Abatacept (ORENCIA IV), Inject 500 mg into the vein every 30 (thirty) days. Infuse 2 vials / 500 mg over 30 minutes every 4 weeks, Disp: , Rfl:  .  acetaminophen (TYLENOL) 325 MG tablet, Take 650 mg by mouth daily as needed (One hour prior to infusion). , Disp: , Rfl:  .  albuterol (VENTOLIN HFA) 108 (90 Base) MCG/ACT inhaler, Inhale 2 puffs into the lungs every 6 (six) hours as needed for wheezing or shortness of breath., Disp: , Rfl:  .  amoxicillin (AMOXIL) 500 MG capsule, Take 2,000 mg by mouth See admin instructions. Take 2000 mg by mouth 1 hour prior to dental  appointment, Disp: , Rfl: 2 .  amphetamine-dextroamphetamine (ADDERALL) 5 MG tablet, Take 10 mg by mouth daily as needed (stress). , Disp: , Rfl: 0 .  budesonide-formoterol (SYMBICORT) 80-4.5 MCG/ACT inhaler, INHALE 2 PUFFS FIRST THING IN THE MORNING AND THEN ANOTHER 2 PUFFS ABOUT 12 HOURS LATER, Disp: 10.2 g, Rfl: 0 .  chlorpheniramine (CHLOR-TRIMETON) 4 MG tablet, Take 4 mg by mouth daily as needed for allergies., Disp: , Rfl:  .  diphenhydrAMINE (BENADRYL) 25 mg capsule, Take 25 mg by mouth daily as needed (One hour prior to infusion). , Disp: , Rfl:  .  famotidine (PEPCID) 20 MG tablet, One after  bfast and supper, Disp: 60 tablet, Rfl: 11 .  ibuprofen (ADVIL,MOTRIN) 200 MG tablet, Take 400 mg by mouth every 6 (six) hours as needed (for inflammation)., Disp: , Rfl:  .  Liniments (SALONPAS EX), Apply topically as needed., Disp: , Rfl:  .  Magnesium 250 MG TABS, Take 500-1,000 tablets by mouth daily. , Disp: , Rfl:  .  NONFORMULARY OR COMPOUNDED ITEM, Estradiol 0.02% vaginal cream.  Place 1/2 gram vaginally two to three times weekly at bedtime. Disp:  3 month supply (Patient taking differently: Place 0.5 Applicatorfuls vaginally once a week. Estradiol 0.02% vaginal cream.  Place 1/2 gram vaginally two to three times weekly at bedtime. Disp:  3 month supply), Disp: 1 each, Rfl: 3 .  Omega-3 Fatty Acids (FISH OIL) 1000 MG CAPS, Take 1,000 mg by mouth daily., Disp: , Rfl:  .  promethazine (PHENERGAN) 25 MG tablet, Take 25 mg by mouth every 6 (six) hours as needed for nausea or vomiting., Disp: , Rfl:  .  RESTASIS 0.05 % ophthalmic emulsion, Place 1 drop into both eyes every morning. , Disp: , Rfl:  .  Sodium Fluoride 1.1 % PSTE, Place 1 application onto teeth 2 (two) times daily. , Disp: , Rfl:  .  temazepam (RESTORIL) 30 MG capsule, Take 30 mg by mouth at bedtime. , Disp: , Rfl:   PAST MEDICAL HISTORY: Past Medical History:  Diagnosis Date  . Anemia   . Asthma    inhaler one x per day  . Cancer  [facility])    breast, left  . Chiari malformation   . Difficult intubation    cannot hyperextend neck due to Chiari malformation with tonsillar involvement, RA  . Dysrhythmia 2015   history of PAC's  . GERD (gastroesophageal reflux disease)   . High cholesterol    dx by PCP per patient   . PONV (postoperative nausea and vomiting)   . Rheumatoid arthritis ([place])    on Humira    PAST SURGICAL HISTORY: Past Surgical History:  Procedure Laterality Date  . BREAST SURGERY Left 2003   Lumpectomy T1C1, NO and negative ER/ PR  . COLONOSCOPY  10/04  . COLONOSCOPY  11/10   normal recheck in 5 years  . COLPORRHAPHY    . ESOPHAGOGASTRODUODENOSCOPY ENDOSCOPY  11/10   GERD  . FOOT SURGERY Left age 20   removal of pseudo rheumatoid nodules from left forefoot.  Marland Kitchen LAPAROSCOPIC BILATERAL SALPINGO OOPHERECTOMY N/A 07/30/2013   Procedure: LAPAROSCOPIC BILATERAL SALPINGO OOPHORECTOMY WITH WASHINGS;  Surgeon: Lyman Speller, MD;  Location: [facility];  Service: Gynecology;  Laterality: N/A;  . LUMBAR LAMINECTOMY/DECOMPRESSION MICRODISCECTOMY Right 04/06/2018   Procedure: Extraforaminal Microdiscectomy - Lumbar Two-Lumbar Three - right;  Surgeon: Eustace Lines, MD;  Location: [facility];  Service: Neurosurgery;  Laterality: Right;  Extraforaminal Microdiscectomy - Lumbar Two-Lumbar Three - right  . NASAL SEPTOPLASTY W/ TURBINOPLASTY Bilateral 05/24/2014   Procedure: NASAL SEPTOPLASTY WITH  BILATERAL TURBINATE REDUCTION;  Surgeon: Jerrell Belfast, MD;  Location: [facility];  Service: ENT;  Laterality: Bilateral;  . PELVIC FLOOR REPAIR  3/04   SPARC with AP colporrhaphy  . PILONIDAL CYST EXCISION    . TOTAL VAGINAL HYSTERECTOMY  1989   with AP repair    FAMILY HISTORY: Family History  Problem Relation Age of Onset  . Diabetes Mother   . Heart failure Mother   . Bladder Cancer Mother   . Stroke Mother   . Cancer Father        head and neck  . Uterine cancer Maternal Aunt   . Bladder Cancer Maternal Grandmother    . Colon cancer Maternal Grandmother     SOCIAL HISTORY:  Social History   Socioeconomic History  . Marital status: Divorced    Spouse name: Not on file  . Number of children: 2  . Years of education: Not on file  .  Highest education level: Not on file  Occupational History  . Not on file  Tobacco Use  . Smoking status: Former Smoker    Packs/day: 1.50    Years: 30.00    Pack years: 45.00    Types: Cigarettes    Quit date: 02/16/1999    Years since quitting: 21.1  . Smokeless tobacco: Never Used  Vaping Use  . Vaping Use: Never used  Substance and Sexual Activity  . Alcohol use: No  . Drug use: No  . Sexual activity: Not Currently    Partners: Male  Other Topics Concern  . Not on file  Social History Narrative   Lives alone in a 2 story home. Has 2 children.  Works as a Radiation protection practitioner.  Education: college.    Social Determinants of Health   Financial Resource Strain: Not on file  Food Insecurity: Not on file  Transportation Needs: Not on file  Physical Activity: Not on file  Stress: Not on file  Social Connections: Not on file  Intimate Partner Violence: Not on file     PHYSICAL EXAM  Vitals:   03/31/20 0936  BP: 121/70  Pulse: 89  SpO2: 97%  Weight: 121 lb 8 oz (55.1 kg)  Height: 5' 1.5" (1.562 m)    Body mass index is 22.59 kg/m.   General: The patient is well-developed and well-nourished and in no acute distress   Neck:  The neck is nontender.   Skin: Extremities are without rash or edema.  Musculoskeletal:  Back is nontender.   Joints in her feet and hands are tender.   She has ulnar deviation of the fingers bilaterally, much worse on the right.  She has a nodule on the right wrist.  Neurologic Exam  Mental status: The patient is alert and oriented x 3 at the time of the examination. The patient has apparent normal recent and remote memory, with an apparently normal attention span and concentration ability.   Speech is normal.  Cranial  nerves: Extraocular movements are full. Facial strength and sensation is normal.  Trapezius and sternocleidomastoid strength is normal.  . No obvious hearing deficits are noted.  Motor:  Muscle tone is normal. Muscle bulk is reduced in the ulnar innervated right hand muscles.  3 to 4-/5 right ulnar hand muscle strength.  4+/5 bilateral APB muscles (median) 4/5 right EHL and 4+/5 right ankle extensors .  4+/5 left EHL.     Strength is 5/5.elsewhere  Sensory;  Tienls at right wrist. Reduced pinprick but normal vibration in fingertips, superimpose reduced sensation to pp hypothenar eminence, right worse than left.  Reduced sensation to vibration in toes (near absent but normal at ankles).  Reduced pp in toes adjacent feet, right peroneal/L5 distribution moreso than lateral foot   Just mildly redued pinprick plantars.    Coordination: Cerebellar testing reveals good finger-nose-finger bilaterally.   Mild reduced coordination in hands and toe tap.    Gait and station: Station is normal.  The gait is slightly ataxic, arthritic and she has a very mild right foot drop.  The tandem gait is mildly wide.  Romberg is negative.  Reflexes: Deep tendon reflexes are symmetric and normal bilaterally except absnet at ankles.Marland Kitchen        DIAGNOSTIC DATA (LABS, IMAGING, TESTING) - I reviewed patient records, labs, notes, testing and imaging myself where available.  Lab Results  Component Value Date   WBC 7.7 02/27/2020   HGB 12.3 02/27/2020   HCT 37.5 02/27/2020  MCV 86 02/27/2020   PLT 382 02/27/2020      Component Value Date/Time   NA 140 02/27/2020 0932   K 4.1 02/27/2020 0932   CL 104 02/27/2020 0932   CO2 22 02/27/2020 0932   GLUCOSE 99 02/27/2020 0932   GLUCOSE 91 03/30/2018 1051   BUN 12 02/27/2020 0932   CREATININE 0.52 (L) 02/27/2020 0932   CALCIUM 9.3 02/27/2020 0932   PROT 6.4 02/27/2020 0932   ALBUMIN 4.5 02/27/2020 0932   AST 18 02/27/2020 0932   ALT 14 02/27/2020 0932   ALKPHOS 72  02/27/2020 0932   BILITOT 0.4 02/27/2020 0932   GFRNONAA 98 02/27/2020 0932   GFRAA 114 02/27/2020 0932   Lab Results  Component Value Date   CHOL 228 (H) 07/07/2017   HDL 68 07/07/2017   LDLCALC 142 (H) 07/07/2017   TRIG 91 07/07/2017   CHOLHDL 3.4 07/07/2017   Lab Results  Component Value Date   HGBA1C 5.2 03/08/2016   No results found for: VITAMINB12 Lab Results  Component Value Date   TSH 1.20 03/08/2016       ASSESSMENT AND PLAN  Rheumatoid arthritis with rheumatoid factor of multiple sites without organ or systems involvement ([place]) - Plan: CBC with Differential/Platelet, Comprehensive metabolic panel, Rheumatoid factor  High risk medication use - Plan: CBC with Differential/Platelet, Comprehensive metabolic panel  History of Chiari malformation  Numbness - Plan: Multiple Myeloma Panel (SPEP&IFE w/QIG), NCV with EMG(electromyography), CANCELED: NCV with EMG(electromyography)  Mononeuropathy multiplex syndrome - Plan: Multiple Myeloma Panel (SPEP&IFE w/QIG), Rheumatoid factor, Sjogren's syndrome antibods(ssa + ssb), Sedimentation rate, C-reactive protein, NCV with EMG(electromyography), CANCELED: NCV with EMG(electromyography)  Neuritis of right ulnar nerve - Plan: NCV with EMG(electromyography), CANCELED: NCV with EMG(electromyography)  Polyneuropathy - Plan: Multiple Myeloma Panel (SPEP&IFE w/QIG), Vitamin B12, Sjogren's syndrome antibods(ssa + ssb), Sedimentation rate, C-reactive protein, NCV with EMG(electromyography), CANCELED: NCV with EMG(electromyography)  Vitamin D deficiency - Plan: VITAMIN D 25 Hydroxy (Vit-D Deficiency, Fractures)  Idiopathic progressive neuropathy  - Plan: Vitamin B12    1.    Continue Orencia 500 mg every 4 weeks.  Labs will be checked every 8 weeks.  We will check the labs a little bit early since she is here today. 2.    The Chiari malformation appears to be stable.  It is fairly mild and there is no pegging of the cerebellar  tonsils against  the brainstem.  She does not have significant symptoms from it. 3.    She has had more numbness in her hands as well as some numbness in the feet and reduced strength of the hands.  On exam, she has findings that could be consistent with a mononeuropathy multiplex with a mild length dependent polyneuropathy and superimposed right ulnar neuropathy and milder right peroneal and bilateral median neuropathies.  We will check some blood work and check an NCV/EMG to further characterize.  4.  return in one year for general visit in regards to infusion.  I will see her when she comes in for the NCV/EMG and will arrange additional testing or visits depending on results..   She will call sooner if she has any new or worsening neurologic symptoms.   45-minute office visit with the majority of the time spent face-to-face for history and physical, discussion/counseling and decision-making.  Additional time with record review and documentation.    Richard A. Felecia Shelling, MD, PhD 08/13/6379, 77:11 PM Certified in Neurology, Clinical Neurophysiology, Sleep Medicine, Pain Medicine and Neuroimaging  [facility]  [address], [address] [place], [place] [address] [phone]

## 2020-04-01 ENCOUNTER — Ambulatory Visit (INDEPENDENT_AMBULATORY_CARE_PROVIDER_SITE_OTHER): Payer: Medicare Other | Admitting: Neurology

## 2020-04-01 ENCOUNTER — Encounter (INDEPENDENT_AMBULATORY_CARE_PROVIDER_SITE_OTHER): Payer: Medicare Other | Admitting: Neurology

## 2020-04-01 ENCOUNTER — Telehealth: Payer: Self-pay | Admitting: Neurology

## 2020-04-01 DIAGNOSIS — M5136 Other intervertebral disc degeneration, lumbar region: Secondary | ICD-10-CM

## 2020-04-01 DIAGNOSIS — M47812 Spondylosis without myelopathy or radiculopathy, cervical region: Secondary | ICD-10-CM

## 2020-04-01 DIAGNOSIS — R200 Anesthesia of skin: Secondary | ICD-10-CM | POA: Diagnosis not present

## 2020-04-01 DIAGNOSIS — G629 Polyneuropathy, unspecified: Secondary | ICD-10-CM

## 2020-04-01 DIAGNOSIS — Z0289 Encounter for other administrative examinations: Secondary | ICD-10-CM

## 2020-04-01 DIAGNOSIS — G5621 Lesion of ulnar nerve, right upper limb: Secondary | ICD-10-CM

## 2020-04-01 DIAGNOSIS — G587 Mononeuritis multiplex: Secondary | ICD-10-CM

## 2020-04-01 DIAGNOSIS — M0579 Rheumatoid arthritis with rheumatoid factor of multiple sites without organ or systems involvement: Secondary | ICD-10-CM

## 2020-04-01 DIAGNOSIS — Z8669 Personal history of other diseases of the nervous system and sense organs: Secondary | ICD-10-CM

## 2020-04-01 MED ORDER — VITAMIN D (ERGOCALCIFEROL) 1.25 MG (50000 UNIT) PO CAPS: 50000.0000 [IU] | ORAL_CAPSULE | ORAL | 0 refills | Status: DC

## 2020-04-01 NOTE — Progress Notes (Signed)
[facility]   Full Name: Kristy Perez Gender: Female MRN #: [id number] Date of Birth: [birth year]    Visit Date: 04/01/2020 07:32 Age: 70 Years Examining Physician: Arlice Colt, MD  Referring Physician: Arlice Colt, MD    HISTORY: Kristy Perez is a 70 year old woman with rheumatoid arthritis who has weakness in the right arm and numbness in both arms, right greater than left.  She also has milder weakness and numbness in the feet.  On examination, there is atrophy of the ulnar innervated intrinsic hand muscles on the right.  Muscle bulk is reduced in the ulnar innervated right hand muscles.  3 to 4-/5 right ulnar hand muscle strength.  4+/5 bilateral APB muscles (median) 4/5 right EHL and 4+/5 right ankle extensors .  4+/5 left EHL.     Strength is 5/5.elsewhere.  Reduced pinprick but normal vibration in fingertips, superimpose reduced sensation to pp hypothenar eminence, right worse than left.  Reduced sensation to vibration in toes (near absent but normal at ankles).  NERVE CONDUCTION STUDIES: The right median, ulnar, peroneal and tibial motor responses had normal distal latencies, amplitudes and conduction velocities.  The tibial and ulnar F-wave latencies were normal.    The right radial, sural, superficial peroneal and ulnar , and orthodromic median sensory responses were normal.  The right antidromic median sensory response was minimally slowed across the wrist with normal amplitude.  The galvanic sympathetic skin response was absent in the left foot but normal on the left hand.  EMG STUDIES: Needle EMG of selected muscles of the right arm showed normal motor unit morphology and recruitment in all of the muscles tested.  There was no abnormal spontaneous activity.  Needle EMG of selected muscles of the right leg showed mild chronic denervation in the iliopsoas, vastus medialis, peroneus longus and abductor hallucis muscles.  The gluteus medius muscle was normal..   There was no abnormal spontaneous activity.  IMPRESSION:  This NCV/EMG study showed the following: 1.  No evidence of polyneuropathy 2.  Borderline median neuropathy across the right wrist.  There is no evidence of right focal ulnar neuropathy. 3.  Mild right L3 (+/- L4),  L5 and S1 chronic radiculopathies  Kristy Perez A. Felecia Shelling, MD, PhD, FAAN Certified in Neurology, Clinical Neurophysiology, Sleep Medicine, Pain Medicine and Neuroimaging Director, [place] at [facility] [address], [place], [place] [address] [phone]  Clinical note: The NCV/EMG study was essentially normal not showing an explanation for the majority of her neurologic symptoms.  I will check an MRI of the cervical spine to further evaluate the possibility of significant spinal stenosis or myelopathy.  -- RAS  Verbal informed consent was obtained from the patient, patient was informed of potential risk of procedure, including bruising, bleeding, hematoma formation, infection, muscle weakness, muscle pain, numbness, among others.        [place]    Nerve / Sites Muscle Latency Ref. Amplitude Ref. Rel Amp Segments Distance Velocity Ref. Area    ms ms mV mV %  cm m/s m/s mVms  R Median - APB     Wrist APB 3.9 ?4.4 6.5 ?4.0 100 Wrist - APB 7   17.1     Upper arm APB 7.9  6.0  92.2 Upper arm - Wrist 21 53 ?49 17.2  R Ulnar - ADM     Wrist ADM 3.2 ?3.3 6.3 ?6.0 100 Wrist - ADM 7   31.1  B.Elbow ADM 6.2  5.8  91.6 B.Elbow - Wrist 18 60 ?49 28.7     A.Elbow ADM 8.0  6.0  104 A.Elbow - B.Elbow 10 56 ?49 25.0  R Peroneal - EDB     Ankle EDB 4.8 ?6.5 4.2 ?2.0 100 Ankle - EDB 9   15.6     Fib head EDB 11.1  3.8  91.1 Fib head - Ankle 28 44 ?44 14.6     Pop fossa EDB 13.2  4.2  110 Pop fossa - Fib head 10 48 ?44 14.6         Pop fossa - Ankle      R Tibial - AH     Ankle AH 4.7 ?5.8 4.1 ?4.0 100 Ankle - AH 9   9.1     Pop fossa AH 12.9  3.5  86.1  Pop fossa - Ankle 37 45 ?41 14.0             SSR    Nerve / Sites Latency   s  R Sympathetic - Palm     Palm 2.43  R Sympathetic - Foot     Foot NR            SNC    Nerve / Sites Rec. Site Peak Lat Ref.  Amp Ref. Segments Distance Peak Diff Ref.    ms ms V V  cm ms ms  R Radial - Anatomical snuff box (Forearm)     Forearm Wrist 2.7 ?2.9 53 ?15 Forearm - Wrist 10    R Sural - Ankle (Calf)     Calf Ankle 3.5 ?4.4 14 ?6 Calf - Ankle 14    R Superficial peroneal - Ankle     Lat leg Ankle 3.9 ?4.4 7 ?6 Lat leg - Ankle 14    R Median, Ulnar - Transcarpal comparison     Median Palm Wrist 2.3 ?2.2 37 ?35 Median Palm - Wrist 8       Ulnar Palm Wrist 2.1 ?2.2 24 ?12 Ulnar Palm - Wrist 8          Median Palm - Ulnar Palm  0.2 ?0.4  R Median - Orthodromic (Dig II, Mid palm)     Dig II Wrist 3.3 ?3.4 13 ?10 Dig II - Wrist 13    R Ulnar - Orthodromic, (Dig V, Mid palm)     Dig V Wrist 2.9 ?3.1 6 ?5 Dig V - Wrist 77                   F  Wave    Nerve F Lat Ref.   ms ms  R Tibial - AH 54.0 ?56.0  R Ulnar - ADM 26.6 ?32.0         EMG Summary Table    Spontaneous MUAP Recruitment  Muscle IA Fib PSW Fasc Other Amp Dur. Poly Pattern  R. Deltoid Normal None None None _______ Normal Normal Normal Normal  R. Triceps brachii Normal None None None _______ Normal Normal Normal Normal  R. Biceps brachii Normal None None None _______ Normal Normal Normal Normal  R. Extensor digitorum communis Normal None None None _______ Normal Normal Normal Normal  R. Flexor carpi ulnaris Normal None None None _______ Normal Normal Normal Normal  R. First dorsal interosseous Normal None None None _______ Normal Normal Normal Normal  R. Abductor pollicis brevis Normal None None None _______ Normal Normal Normal Normal  R. Abductor digiti minimi (manus) Normal None None  None _______ Normal Normal Normal Normal  R. Vastus medialis Normal None None None _______ Normal Increased 1+ Reduced  R. Gastrocnemius  (Medial head) Normal None None None _______ Increased Increased 1+ Reduced  R. Tibialis anterior Normal None None None _______ Normal Normal 1+ Reduced  R. Peroneus longus Normal None None None _______ Normal Increased 1+ Reduced  R. Abductor hallucis Normal None None None _______ Increased Increased 1+ Reduced  R. Gluteus medius Normal None None None _______ Normal Normal Normal Normal  R. Iliopsoas Normal None None None _______ Normal Increase 1+ Reduced

## 2020-04-01 NOTE — Telephone Encounter (Signed)
Medicare/aetna supp order sent to GI. No auth they will reach out to the patient to schedule.

## 2020-04-02 ENCOUNTER — Telehealth: Payer: Self-pay | Admitting: *Deleted

## 2020-04-02 LAB — COMPREHENSIVE METABOLIC PANEL
ALT: 15 IU/L (ref 0–32)
AST: 19 IU/L (ref 0–40)
Albumin/Globulin Ratio: 2.6 — ABNORMAL HIGH (ref 1.2–2.2)
Albumin: 4.5 g/dL (ref 3.8–4.8)
Alkaline Phosphatase: 80 IU/L (ref 44–121)
BUN/Creatinine Ratio: 21 (ref 12–28)
BUN: 11 mg/dL (ref 8–27)
Bilirubin Total: 0.2 mg/dL (ref 0.0–1.2)
CO2: 22 mmol/L (ref 20–29)
Calcium: 9.3 mg/dL (ref 8.7–10.3)
Chloride: 100 mmol/L (ref 96–106)
Creatinine, Ser: 0.52 mg/dL — ABNORMAL LOW (ref 0.57–1.00)
GFR calc Af Amer: 114 mL/min/{1.73_m2} (ref >59)
GFR calc non Af Amer: 98 mL/min/{1.73_m2} (ref >59)
Globulin, Total: 1.7 g/dL (ref 1.5–4.5)
Glucose: 93 mg/dL (ref 65–99)
Potassium: 3.9 mmol/L (ref 3.5–5.2)
Sodium: 141 mmol/L (ref 134–144)
Total Protein: 6.2 g/dL (ref 6.0–8.5)

## 2020-04-02 LAB — CBC WITH DIFFERENTIAL/PLATELET
Basophils Absolute: 0 10*3/uL (ref 0.0–0.2)
Basos: 1 %
EOS (ABSOLUTE): 0.2 10*3/uL (ref 0.0–0.4)
Eos: 3 %
Hematocrit: 39 % (ref 34.0–46.6)
Hemoglobin: 12.8 g/dL (ref 11.1–15.9)
Immature Grans (Abs): 0 10*3/uL (ref 0.0–0.1)
Immature Granulocytes: 0 %
Lymphocytes Absolute: 1.7 10*3/uL (ref 0.7–3.1)
Lymphs: 22 %
MCH: 28.6 pg (ref 26.6–33.0)
MCHC: 32.8 g/dL (ref 31.5–35.7)
MCV: 87 fL (ref 79–97)
Monocytes Absolute: 0.5 10*3/uL (ref 0.1–0.9)
Monocytes: 6 %
Neutrophils Absolute: 5.5 10*3/uL (ref 1.4–7.0)
Neutrophils: 68 %
Platelets: 385 10*3/uL (ref 150–450)
RBC: 4.48 x10E6/uL (ref 3.77–5.28)
RDW: 13.2 % (ref 11.7–15.4)
WBC: 8 10*3/uL (ref 3.4–10.8)

## 2020-04-02 LAB — MULTIPLE MYELOMA PANEL, SERUM
Albumin SerPl Elph-Mcnc: 3.6 g/dL (ref 2.9–4.4)
Albumin/Glob SerPl: 1.4 (ref 0.7–1.7)
Alpha 1: 0.2 g/dL (ref 0.0–0.4)
Alpha2 Glob SerPl Elph-Mcnc: 0.8 g/dL (ref 0.4–1.0)
B-Globulin SerPl Elph-Mcnc: 1.1 g/dL (ref 0.7–1.3)
Gamma Glob SerPl Elph-Mcnc: 0.5 g/dL (ref 0.4–1.8)
Globulin, Total: 2.6 g/dL (ref 2.2–3.9)
IgA/Immunoglobulin A, Serum: 227 mg/dL (ref 87–352)
IgG (Immunoglobin G), Serum: 508 mg/dL — ABNORMAL LOW (ref 586–1602)
IgM (Immunoglobulin M), Srm: 44 mg/dL (ref 26–217)

## 2020-04-02 LAB — SJOGREN'S SYNDROME ANTIBODS(SSA + SSB)
ENA SSA (RO) Ab: <0.2 AI (ref 0.0–0.9)
ENA SSB (LA) Ab: <0.2 AI (ref 0.0–0.9)

## 2020-04-02 LAB — RHEUMATOID FACTOR: Rheumatoid fact SerPl-aCnc: 62.7 IU/mL — ABNORMAL HIGH (ref <14.0)

## 2020-04-02 LAB — VITAMIN B12: Vitamin B-12: 280 pg/mL (ref 232–1245)

## 2020-04-02 LAB — VITAMIN D 25 HYDROXY (VIT D DEFICIENCY, FRACTURES): Vit D, 25-Hydroxy: 20.4 ng/mL — ABNORMAL LOW (ref 30.0–100.0)

## 2020-04-02 LAB — C-REACTIVE PROTEIN: CRP: 1 mg/L (ref 0–10)

## 2020-04-02 LAB — SEDIMENTATION RATE: Sed Rate: 2 mm/hr (ref 0–40)

## 2020-04-02 NOTE — Telephone Encounter (Signed)
-----   Message from Britt Bottom, MD sent at 04/02/2020 12:49 PM EST ----- Let her know that most her lab work was fine.  The rheumatoid factor was elevated, as expected due to your rheumatoid arthritis (was 62).  Vitamin D was low.  I recommend that you take 5000 units a day (OTC)

## 2020-04-02 NOTE — Telephone Encounter (Signed)
Called and spoke with pt. Relayed results per Dr. Felecia Shelling note. Pt states MD called in 50000U once weekly x13 weeks yesterday for her. She will do this and then transition to 5000U/day OTC once finished with high dose. She would like copy of results mailed to her. Advised I will send request to Hilda Blades in our medical records department. Pt verbalized understanding.

## 2020-04-03 ENCOUNTER — Other Ambulatory Visit: Payer: Self-pay

## 2020-04-03 ENCOUNTER — Ambulatory Visit
Admission: RE | Admit: 2020-04-03 | Discharge: 2020-04-03 | Disposition: A | Discharge status: Home / Self Care | Payer: Medicare Other | Source: Ambulatory Visit | Attending: Neurology | Admitting: Neurology

## 2020-04-03 DIAGNOSIS — R200 Anesthesia of skin: Secondary | ICD-10-CM

## 2020-04-03 DIAGNOSIS — Z8669 Personal history of other diseases of the nervous system and sense organs: Secondary | ICD-10-CM

## 2020-04-03 DIAGNOSIS — M0579 Rheumatoid arthritis with rheumatoid factor of multiple sites without organ or systems involvement: Secondary | ICD-10-CM

## 2020-04-07 ENCOUNTER — Telehealth: Payer: Self-pay | Admitting: *Deleted

## 2020-04-07 NOTE — Telephone Encounter (Signed)
Called and spoke with pt about results per Dr. Garth Bigness note. She verbalized understanding.

## 2020-04-07 NOTE — Telephone Encounter (Signed)
-----   Message from Britt Bottom, MD sent at 04/07/2020  4:50 PM EST ----- Please let her know that the MRI of the cervical spine looked okay.  She had mild degenerative changes but nothing compressing  any of the nerve roots or the spinal.  The changes were actually fairly stable compared to 2006.

## 2020-04-23 DIAGNOSIS — M0519 Rheumatoid lung disease with rheumatoid arthritis of multiple sites: Secondary | ICD-10-CM | POA: Diagnosis not present

## 2020-05-14 DIAGNOSIS — Z23 Encounter for immunization: Secondary | ICD-10-CM | POA: Diagnosis not present

## 2020-05-21 ENCOUNTER — Other Ambulatory Visit: Payer: Self-pay | Admitting: *Deleted

## 2020-05-21 DIAGNOSIS — M0579 Rheumatoid arthritis with rheumatoid factor of multiple sites without organ or systems involvement: Secondary | ICD-10-CM | POA: Diagnosis not present

## 2020-05-21 DIAGNOSIS — Z79899 Other long term (current) drug therapy: Secondary | ICD-10-CM

## 2020-05-21 DIAGNOSIS — M0519 Rheumatoid lung disease with rheumatoid arthritis of multiple sites: Secondary | ICD-10-CM | POA: Diagnosis not present

## 2020-05-21 NOTE — Addendum Note (Signed)
Addended by: Inis Sizer D on: 05/21/2020 09:28 AM   Modules accepted: Orders

## 2020-05-22 ENCOUNTER — Telehealth: Payer: Self-pay | Admitting: *Deleted

## 2020-05-22 LAB — COMPREHENSIVE METABOLIC PANEL
ALT: 15 IU/L (ref 0–32)
AST: 16 IU/L (ref 0–40)
Albumin/Globulin Ratio: 2.3 — ABNORMAL HIGH (ref 1.2–2.2)
Albumin: 4.6 g/dL (ref 3.8–4.8)
Alkaline Phosphatase: 73 IU/L (ref 44–121)
BUN/Creatinine Ratio: 28 (ref 12–28)
BUN: 15 mg/dL (ref 8–27)
Bilirubin Total: 0.3 mg/dL (ref 0.0–1.2)
CO2: 22 mmol/L (ref 20–29)
Calcium: 9.2 mg/dL (ref 8.7–10.3)
Chloride: 104 mmol/L (ref 96–106)
Creatinine, Ser: 0.54 mg/dL — ABNORMAL LOW (ref 0.57–1.00)
Globulin, Total: 2 g/dL (ref 1.5–4.5)
Glucose: 87 mg/dL (ref 65–99)
Potassium: 4.3 mmol/L (ref 3.5–5.2)
Sodium: 143 mmol/L (ref 134–144)
Total Protein: 6.6 g/dL (ref 6.0–8.5)
eGFR: 100 mL/min/{1.73_m2} (ref >59)

## 2020-05-22 LAB — CBC WITH DIFFERENTIAL/PLATELET
Basophils Absolute: 0 10*3/uL (ref 0.0–0.2)
Basos: 1 %
EOS (ABSOLUTE): 0.3 10*3/uL (ref 0.0–0.4)
Eos: 5 %
Hematocrit: 39 % (ref 34.0–46.6)
Hemoglobin: 12.8 g/dL (ref 11.1–15.9)
Immature Grans (Abs): 0 10*3/uL (ref 0.0–0.1)
Immature Granulocytes: 0 %
Lymphocytes Absolute: 1.8 10*3/uL (ref 0.7–3.1)
Lymphs: 27 %
MCH: 28.5 pg (ref 26.6–33.0)
MCHC: 32.8 g/dL (ref 31.5–35.7)
MCV: 87 fL (ref 79–97)
Monocytes Absolute: 0.6 10*3/uL (ref 0.1–0.9)
Monocytes: 8 %
Neutrophils Absolute: 4 10*3/uL (ref 1.4–7.0)
Neutrophils: 59 %
Platelets: 303 10*3/uL (ref 150–450)
RBC: 4.49 x10E6/uL (ref 3.77–5.28)
RDW: 12.9 % (ref 11.7–15.4)
WBC: 6.8 10*3/uL (ref 3.4–10.8)

## 2020-05-22 NOTE — Telephone Encounter (Signed)
-----   Message from Britt Bottom, MD sent at 05/22/2020 10:43 AM EDT ----- Please let the patient know that the lab work is fine.

## 2020-05-22 NOTE — Telephone Encounter (Signed)
Called pt. Relayed results per Dr. Garth Bigness note. She verbalized understanding. Mailed copy per pt request.

## 2020-06-18 DIAGNOSIS — M0519 Rheumatoid lung disease with rheumatoid arthritis of multiple sites: Secondary | ICD-10-CM | POA: Diagnosis not present

## 2020-06-29 ENCOUNTER — Other Ambulatory Visit: Payer: Self-pay | Admitting: Neurology

## 2020-07-05 DIAGNOSIS — U071 COVID-19: Secondary | ICD-10-CM | POA: Diagnosis not present

## 2020-07-16 ENCOUNTER — Other Ambulatory Visit: Payer: Self-pay

## 2020-07-16 ENCOUNTER — Other Ambulatory Visit: Payer: Self-pay | Admitting: Neurology

## 2020-07-16 DIAGNOSIS — M0579 Rheumatoid arthritis with rheumatoid factor of multiple sites without organ or systems involvement: Secondary | ICD-10-CM

## 2020-07-16 DIAGNOSIS — M0519 Rheumatoid lung disease with rheumatoid arthritis of multiple sites: Secondary | ICD-10-CM | POA: Diagnosis not present

## 2020-07-16 DIAGNOSIS — Z79899 Other long term (current) drug therapy: Secondary | ICD-10-CM | POA: Diagnosis not present

## 2020-07-16 NOTE — Progress Notes (Signed)
Office Visit Note  [name]             Date of Birth: [birth year]           MRN: [id number]             PCP: Kelton Pillar, MD Referring: Kelton Pillar, MD Visit Date: 07/30/2020 Occupation: @GUAROCC @  Subjective:  Medication monitoring.   History of Present Illness: Kristy Perez is a 70 y.o. female with a history of seropositive rheumatoid arthritis and osteoarthritis.  She states her rheumatoid arthritis is well controlled with IV Orencia.  She has been getting infusions every month.  She denies any joint swelling.  She denies any neck or lower back pain currently.  She continues to have some discomfort in the right trochanteric bursa.  She has some stiffness in her hands but no joint swelling.  She continues to have dry mouth and dry eyes.  She relates shortness of breath to asthma.  Activities of Daily Living:  Patient reports morning stiffness for 0 minutes.   Patient Reports nocturnal pain.  Difficulty dressing/grooming: Denies Difficulty climbing stairs: Denies Difficulty getting out of chair: Denies Difficulty using hands for taps, buttons, cutlery, and/or writing: Reports  Review of Systems  Constitutional:  Positive for fatigue.  HENT:  Positive for mouth dryness and nose dryness.   Eyes:  Positive for dryness. Negative for pain and itching.  Respiratory:  Positive for shortness of breath and difficulty breathing.        Asthma  Cardiovascular:  Negative for chest pain and palpitations.  Gastrointestinal:  Negative for blood in stool, constipation and diarrhea.  Endocrine: Negative for increased urination.  Genitourinary:  Negative for difficulty urinating.  Musculoskeletal:  Positive for joint pain, joint pain and joint swelling. Negative for myalgias, morning stiffness, muscle tenderness and myalgias.  Skin:  Negative for color change, rash and redness.  Allergic/Immunologic: Negative for susceptible to infections.  Neurological:  Positive for  dizziness and numbness. Negative for headaches, memory loss and weakness.  Hematological:  Negative for bruising/bleeding tendency.  Psychiatric/Behavioral:  Negative for confusion.    PMFS History:  Patient Active Problem List   Diagnosis Date Noted   Numbness 03/31/2020   Mononeuropathy multiplex syndrome 03/31/2020   Neuritis of right ulnar nerve 03/31/2020   Polyneuropathy 03/31/2020   Hoarseness with classic voice fatigue 10/10/2018   S/P lumbar laminectomy 04/06/2018   Radiculopathy due to lumbar intervertebral disc disorder 12/07/2017   Congenital spondylolysis 12/07/2017   Spondylolisthesis of lumbar region 12/07/2017   DDD (degenerative disc disease), lumbar 12/08/2016   High risk medication use 03/15/2016   DJD (degenerative joint disease), cervical 03/15/2016   History of asthma 03/15/2016   History of squamous cell carcinoma 03/15/2016   History of basal cell carcinoma 03/15/2016   History of breast cancer 03/15/2016   History of Chiari malformation 03/15/2016   Vitamin D deficiency 03/15/2016   Rheumatoid arthritis with rheumatoid factor of multiple sites without organ or systems involvement ([place]) 10/20/2015   Esophageal reflux 05/01/2015   Anxiety 05/01/2015   Sjogrens syndrome ([place]) 05/01/2015   Chronic obstructive airway disease with asthma ([place]) 05/01/2015   Deviated nasal septum 05/24/2014    Class: Chronic   Insomnia 11/25/2007   DOE (dyspnea on exertion) 11/15/2007    Past Medical History:  Diagnosis Date   Anemia    Asthma    inhaler one x per day   Cancer ([place])    breast, left   Chiari  malformation    Difficult intubation    cannot hyperextend neck due to Chiari malformation with tonsillar involvement, RA   Dysrhythmia 2015   history of PAC's   GERD (gastroesophageal reflux disease)    High cholesterol    dx by PCP per patient    PONV (postoperative nausea and vomiting)    Rheumatoid arthritis ([place])    on Humira    Family History  Problem  Relation Age of Onset   Diabetes Mother    Heart failure Mother    Bladder Cancer Mother    Stroke Mother    Cancer Father        head and neck   Uterine cancer Maternal Aunt    Bladder Cancer Maternal Grandmother    Colon cancer Maternal Grandmother    Past Surgical History:  Procedure Laterality Date   BREAST SURGERY Left 2003   Lumpectomy T1C1, NO and negative ER/ PR   COLONOSCOPY  10/04   COLONOSCOPY  11/10   normal recheck in 5 years   COLPORRHAPHY     ESOPHAGOGASTRODUODENOSCOPY ENDOSCOPY  11/10   GERD   FOOT SURGERY Left age 47   removal of pseudo rheumatoid nodules from left forefoot.   LAPAROSCOPIC BILATERAL SALPINGO OOPHERECTOMY N/A 07/30/2013   Procedure: LAPAROSCOPIC BILATERAL SALPINGO OOPHORECTOMY WITH WASHINGS;  Surgeon: Lyman Speller, MD;  Location: [facility];  Service: Gynecology;  Laterality: N/A;   LUMBAR LAMINECTOMY/DECOMPRESSION MICRODISCECTOMY Right 04/06/2018   Procedure: Extraforaminal Microdiscectomy - Lumbar Two-Lumbar Three - right;  Surgeon: Eustace Folts, MD;  Location: [facility];  Service: Neurosurgery;  Laterality: Right;  Extraforaminal Microdiscectomy - Lumbar Two-Lumbar Three - right   NASAL SEPTOPLASTY W/ TURBINOPLASTY Bilateral 05/24/2014   Procedure: NASAL SEPTOPLASTY WITH  BILATERAL TURBINATE REDUCTION;  Surgeon: Jerrell Belfast, MD;  Location: [facility];  Service: ENT;  Laterality: Bilateral;   PELVIC FLOOR REPAIR  3/04   [place] with AP colporrhaphy   PILONIDAL CYST EXCISION     TOTAL VAGINAL HYSTERECTOMY  1989   with AP repair   Social History   Social History Narrative   Lives alone in a 2 story home. Has 2 children.  Works as a Radiation protection practitioner.  Education: college.    Immunization History  Administered Date(s) Administered   Fluad Quad(high Dose 65+) 10/14/2018   Influenza, High Dose Seasonal PF 10/26/2017   Influenza,inj,Quad PF,6+ Mos 10/18/2016   Influenza-Unspecified 10/08/2014, 10/24/2015   Janssen (J&J) SARS-COV-2 Vaccination 05/01/2019    PFIZER(Purple Top)SARS-COV-2 Vaccination 02/15/2020   Pneumococcal-Unspecified 10/18/2007   Tdap 11/24/2008   Zoster Recombinat (Shingrix) 12/25/2016     Objective: Vital Signs: BP 117/70 (BP Location: Left Arm, Patient Position: Sitting, Cuff Size: Normal)   Pulse 69   Ht 5' 1.5" (1.562 m)   Wt 125 lb (56.7 kg)   LMP 02/15/1985 (Approximate)   BMI 23.24 kg/m    Physical Exam Vitals and nursing note reviewed.  Constitutional:      Appearance: She is well-developed.  HENT:     Head: Normocephalic and atraumatic.  Eyes:     Conjunctiva/sclera: Conjunctivae normal.  Cardiovascular:     Rate and Rhythm: Normal rate and regular rhythm.     Heart sounds: Normal heart sounds.  Pulmonary:     Effort: Pulmonary effort is normal.     Breath sounds: Normal breath sounds.  Abdominal:     General: Bowel sounds are normal.     Palpations: Abdomen is soft.  Musculoskeletal:     Cervical back: Normal range  of motion.  Lymphadenopathy:     Cervical: No cervical adenopathy.  Skin:    General: Skin is warm and dry.     Capillary Refill: Capillary refill takes less than 2 seconds.  Neurological:     Mental Status: She is alert and oriented to person, place, and time.  Psychiatric:        Behavior: Behavior normal.     Musculoskeletal Exam: C-spine was in good range of motion.  She had no point tenderness over lumbar region.  She had lumbar spine fusion in the past.  Shoulder joints, elbow joints, wrist joints with good range of motion.  She had bilateral CMC prominence.  She has synovial thickening over MCP joints with ulnar deviation.  PIP and DIP thickening was noted.  No synovitis was noted.  Hip joints and knee joints with good range of motion.  She had MTP, PIP and DIP thickening with no synovitis.  CDAI Exam: CDAI Score: 0.2  Patient Global: 1 mm; Provider Global: 1 mm Swollen: 0 ; Tender: 0  Joint Exam 07/30/2020   No joint exam has been documented for this visit   There is  currently no information documented on the homunculus. Go to the Rheumatology activity and complete the homunculus joint exam.  Investigation: No additional findings.  Imaging: No results found.  Recent Labs: Lab Results  Component Value Date   WBC 6.3 07/16/2020   HGB 12.1 07/16/2020   PLT 335 07/16/2020   NA 140 07/16/2020   K 4.3 07/16/2020   CL 102 07/16/2020   CO2 21 07/16/2020   GLUCOSE 81 07/16/2020   BUN 11 07/16/2020   CREATININE 0.47 (L) 07/16/2020   BILITOT 0.4 07/16/2020   ALKPHOS 71 07/16/2020   AST 17 07/16/2020   ALT 17 07/16/2020   PROT 6.1 07/16/2020   ALBUMIN 4.6 07/16/2020   CALCIUM 8.9 07/16/2020   GFRAA 114 03/31/2020   QFTBGOLD Negative 06/15/2016   QFTBGOLDPLUS NEGATIVE 02/26/2020    Speciality Comments: Orencia IV 500mg  every 4 weeks, patient infuses at [facility] Tb gold negative 06/15/16  Procedures:  No procedures performed Allergies: Fish allergy, Iodine, Boniva [ibandronic acid], Erythromycin, Fosamax [alendronate], and Sulfonamide derivatives   Assessment / Plan:     Visit Diagnoses: Rheumatoid arthritis with rheumatoid factor of multiple sites without organ or systems involvement ([place]) - +RF, +CCP:  -Patient had no synovitis on examination today.  She has been tolerating Orencia IV infusions well.  Her symptoms are well controlled.  Although she continues to have some stiffness in her hands and feet.  She has underlying osteoarthritis and ulnar deviation in her MCPs from rheumatoid arthritis.  I will obtain x-rays to monitor disease process.  Plan: XR Hand 2 View Right, XR Hand 2 View Left, XR Foot 2 Views Right, XR Foot 2 Views Left.  X-rays of bilateral hands and feet were reviewed with the patient.  No radiographic progression was noted.  Erosive changes were noted in bilateral feet.  High risk medication use - Orencia IV infusion 500 mg every 28 days at [facility].  (inadequate response to Enbrel and Humira).  Her labs from July 16, 2020 CBC with  differential and CMP with GFR were normal.  TB gold was negative on February 26, 2020.  Trochanteric bursitis of right hip -she has off-and-on discomfort  DDD (degenerative disc disease), cervical-  DDD (degenerative disc disease), lumbar - S/p lumbar laminectomy on 04/06/18 by Dr. Ronnald Ramp.  She denies any lower back pain currently.  Age-related osteoporosis without current pathological fracture - DEXA 02/12/2019: Right femoral neck BMD 0.552 with T score -2.7. She declined treatment for osteoporosis.  She will get repeat DEXA scan in January 2023.  Vitamin D deficiency-diet rich in calcium and vitamin D supplement was discussed.  Primary insomnia - restoril 30 mg at bedtime for insomnia.   History of Chiari malformation-followed by Dr. Felecia Shelling..  Gastroesophageal reflux disease without esophagitis  History of asthma  History of basal cell carcinoma  History of breast cancer  History of squamous cell carcinoma  Orders: Orders Placed This Encounter  Procedures   XR Hand 2 View Right   XR Hand 2 View Left   XR Foot 2 Views Right   XR Foot 2 Views Left    No orders of the defined types were placed in this encounter.   Follow-Up Instructions: Return in about 5 months (around 12/30/2020) for Rheumatoid arthritis, Osteoporosis.   Bo Merino, MD  Note - This record has been created using Editor, commissioning.  Chart creation errors have been sought, but may not always  have been located. Such creation errors do not reflect on  the standard of medical care.

## 2020-07-17 LAB — CBC WITH DIFFERENTIAL/PLATELET
Basophils Absolute: 0 10*3/uL (ref 0.0–0.2)
Basos: 1 %
EOS (ABSOLUTE): 0.3 10*3/uL (ref 0.0–0.4)
Eos: 4 %
Hematocrit: 36.4 % (ref 34.0–46.6)
Hemoglobin: 12.1 g/dL (ref 11.1–15.9)
Immature Grans (Abs): 0 10*3/uL (ref 0.0–0.1)
Immature Granulocytes: 0 %
Lymphocytes Absolute: 1.7 10*3/uL (ref 0.7–3.1)
Lymphs: 28 %
MCH: 28.5 pg (ref 26.6–33.0)
MCHC: 33.2 g/dL (ref 31.5–35.7)
MCV: 86 fL (ref 79–97)
Monocytes Absolute: 0.6 10*3/uL (ref 0.1–0.9)
Monocytes: 9 %
Neutrophils Absolute: 3.7 10*3/uL (ref 1.4–7.0)
Neutrophils: 58 %
Platelets: 335 10*3/uL (ref 150–450)
RBC: 4.25 x10E6/uL (ref 3.77–5.28)
RDW: 13.1 % (ref 11.7–15.4)
WBC: 6.3 10*3/uL (ref 3.4–10.8)

## 2020-07-17 LAB — COMPREHENSIVE METABOLIC PANEL
ALT: 17 IU/L (ref 0–32)
AST: 17 IU/L (ref 0–40)
Albumin/Globulin Ratio: 3.1 — ABNORMAL HIGH (ref 1.2–2.2)
Albumin: 4.6 g/dL (ref 3.8–4.8)
Alkaline Phosphatase: 71 IU/L (ref 44–121)
BUN/Creatinine Ratio: 23 (ref 12–28)
BUN: 11 mg/dL (ref 8–27)
Bilirubin Total: 0.4 mg/dL (ref 0.0–1.2)
CO2: 21 mmol/L (ref 20–29)
Calcium: 8.9 mg/dL (ref 8.7–10.3)
Chloride: 102 mmol/L (ref 96–106)
Creatinine, Ser: 0.47 mg/dL — ABNORMAL LOW (ref 0.57–1.00)
Globulin, Total: 1.5 g/dL (ref 1.5–4.5)
Glucose: 81 mg/dL (ref 65–99)
Potassium: 4.3 mmol/L (ref 3.5–5.2)
Sodium: 140 mmol/L (ref 134–144)
Total Protein: 6.1 g/dL (ref 6.0–8.5)
eGFR: 104 mL/min/{1.73_m2} (ref >59)

## 2020-07-22 DIAGNOSIS — E7800 Pure hypercholesterolemia, unspecified: Secondary | ICD-10-CM | POA: Diagnosis not present

## 2020-07-22 DIAGNOSIS — R7302 Impaired glucose tolerance (oral): Secondary | ICD-10-CM | POA: Diagnosis not present

## 2020-07-30 ENCOUNTER — Ambulatory Visit: Payer: Self-pay

## 2020-07-30 ENCOUNTER — Encounter: Payer: Self-pay | Admitting: Rheumatology

## 2020-07-30 ENCOUNTER — Other Ambulatory Visit: Payer: Self-pay

## 2020-07-30 ENCOUNTER — Ambulatory Visit (INDEPENDENT_AMBULATORY_CARE_PROVIDER_SITE_OTHER): Payer: Medicare Other | Admitting: Rheumatology

## 2020-07-30 VITALS — BP 117/70 | HR 69 | Ht 61.5 in | Wt 125.0 lb

## 2020-07-30 DIAGNOSIS — Z79899 Other long term (current) drug therapy: Secondary | ICD-10-CM | POA: Diagnosis not present

## 2020-07-30 DIAGNOSIS — M0579 Rheumatoid arthritis with rheumatoid factor of multiple sites without organ or systems involvement: Secondary | ICD-10-CM | POA: Diagnosis not present

## 2020-07-30 DIAGNOSIS — M5030 Other cervical disc degeneration, unspecified cervical region: Secondary | ICD-10-CM | POA: Diagnosis not present

## 2020-07-30 DIAGNOSIS — K219 Gastro-esophageal reflux disease without esophagitis: Secondary | ICD-10-CM

## 2020-07-30 DIAGNOSIS — Z8589 Personal history of malignant neoplasm of other organs and systems: Secondary | ICD-10-CM

## 2020-07-30 DIAGNOSIS — E559 Vitamin D deficiency, unspecified: Secondary | ICD-10-CM | POA: Diagnosis not present

## 2020-07-30 DIAGNOSIS — M5136 Other intervertebral disc degeneration, lumbar region: Secondary | ICD-10-CM

## 2020-07-30 DIAGNOSIS — M79642 Pain in left hand: Secondary | ICD-10-CM

## 2020-07-30 DIAGNOSIS — M810 Age-related osteoporosis without current pathological fracture: Secondary | ICD-10-CM | POA: Diagnosis not present

## 2020-07-30 DIAGNOSIS — M79671 Pain in right foot: Secondary | ICD-10-CM | POA: Diagnosis not present

## 2020-07-30 DIAGNOSIS — F5101 Primary insomnia: Secondary | ICD-10-CM | POA: Diagnosis not present

## 2020-07-30 DIAGNOSIS — M79672 Pain in left foot: Secondary | ICD-10-CM

## 2020-07-30 DIAGNOSIS — Z85828 Personal history of other malignant neoplasm of skin: Secondary | ICD-10-CM | POA: Diagnosis not present

## 2020-07-30 DIAGNOSIS — M79641 Pain in right hand: Secondary | ICD-10-CM | POA: Diagnosis not present

## 2020-07-30 DIAGNOSIS — M7061 Trochanteric bursitis, right hip: Secondary | ICD-10-CM

## 2020-07-30 DIAGNOSIS — Z8709 Personal history of other diseases of the respiratory system: Secondary | ICD-10-CM

## 2020-07-30 DIAGNOSIS — Z8669 Personal history of other diseases of the nervous system and sense organs: Secondary | ICD-10-CM | POA: Diagnosis not present

## 2020-07-30 DIAGNOSIS — Z853 Personal history of malignant neoplasm of breast: Secondary | ICD-10-CM

## 2020-07-30 NOTE — Patient Instructions (Signed)
COVID-19 vaccine recommendations:   COVID-19 vaccine is recommended for everyone (unless you are allergic to a vaccine component), even if you are on a medication that suppresses your immune system.    If you are on Orencia IV infusions- time vaccination administration so that the first COVID-19 vaccination will occur four weeks after the infusion and postpone the subsequent infusion by one week.     Do not take Tylenol or any anti-inflammatory medications (NSAIDs) 24 hours prior to the COVID-19 vaccination.   There is no direct evidence about the efficacy of the COVID-19 vaccine in individuals who are on medications that suppress the immune system.   Even if you are fully vaccinated, and you are on any medications that suppress your immune system, please continue to wear a mask, maintain at least six feet social distance and practice hand hygiene.   If you develop a COVID-19 infection, please contact your PCP or our office to determine if you need monoclonal antibody infusion.  The booster vaccine is now available for immunocompromised patients.   Please see the following web sites for updated information.   [email or web address]-[email or web address]   If you test POSITIVE for COVID19 and have MILD to MODERATE symptoms: First, call your PCP if you would like to receive COVID19 treatment AND Hold your medications during the infection and for at least 1 week after your symptoms have resolved: Injectable medication (Benlysta, Cimzia, Cosentyx, Enbrel, Humira, Orencia, Remicade, Simponi, Stelara, Taltz, Tremfya) Methotrexate Leflunomide (Arava) Mycophenolate (Cellcept) Morrie Sheldon, Olumiant, or Rinvoq If you take Actemra or Kevzara, you DO NOT need to hold these for COVID19 infection.  If you test POSITIVE for COVID19 and have NO symptoms: First, call your PCP if you would like to receive COVID19 treatment AND Hold your medications for at least  10 days after the day that you tested positive Injectable medication (Benlysta, Cimzia, Cosentyx, Enbrel, Humira, Orencia, Remicade, Simponi, Stelara, Taltz, Tremfya) Methotrexate Leflunomide (Arava) Mycophenolate (Cellcept) Morrie Sheldon, Olumiant, or Rinvoq If you take Actemra or Kevzara, you DO NOT need to hold these for COVID19 infection.  If you have signs or symptoms of an infection or start antibiotics: First, call your PCP for workup of your infection. Hold your medication through the infection, until you complete your antibiotics, and until symptoms resolve if you take the following: Injectable medication (Actemra, Benlysta, Cimzia, Cosentyx, Enbrel, Humira, Kevzara, Orencia, Remicade, Simponi, Stelara, Taltz, Tremfya) Methotrexate Leflunomide (Arava) Mycophenolate (Cellcept) Morrie Sheldon, Olumiant, or Rinvoq  Vaccines You are taking a medication(s) that can suppress your immune system.  The following immunizations are recommended: Flu annually Covid-19  Td/Tdap (tetanus, diphtheria, pertussis) every 10 years Pneumonia (Prevnar 15 then Pneumovax 23 at least 1 year apart.  Alternatively, can take Prevnar 20 without needing additional dose) Shingrix (after age 69): 2 doses from 4 weeks to 6 months apart  Please check with your PCP to make sure you are up to date.  Heart Disease Prevention   Your inflammatory disease increases your risk of heart disease which includes heart attack, stroke, atrial fibrillation (irregular heartbeats), high blood pressure, heart failure and atherosclerosis (plaque in the arteries).  It is important to reduce your risk by:   Keep blood pressure, cholesterol, and blood sugar at healthy levels   Smoking Cessation   Maintain a healthy weight  BMI 20-25   Eat a healthy diet  Plenty of fresh fruit, vegetables, and whole grains  Limit saturated fats, foods high in sodium, and added sugars  DASH and Mediterranean diet   Increase  physical activity  Recommend  moderate physically activity for 150 minutes per week/ 30 minutes a day for five days a week These can be broken up into three separate ten-minute sessions during the day.   Reduce Stress  Meditation, slow breathing exercises, yoga, coloring books  Dental visits twice a year

## 2020-08-13 DIAGNOSIS — M0519 Rheumatoid lung disease with rheumatoid arthritis of multiple sites: Secondary | ICD-10-CM | POA: Diagnosis not present

## 2020-09-03 DIAGNOSIS — Z23 Encounter for immunization: Secondary | ICD-10-CM | POA: Diagnosis not present

## 2020-09-17 ENCOUNTER — Other Ambulatory Visit (INDEPENDENT_AMBULATORY_CARE_PROVIDER_SITE_OTHER): Payer: Medicare Other

## 2020-09-17 DIAGNOSIS — M0579 Rheumatoid arthritis with rheumatoid factor of multiple sites without organ or systems involvement: Secondary | ICD-10-CM | POA: Diagnosis not present

## 2020-09-17 DIAGNOSIS — Z0289 Encounter for other administrative examinations: Secondary | ICD-10-CM

## 2020-09-17 DIAGNOSIS — Z79899 Other long term (current) drug therapy: Secondary | ICD-10-CM

## 2020-09-17 DIAGNOSIS — M0519 Rheumatoid lung disease with rheumatoid arthritis of multiple sites: Secondary | ICD-10-CM | POA: Diagnosis not present

## 2020-09-18 ENCOUNTER — Telehealth: Payer: Self-pay | Admitting: *Deleted

## 2020-09-18 LAB — COMPREHENSIVE METABOLIC PANEL
ALT: 16 IU/L (ref 0–32)
AST: 20 IU/L (ref 0–40)
Albumin/Globulin Ratio: 2.9 — ABNORMAL HIGH (ref 1.2–2.2)
Albumin: 4.7 g/dL (ref 3.8–4.8)
Alkaline Phosphatase: 71 IU/L (ref 44–121)
BUN/Creatinine Ratio: 24 (ref 12–28)
BUN: 13 mg/dL (ref 8–27)
Bilirubin Total: 0.4 mg/dL (ref 0.0–1.2)
CO2: 22 mmol/L (ref 20–29)
Calcium: 8.9 mg/dL (ref 8.7–10.3)
Chloride: 99 mmol/L (ref 96–106)
Creatinine, Ser: 0.55 mg/dL — ABNORMAL LOW (ref 0.57–1.00)
Globulin, Total: 1.6 g/dL (ref 1.5–4.5)
Glucose: 84 mg/dL (ref 65–99)
Potassium: 4 mmol/L (ref 3.5–5.2)
Sodium: 136 mmol/L (ref 134–144)
Total Protein: 6.3 g/dL (ref 6.0–8.5)
eGFR: 100 mL/min/{1.73_m2} (ref >59)

## 2020-09-18 LAB — CBC WITH DIFFERENTIAL/PLATELET
Basophils Absolute: 0 10*3/uL (ref 0.0–0.2)
Basos: 1 %
EOS (ABSOLUTE): 0.3 10*3/uL (ref 0.0–0.4)
Eos: 4 %
Hematocrit: 37.9 % (ref 34.0–46.6)
Hemoglobin: 12.9 g/dL (ref 11.1–15.9)
Immature Grans (Abs): 0 10*3/uL (ref 0.0–0.1)
Immature Granulocytes: 0 %
Lymphocytes Absolute: 1.9 10*3/uL (ref 0.7–3.1)
Lymphs: 25 %
MCH: 28.5 pg (ref 26.6–33.0)
MCHC: 34 g/dL (ref 31.5–35.7)
MCV: 84 fL (ref 79–97)
Monocytes Absolute: 0.7 10*3/uL (ref 0.1–0.9)
Monocytes: 9 %
Neutrophils Absolute: 4.6 10*3/uL (ref 1.4–7.0)
Neutrophils: 61 %
Platelets: 366 10*3/uL (ref 150–450)
RBC: 4.52 x10E6/uL (ref 3.77–5.28)
RDW: 12.9 % (ref 11.7–15.4)
WBC: 7.5 10*3/uL (ref 3.4–10.8)

## 2020-09-18 NOTE — Telephone Encounter (Signed)
Called and spoke w/ pt about results per Dr. Garth Bigness note. She verbalized understanding.

## 2020-09-18 NOTE — Telephone Encounter (Signed)
-----   Message from Britt Bottom, MD sent at 09/18/2020 12:37 PM EDT ----- Please let the patient know that the lab work is fine.

## 2020-10-15 DIAGNOSIS — M0519 Rheumatoid lung disease with rheumatoid arthritis of multiple sites: Secondary | ICD-10-CM | POA: Diagnosis not present

## 2020-11-10 DIAGNOSIS — D225 Melanocytic nevi of trunk: Secondary | ICD-10-CM | POA: Diagnosis not present

## 2020-11-10 DIAGNOSIS — L820 Inflamed seborrheic keratosis: Secondary | ICD-10-CM | POA: Diagnosis not present

## 2020-11-10 DIAGNOSIS — L578 Other skin changes due to chronic exposure to nonionizing radiation: Secondary | ICD-10-CM | POA: Diagnosis not present

## 2020-11-10 DIAGNOSIS — L821 Other seborrheic keratosis: Secondary | ICD-10-CM | POA: Diagnosis not present

## 2020-11-10 DIAGNOSIS — L814 Other melanin hyperpigmentation: Secondary | ICD-10-CM | POA: Diagnosis not present

## 2020-11-10 DIAGNOSIS — L570 Actinic keratosis: Secondary | ICD-10-CM | POA: Diagnosis not present

## 2020-11-10 DIAGNOSIS — Z85828 Personal history of other malignant neoplasm of skin: Secondary | ICD-10-CM | POA: Diagnosis not present

## 2020-11-12 ENCOUNTER — Other Ambulatory Visit: Payer: Self-pay | Admitting: *Deleted

## 2020-11-12 ENCOUNTER — Other Ambulatory Visit (INDEPENDENT_AMBULATORY_CARE_PROVIDER_SITE_OTHER): Payer: Self-pay

## 2020-11-12 DIAGNOSIS — Z79899 Other long term (current) drug therapy: Secondary | ICD-10-CM | POA: Diagnosis not present

## 2020-11-12 DIAGNOSIS — Z0289 Encounter for other administrative examinations: Secondary | ICD-10-CM

## 2020-11-12 DIAGNOSIS — M0519 Rheumatoid lung disease with rheumatoid arthritis of multiple sites: Secondary | ICD-10-CM | POA: Diagnosis not present

## 2020-11-13 LAB — COMPREHENSIVE METABOLIC PANEL
ALT: 16 IU/L (ref 0–32)
AST: 18 IU/L (ref 0–40)
Albumin/Globulin Ratio: 2.2 (ref 1.2–2.2)
Albumin: 4.3 g/dL (ref 3.8–4.8)
Alkaline Phosphatase: 69 IU/L (ref 44–121)
BUN/Creatinine Ratio: 26 (ref 12–28)
BUN: 13 mg/dL (ref 8–27)
Bilirubin Total: 0.4 mg/dL (ref 0.0–1.2)
CO2: 21 mmol/L (ref 20–29)
Calcium: 9.1 mg/dL (ref 8.7–10.3)
Chloride: 104 mmol/L (ref 96–106)
Creatinine, Ser: 0.5 mg/dL — ABNORMAL LOW (ref 0.57–1.00)
Globulin, Total: 2 g/dL (ref 1.5–4.5)
Glucose: 137 mg/dL — ABNORMAL HIGH (ref 70–99)
Potassium: 4.1 mmol/L (ref 3.5–5.2)
Sodium: 141 mmol/L (ref 134–144)
Total Protein: 6.3 g/dL (ref 6.0–8.5)
eGFR: 101 mL/min/{1.73_m2} (ref >59)

## 2020-11-13 LAB — CBC WITH DIFFERENTIAL/PLATELET
Basophils Absolute: 0 10*3/uL (ref 0.0–0.2)
Basos: 1 %
EOS (ABSOLUTE): 0.5 10*3/uL — ABNORMAL HIGH (ref 0.0–0.4)
Eos: 7 %
Hematocrit: 37.7 % (ref 34.0–46.6)
Hemoglobin: 12.5 g/dL (ref 11.1–15.9)
Immature Grans (Abs): 0 10*3/uL (ref 0.0–0.1)
Immature Granulocytes: 0 %
Lymphocytes Absolute: 1.9 10*3/uL (ref 0.7–3.1)
Lymphs: 27 %
MCH: 28.4 pg (ref 26.6–33.0)
MCHC: 33.2 g/dL (ref 31.5–35.7)
MCV: 86 fL (ref 79–97)
Monocytes Absolute: 0.6 10*3/uL (ref 0.1–0.9)
Monocytes: 8 %
Neutrophils Absolute: 4 10*3/uL (ref 1.4–7.0)
Neutrophils: 57 %
Platelets: 354 10*3/uL (ref 150–450)
RBC: 4.4 x10E6/uL (ref 3.77–5.28)
RDW: 13.1 % (ref 11.7–15.4)
WBC: 7.1 10*3/uL (ref 3.4–10.8)

## 2020-11-17 ENCOUNTER — Telehealth: Payer: Self-pay | Admitting: Neurology

## 2020-11-17 NOTE — Telephone Encounter (Signed)
-----   Message from Britt Bottom, MD sent at 11/13/2020  5:03 PM EDT ----- Please let the patient know that the lab work is fine.

## 2020-11-17 NOTE — Telephone Encounter (Signed)
Called the pt to review the lab results. There was no answer. LVM advising the the labs were normal and Dr.Sater didn't see anything that appeared concerning. Instructed the patient to call back with any questions.

## 2020-12-03 DIAGNOSIS — Z23 Encounter for immunization: Secondary | ICD-10-CM | POA: Diagnosis not present

## 2020-12-10 DIAGNOSIS — M0519 Rheumatoid lung disease with rheumatoid arthritis of multiple sites: Secondary | ICD-10-CM | POA: Diagnosis not present

## 2020-12-17 NOTE — Progress Notes (Signed)
Office Visit Note  [name]             Date of Birth: 1950-12-03           MRN: [id number]             PCP: Kelton Pillar, MD Referring: Kelton Pillar, MD Visit Date: 12/31/2020 Occupation: @GUAROCC @  Subjective:  Pain in both feet  History of Present Illness: DELORA GRAVATT is a 70 y.o. female with history of seropositive rheumatoid arthritis, DDD, and osteoporosis.  Patient is on Orencia 500 mg  IV infusions every 28 days performed at [facility]. She is due for her next infusion next week.  She states that prior to her monthly infusions she typically experiences increased nocturnal pain for 1 night within the week leading up to her next infusion.  She states last night she had difficulty sleeping due to severity of pain in both feet.  She states that the pain has improved this morning.  She denies any joint swelling.  She states that about 2 months ago she noticed swelling and warmth in the right wrist which resolved within 2 days.  She took ibuprofen as needed during that time which alleviated her symptoms.  She denies any other joint pain or joint swelling related to rheumatoid arthritis.  She continues to have chronic pain in her lower back.  She denies any recent falls or fractures.  She has been taking a vitamin D supplement daily and obtains calcium through her diet. She denies any recent infections.      Activities of Daily Living:  Patient reports morning stiffness for all day. Patient Reports nocturnal pain.  Difficulty dressing/grooming: Denies Difficulty climbing stairs: Reports Difficulty getting out of chair: Reports Difficulty using hands for taps, buttons, cutlery, and/or writing: Reports  Review of Systems  Constitutional:  Positive for fatigue.  HENT:  Positive for mouth dryness and nose dryness. Negative for mouth sores.   Eyes:  Positive for dryness. Negative for pain and itching.  Respiratory:  Positive for shortness of breath. Negative for  difficulty breathing.   Cardiovascular:  Negative for chest pain and palpitations.  Gastrointestinal:  Negative for blood in stool, constipation and diarrhea.  Endocrine: Negative for increased urination.  Genitourinary:  Negative for difficulty urinating.  Musculoskeletal:  Positive for myalgias, morning stiffness, muscle tenderness and myalgias. Negative for joint pain, joint pain and joint swelling.  Skin:  Negative for color change, rash and redness.  Allergic/Immunologic: Negative for susceptible to infections.  Neurological:  Positive for numbness. Negative for dizziness, headaches and memory loss.  Hematological:  Negative for bruising/bleeding tendency.  Psychiatric/Behavioral:  Negative for confusion.    PMFS History:  Patient Active Problem List   Diagnosis Date Noted   Numbness 03/31/2020   Mononeuropathy multiplex syndrome 03/31/2020   Neuritis of right ulnar nerve 03/31/2020   Polyneuropathy 03/31/2020   Hoarseness with classic voice fatigue 10/10/2018   S/P lumbar laminectomy 04/06/2018   Radiculopathy due to lumbar intervertebral disc disorder 12/07/2017   Congenital spondylolysis 12/07/2017   Spondylolisthesis of lumbar region 12/07/2017   DDD (degenerative disc disease), lumbar 12/08/2016   High risk medication use 03/15/2016   DJD (degenerative joint disease), cervical 03/15/2016   History of asthma 03/15/2016   History of squamous cell carcinoma 03/15/2016   History of basal cell carcinoma 03/15/2016   History of breast cancer 03/15/2016   History of Chiari malformation 03/15/2016   Vitamin D deficiency 03/15/2016   Rheumatoid arthritis with  rheumatoid factor of multiple sites without organ or systems involvement ([place]) 10/20/2015   Esophageal reflux 05/01/2015   Anxiety 05/01/2015   Sjogrens syndrome ([place]) 05/01/2015   Chronic obstructive airway disease with asthma ([place]) 05/01/2015   Deviated nasal septum 05/24/2014    Class: Chronic   Insomnia 11/25/2007    DOE (dyspnea on exertion) 11/15/2007    Past Medical History:  Diagnosis Date   Anemia    Asthma    inhaler one x per day   Cancer ([place])    breast, left   Chiari malformation    Difficult intubation    cannot hyperextend neck due to Chiari malformation with tonsillar involvement, RA   Dysrhythmia 2015   history of PAC's   GERD (gastroesophageal reflux disease)    High cholesterol    dx by PCP per patient    PONV (postoperative nausea and vomiting)    Rheumatoid arthritis ([place])    on Humira    Family History  Problem Relation Age of Onset   Diabetes Mother    Heart failure Mother    Bladder Cancer Mother    Stroke Mother    Cancer Father        head and neck   Uterine cancer Maternal Aunt    Bladder Cancer Maternal Grandmother    Colon cancer Maternal Grandmother    Past Surgical History:  Procedure Laterality Date   BREAST SURGERY Left 2003   Lumpectomy T1C1, NO and negative ER/ PR   COLONOSCOPY  10/04   COLONOSCOPY  11/10   normal recheck in 5 years   COLPORRHAPHY     ESOPHAGOGASTRODUODENOSCOPY ENDOSCOPY  11/10   GERD   FOOT SURGERY Left age 4   removal of pseudo rheumatoid nodules from left forefoot.   LAPAROSCOPIC BILATERAL SALPINGO OOPHERECTOMY N/A 07/30/2013   Procedure: LAPAROSCOPIC BILATERAL SALPINGO OOPHORECTOMY WITH WASHINGS;  Surgeon: Lyman Speller, MD;  Location: [facility];  Service: Gynecology;  Laterality: N/A;   LUMBAR LAMINECTOMY/DECOMPRESSION MICRODISCECTOMY Right 04/06/2018   Procedure: Extraforaminal Microdiscectomy - Lumbar Two-Lumbar Three - right;  Surgeon: Eustace Estis, MD;  Location: [facility];  Service: Neurosurgery;  Laterality: Right;  Extraforaminal Microdiscectomy - Lumbar Two-Lumbar Three - right   NASAL SEPTOPLASTY W/ TURBINOPLASTY Bilateral 05/24/2014   Procedure: NASAL SEPTOPLASTY WITH  BILATERAL TURBINATE REDUCTION;  Surgeon: Jerrell Belfast, MD;  Location: [facility];  Service: ENT;  Laterality: Bilateral;   PELVIC FLOOR REPAIR  3/04    [place] with AP colporrhaphy   PILONIDAL CYST EXCISION     TOTAL VAGINAL HYSTERECTOMY  1989   with AP repair   Social History   Social History Narrative   Lives alone in a 2 story home. Has 2 children.  Works as a Radiation protection practitioner.  Education: college.    Immunization History  Administered Date(s) Administered   Fluad Quad(high Dose 65+) 10/14/2018   Influenza, High Dose Seasonal PF 10/26/2017   Influenza,inj,Quad PF,6+ Mos 10/18/2016   Influenza-Unspecified 10/08/2014, 10/24/2015   Janssen (J&J) SARS-COV-2 Vaccination 05/01/2019   PFIZER(Purple Top)SARS-COV-2 Vaccination 02/15/2020, 09/03/2020   Pneumococcal-Unspecified 10/18/2007   Tdap 11/24/2008   Zoster Recombinat (Shingrix) 12/25/2016     Objective: Vital Signs: BP 111/69 (BP Location: Left Arm, Patient Position: Sitting, Cuff Size: Normal)   Pulse 76   Ht 5' 1.5" (1.562 m)   Wt 127 lb 6.4 oz (57.8 kg)   LMP 02/15/1985 (Approximate)   BMI 23.68 kg/m    Physical Exam Vitals and nursing note reviewed.  Constitutional:  Appearance: She is well-developed.  HENT:     Head: Normocephalic and atraumatic.  Eyes:     Conjunctiva/sclera: Conjunctivae normal.  Pulmonary:     Effort: Pulmonary effort is normal.  Abdominal:     Palpations: Abdomen is soft.  Musculoskeletal:     Cervical back: Normal range of motion.  Skin:    General: Skin is warm and dry.     Capillary Refill: Capillary refill takes less than 2 seconds.  Neurological:     Mental Status: She is alert and oriented to person, place, and time.  Psychiatric:        Behavior: Behavior normal.     Musculoskeletal Exam: C-spine has good range of motion with no discomfort.  Painful range of motion of the lumbar spine.  Midline spinal tenderness as well as tenderness over the right SI joint noted.  Shoulder joints and elbow joints have good range of motion with no discomfort.  Wrist joints have good range of motion with no tenderness or synovitis.  No flexor  extensor tenosynovitis noted.  CMC joint prominence noted bilaterally.  PIP and DIP thickening consistent with osteoarthritis of both hands.  Ulnar deviation in the right hand noted.  No tenderness or synovitis over MCP joints.  Hip joints have good range of motion with no discomfort.  Knee joints have good range of motion with no warmth or effusion.  Ankle joints have good range of motion with no tenderness or joint swelling.  No tenderness over MTP joints.  Hammertoes noted bilaterally. Bunions bilaterally.   CDAI Exam: CDAI Score: 0.6  Patient Global: 3 mm; Provider Global: 3 mm Swollen: 0 ; Tender: 0  Joint Exam 12/31/2020   No joint exam has been documented for this visit   There is currently no information documented on the homunculus. Go to the Rheumatology activity and complete the homunculus joint exam.  Investigation: No additional findings.  Imaging: No results found.  Recent Labs: Lab Results  Component Value Date   WBC 7.1 11/12/2020   HGB 12.5 11/12/2020   PLT 354 11/12/2020   NA 141 11/12/2020   K 4.1 11/12/2020   CL 104 11/12/2020   CO2 21 11/12/2020   GLUCOSE 137 (H) 11/12/2020   BUN 13 11/12/2020   CREATININE 0.50 (L) 11/12/2020   BILITOT 0.4 11/12/2020   ALKPHOS 69 11/12/2020   AST 18 11/12/2020   ALT 16 11/12/2020   PROT 6.3 11/12/2020   ALBUMIN 4.3 11/12/2020   CALCIUM 9.1 11/12/2020   GFRAA 114 03/31/2020   QFTBGOLD Negative 06/15/2016   QFTBGOLDPLUS NEGATIVE 02/26/2020    Speciality Comments: Orencia IV 500mg  every 4 weeks, patient infuses at [facility] Tb gold negative 06/15/16  Procedures:  No procedures performed Allergies: Fish allergy, Iodine, Boniva [ibandronic acid], Erythromycin, Fosamax [alendronate], and Sulfonamide derivatives         Assessment / Plan:     Visit Diagnoses: Rheumatoid arthritis with rheumatoid factor of multiple sites without organ or systems involvement (HCC) -  +RF, +CCP: She has no synovitis on examination today.   She has clinically been doing well on Orencia 500 mg IV infusions every 28 days.  She is due for her infusion next week.  Typically leading up to her monthly infusions she experiences 1 night of nocturnal pain about a week prior to her infusion.  Last night she had difficulty sleeping due to severity of pain in both feet but has no inflammation or tenderness on examination today.  About 2 months  ago she had an episode of right extensor tenosynovitis which resolved within 2 days.  She has not had any other signs or symptoms of a flare.  She continues to find Orencia to be effective at managing her rheumatoid arthritis and does not want to make any medication changes.  X-rays of both hands and feet were updated on 07/30/2020 and did not reveal any radiographic progression when compared to x-rays from 2017.  She will remain on Orencia as prescribed.  She was advised to notify us if she develops signs or symptoms of more frequent flares.  She will follow-up in the office in 5 months.  High risk medication use - Orencia IV infusion 500 mg every 28 days at [facility].  (inadequate response to Enbrel and Humira).  CBC and CMP were drawn on 11/12/2020.  She will be having labs drawn with her infusion next week.  TB Gold negative on 02/26/2020.  Future order for TB gold will be placed today. - Plan: QuantiFERON-TB Gold Plus She has not had any recent infections.  Discussed the importance of postponing Orencia infusions if she develops signs or symptoms of an infection and to resume once the infection has completely cleared.  She voiced understanding.  Trochanteric bursitis of right hip: Tenderness over the right trochanter bursa was noted.  Discussed the importance of performing stretching exercises on a daily basis.  DDD (degenerative disc disease), cervical: MRI C-spine 04/03/2020 results were reviewed with him today in the office.  She has mild multilevel degenerative changes.  No significant progression compared to 09/22/2004.   She has good range of motion of the C-spine with no discomfort on examination today.  DDD (degenerative disc disease), lumbar: Chronic pain.  S/p lumbar laminectomy on 04/06/18 by Dr. Ronnald Ramp. She gets a massage on a regular basis.  Age-related osteoporosis without current pathological fracture - DEXA 02/12/2019: Right femoral neck BMD 0.552 with T score -2.7. She declined treatment for osteoporosis.  She is taking vitamin D 2000 units daily and obtains calcium thorough her diet. Due to update DEXA at the end of December 2022/early January 2023.  Order placed today. - Plan: DG BONE DENSITY (DXA)  Vitamin D deficiency -She is taking vitamin D 2000 units daily.  Plan: DG BONE DENSITY (DXA)  Primary insomnia: She takes restoril 30 mg at bedtime for insomnia.   Other medical conditions are listed as follows:   History of Chiari malformation  Gastroesophageal reflux disease without esophagitis  History of asthma  History of basal cell carcinoma  History of squamous cell carcinoma  History of breast cancer  Orders: Orders Placed This Encounter  Procedures   DG BONE DENSITY (DXA)   QuantiFERON-TB Gold Plus   No orders of the defined types were placed in this encounter.    Follow-Up Instructions: Return in about 5 months (around 05/31/2021) for Rheumatoid arthritis, DDD, Osteoporosis.   Ofilia Neas, PA-C  Note - This record has been created using Dragon software.  Chart creation errors have been sought, but may not always  have been located. Such creation errors do not reflect on  the standard of medical care.,

## 2020-12-25 DIAGNOSIS — Z20822 Contact with and (suspected) exposure to covid-19: Secondary | ICD-10-CM | POA: Diagnosis not present

## 2020-12-31 ENCOUNTER — Ambulatory Visit (INDEPENDENT_AMBULATORY_CARE_PROVIDER_SITE_OTHER): Payer: Medicare Other | Admitting: Physician Assistant

## 2020-12-31 ENCOUNTER — Other Ambulatory Visit: Payer: Self-pay

## 2020-12-31 ENCOUNTER — Encounter: Payer: Self-pay | Admitting: Physician Assistant

## 2020-12-31 VITALS — BP 111/69 | HR 76 | Ht 61.5 in | Wt 127.4 lb

## 2020-12-31 DIAGNOSIS — Z8669 Personal history of other diseases of the nervous system and sense organs: Secondary | ICD-10-CM | POA: Diagnosis not present

## 2020-12-31 DIAGNOSIS — Z8589 Personal history of malignant neoplasm of other organs and systems: Secondary | ICD-10-CM

## 2020-12-31 DIAGNOSIS — F5101 Primary insomnia: Secondary | ICD-10-CM

## 2020-12-31 DIAGNOSIS — M810 Age-related osteoporosis without current pathological fracture: Secondary | ICD-10-CM | POA: Diagnosis not present

## 2020-12-31 DIAGNOSIS — M5136 Other intervertebral disc degeneration, lumbar region: Secondary | ICD-10-CM

## 2020-12-31 DIAGNOSIS — E559 Vitamin D deficiency, unspecified: Secondary | ICD-10-CM | POA: Diagnosis not present

## 2020-12-31 DIAGNOSIS — M0579 Rheumatoid arthritis with rheumatoid factor of multiple sites without organ or systems involvement: Secondary | ICD-10-CM | POA: Diagnosis not present

## 2020-12-31 DIAGNOSIS — K219 Gastro-esophageal reflux disease without esophagitis: Secondary | ICD-10-CM | POA: Diagnosis not present

## 2020-12-31 DIAGNOSIS — M7061 Trochanteric bursitis, right hip: Secondary | ICD-10-CM | POA: Diagnosis not present

## 2020-12-31 DIAGNOSIS — Z853 Personal history of malignant neoplasm of breast: Secondary | ICD-10-CM

## 2020-12-31 DIAGNOSIS — Z85828 Personal history of other malignant neoplasm of skin: Secondary | ICD-10-CM

## 2020-12-31 DIAGNOSIS — M5030 Other cervical disc degeneration, unspecified cervical region: Secondary | ICD-10-CM

## 2020-12-31 DIAGNOSIS — Z79899 Other long term (current) drug therapy: Secondary | ICD-10-CM | POA: Diagnosis not present

## 2020-12-31 DIAGNOSIS — Z8709 Personal history of other diseases of the respiratory system: Secondary | ICD-10-CM | POA: Diagnosis not present

## 2021-01-05 ENCOUNTER — Other Ambulatory Visit (INDEPENDENT_AMBULATORY_CARE_PROVIDER_SITE_OTHER): Payer: Self-pay

## 2021-01-05 ENCOUNTER — Other Ambulatory Visit: Payer: Self-pay | Admitting: *Deleted

## 2021-01-05 DIAGNOSIS — Z0289 Encounter for other administrative examinations: Secondary | ICD-10-CM

## 2021-01-05 DIAGNOSIS — Z79899 Other long term (current) drug therapy: Secondary | ICD-10-CM

## 2021-01-05 DIAGNOSIS — M0579 Rheumatoid arthritis with rheumatoid factor of multiple sites without organ or systems involvement: Secondary | ICD-10-CM

## 2021-01-05 DIAGNOSIS — M0519 Rheumatoid lung disease with rheumatoid arthritis of multiple sites: Secondary | ICD-10-CM | POA: Diagnosis not present

## 2021-01-06 ENCOUNTER — Telehealth: Payer: Self-pay | Admitting: Neurology

## 2021-01-06 LAB — CBC WITH DIFFERENTIAL/PLATELET
Basophils Absolute: 0 10*3/uL (ref 0.0–0.2)
Basos: 1 %
EOS (ABSOLUTE): 0.3 10*3/uL (ref 0.0–0.4)
Eos: 4 %
Hematocrit: 40.7 % (ref 34.0–46.6)
Hemoglobin: 13.4 g/dL (ref 11.1–15.9)
Immature Grans (Abs): 0 10*3/uL (ref 0.0–0.1)
Immature Granulocytes: 0 %
Lymphocytes Absolute: 1.8 10*3/uL (ref 0.7–3.1)
Lymphs: 25 %
MCH: 28.2 pg (ref 26.6–33.0)
MCHC: 32.9 g/dL (ref 31.5–35.7)
MCV: 86 fL (ref 79–97)
Monocytes Absolute: 0.6 10*3/uL (ref 0.1–0.9)
Monocytes: 8 %
Neutrophils Absolute: 4.7 10*3/uL (ref 1.4–7.0)
Neutrophils: 62 %
Platelets: 387 10*3/uL (ref 150–450)
RBC: 4.75 x10E6/uL (ref 3.77–5.28)
RDW: 12.9 % (ref 11.7–15.4)
WBC: 7.5 10*3/uL (ref 3.4–10.8)

## 2021-01-06 LAB — COMPREHENSIVE METABOLIC PANEL
ALT: 15 IU/L (ref 0–32)
AST: 17 IU/L (ref 0–40)
Albumin/Globulin Ratio: 2.2 (ref 1.2–2.2)
Albumin: 4.4 g/dL (ref 3.8–4.8)
Alkaline Phosphatase: 69 IU/L (ref 44–121)
BUN/Creatinine Ratio: 22 (ref 12–28)
BUN: 11 mg/dL (ref 8–27)
Bilirubin Total: 0.3 mg/dL (ref 0.0–1.2)
CO2: 20 mmol/L (ref 20–29)
Calcium: 9.2 mg/dL (ref 8.7–10.3)
Chloride: 102 mmol/L (ref 96–106)
Creatinine, Ser: 0.51 mg/dL — ABNORMAL LOW (ref 0.57–1.00)
Globulin, Total: 2 g/dL (ref 1.5–4.5)
Glucose: 86 mg/dL (ref 70–99)
Potassium: 4.1 mmol/L (ref 3.5–5.2)
Sodium: 139 mmol/L (ref 134–144)
Total Protein: 6.4 g/dL (ref 6.0–8.5)
eGFR: 101 mL/min/{1.73_m2} (ref >59)

## 2021-01-06 NOTE — Telephone Encounter (Signed)
-----   Message from Britt Bottom, MD sent at 01/06/2021  8:00 AM EST ----- Please let the patient know that the lab work is fine.

## 2021-01-06 NOTE — Telephone Encounter (Signed)
Called the pt and reviewed the lab results with her. Pt requested a copy be mailed to her. Confirmed the address was correct on file. Pt verbalized understanding. Pt had no questions at this time but was encouraged to call back if questions arise.

## 2021-01-30 DIAGNOSIS — K219 Gastro-esophageal reflux disease without esophagitis: Secondary | ICD-10-CM | POA: Diagnosis not present

## 2021-01-30 DIAGNOSIS — M069 Rheumatoid arthritis, unspecified: Secondary | ICD-10-CM | POA: Diagnosis not present

## 2021-01-30 DIAGNOSIS — Z0000 Encounter for general adult medical examination without abnormal findings: Secondary | ICD-10-CM | POA: Diagnosis not present

## 2021-01-30 DIAGNOSIS — G4701 Insomnia due to medical condition: Secondary | ICD-10-CM | POA: Diagnosis not present

## 2021-01-30 DIAGNOSIS — Z853 Personal history of malignant neoplasm of breast: Secondary | ICD-10-CM | POA: Diagnosis not present

## 2021-01-30 DIAGNOSIS — M810 Age-related osteoporosis without current pathological fracture: Secondary | ICD-10-CM | POA: Diagnosis not present

## 2021-01-30 DIAGNOSIS — N951 Menopausal and female climacteric states: Secondary | ICD-10-CM | POA: Diagnosis not present

## 2021-01-30 DIAGNOSIS — R7303 Prediabetes: Secondary | ICD-10-CM | POA: Diagnosis not present

## 2021-01-30 DIAGNOSIS — K649 Unspecified hemorrhoids: Secondary | ICD-10-CM | POA: Diagnosis not present

## 2021-01-30 DIAGNOSIS — J45909 Unspecified asthma, uncomplicated: Secondary | ICD-10-CM | POA: Diagnosis not present

## 2021-01-30 DIAGNOSIS — E7800 Pure hypercholesterolemia, unspecified: Secondary | ICD-10-CM | POA: Diagnosis not present

## 2021-01-30 DIAGNOSIS — Z1389 Encounter for screening for other disorder: Secondary | ICD-10-CM | POA: Diagnosis not present

## 2021-02-02 DIAGNOSIS — M0519 Rheumatoid lung disease with rheumatoid arthritis of multiple sites: Secondary | ICD-10-CM | POA: Diagnosis not present

## 2021-03-06 ENCOUNTER — Telehealth: Payer: Self-pay

## 2021-03-06 NOTE — Telephone Encounter (Signed)
Patient called stating she had Covid from 02-08-2021 to 03-02-2021.  Patient is scheduled for her Orencia infusion on Monday, 03/09/21.  Patient requested a return call to let her know if it is okay to keep appointment or if she needs to reschedule to later date.

## 2021-03-06 NOTE — Telephone Encounter (Signed)
Patient advised it is recommended that she postpone her infusion for 1 week longer as long as symptoms have completely resolved. Patient expressed understanding.

## 2021-03-06 NOTE — Telephone Encounter (Signed)
Please recommend for the patient to postpone infusion for 1 month week as long as her symptoms have completely resolved.

## 2021-03-16 ENCOUNTER — Other Ambulatory Visit (INDEPENDENT_AMBULATORY_CARE_PROVIDER_SITE_OTHER): Payer: Self-pay

## 2021-03-16 ENCOUNTER — Other Ambulatory Visit: Payer: Self-pay | Admitting: *Deleted

## 2021-03-16 DIAGNOSIS — Z79899 Other long term (current) drug therapy: Secondary | ICD-10-CM

## 2021-03-16 DIAGNOSIS — M0579 Rheumatoid arthritis with rheumatoid factor of multiple sites without organ or systems involvement: Secondary | ICD-10-CM

## 2021-03-16 DIAGNOSIS — Z0289 Encounter for other administrative examinations: Secondary | ICD-10-CM

## 2021-03-16 DIAGNOSIS — M0519 Rheumatoid lung disease with rheumatoid arthritis of multiple sites: Secondary | ICD-10-CM | POA: Diagnosis not present

## 2021-03-17 LAB — COMPREHENSIVE METABOLIC PANEL
ALT: 14 IU/L (ref 0–32)
AST: 13 IU/L (ref 0–40)
Albumin/Globulin Ratio: 2.4 — ABNORMAL HIGH (ref 1.2–2.2)
Albumin: 4.4 g/dL (ref 3.8–4.8)
Alkaline Phosphatase: 72 IU/L (ref 44–121)
BUN/Creatinine Ratio: 25 (ref 12–28)
BUN: 13 mg/dL (ref 8–27)
Bilirubin Total: <0.2 mg/dL (ref 0.0–1.2)
CO2: 21 mmol/L (ref 20–29)
Calcium: 8.7 mg/dL (ref 8.7–10.3)
Chloride: 104 mmol/L (ref 96–106)
Creatinine, Ser: 0.51 mg/dL — ABNORMAL LOW (ref 0.57–1.00)
Globulin, Total: 1.8 g/dL (ref 1.5–4.5)
Glucose: 152 mg/dL — ABNORMAL HIGH (ref 70–99)
Potassium: 4 mmol/L (ref 3.5–5.2)
Sodium: 139 mmol/L (ref 134–144)
Total Protein: 6.2 g/dL (ref 6.0–8.5)
eGFR: 101 mL/min/{1.73_m2} (ref >59)

## 2021-03-17 LAB — CBC WITH DIFFERENTIAL/PLATELET
Basophils Absolute: 0 10*3/uL (ref 0.0–0.2)
Basos: 0 %
EOS (ABSOLUTE): 0.4 10*3/uL (ref 0.0–0.4)
Eos: 5 %
Hematocrit: 34.8 % (ref 34.0–46.6)
Hemoglobin: 11.7 g/dL (ref 11.1–15.9)
Immature Grans (Abs): 0 10*3/uL (ref 0.0–0.1)
Immature Granulocytes: 0 %
Lymphocytes Absolute: 2 10*3/uL (ref 0.7–3.1)
Lymphs: 26 %
MCH: 28.7 pg (ref 26.6–33.0)
MCHC: 33.6 g/dL (ref 31.5–35.7)
MCV: 86 fL (ref 79–97)
Monocytes Absolute: 0.5 10*3/uL (ref 0.1–0.9)
Monocytes: 6 %
Neutrophils Absolute: 4.7 10*3/uL (ref 1.4–7.0)
Neutrophils: 63 %
Platelets: 394 10*3/uL (ref 150–450)
RBC: 4.07 x10E6/uL (ref 3.77–5.28)
RDW: 13.2 % (ref 11.7–15.4)
WBC: 7.6 10*3/uL (ref 3.4–10.8)

## 2021-03-30 ENCOUNTER — Other Ambulatory Visit: Payer: Self-pay | Admitting: Physician Assistant

## 2021-03-30 DIAGNOSIS — M810 Age-related osteoporosis without current pathological fracture: Secondary | ICD-10-CM

## 2021-03-30 DIAGNOSIS — E559 Vitamin D deficiency, unspecified: Secondary | ICD-10-CM

## 2021-04-13 ENCOUNTER — Ambulatory Visit (INDEPENDENT_AMBULATORY_CARE_PROVIDER_SITE_OTHER): Payer: Medicare Other | Admitting: Neurology

## 2021-04-13 ENCOUNTER — Encounter: Payer: Self-pay | Admitting: Neurology

## 2021-04-13 VITALS — BP 123/60 | HR 78 | Ht 61.5 in | Wt 125.0 lb

## 2021-04-13 DIAGNOSIS — Z8669 Personal history of other diseases of the nervous system and sense organs: Secondary | ICD-10-CM

## 2021-04-13 DIAGNOSIS — Z79899 Other long term (current) drug therapy: Secondary | ICD-10-CM | POA: Diagnosis not present

## 2021-04-13 DIAGNOSIS — M0579 Rheumatoid arthritis with rheumatoid factor of multiple sites without organ or systems involvement: Secondary | ICD-10-CM

## 2021-04-13 DIAGNOSIS — M47812 Spondylosis without myelopathy or radiculopathy, cervical region: Secondary | ICD-10-CM | POA: Diagnosis not present

## 2021-04-13 DIAGNOSIS — R200 Anesthesia of skin: Secondary | ICD-10-CM | POA: Diagnosis not present

## 2021-04-13 NOTE — Progress Notes (Signed)
[facility]  [name] DOB: 1950-04-28  REFERRING DOCTOR OR PCP:  Dr. Estanislado Pandy SOURCE: patient, notes from Dr. Estanislado Pandy, labs  _________________________________   HISTORICAL  CHIEF COMPLAINT:  Chief Complaint  Patient presents with   Follow-up    Rm 1, alone. Here for yearly f/u. Pt reports doing well. No new or worsening in sx since last ov.     HISTORY OF PRESENT ILLNESS:  Kristy Perez is a 71 y.o. woman on Orencia infusions for her rheumatoid arthritis.  Update 03/31/2020: She is on Orencia for rheumatoid arthritis. She gets the labwork every other month and results are stable..   She tolerates it well.  She feels her rheumatoid arthritis symptoms have improved.  She continues to have some pain in the feet and neck.  Some neck stiffness.  In 2020 she had L2-L3 lumbar surgery which has helped symptoms in her back and legs.   She had breast cancer 20 years ago.   She has had basal cell cancer several times on face.      She has mild numbness in her fingertips bilaterally.  She notes textures are harder to distinguish and less ability to recognize objects in pockets.   She also has reduced strength in her hands.   She notes a riht foot drop if she walks longer.   She has mild numbness in toes.    She also has a mild Chiari malformation. She denies headaches or syncope. She does note that if she calls she gets a headache. She has had some hoarseness of her voice and has seen ENT. The vocal cords look fine.  I reviewed her past MRI.  also has some degenerative pannus at C1-C2 and more typical DJD in the mid cervical spine.  No nerve root compression..     She has 3-5 mm of cerebellar ectopia.     She gets brain or cervical spine studies periodically to follow this as she is at risk due to her rheumatoid arthritis and potential pannus development. Adjacent brain appears normal.   This NCV/EMG 04/01/2020 study showed the following: 1.  No evidence of  polyneuropathy 2.  Borderline median neuropathy across the right wrist.  There is no evidence of right focal ulnar neuropathy. 3.  Mild right L3 (+/- L4),  L5 and S1 chronic radiculopathies  Rheumatoid arthritis history: She had had problems with joint pain, especially in her feet times many years. Around 2002, she was diagnosed with rheumatoid arthritis after a rheumatoid factor returned positive. He was to start methotrexate and Plaquenil but was diagnosed with breast cancer around that time so those medicines were discontinued while she was undergoing treatment with breast cancer. She had a complete response to her treatment (surgery, chemotherapy and radiation) remains cancer free.   After her chemotherapy, she went back on plaquenil until plus methotrexate injections. About 12 years ago, she switched to Humira and was on that for about 8 years. She had many dental issues including abscesses and it was discontinued. Enbrel was tried but it was less effective for her RA. About 4 years ago she started Orencia infusions. She has tolerated them well and has not had any problems with infections. Typically, the day of the infusion she will fill tired or achy. The next day she may also feel tired. The rest of the month she does well..    She is on Orencia 500 mg IV every 4 weeks.    She had her last infusion 06/15/2016.  She has allergies to Iodine injection (xray/CT) and fish.  She does ok with Betadine.    Besides breast cancer, she has had had several Basal cell and squamous cell cancers.   She had Mohs surgery on her face.      Chiari malformation history: She also has had a Chiari Malformation and was seeing Dr. Carloyn Manner of Neurosurgery.   Because she also has risk of pannus development from her rheumatoid arthritis, and the MRI on an annual or near annual basis.   Imaging: MRI of her brain January 2018 shows a borderline Chiari malformation (looked mild on prior MRI). The adjacent brain and spinal cord  have normal signal.  She has some degenerative pannus at C1-C2.  MRI of the lumbar spine 06/20/2019 shows postoperative changes at L2-L3 (right extraforaminal microdiscectomy) there is stable grade 1 spondylolisthesis due to pars defects at L5 and associated with facet hypertrophy and foraminal narrowing.  MRI cervical spine 04/03/2020 showed :  The spinal cord appears normal.    The cerebellar tonsils extend just below the foramen magnum but not enough to be considered a Chiari malformation.  There is no stenosis at the foramen magnum.   Mild multilevel degenerative changes as detailed above that do not lead to nerve root compression or significant spinal stenosis..   Degenerative changes show no significant progression compared to the 09/22/2004 MRI.  REVIEW OF SYSTEMS: Constitutional: No fevers, chills, sweats, or change in appetite.    She gets fatigue the last week of each Orencia cycle.     Eyes: No visual changes, double vision, eye pain Ear, nose and throat: No hearing loss, ear pain, nasal congestion, sore throat Cardiovascular: No chest pain, palpitations Respiratory:  No shortness of breath at rest or with exertion.   No wheezes GastrointestinaI: No nausea, vomiting, diarrhea, abdominal pain, fecal incontinence.   She has GERD Genitourinary:  No dysuria, urinary retention.   She notes urinary frequency and nocturia. Musculoskeletal:  as above Integumentary: No rash, pruritus, skin lesions Neurological: as above.   She notes decreased focus, improved with Adderall Psychiatric: No depression at this time.  No anxiety Endocrine: No palpitations, diaphoresis, change in appetite, change in weigh or increased thirst Hematologic/Lymphatic:  No anemia, purpura, petechiae. Allergic/Immunologic: No itchy/runny eyes, nasal congestion, recent allergic reactions, rashes  ALLERGIES: Allergies  Allergen Reactions   Fish Allergy Anaphylaxis   Iodine Anaphylaxis    " PER PT IV CONTRAST"   Boniva  [Ibandronic Acid]     vomiting    Erythromycin     UNSPECIFIED REACTION    Fosamax [Alendronate]     vomiting    Sulfonamide Derivatives Other (See Comments)    Tongue turns black.    HOME MEDICATIONS:  Current Outpatient Medications:    Abatacept (ORENCIA IV), Inject 500 mg into the vein every 30 (thirty) days. Infuse 2 vials / 500 mg over 30 minutes every 4 weeks, Disp: , Rfl:    acetaminophen (TYLENOL) 325 MG tablet, Take 650 mg by mouth daily as needed (One hour prior to infusion). , Disp: , Rfl:    ADVAIR HFA 45-21 MCG/ACT inhaler, SMARTSIG:2 Puff(s) By Mouth Twice Daily, Disp: , Rfl:    albuterol (VENTOLIN HFA) 108 (90 Base) MCG/ACT inhaler, Inhale 2 puffs into the lungs every 6 (six) hours as needed for wheezing or shortness of breath., Disp: , Rfl:    amoxicillin (AMOXIL) 500 MG capsule, Take 2,000 mg by mouth See admin instructions. Take 2000 mg by mouth 1 hour  prior to dental appointment, Disp: , Rfl: 2   amphetamine-dextroamphetamine (ADDERALL) 5 MG tablet, Take 10 mg by mouth daily as needed (stress). , Disp: , Rfl: 0   chlorpheniramine (CHLOR-TRIMETON) 4 MG tablet, Take 4 mg by mouth daily as needed for allergies., Disp: , Rfl:    Cholecalciferol (VITAMIN D) 50 MCG (2000 UT) CAPS, Take 2,000 Units by mouth daily., Disp: , Rfl:    diphenhydrAMINE (BENADRYL) 25 mg capsule, Take 25 mg by mouth daily as needed (One hour prior to infusion). , Disp: , Rfl:    famotidine (PEPCID) 20 MG tablet, One after  bfast and supper, Disp: 60 tablet, Rfl: 11   ibuprofen (ADVIL,MOTRIN) 200 MG tablet, Take 400 mg by mouth every 6 (six) hours as needed (for inflammation)., Disp: , Rfl:    Liniments (SALONPAS EX), Apply topically as needed., Disp: , Rfl:    Magnesium 250 MG TABS, Take 500-1,000 tablets by mouth daily. , Disp: , Rfl:    NONFORMULARY OR COMPOUNDED ITEM, Estradiol 0.02% vaginal cream.  Place 1/2 gram vaginally two to three times weekly at bedtime. Disp:  3 month supply (Patient taking  differently: Place 0.5 Applicatorfuls vaginally once a week. Estradiol 0.02% vaginal cream.  Place 1/2 gram vaginally two to three times weekly at bedtime. Disp:  3 month supply), Disp: 1 each, Rfl: 3   Omega-3 1000 MG CAPS, Take 1 capsule by mouth daily. Plant based, Disp: , Rfl:    promethazine (PHENERGAN) 25 MG tablet, Take 25 mg by mouth every 6 (six) hours as needed for nausea or vomiting., Disp: , Rfl:    RESTASIS 0.05 % ophthalmic emulsion, Place 1 drop into both eyes every morning. , Disp: , Rfl:    rosuvastatin (CRESTOR) 5 MG tablet, Take 5 mg by mouth every other day., Disp: , Rfl:    Sodium Fluoride 1.1 % PSTE, Place 1 application onto teeth 2 (two) times daily. , Disp: , Rfl:    temazepam (RESTORIL) 30 MG capsule, Take 30 mg by mouth at bedtime. , Disp: , Rfl:    Vitamin D, Ergocalciferol, (DRISDOL) 1.25 MG (50000 UNIT) CAPS capsule, Take 1 capsule (50,000 Units total) by mouth every 7 (seven) days., Disp: 13 capsule, Rfl: 0  PAST MEDICAL HISTORY: Past Medical History:  Diagnosis Date   Anemia    Asthma    inhaler one x per day   Cancer ([place])    breast, left   Chiari malformation    Difficult intubation    cannot hyperextend neck due to Chiari malformation with tonsillar involvement, RA   Dysrhythmia 2015   history of PAC's   GERD (gastroesophageal reflux disease)    High cholesterol    dx by PCP per patient    PONV (postoperative nausea and vomiting)    Rheumatoid arthritis ([place])    on Humira    PAST SURGICAL HISTORY: Past Surgical History:  Procedure Laterality Date   BREAST SURGERY Left 2003   Lumpectomy T1C1, NO and negative ER/ PR   COLONOSCOPY  10/04   COLONOSCOPY  11/10   normal recheck in 5 years   COLPORRHAPHY     ESOPHAGOGASTRODUODENOSCOPY ENDOSCOPY  11/10   GERD   FOOT SURGERY Left age 71   removal of pseudo rheumatoid nodules from left forefoot.   LAPAROSCOPIC BILATERAL SALPINGO OOPHERECTOMY N/A 07/30/2013   Procedure: LAPAROSCOPIC BILATERAL SALPINGO  OOPHORECTOMY WITH WASHINGS;  Surgeon: Lyman Speller, MD;  Location: [facility];  Service: Gynecology;  Laterality: N/A;   LUMBAR  LAMINECTOMY/DECOMPRESSION MICRODISCECTOMY Right 04/06/2018   Procedure: Extraforaminal Microdiscectomy - Lumbar Two-Lumbar Three - right;  Surgeon: Eustace Femia, MD;  Location: [place];  Service: Neurosurgery;  Laterality: Right;  Extraforaminal Microdiscectomy - Lumbar Two-Lumbar Three - right   NASAL SEPTOPLASTY W/ TURBINOPLASTY Bilateral 05/24/2014   Procedure: NASAL SEPTOPLASTY WITH  BILATERAL TURBINATE REDUCTION;  Surgeon: Jerrell Belfast, MD;  Location: [facility];  Service: ENT;  Laterality: Bilateral;   PELVIC FLOOR REPAIR  3/04   [place] with AP colporrhaphy   PILONIDAL CYST EXCISION     TOTAL VAGINAL HYSTERECTOMY  1989   with AP repair    FAMILY HISTORY: Family History  Problem Relation Age of Onset   Diabetes Mother    Heart failure Mother    Bladder Cancer Mother    Stroke Mother    Cancer Father        head and neck   Uterine cancer Maternal Aunt    Bladder Cancer Maternal Grandmother    Colon cancer Maternal Grandmother     SOCIAL HISTORY:  Social History   Socioeconomic History   Marital status: Divorced    Spouse name: Not on file   Number of children: 2   Years of education: Not on file   Highest education level: Not on file  Occupational History   Not on file  Tobacco Use   Smoking status: Former    Packs/day: 1.50    Years: 30.00    Pack years: 45.00    Types: Cigarettes    Quit date: 02/16/1999    Years since quitting: 22.1   Smokeless tobacco: Never  Vaping Use   Vaping Use: Never used  Substance and Sexual Activity   Alcohol use: No   Drug use: No   Sexual activity: Not Currently    Partners: Male  Other Topics Concern   Not on file  Social History Narrative   Lives alone in a 2 story home. Has 2 children.  Works as a Radiation protection practitioner.  Education: college.    Social Determinants of Health   Financial Resource Strain: Not on  file  Food Insecurity: Not on file  Transportation Needs: Not on file  Physical Activity: Not on file  Stress: Not on file  Social Connections: Not on file  Intimate Partner Violence: Not on file     PHYSICAL EXAM  Vitals:   04/13/21 1047  BP: 123/60  Pulse: 78  Weight: 125 lb (56.7 kg)  Height: 5' 1.5" (1.562 m)    Body mass index is 23.24 kg/m.   General: The patient is well-developed and well-nourished and in no acute distress   Neck:  The neck is nontender.   Skin: Extremities are without rash or edema.  Musculoskeletal:  Back is nontender.   Joints in her feet and hands are tender.   She has ulnar deviation of the fingers bilaterally, much worse on the right.  She has a nodule on the right wrist.  Neurologic Exam  Mental status: The patient is alert and oriented x 3 at the time of the examination. The patient has apparent normal recent and remote memory, with an apparently normal attention span and concentration ability.   Speech is normal.  Cranial nerves: Extraocular movements are full. Facial strength and sensation is normal.  Trapezius and sternocleidomastoid strength is normal.  . No obvious hearing deficits are noted.  Motor:  Muscle tone is normal. Muscle bulk is reduced in the ulnar innervated right hand muscles.  3 to 4-/5  right ulnar hand muscle strength.  4+/5 bilateral APB muscles (median) 4/5 right EHL and 4+/5 right ankle extensors .  4+/5 left EHL.     Strength is 5/5.elsewhere  Sensory;  Tienls at right wrist. Reduced pinprick but normal vibration in fingertips, superimpose reduced sensation to pp hypothenar eminence, right worse than left.  Reduced sensation to vibration in toes (near absent but normal at ankles).  Reduced pp in toes adjacent feet, right peroneal/L5 distribution moreso than lateral foot   Just mildly redued pinprick plantars.    Coordination: Cerebellar testing reveals good finger-nose-finger bilaterally.   Mild reduced coordination in  hands and toe tap.    Gait and station: Station is normal.  The gait is slightly ataxic, arthritic and she has a very mild right foot drop.  The tandem gait is mildly wide.  Romberg is negative.  Reflexes: Deep tendon reflexes are symmetric and normal bilaterally except absnet at ankles.Marland Kitchen        DIAGNOSTIC DATA (LABS, IMAGING, TESTING) - I reviewed patient records, labs, notes, testing and imaging myself where available.  Lab Results  Component Value Date   WBC 7.6 03/16/2021   HGB 11.7 03/16/2021   HCT 34.8 03/16/2021   MCV 86 03/16/2021   PLT 394 03/16/2021      Component Value Date/Time   NA 139 03/16/2021 1433   K 4.0 03/16/2021 1433   CL 104 03/16/2021 1433   CO2 21 03/16/2021 1433   GLUCOSE 152 (H) 03/16/2021 1433   GLUCOSE 91 03/30/2018 1051   BUN 13 03/16/2021 1433   CREATININE 0.51 (L) 03/16/2021 1433   CALCIUM 8.7 03/16/2021 1433   PROT 6.2 03/16/2021 1433   ALBUMIN 4.4 03/16/2021 1433   AST 13 03/16/2021 1433   ALT 14 03/16/2021 1433   ALKPHOS 72 03/16/2021 1433   BILITOT <0.2 03/16/2021 1433   GFRNONAA 98 03/31/2020 1049   GFRAA 114 03/31/2020 1049   Lab Results  Component Value Date   CHOL 228 (H) 07/07/2017   HDL 68 07/07/2017   LDLCALC 142 (H) 07/07/2017   TRIG 91 07/07/2017   CHOLHDL 3.4 07/07/2017   Lab Results  Component Value Date   HGBA1C 5.2 03/08/2016   Lab Results  Component Value Date   VITAMINB12 280 03/31/2020   Lab Results  Component Value Date   TSH 1.20 03/08/2016       ASSESSMENT AND PLAN  Rheumatoid arthritis with rheumatoid factor of multiple sites without organ or systems involvement ([place])  High risk medication use  History of Chiari malformation  Numbness  Osteoarthritis of cervical spine, unspecified spinal osteoarthritis complication status    1.    She will continue Orencia 500 mg every 4 weeks.  Labs will be checked every 8 weeks.   2.    The Chiari malformation appears to be stable.  It is fairly mild  and there is no pegging of the cerebellar tonsils against  the brainstem.  She does not have significant symptoms from it.  She also has pannus at C1-C2 from the rheumatoid arthritis. He has no significant stenosis at the cervicomedullary junction. 3.    return in one year for general visit in regards to infusion.   She will call sooner if she has any new or worsening neurologic symptoms.    Lynton Crescenzo A. Felecia Shelling, MD, PhD 2/95/2841, 32:44 PM Certified in Neurology, Clinical Neurophysiology, Sleep Medicine, Pain Medicine and Neuroimaging  [facility] [address] [place], [place] [address] [phone]  [phone]

## 2021-04-15 DIAGNOSIS — M0519 Rheumatoid lung disease with rheumatoid arthritis of multiple sites: Secondary | ICD-10-CM | POA: Diagnosis not present

## 2021-05-12 DIAGNOSIS — Z20822 Contact with and (suspected) exposure to covid-19: Secondary | ICD-10-CM | POA: Diagnosis not present

## 2021-05-20 ENCOUNTER — Other Ambulatory Visit: Payer: Self-pay | Admitting: Neurology

## 2021-05-20 ENCOUNTER — Other Ambulatory Visit (INDEPENDENT_AMBULATORY_CARE_PROVIDER_SITE_OTHER): Payer: Self-pay

## 2021-05-20 DIAGNOSIS — Z79899 Other long term (current) drug therapy: Secondary | ICD-10-CM

## 2021-05-20 DIAGNOSIS — Z0289 Encounter for other administrative examinations: Secondary | ICD-10-CM

## 2021-05-20 DIAGNOSIS — E559 Vitamin D deficiency, unspecified: Secondary | ICD-10-CM | POA: Diagnosis not present

## 2021-05-20 DIAGNOSIS — M0579 Rheumatoid arthritis with rheumatoid factor of multiple sites without organ or systems involvement: Secondary | ICD-10-CM | POA: Diagnosis not present

## 2021-05-20 DIAGNOSIS — M0519 Rheumatoid lung disease with rheumatoid arthritis of multiple sites: Secondary | ICD-10-CM | POA: Diagnosis not present

## 2021-05-20 DIAGNOSIS — G603 Idiopathic progressive neuropathy: Secondary | ICD-10-CM

## 2021-05-20 NOTE — Progress Notes (Signed)
? ?Office Visit Note ? ?[name]             ?Date of Birth: [birth year]           ?MRN: [id number]             ?PCP: Kelton Pillar, MD ?Referring: Kelton Pillar, MD ?Visit Date: 06/03/2021 ?Occupation: '@GUAROCC'$ @ ? ?Subjective:  ?Medication management. ? ?History of Present Illness: Kristy Perez is a 71 y.o. female with history of seropositive rheumatoid factor.  She has been getting IV Orencia at the hospital on a regular basis.  She denies any joint pain or joint swelling.  She states she has been active and doing routine activities.  She continues to have some discomfort in her neck and lower back due to underlying disc disease.  She has intermittent discomfort in the right trochanteric bursa.  She is waiting to get her DEXA scan in June prior to making a decision on the treatment of osteoporosis.  She takes calcium and vitamin D and exercises on a regular basis. ? ?Activities of Daily Living:  ?Patient reports morning stiffness for 0 minutes.   ?Patient Reports nocturnal pain.  ?Difficulty dressing/grooming: Denies ?Difficulty climbing stairs: Denies ?Difficulty getting out of chair: Denies ?Difficulty using hands for taps, buttons, cutlery, and/or writing: Reports ? ?Review of Systems  ?Constitutional:  Positive for fatigue.  ?HENT:  Positive for mouth dryness and nose dryness. Negative for mouth sores.   ?Eyes:  Positive for dryness. Negative for pain and itching.  ?Respiratory:  Positive for shortness of breath. Negative for difficulty breathing.   ?Cardiovascular:  Negative for chest pain and palpitations.  ?Gastrointestinal:  Negative for blood in stool, constipation and diarrhea.  ?Endocrine: Negative for increased urination.  ?Genitourinary:  Positive for involuntary urination. Negative for difficulty urinating.  ?Musculoskeletal:  Positive for joint pain and joint pain. Negative for joint swelling, myalgias, morning stiffness, muscle tenderness and myalgias.  ?Skin:  Negative for color  change, rash and redness.  ?Allergic/Immunologic: Negative for susceptible to infections.  ?Neurological:  Positive for dizziness and numbness. Negative for headaches, memory loss and weakness.  ?Hematological:  Negative for bruising/bleeding tendency.  ?Psychiatric/Behavioral:  Positive for sleep disturbance.   ? ?PMFS History:  ?Patient Active Problem List  ? Diagnosis Date Noted  ? Numbness 03/31/2020  ? Mononeuropathy multiplex syndrome 03/31/2020  ? Neuritis of right ulnar nerve 03/31/2020  ? Polyneuropathy 03/31/2020  ? Hoarseness with classic voice fatigue 10/10/2018  ? S/P lumbar laminectomy 04/06/2018  ? Radiculopathy due to lumbar intervertebral disc disorder 12/07/2017  ? Congenital spondylolysis 12/07/2017  ? Spondylolisthesis of lumbar region 12/07/2017  ? DDD (degenerative disc disease), lumbar 12/08/2016  ? High risk medication use 03/15/2016  ? DJD (degenerative joint disease), cervical 03/15/2016  ? History of asthma 03/15/2016  ? History of squamous cell carcinoma 03/15/2016  ? History of basal cell carcinoma 03/15/2016  ? History of breast cancer 03/15/2016  ? History of Chiari malformation 03/15/2016  ? Vitamin D deficiency 03/15/2016  ? Rheumatoid arthritis with rheumatoid factor of multiple sites without organ or systems involvement ([place]) 10/20/2015  ? Esophageal reflux 05/01/2015  ? Anxiety 05/01/2015  ? Sjogrens syndrome ([place]) 05/01/2015  ? Chronic obstructive airway disease with asthma ([place]) 05/01/2015  ? Deviated nasal septum 05/24/2014  ?  Class: Chronic  ? Insomnia 11/25/2007  ? DOE (dyspnea on exertion) 11/15/2007  ?  ?Past Medical History:  ?Diagnosis Date  ? Anemia   ? Asthma   ?  inhaler one x per day  ? Cancer [facility])   ? breast, left  ? Chiari malformation   ? Difficult intubation   ? cannot hyperextend neck due to Chiari malformation with tonsillar involvement, RA  ? Dysrhythmia 2015  ? history of PAC's  ? GERD (gastroesophageal reflux disease)   ? High cholesterol   ? dx by PCP per  patient   ? PONV (postoperative nausea and vomiting)   ? Rheumatoid arthritis ([place])   ? on Humira  ?  ?Family History  ?Problem Relation Age of Onset  ? Diabetes Mother   ? Heart failure Mother   ? Bladder Cancer Mother   ? Stroke Mother   ? Cancer Father   ?     head and neck  ? Uterine cancer Maternal Aunt   ? Bladder Cancer Maternal Grandmother   ? Colon cancer Maternal Grandmother   ? ?Past Surgical History:  ?Procedure Laterality Date  ? BREAST SURGERY Left 2003  ? Lumpectomy T1C1, NO and negative ER/ PR  ? COLONOSCOPY  10/04  ? COLONOSCOPY  11/10  ? normal recheck in 5 years  ? COLPORRHAPHY    ? ESOPHAGOGASTRODUODENOSCOPY ENDOSCOPY  11/10  ? GERD  ? FOOT SURGERY Left age 11  ? removal of pseudo rheumatoid nodules from left forefoot.  ? LAPAROSCOPIC BILATERAL SALPINGO OOPHERECTOMY N/A 07/30/2013  ? Procedure: LAPAROSCOPIC BILATERAL SALPINGO OOPHORECTOMY WITH WASHINGS;  Surgeon: Lyman Speller, MD;  Location: [place] [facility];  Service: Gynecology;  Laterality: N/A;  ? LUMBAR LAMINECTOMY/DECOMPRESSION MICRODISCECTOMY Right 04/06/2018  ? Procedure: Extraforaminal Microdiscectomy - Lumbar Two-Lumbar Three - right;  Surgeon: Eustace Kehres, MD;  Location: [place];  Service: Neurosurgery;  Laterality: Right;  Extraforaminal Microdiscectomy - Lumbar Two-Lumbar Three - right  ? NASAL SEPTOPLASTY W/ TURBINOPLASTY Bilateral 05/24/2014  ? Procedure: NASAL SEPTOPLASTY WITH  BILATERAL TURBINATE REDUCTION;  Surgeon: Jerrell Belfast, MD;  Location: [place];  Service: ENT;  Laterality: Bilateral;  ? PELVIC FLOOR REPAIR  3/04  ? [place] with AP colporrhaphy  ? PILONIDAL CYST EXCISION    ? TOTAL VAGINAL HYSTERECTOMY  1989  ? with AP repair  ? ?Social History  ? ?Social History Narrative  ? Lives alone in a 2 story home. Has 2 children.  Works as a Radiation protection practitioner.  Education: college.   ? ?Immunization History  ?Administered Date(s) Administered  ? Fluad Quad(high Dose 65+) 10/14/2018  ? Influenza, High Dose Seasonal PF 10/26/2017  ?  Influenza,inj,Quad PF,6+ Mos 10/18/2016  ? Influenza-Unspecified 10/08/2014, 10/24/2015  ? Janssen (J&J) SARS-COV-2 Vaccination 05/01/2019  ? PFIZER(Purple Top)SARS-COV-2 Vaccination 02/15/2020, 09/03/2020  ? Pneumococcal-Unspecified 10/18/2007  ? Tdap 11/24/2008, 05/04/2021  ? Zoster Recombinat (Shingrix) 12/25/2016  ?  ? ?Objective: ?Vital Signs: BP 117/67 (BP Location: Left Arm, Patient Position: Sitting, Cuff Size: Normal)   Pulse 84   Ht 5' 1.5" (1.562 m)   Wt 129 lb 9.6 oz (58.8 kg)   LMP 02/15/1985 (Approximate)   BMI 24.09 kg/m?   ? ?Physical Exam ?Vitals and nursing note reviewed.  ?Constitutional:   ?   Appearance: She is well-developed.  ?HENT:  ?   Head: Normocephalic and atraumatic.  ?Eyes:  ?   Conjunctiva/sclera: Conjunctivae normal.  ?Cardiovascular:  ?   Rate and Rhythm: Normal rate and regular rhythm.  ?   Heart sounds: Normal heart sounds.  ?Pulmonary:  ?   Effort: Pulmonary effort is normal.  ?   Breath sounds: Normal breath sounds.  ?Abdominal:  ?   General: Bowel  sounds are normal.  ?   Palpations: Abdomen is soft.  ?Musculoskeletal:  ?   Cervical back: Normal range of motion.  ?Lymphadenopathy:  ?   Cervical: No cervical adenopathy.  ?Skin: ?   General: Skin is warm and dry.  ?   Capillary Refill: Capillary refill takes less than 2 seconds.  ?Neurological:  ?   Mental Status: She is alert and oriented to person, place, and time.  ?Psychiatric:     ?   Behavior: Behavior normal.  ?  ? ?Musculoskeletal Exam: She had good range of motion of the cervical spine.  She had limited range of motion of her lumbar spine with some discomfort.  There was no point tenderness over lumbar spine.  Shoulder joints and elbow joints with good range of motion without synovitis.  She had no synovitis over wrist joints.  She had tenderness over right CMC joint.  Right MCP joint ulnar deviation was noted.  PIP and DIP thickening with no synovitis was noted.  Hip joints were in good range of motion.  Knee joints  were in good range of motion without any warmth swelling or effusion.  There was no tenderness over ankles or MTPs. ? ?CDAI Exam: ?CDAI Score: 0.2  ?Patient Global: 1 mm; Provider Global: 1 mm ?Swollen: 0 ; Tender: 0  ?J

## 2021-05-21 LAB — COMPREHENSIVE METABOLIC PANEL
ALT: 18 IU/L (ref 0–32)
AST: 18 IU/L (ref 0–40)
Albumin/Globulin Ratio: 2.4 — ABNORMAL HIGH (ref 1.2–2.2)
Albumin: 4.4 g/dL (ref 3.8–4.8)
Alkaline Phosphatase: 75 IU/L (ref 44–121)
BUN/Creatinine Ratio: 29 — ABNORMAL HIGH (ref 12–28)
BUN: 15 mg/dL (ref 8–27)
Bilirubin Total: 0.2 mg/dL (ref 0.0–1.2)
CO2: 19 mmol/L — ABNORMAL LOW (ref 20–29)
Calcium: 9.4 mg/dL (ref 8.7–10.3)
Chloride: 101 mmol/L (ref 96–106)
Creatinine, Ser: 0.51 mg/dL — ABNORMAL LOW (ref 0.57–1.00)
Globulin, Total: 1.8 g/dL (ref 1.5–4.5)
Glucose: 141 mg/dL — ABNORMAL HIGH (ref 70–99)
Potassium: 3.9 mmol/L (ref 3.5–5.2)
Sodium: 138 mmol/L (ref 134–144)
Total Protein: 6.2 g/dL (ref 6.0–8.5)
eGFR: 101 mL/min/{1.73_m2} (ref >59)

## 2021-05-21 LAB — CBC WITH DIFFERENTIAL/PLATELET
Basophils Absolute: 0 10*3/uL (ref 0.0–0.2)
Basos: 1 %
EOS (ABSOLUTE): 0.4 10*3/uL (ref 0.0–0.4)
Eos: 5 %
Hematocrit: 38.1 % (ref 34.0–46.6)
Hemoglobin: 12.8 g/dL (ref 11.1–15.9)
Immature Grans (Abs): 0 10*3/uL (ref 0.0–0.1)
Immature Granulocytes: 0 %
Lymphocytes Absolute: 2.4 10*3/uL (ref 0.7–3.1)
Lymphs: 31 %
MCH: 28.6 pg (ref 26.6–33.0)
MCHC: 33.6 g/dL (ref 31.5–35.7)
MCV: 85 fL (ref 79–97)
Monocytes Absolute: 0.5 10*3/uL (ref 0.1–0.9)
Monocytes: 7 %
Neutrophils Absolute: 4.2 10*3/uL (ref 1.4–7.0)
Neutrophils: 56 %
Platelets: 357 10*3/uL (ref 150–450)
RBC: 4.48 x10E6/uL (ref 3.77–5.28)
RDW: 13.1 % (ref 11.7–15.4)
WBC: 7.5 10*3/uL (ref 3.4–10.8)

## 2021-05-25 ENCOUNTER — Telehealth: Payer: Self-pay

## 2021-05-25 NOTE — Telephone Encounter (Signed)
-----   Message from Britt Bottom, MD sent at 05/21/2021  6:28 PM EDT ----- ?Please let the patient know that the lab work is fine. ? ?

## 2021-05-25 NOTE — Telephone Encounter (Signed)
I called patient. I left a detailed message on home phone number, per DPR, advising her that labs were fine and to call the office with any questions or concerns. ?

## 2021-06-03 ENCOUNTER — Ambulatory Visit (INDEPENDENT_AMBULATORY_CARE_PROVIDER_SITE_OTHER): Payer: Medicare Other | Admitting: Rheumatology

## 2021-06-03 ENCOUNTER — Encounter: Payer: Self-pay | Admitting: Rheumatology

## 2021-06-03 VITALS — BP 117/67 | HR 84 | Ht 61.5 in | Wt 129.6 lb

## 2021-06-03 DIAGNOSIS — M7061 Trochanteric bursitis, right hip: Secondary | ICD-10-CM

## 2021-06-03 DIAGNOSIS — R0789 Other chest pain: Secondary | ICD-10-CM

## 2021-06-03 DIAGNOSIS — Z79899 Other long term (current) drug therapy: Secondary | ICD-10-CM

## 2021-06-03 DIAGNOSIS — M0579 Rheumatoid arthritis with rheumatoid factor of multiple sites without organ or systems involvement: Secondary | ICD-10-CM | POA: Diagnosis not present

## 2021-06-03 DIAGNOSIS — F5101 Primary insomnia: Secondary | ICD-10-CM | POA: Diagnosis not present

## 2021-06-03 DIAGNOSIS — M5030 Other cervical disc degeneration, unspecified cervical region: Secondary | ICD-10-CM

## 2021-06-03 DIAGNOSIS — Z853 Personal history of malignant neoplasm of breast: Secondary | ICD-10-CM

## 2021-06-03 DIAGNOSIS — Z8669 Personal history of other diseases of the nervous system and sense organs: Secondary | ICD-10-CM | POA: Diagnosis not present

## 2021-06-03 DIAGNOSIS — Z85828 Personal history of other malignant neoplasm of skin: Secondary | ICD-10-CM

## 2021-06-03 DIAGNOSIS — Z8709 Personal history of other diseases of the respiratory system: Secondary | ICD-10-CM | POA: Diagnosis not present

## 2021-06-03 DIAGNOSIS — K219 Gastro-esophageal reflux disease without esophagitis: Secondary | ICD-10-CM

## 2021-06-03 DIAGNOSIS — E559 Vitamin D deficiency, unspecified: Secondary | ICD-10-CM | POA: Diagnosis not present

## 2021-06-03 DIAGNOSIS — Z8589 Personal history of malignant neoplasm of other organs and systems: Secondary | ICD-10-CM

## 2021-06-03 DIAGNOSIS — M810 Age-related osteoporosis without current pathological fracture: Secondary | ICD-10-CM | POA: Diagnosis not present

## 2021-06-03 DIAGNOSIS — M51369 Other intervertebral disc degeneration, lumbar region without mention of lumbar back pain or lower extremity pain: Secondary | ICD-10-CM

## 2021-06-03 DIAGNOSIS — M5136 Other intervertebral disc degeneration, lumbar region: Secondary | ICD-10-CM | POA: Diagnosis not present

## 2021-06-03 NOTE — Addendum Note (Signed)
Addended by: Francis Gaines C on: 06/03/2021 10:16 AM ? ? Modules accepted: Orders ? ?

## 2021-06-03 NOTE — Patient Instructions (Addendum)

## 2021-06-05 LAB — QUANTIFERON-TB GOLD PLUS
Mitogen-NIL: >10 IU/mL
NIL: 0.03 IU/mL
QuantiFERON-TB Gold Plus: NEGATIVE
TB1-NIL: 0 IU/mL
TB2-NIL: 0 IU/mL

## 2021-06-07 NOTE — Progress Notes (Signed)
TB Gold is negative.

## 2021-06-15 DIAGNOSIS — Z20822 Contact with and (suspected) exposure to covid-19: Secondary | ICD-10-CM | POA: Diagnosis not present

## 2021-06-16 DIAGNOSIS — M0519 Rheumatoid lung disease with rheumatoid arthritis of multiple sites: Secondary | ICD-10-CM | POA: Diagnosis not present

## 2021-07-02 ENCOUNTER — Ambulatory Visit: Payer: Medicare Other | Admitting: Cardiology

## 2021-07-02 ENCOUNTER — Encounter: Payer: Self-pay | Admitting: Cardiology

## 2021-07-02 VITALS — BP 107/68 | HR 67 | Temp 98.3°F | Resp 16 | Ht 61.5 in | Wt 130.8 lb

## 2021-07-02 DIAGNOSIS — R002 Palpitations: Secondary | ICD-10-CM

## 2021-07-02 DIAGNOSIS — E559 Vitamin D deficiency, unspecified: Secondary | ICD-10-CM | POA: Diagnosis not present

## 2021-07-02 DIAGNOSIS — I700 Atherosclerosis of aorta: Secondary | ICD-10-CM

## 2021-07-02 DIAGNOSIS — Z87891 Personal history of nicotine dependence: Secondary | ICD-10-CM

## 2021-07-02 DIAGNOSIS — E7800 Pure hypercholesterolemia, unspecified: Secondary | ICD-10-CM | POA: Diagnosis not present

## 2021-07-02 DIAGNOSIS — R0609 Other forms of dyspnea: Secondary | ICD-10-CM

## 2021-07-02 MED ORDER — VITAMIN D-3 125 MCG (5000 UT) PO TABS: 1.0000 | ORAL_TABLET | Freq: Every day | ORAL | 3 refills | Status: DC

## 2021-07-02 MED ORDER — ASPIRIN 81 MG PO TBEC: 81.0000 mg | DELAYED_RELEASE_TABLET | Freq: Every day | ORAL | 3 refills | Status: AC

## 2021-07-02 NOTE — Progress Notes (Signed)
Primary Physician/Referring:  Kelton Pillar, MD  Patient ID: Kristy Perez, female    DOB: [birth year], 71 y.o.   MRN: [id number]  Chief Complaint  Patient presents with   Chest discomfort   New Patient (Initial Visit)    Referred by Dr. Bo Merino   HPI:    Kristy Perez  is a 71 y.o. Caucasian female patient with seropositive rheumatoid arthritis,  GERD and chronic dyspnea on exertion due to reactive airway disease and COPD, with >40-pack-year history of smoking quit since 2001, history of left breast cancer status post lumpectomy followed by chemo and radiation therapy in 2003, is referred to me for evaluation of chest discomfort.  On questioning, patient feels like the heart shuts down when she is doing routine activities at home and now it happens when she goes for a walk.  It last a few seconds only.  She has also noticed gradually worsening dyspnea.  She has >40-pack-year history of smoking, quit in 2001, but has noticed over the past 6 to 8 months, doing routine activities she gets markedly winded and thinks that she may need to change her inhalers.  No PND or orthopnea.  No other associated symptoms with dyspnea.  Past Medical History:  Diagnosis Date   Anemia    Asthma    inhaler one x per day   Cancer [facility])    breast, left   Chiari malformation    Difficult intubation    cannot hyperextend neck due to Chiari malformation with tonsillar involvement, RA   Dysrhythmia 2015   history of PAC's   GERD (gastroesophageal reflux disease)    High cholesterol    dx by PCP per patient    PONV (postoperative nausea and vomiting)    Rheumatoid arthritis ([place])    on Humira   Past Surgical History:  Procedure Laterality Date   BREAST SURGERY Left 2003   Lumpectomy T1C1, NO and negative ER/ PR   COLONOSCOPY  10/04   COLONOSCOPY  11/10   normal recheck in 5 years   COLPORRHAPHY     ESOPHAGOGASTRODUODENOSCOPY ENDOSCOPY  11/10   GERD   FOOT SURGERY Left age 72    removal of pseudo rheumatoid nodules from left forefoot.   LAPAROSCOPIC BILATERAL SALPINGO OOPHERECTOMY N/A 07/30/2013   Procedure: LAPAROSCOPIC BILATERAL SALPINGO OOPHORECTOMY WITH WASHINGS;  Surgeon: Lyman Speller, MD;  Location: [facility];  Service: Gynecology;  Laterality: N/A;   LUMBAR LAMINECTOMY/DECOMPRESSION MICRODISCECTOMY Right 04/06/2018   Procedure: Extraforaminal Microdiscectomy - Lumbar Two-Lumbar Three - right;  Surgeon: Eustace Heacox, MD;  Location: [place];  Service: Neurosurgery;  Laterality: Right;  Extraforaminal Microdiscectomy - Lumbar Two-Lumbar Three - right   NASAL SEPTOPLASTY W/ TURBINOPLASTY Bilateral 05/24/2014   Procedure: NASAL SEPTOPLASTY WITH  BILATERAL TURBINATE REDUCTION;  Surgeon: Jerrell Belfast, MD;  Location: [facility];  Service: ENT;  Laterality: Bilateral;   PELVIC FLOOR REPAIR  3/04   [place] with AP colporrhaphy   PILONIDAL CYST EXCISION     TOTAL VAGINAL HYSTERECTOMY  1989   with AP repair   Family History  Problem Relation Age of Onset   Diabetes Mother    Heart failure Mother    Bladder Cancer Mother    Stroke Mother    Cancer Father        head and neck   Uterine cancer Maternal Aunt    Bladder Cancer Maternal Grandmother    Colon cancer Maternal Grandmother     Social History   Tobacco  Use   Smoking status: Former    Packs/day: 1.50    Years: 30.00    Pack years: 45.00    Types: Cigarettes    Quit date: 02/16/1999    Years since quitting: 22.3    Passive exposure: Past   Smokeless tobacco: Never  Substance Use Topics   Alcohol use: No   Marital Status: Divorced  ROS  Review of Systems  Cardiovascular:  Positive for dyspnea on exertion and palpitations. Negative for chest pain and leg swelling.  Musculoskeletal:  Positive for arthritis.  Objective  Blood pressure 107/68, pulse 67, temperature 98.3 F (36.8 C), temperature source Temporal, resp. rate 16, height 5' 1.5" (1.562 m), weight 130 lb 12.8 oz (59.3 kg), last menstrual period  02/15/1985, SpO2 98 %. Body mass index is 24.31 kg/m.     07/02/2021    9:42 AM 06/03/2021    9:15 AM 04/13/2021   10:47 AM  Vitals with BMI  Height 5' 1.5" 5' 1.5" 5' 1.5"  Weight 130 lbs 13 oz 129 lbs 10 oz 125 lbs  BMI 24.32 02.54 27.06  Systolic 237 628 315  Diastolic 68 67 60  Pulse 67 84 78    Physical Exam Neck:     Vascular: No JVD.  Cardiovascular:     Rate and Rhythm: Normal rate and regular rhythm.     Pulses: Intact distal pulses.     Heart sounds: Normal heart sounds. No murmur heard.   No gallop.  Pulmonary:     Effort: Pulmonary effort is normal.     Breath sounds: Normal breath sounds.  Abdominal:     General: Bowel sounds are normal.     Palpations: Abdomen is soft.  Musculoskeletal:     Right lower leg: No edema.     Left lower leg: No edema.    Medications and allergies   Allergies  Allergen Reactions   Fish Allergy Anaphylaxis   Iodine Anaphylaxis    " PER PT IV CONTRAST"   Boniva [Ibandronic Acid]     vomiting    Erythromycin     UNSPECIFIED REACTION    Fosamax [Alendronate]     vomiting    Paxlovid [Nirmatrelvir-Ritonavir]    Sulfonamide Derivatives Other (See Comments)    Tongue turns black.     Medication list after today's encounter   Current Outpatient Medications:    Abatacept (ORENCIA IV), Inject 500 mg into the vein every 30 (thirty) days. Infuse 2 vials / 500 mg over 30 minutes every 4 weeks, Disp: , Rfl:    acetaminophen (TYLENOL) 325 MG tablet, Take 650 mg by mouth daily as needed (One hour prior to infusion). , Disp: , Rfl:    ADVAIR HFA 45-21 MCG/ACT inhaler, SMARTSIG:2 Puff(s) By Mouth Twice Daily, Disp: , Rfl:    albuterol (VENTOLIN HFA) 108 (90 Base) MCG/ACT inhaler, Inhale 2 puffs into the lungs every 6 (six) hours as needed for wheezing or shortness of breath., Disp: , Rfl:    amphetamine-dextroamphetamine (ADDERALL) 5 MG tablet, Take 10 mg by mouth daily as needed (stress). , Disp: , Rfl: 0   aspirin EC 81 MG tablet,  Take 1 tablet (81 mg total) by mouth daily., Disp: 90 tablet, Rfl: 3   chlorpheniramine (CHLOR-TRIMETON) 4 MG tablet, Take 4 mg by mouth daily as needed for allergies., Disp: , Rfl:    Cholecalciferol (VITAMIN D-3) 125 MCG (5000 UT) TABS, Take 1 tablet by mouth daily., Disp: 90 tablet, Rfl: 3   diphenhydrAMINE (  BENADRYL) 25 mg capsule, Take 25 mg by mouth daily as needed (One hour prior to infusion). , Disp: , Rfl:    famotidine (PEPCID) 20 MG tablet, One after  bfast and supper, Disp: 60 tablet, Rfl: 11   ibuprofen (ADVIL,MOTRIN) 200 MG tablet, Take 400 mg by mouth every 6 (six) hours as needed (for inflammation)., Disp: , Rfl:    Liniments (SALONPAS EX), Apply topically as needed., Disp: , Rfl:    Magnesium 250 MG TABS, Take 500-1,000 tablets by mouth daily. , Disp: , Rfl:    NONFORMULARY OR COMPOUNDED ITEM, Estradiol 0.02% vaginal cream.  Place 1/2 gram vaginally two to three times weekly at bedtime. Disp:  3 month supply (Patient taking differently: Place 0.5 Applicatorfuls vaginally once a week. Estradiol 0.02% vaginal cream.  Place 1/2 gram vaginally two to three times weekly at bedtime. Disp:  3 month supply), Disp: 1 each, Rfl: 3   Omega-3 1000 MG CAPS, Take 1 capsule by mouth daily. Plant based, Disp: , Rfl:    promethazine (PHENERGAN) 25 MG tablet, Take 25 mg by mouth every 6 (six) hours as needed for nausea or vomiting., Disp: , Rfl:    RESTASIS 0.05 % ophthalmic emulsion, Place 1 drop into both eyes every morning. , Disp: , Rfl:    rosuvastatin (CRESTOR) 5 MG tablet, Take 5 mg by mouth every other day., Disp: , Rfl:    Sodium Fluoride 1.1 % PSTE, Place 1 application onto teeth 2 (two) times daily. , Disp: , Rfl:    temazepam (RESTORIL) 30 MG capsule, Take 30 mg by mouth at bedtime. , Disp: , Rfl:   Laboratory examination:   Recent Labs    01/05/21 0934 03/16/21 1433 05/20/21 1226  NA 139 139 138  K 4.1 4.0 3.9  CL 102 104 101  CO2 20 21 19*  GLUCOSE 86 152* 141*  BUN '11 13 15   '$ CREATININE 0.51* 0.51* 0.51*  CALCIUM 9.2 8.7 9.4   CrCl cannot be calculated (Patient's most recent lab result is older than the maximum 21 days allowed.).     Latest Ref Rng & Units 05/20/2021   12:26 PM 03/16/2021    2:33 PM 01/05/2021    9:34 AM  CMP  Glucose 70 - 99 mg/dL 141   152   86    BUN 8 - 27 mg/dL '15   13   11    '$ Creatinine 0.57 - 1.00 mg/dL 0.51   0.51   0.51    Sodium 134 - 144 mmol/L 138   139   139    Potassium 3.5 - 5.2 mmol/L 3.9   4.0   4.1    Chloride 96 - 106 mmol/L 101   104   102    CO2 20 - 29 mmol/L '19   21   20    '$ Calcium 8.7 - 10.3 mg/dL 9.4   8.7   9.2    Total Protein 6.0 - 8.5 g/dL 6.2   6.2   6.4    Total Bilirubin 0.0 - 1.2 mg/dL 0.2   <0.2   0.3    Alkaline Phos 44 - 121 IU/L 75   72   69    AST 0 - 40 IU/L '18   13   17    '$ ALT 0 - 32 IU/L '18   14   15        '$ Latest Ref Rng & Units 05/20/2021   12:26 PM 03/16/2021  2:33 PM 01/05/2021    9:34 AM  CBC  WBC 3.4 - 10.8 x10E3/uL 7.5   7.6   7.5    Hemoglobin 11.1 - 15.9 g/dL 12.8   11.7   13.4    Hematocrit 34.0 - 46.6 % 38.1   34.8   40.7    Platelets 150 - 450 x10E3/uL 357   394   387     TSH No results for input(s): TSH in the last 8760 hours.  Vitamin D 06/2019: 20.4  External labs:   Cholesterol, total 261.000 m 01/30/2021 HDL 64.000 mg 01/30/2021 LDL 176.000 m 01/30/2021 Triglycerides 120.000 m 01/30/2021  A1C 5.700 % 01/30/2021  Radiology:  Chest x-ray PA lateral view 10/11/2018: The heart size and mediastinal contours are within normal limits. Aortic atherosclerosis. Both lungs are clear. The visualized skeletal structures are unremarkable. Surgical clips noted in left axilla.  Cardiac Studies:    EKG:   EKG 07/02/2021: Normal sinus rhythm at the rate of 72 bpm, normal axis, incomplete right bundle branch block.  Otherwise normal EKG.    Assessment     ICD-10-CM   1. Palpitation  R00.2 EKG 12-Lead    2. Dyspnea on exertion  R06.09 CT CHEST LUNG CANCER SCREENING LOW  DOSE WO CONTRAST    PCV ECHOCARDIOGRAM COMPLETE    PCV MYOCARDIAL PERFUSION WO LEXISCAN    3. Aortic atherosclerosis (HCC)  I70.0 aspirin EC 81 MG tablet    PCV MYOCARDIAL PERFUSION WO LEXISCAN    4. Hypercholesteremia  E78.00     5. Stopped smoking with greater than 40 pack year history  Z87.891 CT CHEST LUNG CANCER SCREENING LOW DOSE WO CONTRAST    6. Hypovitaminosis D  E55.9 Cholecalciferol (VITAMIN D-3) 125 MCG (5000 UT) TABS       Medications Discontinued During This Encounter  Medication Reason   Cholecalciferol (VITAMIN D) 50 MCG (2000 UT) CAPS Dose change   amoxicillin (AMOXIL) 500 MG capsule Completed Course   Vitamin D, Ergocalciferol, (DRISDOL) 1.25 MG (50000 UNIT) CAPS capsule Completed Course    Meds ordered this encounter  Medications   aspirin EC 81 MG tablet    Sig: Take 1 tablet (81 mg total) by mouth daily.    Dispense:  90 tablet    Refill:  3   Cholecalciferol (VITAMIN D-3) 125 MCG (5000 UT) TABS    Sig: Take 1 tablet by mouth daily.    Dispense:  90 tablet    Refill:  3   Orders Placed This Encounter  Procedures   CT CHEST LUNG CANCER SCREENING LOW DOSE WO CONTRAST    Standing Status:   Future    Standing Expiration Date:   07/03/2022    Order Specific Question:   Preferred Imaging Location?    Answer:   [facility]   PCV MYOCARDIAL PERFUSION WO LEXISCAN    Standing Status:   Future    Standing Expiration Date:   09/01/2021   EKG 12-Lead   PCV ECHOCARDIOGRAM COMPLETE    Standing Status:   Future    Standing Expiration Date:   07/03/2022   Recommendations:   Kristy Perez is a 71 y.o.  Caucasian female patient with seropositive rheumatoid arthritis,  GERD and chronic dyspnea on exertion due to reactive airway disease and COPD, with >40-pack-year history of smoking quit since 2001, history of left breast cancer status post lumpectomy followed by chemo and radiation therapy in 2003, is referred to me for evaluation of chest discomfort.  Her  symptoms are clearly palpitations, these represent PVCs.  Most of these are occurring during rest and usual activities at home than with exertion activity.  Her significant cardiovascular risk factors include aortic atherosclerosis noted on the chest x-ray, untreated hypercholesterolemia, she started statins January 2023, repeat labs are pending.  She also has connective tissue disease and >40-pack-year history of smoking, radiation therapy to the chest which increases the risk of proximal major vessel coronary artery disease, she will need echocardiogram to evaluate for pulmonary hypertension to evaluate her dyspnea and also a nuclear stress test to exclude ischemic heart disease as she is extremely high risk clinically.  She will need low-dose CT scan of the chest in view of her smoking history and also prior history of breast cancer and radiation therapy.  Her physical examination today is unremarkable.  I will see her back after the test and make further recommendations.  I would recommend that she be on aspirin 81 mg daily for prophylaxis.  She also has severe hypovitaminosis D, used 50,000 units of vitamin D for 6 weeks last year but has not been on any supplements, states that she continues to have low vitamin D levels and she is presently taking 2000 units of vitamin D.  Advised her to increase it to 5000 units, she is scheduled for repeat labs sometime in July with Dr. Kelton Pillar.  She will also need a TSH at that time.    Adrian Prows, MD, [facility] 07/02/2021, 11:18 AM Office: [phone]

## 2021-07-09 ENCOUNTER — Ambulatory Visit: Payer: Medicare Other

## 2021-07-09 DIAGNOSIS — R0609 Other forms of dyspnea: Secondary | ICD-10-CM

## 2021-07-15 ENCOUNTER — Other Ambulatory Visit (INDEPENDENT_AMBULATORY_CARE_PROVIDER_SITE_OTHER): Payer: Self-pay

## 2021-07-15 ENCOUNTER — Other Ambulatory Visit: Payer: Self-pay | Admitting: *Deleted

## 2021-07-15 DIAGNOSIS — M0519 Rheumatoid lung disease with rheumatoid arthritis of multiple sites: Secondary | ICD-10-CM | POA: Diagnosis not present

## 2021-07-15 DIAGNOSIS — Z79899 Other long term (current) drug therapy: Secondary | ICD-10-CM | POA: Diagnosis not present

## 2021-07-15 DIAGNOSIS — M0579 Rheumatoid arthritis with rheumatoid factor of multiple sites without organ or systems involvement: Secondary | ICD-10-CM

## 2021-07-15 DIAGNOSIS — Z0289 Encounter for other administrative examinations: Secondary | ICD-10-CM

## 2021-07-16 ENCOUNTER — Telehealth: Payer: Self-pay | Admitting: *Deleted

## 2021-07-16 LAB — CBC WITH DIFFERENTIAL/PLATELET
Basophils Absolute: 0 10*3/uL (ref 0.0–0.2)
Basos: 1 %
EOS (ABSOLUTE): 0.3 10*3/uL (ref 0.0–0.4)
Eos: 4 %
Hematocrit: 36.9 % (ref 34.0–46.6)
Hemoglobin: 12.4 g/dL (ref 11.1–15.9)
Immature Grans (Abs): 0 10*3/uL (ref 0.0–0.1)
Immature Granulocytes: 0 %
Lymphocytes Absolute: 2.4 10*3/uL (ref 0.7–3.1)
Lymphs: 30 %
MCH: 28.8 pg (ref 26.6–33.0)
MCHC: 33.6 g/dL (ref 31.5–35.7)
MCV: 86 fL (ref 79–97)
Monocytes Absolute: 0.5 10*3/uL (ref 0.1–0.9)
Monocytes: 6 %
Neutrophils Absolute: 4.7 10*3/uL (ref 1.4–7.0)
Neutrophils: 59 %
Platelets: 339 10*3/uL (ref 150–450)
RBC: 4.3 x10E6/uL (ref 3.77–5.28)
RDW: 13 % (ref 11.7–15.4)
WBC: 8 10*3/uL (ref 3.4–10.8)

## 2021-07-16 LAB — COMPREHENSIVE METABOLIC PANEL
ALT: 19 IU/L (ref 0–32)
AST: 24 IU/L (ref 0–40)
Albumin/Globulin Ratio: 2 (ref 1.2–2.2)
Albumin: 4.3 g/dL (ref 3.8–4.8)
Alkaline Phosphatase: 71 IU/L (ref 44–121)
BUN/Creatinine Ratio: 18 (ref 12–28)
BUN: 11 mg/dL (ref 8–27)
Bilirubin Total: 0.4 mg/dL (ref 0.0–1.2)
CO2: 20 mmol/L (ref 20–29)
Calcium: 9.3 mg/dL (ref 8.7–10.3)
Chloride: 99 mmol/L (ref 96–106)
Creatinine, Ser: 0.61 mg/dL (ref 0.57–1.00)
Globulin, Total: 2.1 g/dL (ref 1.5–4.5)
Glucose: 133 mg/dL — ABNORMAL HIGH (ref 70–99)
Potassium: 4.2 mmol/L (ref 3.5–5.2)
Sodium: 141 mmol/L (ref 134–144)
Total Protein: 6.4 g/dL (ref 6.0–8.5)
eGFR: 97 mL/min/{1.73_m2} (ref >59)

## 2021-07-16 NOTE — Telephone Encounter (Signed)
Called and LVM about lab results per Dr. Garth Bigness note. Advised she did not have to call back unless she had further questions.

## 2021-07-16 NOTE — Telephone Encounter (Signed)
-----   Message from Britt Bottom, MD sent at 07/16/2021  7:57 AM EDT ----- Please let the patient know that the lab work is fine.

## 2021-07-24 ENCOUNTER — Inpatient Hospital Stay: Admission: RE | Admit: 2021-07-24 | Payer: 59 | Source: Ambulatory Visit

## 2021-07-29 ENCOUNTER — Ambulatory Visit: Payer: Medicare Other

## 2021-07-29 DIAGNOSIS — I700 Atherosclerosis of aorta: Secondary | ICD-10-CM | POA: Diagnosis not present

## 2021-07-29 DIAGNOSIS — R0609 Other forms of dyspnea: Secondary | ICD-10-CM | POA: Diagnosis not present

## 2021-08-02 LAB — PCV MYOCARDIAL PERFUSION WO LEXISCAN: ST Depression (mm): 0 mm

## 2021-08-12 DIAGNOSIS — M0519 Rheumatoid lung disease with rheumatoid arthritis of multiple sites: Secondary | ICD-10-CM | POA: Diagnosis not present

## 2021-08-14 ENCOUNTER — Encounter: Payer: Self-pay | Admitting: Cardiology

## 2021-08-14 ENCOUNTER — Ambulatory Visit: Payer: Medicare Other | Admitting: Cardiology

## 2021-08-14 VITALS — BP 115/57 | HR 65 | Temp 97.5°F | Resp 16 | Ht 61.5 in | Wt 129.8 lb

## 2021-08-14 DIAGNOSIS — I700 Atherosclerosis of aorta: Secondary | ICD-10-CM

## 2021-08-14 DIAGNOSIS — E7800 Pure hypercholesterolemia, unspecified: Secondary | ICD-10-CM | POA: Diagnosis not present

## 2021-08-14 DIAGNOSIS — R0609 Other forms of dyspnea: Secondary | ICD-10-CM

## 2021-08-14 NOTE — Progress Notes (Signed)
Primary Physician/Referring:  Kelton Pillar, MD  Patient ID: Kristy Perez, female    DOB: 01-17-1951, 71 y.o.   MRN: [id number]  No chief complaint on file.  HPI:    Kristy Perez  is a 71 y.o. Caucasian female patient with seropositive rheumatoid arthritis,  GERD and chronic dyspnea on exertion due to reactive airway disease and COPD, with >40-pack-year history of smoking quit since 2001, history of left breast cancer status post lumpectomy followed by chemo and radiation therapy in 2003.   Patient originally referred for evaluation of palpitations, which are suggestive of PVCs. Given multiple cardiovascular risk factors and history of smoking ordered echocardiogram, stress test, and lung cancer screening CT at last office visit. Results of the CT are pending. Echo revealed preserved LVEF with mild to moderate MR and TR. Stress test was overall low risk. Patient now presents for 6 week follow.  Patient's dyspnea remains stable.  She also continues to have occasional episodes of palpitations, unchanged compared to previous.  Past Medical History:  Diagnosis Date   Anemia    Asthma    inhaler one x per day   Cancer [facility])    breast, left   Chiari malformation    Difficult intubation    cannot hyperextend neck due to Chiari malformation with tonsillar involvement, RA   Dysrhythmia 2015   history of PAC's   GERD (gastroesophageal reflux disease)    High cholesterol    dx by PCP per patient    PONV (postoperative nausea and vomiting)    Rheumatoid arthritis ([place])    on Humira   Past Surgical History:  Procedure Laterality Date   BREAST SURGERY Left 2003   Lumpectomy T1C1, NO and negative ER/ PR   COLONOSCOPY  10/04   COLONOSCOPY  11/10   normal recheck in 5 years   COLPORRHAPHY     ESOPHAGOGASTRODUODENOSCOPY ENDOSCOPY  11/10   GERD   FOOT SURGERY Left age 71   removal of pseudo rheumatoid nodules from left forefoot.   LAPAROSCOPIC BILATERAL SALPINGO OOPHERECTOMY N/A  07/30/2013   Procedure: LAPAROSCOPIC BILATERAL SALPINGO OOPHORECTOMY WITH WASHINGS;  Surgeon: Lyman Speller, MD;  Location: [place] [facility];  Service: Gynecology;  Laterality: N/A;   LUMBAR LAMINECTOMY/DECOMPRESSION MICRODISCECTOMY Right 04/06/2018   Procedure: Extraforaminal Microdiscectomy - Lumbar Two-Lumbar Three - right;  Surgeon: Eustace Salvato, MD;  Location: [place];  Service: Neurosurgery;  Laterality: Right;  Extraforaminal Microdiscectomy - Lumbar Two-Lumbar Three - right   NASAL SEPTOPLASTY W/ TURBINOPLASTY Bilateral 05/24/2014   Procedure: NASAL SEPTOPLASTY WITH  BILATERAL TURBINATE REDUCTION;  Surgeon: Jerrell Belfast, MD;  Location: [facility];  Service: ENT;  Laterality: Bilateral;   PELVIC FLOOR REPAIR  3/04   Luis Lopez with AP colporrhaphy   PILONIDAL CYST EXCISION     TOTAL VAGINAL HYSTERECTOMY  1989   with AP repair   Family History  Problem Relation Age of Onset   Diabetes Mother    Heart failure Mother    Bladder Cancer Mother    Stroke Mother    Cancer Father        head and neck   Uterine cancer Maternal Aunt    Bladder Cancer Maternal Grandmother    Colon cancer Maternal Grandmother     Social History   Tobacco Use   Smoking status: Former    Packs/day: 1.50    Years: 30.00    Total pack years: 45.00    Types: Cigarettes    Quit date: 02/16/1999  Years since quitting: 22.5    Passive exposure: Past   Smokeless tobacco: Never  Substance Use Topics   Alcohol use: No   Marital Status: Divorced  ROS  Review of Systems  Cardiovascular:  Positive for dyspnea on exertion (stable) and palpitations (stable). Negative for chest pain and leg swelling.  Musculoskeletal:  Positive for arthritis.   Objective  Blood pressure (!) 115/57, pulse 65, temperature (!) 97.5 F (36.4 C), temperature source Temporal, resp. rate 16, height 5' 1.5" (1.562 m), weight 129 lb 12.8 oz (58.9 kg), last menstrual period 02/15/1985, SpO2 95 %. Body mass index is 24.13 kg/m.     08/14/2021     9:05 AM 07/02/2021    9:42 AM 06/03/2021    9:15 AM  Vitals with BMI  Height 5' 1.5" 5' 1.5" 5' 1.5"  Weight 129 lbs 13 oz 130 lbs 13 oz 129 lbs 10 oz  BMI 24.13 60.63 01.60  Systolic 109 323 557  Diastolic 57 68 67  Pulse 65 67 84    Physical Exam Vitals reviewed.  Neck:     Vascular: No JVD.  Cardiovascular:     Rate and Rhythm: Normal rate and regular rhythm.     Pulses: Intact distal pulses.     Heart sounds: Normal heart sounds. No murmur heard.    No gallop.  Pulmonary:     Effort: Pulmonary effort is normal.     Breath sounds: Normal breath sounds.  Musculoskeletal:     Right lower leg: No edema.     Left lower leg: No edema.     Allergies   Allergies  Allergen Reactions   Fish Allergy Anaphylaxis   Iodine Anaphylaxis    " PER PT IV CONTRAST"   Boniva [Ibandronic Acid]     vomiting    Fosamax [Alendronate]     vomiting    Paxlovid [Nirmatrelvir-Ritonavir]    Sulfonamide Derivatives Other (See Comments)    Tongue turns black.     Medication list after today's encounter   Current Outpatient Medications:    Abatacept (ORENCIA IV), Inject 500 mg into the vein every 30 (thirty) days. Infuse 2 vials / 500 mg over 30 minutes every 4 weeks, Disp: , Rfl:    acetaminophen (TYLENOL) 325 MG tablet, Take 650 mg by mouth daily as needed (One hour prior to infusion). , Disp: , Rfl:    ADVAIR HFA 45-21 MCG/ACT inhaler, SMARTSIG:2 Puff(s) By Mouth Twice Daily, Disp: , Rfl:    albuterol (VENTOLIN HFA) 108 (90 Base) MCG/ACT inhaler, Inhale 2 puffs into the lungs every 6 (six) hours as needed for wheezing or shortness of breath., Disp: , Rfl:    amphetamine-dextroamphetamine (ADDERALL) 5 MG tablet, Take 10 mg by mouth daily as needed (stress). , Disp: , Rfl: 0   aspirin EC 81 MG tablet, Take 1 tablet (81 mg total) by mouth daily., Disp: 90 tablet, Rfl: 3   chlorpheniramine (CHLOR-TRIMETON) 4 MG tablet, Take 4 mg by mouth daily as needed for allergies., Disp: , Rfl:     Cholecalciferol (VITAMIN D-3) 125 MCG (5000 UT) TABS, Take 1 tablet by mouth daily., Disp: 90 tablet, Rfl: 3   diphenhydrAMINE (BENADRYL) 25 mg capsule, Take 25 mg by mouth daily as needed (One hour prior to infusion). , Disp: , Rfl:    famotidine (PEPCID) 20 MG tablet, One after  bfast and supper, Disp: 60 tablet, Rfl: 11   ibuprofen (ADVIL,MOTRIN) 200 MG tablet, Take 400 mg by mouth every 6 (  six) hours as needed (for inflammation)., Disp: , Rfl:    Liniments (SALONPAS EX), Apply topically as needed., Disp: , Rfl:    Magnesium 250 MG TABS, Take 500-1,000 tablets by mouth daily. , Disp: , Rfl:    NONFORMULARY OR COMPOUNDED ITEM, Estradiol 0.02% vaginal cream.  Place 1/2 gram vaginally two to three times weekly at bedtime. Disp:  3 month supply (Patient taking differently: Place 0.5 Applicatorfuls vaginally once a week. Estradiol 0.02% vaginal cream.  Place 1/2 gram vaginally two to three times weekly at bedtime. Disp:  3 month supply), Disp: 1 each, Rfl: 3   promethazine (PHENERGAN) 25 MG tablet, Take 25 mg by mouth every 6 (six) hours as needed for nausea or vomiting., Disp: , Rfl:    RESTASIS 0.05 % ophthalmic emulsion, Place 1 drop into both eyes every morning. , Disp: , Rfl:    rosuvastatin (CRESTOR) 5 MG tablet, Take 5 mg by mouth every other day., Disp: , Rfl:    Sodium Fluoride 1.1 % PSTE, Place 1 application onto teeth 2 (two) times daily. , Disp: , Rfl:    temazepam (RESTORIL) 30 MG capsule, Take 30 mg by mouth at bedtime. , Disp: , Rfl:   Laboratory examination:   Recent Labs    03/16/21 1433 05/20/21 1226 07/15/21 1307  NA 139 138 141  K 4.0 3.9 4.2  CL 104 101 99  CO2 21 19* 20  GLUCOSE 152* 141* 133*  BUN '13 15 11  '$ CREATININE 0.51* 0.51* 0.61  CALCIUM 8.7 9.4 9.3   CrCl cannot be calculated (Patient's most recent lab result is older than the maximum 21 days allowed.).     Latest Ref Rng & Units 07/15/2021    1:07 PM 05/20/2021   12:26 PM 03/16/2021    2:33 PM  CMP   Glucose 70 - 99 mg/dL 133  141  152   BUN 8 - 27 mg/dL '11  15  13   '$ Creatinine 0.57 - 1.00 mg/dL 0.61  0.51  0.51   Sodium 134 - 144 mmol/L 141  138  139   Potassium 3.5 - 5.2 mmol/L 4.2  3.9  4.0   Chloride 96 - 106 mmol/L 99  101  104   CO2 20 - 29 mmol/L '20  19  21   '$ Calcium 8.7 - 10.3 mg/dL 9.3  9.4  8.7   Total Protein 6.0 - 8.5 g/dL 6.4  6.2  6.2   Total Bilirubin 0.0 - 1.2 mg/dL 0.4  0.2  <0.2   Alkaline Phos 44 - 121 IU/L 71  75  72   AST 0 - 40 IU/L '24  18  13   '$ ALT 0 - 32 IU/L '19  18  14       '$ Latest Ref Rng & Units 07/15/2021    1:07 PM 05/20/2021   12:26 PM 03/16/2021    2:33 PM  CBC  WBC 3.4 - 10.8 x10E3/uL 8.0  7.5  7.6   Hemoglobin 11.1 - 15.9 g/dL 12.4  12.8  11.7   Hematocrit 34.0 - 46.6 % 36.9  38.1  34.8   Platelets 150 - 450 x10E3/uL 339  357  394    TSH No results for input(s): "TSH" in the last 8760 hours.  Vitamin D 06/2019: 20.4  External labs:   Cholesterol, total 261.000 m 01/30/2021 HDL 64.000 mg 01/30/2021 LDL 176.000 m 01/30/2021 Triglycerides 120.000 m 01/30/2021  A1C 5.700 % 01/30/2021  Radiology:  Chest x-ray PA lateral  view 10/11/2018: The heart size and mediastinal contours are within normal limits. Aortic atherosclerosis. Both lungs are clear. The visualized skeletal structures are unremarkable. Surgical clips noted in left axilla.  Cardiac Studies:  PCV ECHOCARDIOGRAM COMPLETE 07/09/2021 Left ventricle cavity is normal in size and wall thickness. Normal global wall motion. Normal LV systolic function with EF 60%. Normal diastolic filling pattern. Structurally normal trileaflet aortic valve.  Mild (Grade I) aortic regurgitation. Mild to moderate mitral regurgitation. Moderate tricuspid regurgitation. No evidence of pulmonary hypertension.   PCV MYOCARDIAL PERFUSION WO LEXISCAN 07/29/2021 Normal ECG stress. The patient exercised for 4 minutes and 17 seconds of a Bruce protocol, achieving approximately 6.17 METs. & 91% of MPHR. No  chest pain. Fatigue. Reduced exercise tolerance. Occasional PVC at peak exercise. Myocardial perfusion is normal. Overall LV systolic function is normal without regional wall motion abnormalities. Stress LV EF: 69%. No previous exam available for comparison. Low risk.  EKG:   EKG 07/02/2021: Normal sinus rhythm at the rate of 72 bpm, normal axis, incomplete right bundle branch block.  Otherwise normal EKG.    Assessment     ICD-10-CM   1. Aortic atherosclerosis (HCC)  I70.0 Lipid Panel With LDL/HDL Ratio    TSH    2. Hypercholesteremia  E78.00 Lipid Panel With LDL/HDL Ratio    TSH    3. Dyspnea on exertion  R06.09        Medications Discontinued During This Encounter  Medication Reason   Omega-3 1000 MG CAPS     No orders of the defined types were placed in this encounter.  Orders Placed This Encounter  Procedures   Lipid Panel With LDL/HDL Ratio   TSH   Recommendations:   Kristy Perez is a 71 y.o.  Caucasian female patient with seropositive rheumatoid arthritis,  GERD and chronic dyspnea on exertion due to reactive airway disease and COPD, with >40-pack-year history of smoking quit since 2001, history of left breast cancer status post lumpectomy followed by chemo and radiation therapy in 2003.   Patient originally referred for evaluation of palpitations, which are suggestive of PVCs. Given multiple cardiovascular risk factors and history of smoking ordered echocardiogram, stress test, and lung cancer screening CT at last office visit. Results of the CT are pending. Echo revealed preserved LVEF with mild to moderate MR and TR. Stress test was overall low risk. Patient now presents for 6 week follow.  Reviewed and discussed results of echocardiogram and stress test, details above.  Results of chest CT are pending, will call patient with these.  Lipids are uncontrolled at last check in December 2022, will repeat lipid profile testing as well as TSH.  She is tolerating addition of  low-dose aspirin, will continue this.  Patient's echocardiogram and stress test were reassuring.  Recommend continued aggressive risk factor modification.   Follow up in 1 year, sooner if needed.    Alethia Berthold, PA-C 08/14/2021, 9:56 AM Office: [phone]

## 2021-08-17 ENCOUNTER — Telehealth: Payer: Self-pay | Admitting: Rheumatology

## 2021-08-17 ENCOUNTER — Ambulatory Visit
Admission: RE | Admit: 2021-08-17 | Discharge: 2021-08-17 | Disposition: A | Discharge status: Home / Self Care | Payer: Medicare Other | Source: Ambulatory Visit | Attending: Cardiology | Admitting: Cardiology

## 2021-08-17 DIAGNOSIS — Z87891 Personal history of nicotine dependence: Secondary | ICD-10-CM

## 2021-08-17 DIAGNOSIS — R0609 Other forms of dyspnea: Secondary | ICD-10-CM

## 2021-08-17 NOTE — Telephone Encounter (Signed)
Patient left a voicemail requesting a copy of her TB test results faxed to the infusion suite at [facility].  If there are any questions, please call me back at [phone]

## 2021-08-17 NOTE — Telephone Encounter (Signed)
Faxed results.

## 2021-08-19 ENCOUNTER — Telehealth: Payer: Self-pay | Admitting: Student

## 2021-08-19 NOTE — Telephone Encounter (Signed)
[facility] was unable to do lung cancer screening CT as it was not approved by insurance.

## 2021-08-25 ENCOUNTER — Ambulatory Visit
Admission: RE | Admit: 2021-08-25 | Discharge: 2021-08-25 | Disposition: A | Discharge status: Home / Self Care | Payer: Medicare Other | Source: Ambulatory Visit | Attending: Physician Assistant | Admitting: Physician Assistant

## 2021-08-25 DIAGNOSIS — Z780 Asymptomatic menopausal state: Secondary | ICD-10-CM | POA: Diagnosis not present

## 2021-08-25 DIAGNOSIS — E559 Vitamin D deficiency, unspecified: Secondary | ICD-10-CM

## 2021-08-25 DIAGNOSIS — E7800 Pure hypercholesterolemia, unspecified: Secondary | ICD-10-CM | POA: Diagnosis not present

## 2021-08-25 DIAGNOSIS — Z1231 Encounter for screening mammogram for malignant neoplasm of breast: Secondary | ICD-10-CM | POA: Diagnosis not present

## 2021-08-25 DIAGNOSIS — M810 Age-related osteoporosis without current pathological fracture: Secondary | ICD-10-CM

## 2021-08-25 DIAGNOSIS — M8589 Other specified disorders of bone density and structure, multiple sites: Secondary | ICD-10-CM | POA: Diagnosis not present

## 2021-08-25 HISTORY — DX: Personal history of irradiation: Z92.3

## 2021-08-25 HISTORY — DX: Malignant neoplasm of unspecified site of unspecified female breast: C50.919

## 2021-08-25 NOTE — Progress Notes (Signed)
Recent bone density scan is in the osteopenia range.  We will discuss results at the follow-up visit.  Current recommendations are to take vitamin D and calcium on a regular basis.  Total calcium intake should be 1200 mg a day.  Most of the calcium should come from diet.

## 2021-08-26 ENCOUNTER — Other Ambulatory Visit: Payer: Self-pay | Admitting: Family Medicine

## 2021-08-26 ENCOUNTER — Other Ambulatory Visit: Payer: Self-pay | Admitting: Physician Assistant

## 2021-08-26 DIAGNOSIS — R928 Other abnormal and inconclusive findings on diagnostic imaging of breast: Secondary | ICD-10-CM

## 2021-08-26 NOTE — Progress Notes (Signed)
I did not order this mammogram.  Please forward the results to her PCP.  The impression by the radiologist states that there is a possible mass in the right breast, and would need ultrasound to evaluate this further.  Please notify patient.

## 2021-09-09 ENCOUNTER — Other Ambulatory Visit: Payer: Self-pay | Admitting: *Deleted

## 2021-09-09 ENCOUNTER — Other Ambulatory Visit (INDEPENDENT_AMBULATORY_CARE_PROVIDER_SITE_OTHER): Payer: Self-pay

## 2021-09-09 DIAGNOSIS — M0579 Rheumatoid arthritis with rheumatoid factor of multiple sites without organ or systems involvement: Secondary | ICD-10-CM

## 2021-09-09 DIAGNOSIS — Z0289 Encounter for other administrative examinations: Secondary | ICD-10-CM

## 2021-09-09 DIAGNOSIS — Z79899 Other long term (current) drug therapy: Secondary | ICD-10-CM | POA: Diagnosis not present

## 2021-09-09 DIAGNOSIS — M0519 Rheumatoid lung disease with rheumatoid arthritis of multiple sites: Secondary | ICD-10-CM | POA: Diagnosis not present

## 2021-09-10 ENCOUNTER — Telehealth: Payer: Self-pay | Admitting: *Deleted

## 2021-09-10 LAB — CBC WITH DIFFERENTIAL/PLATELET
Basophils Absolute: 0 10*3/uL (ref 0.0–0.2)
Basos: 1 %
EOS (ABSOLUTE): 0.4 10*3/uL (ref 0.0–0.4)
Eos: 5 %
Hematocrit: 37.6 % (ref 34.0–46.6)
Hemoglobin: 12.4 g/dL (ref 11.1–15.9)
Immature Grans (Abs): 0 10*3/uL (ref 0.0–0.1)
Immature Granulocytes: 0 %
Lymphocytes Absolute: 2 10*3/uL (ref 0.7–3.1)
Lymphs: 29 %
MCH: 28.3 pg (ref 26.6–33.0)
MCHC: 33 g/dL (ref 31.5–35.7)
MCV: 86 fL (ref 79–97)
Monocytes Absolute: 0.6 10*3/uL (ref 0.1–0.9)
Monocytes: 9 %
Neutrophils Absolute: 3.9 10*3/uL (ref 1.4–7.0)
Neutrophils: 56 %
Platelets: 354 10*3/uL (ref 150–450)
RBC: 4.38 x10E6/uL (ref 3.77–5.28)
RDW: 13.7 % (ref 11.7–15.4)
WBC: 7 10*3/uL (ref 3.4–10.8)

## 2021-09-10 LAB — COMPREHENSIVE METABOLIC PANEL
ALT: 15 IU/L (ref 0–32)
AST: 19 IU/L (ref 0–40)
Albumin/Globulin Ratio: 2.6 — ABNORMAL HIGH (ref 1.2–2.2)
Albumin: 4.6 g/dL (ref 3.9–4.9)
Alkaline Phosphatase: 70 IU/L (ref 44–121)
BUN/Creatinine Ratio: 25 (ref 12–28)
BUN: 13 mg/dL (ref 8–27)
Bilirubin Total: 0.4 mg/dL (ref 0.0–1.2)
CO2: 19 mmol/L — ABNORMAL LOW (ref 20–29)
Calcium: 9.3 mg/dL (ref 8.7–10.3)
Chloride: 101 mmol/L (ref 96–106)
Creatinine, Ser: 0.52 mg/dL — ABNORMAL LOW (ref 0.57–1.00)
Globulin, Total: 1.8 g/dL (ref 1.5–4.5)
Glucose: 105 mg/dL — ABNORMAL HIGH (ref 70–99)
Potassium: 4.4 mmol/L (ref 3.5–5.2)
Sodium: 138 mmol/L (ref 134–144)
Total Protein: 6.4 g/dL (ref 6.0–8.5)
eGFR: 101 mL/min/{1.73_m2} (ref >59)

## 2021-09-10 NOTE — Telephone Encounter (Signed)
Called and spoke with pt about lab results. Pt verbalized understanding.

## 2021-09-10 NOTE — Telephone Encounter (Signed)
-----   Message from Britt Bottom, MD sent at 09/10/2021  3:18 PM EDT ----- Please let the patient know that the lab work is fine.

## 2021-09-14 NOTE — Telephone Encounter (Signed)
Result mailed on 09/14/21

## 2021-10-06 ENCOUNTER — Telehealth: Payer: Self-pay | Admitting: Rheumatology

## 2021-10-06 DIAGNOSIS — Z23 Encounter for immunization: Secondary | ICD-10-CM | POA: Diagnosis not present

## 2021-10-06 NOTE — Telephone Encounter (Signed)
Returned call to patient. She inquired why she needs to hold Orencia if the implant site is not open. I advised that healing process is immunological and receiving Orencia prevent her appropriate and timely immunological process for healing. Advised that delayed healing can put her at risk for infection as well.  She has been advised to r/s infusion to be no sooner than 4 weeks after her implant surgery. She will reschedule for 6 weeks after procedure because she has an appt with dental surgeon at 4 weeks and wants to ensure no complication.  I advised that this is also acceptable if she is okay with being off of treatment for 6 weeks. All questions encouraged and answered.  Knox Saliva, PharmD, MPH, BCPS, CPP Clinical Pharmacist (Rheumatology and Pulmonology)

## 2021-10-06 NOTE — Telephone Encounter (Signed)
Patient called the office stating she had surgery to get dental implants yesterday had to cancel her upcoming Orencia infusion. Patient states she wants to speak to someone about when she can have her infusion. Patient states her surgeon told her a minimum of 4 weeks but she has questions as well.

## 2021-10-09 DIAGNOSIS — L820 Inflamed seborrheic keratosis: Secondary | ICD-10-CM | POA: Diagnosis not present

## 2021-10-09 DIAGNOSIS — L570 Actinic keratosis: Secondary | ICD-10-CM | POA: Diagnosis not present

## 2021-10-21 NOTE — Progress Notes (Signed)
Office Visit Note  [name]             Date of Birth: 08/18/1950           MRN: [id number]             PCP: Kelton Pillar, MD Referring: Kelton Pillar, MD Visit Date: 11/04/2021 Occupation: '@GUAROCC'$ @  Subjective:  Right trochanteric bursitis   History of Present Illness: CHIANTE PEDEN is a 71 y.o. female with history of seropositive rheumatoid arthritis, DDD, and osteoporosis.  Patient is prescribed Orencia IV infusion 500 mg every 28 days at [facility].  Patient had dental implant surgery on 10/05/2021.  She skipped her dose of Orencia in August and September but plans on resuming her Orencia infusions in October 2023.  She has not had any signs or symptoms of an infection or delayed healing. She has noticed some increased arthralgias since having to postpone her infusion but denies any joint swelling.  She states that her knee joints ache at night on occasion but she continues to walk on a daily basis for exercise.  She tries to get at least 10,000 steps per day.  She is having some increased discomfort in her right hip especially when lying on her right side at night.  She requested a right trochanteric bursa injection today which has alleviated her discomfort in the past. She had a recent follow-up with Dr. Einar Gip.  She had no medication changes.  She plans on following up with Dr. Einar Gip on a yearly basis. She has not had any recent infections.  She has had her annual flu shot.    Activities of Daily Living:  Patient reports morning stiffness for 20 minutes.   Patient Reports nocturnal pain.  Difficulty dressing/grooming: Denies Difficulty climbing stairs: Denies Difficulty getting out of chair: Reports Difficulty using hands for taps, buttons, cutlery, and/or writing: Reports  Review of Systems  Constitutional:  Positive for fatigue.  HENT:  Positive for mouth dryness. Negative for mouth sores and nose dryness.   Eyes:  Positive for dryness. Negative for pain and  visual disturbance.  Respiratory:  Negative for cough, hemoptysis and difficulty breathing.   Cardiovascular:  Positive for palpitations. Negative for chest pain, hypertension and swelling in legs/feet.  Gastrointestinal:  Negative for blood in stool, constipation and diarrhea.  Endocrine: Negative for increased urination.  Genitourinary:  Positive for involuntary urination. Negative for painful urination.  Musculoskeletal:  Positive for joint pain, joint pain and morning stiffness. Negative for gait problem, joint swelling, myalgias, muscle weakness, muscle tenderness and myalgias.  Skin:  Positive for sensitivity to sunlight. Negative for color change, pallor, rash, hair loss, nodules/bumps, skin tightness and ulcers.  Allergic/Immunologic: Negative for susceptible to infections.  Neurological:  Positive for dizziness. Negative for numbness, headaches and weakness.  Hematological:  Negative for swollen glands.  Psychiatric/Behavioral:  Negative for depressed mood and sleep disturbance. The patient is not nervous/anxious.     PMFS History:  Patient Active Problem List   Diagnosis Date Noted   Numbness 03/31/2020   Mononeuropathy multiplex syndrome 03/31/2020   Neuritis of right ulnar nerve 03/31/2020   Polyneuropathy 03/31/2020   Hoarseness with classic voice fatigue 10/10/2018   S/P lumbar laminectomy 04/06/2018   Radiculopathy due to lumbar intervertebral disc disorder 12/07/2017   Congenital spondylolysis 12/07/2017   Spondylolisthesis of lumbar region 12/07/2017   DDD (degenerative disc disease), lumbar 12/08/2016   High risk medication use 03/15/2016   DJD (degenerative joint disease), cervical 03/15/2016  History of asthma 03/15/2016   History of squamous cell carcinoma 03/15/2016   History of basal cell carcinoma 03/15/2016   History of breast cancer 03/15/2016   History of Chiari malformation 03/15/2016   Vitamin D deficiency 03/15/2016   Rheumatoid arthritis with  rheumatoid factor of multiple sites without organ or systems involvement ([place]) 10/20/2015   Esophageal reflux 05/01/2015   Anxiety 05/01/2015   Sjogrens syndrome ([place]) 05/01/2015   Chronic obstructive airway disease with asthma ([place]) 05/01/2015   Deviated nasal septum 05/24/2014    Class: Chronic   Insomnia 11/25/2007   DOE (dyspnea on exertion) 11/15/2007    Past Medical History:  Diagnosis Date   Anemia    Asthma    inhaler one x per day   Breast cancer ([place])    Cancer ([place])    breast, left   Chiari malformation    Difficult intubation    cannot hyperextend neck due to Chiari malformation with tonsillar involvement, RA   Dysrhythmia 2015   history of PAC's   GERD (gastroesophageal reflux disease)    High cholesterol    dx by PCP per patient    Personal history of radiation therapy    PONV (postoperative nausea and vomiting)    Rheumatoid arthritis ([place])    on Humira    Family History  Problem Relation Age of Onset   Diabetes Mother    Heart failure Mother    Bladder Cancer Mother    Stroke Mother    Cancer Father        head and neck   Uterine cancer Maternal Aunt    Bladder Cancer Maternal Grandmother    Colon cancer Maternal Grandmother    Breast cancer Cousin    Past Surgical History:  Procedure Laterality Date   BREAST SURGERY Left 2003   Lumpectomy T1C1, NO and negative ER/ PR   COLONOSCOPY  10/04   COLONOSCOPY  11/10   normal recheck in 5 years   COLPORRHAPHY     ESOPHAGOGASTRODUODENOSCOPY ENDOSCOPY  11/10   GERD   FOOT SURGERY Left age 59   removal of pseudo rheumatoid nodules from left forefoot.   LAPAROSCOPIC BILATERAL SALPINGO OOPHERECTOMY N/A 07/30/2013   Procedure: LAPAROSCOPIC BILATERAL SALPINGO OOPHORECTOMY WITH WASHINGS;  Surgeon: Lyman Speller, MD;  Location: [facility];  Service: Gynecology;  Laterality: N/A;   LUMBAR LAMINECTOMY/DECOMPRESSION MICRODISCECTOMY Right 04/06/2018   Procedure: Extraforaminal Microdiscectomy - Lumbar Two-Lumbar  Three - right;  Surgeon: Eustace Gusler, MD;  Location: [facility];  Service: Neurosurgery;  Laterality: Right;  Extraforaminal Microdiscectomy - Lumbar Two-Lumbar Three - right   NASAL SEPTOPLASTY W/ TURBINOPLASTY Bilateral 05/24/2014   Procedure: NASAL SEPTOPLASTY WITH  BILATERAL TURBINATE REDUCTION;  Surgeon: Jerrell Belfast, MD;  Location: [facility];  Service: ENT;  Laterality: Bilateral;   PELVIC FLOOR REPAIR  3/04   [place] with AP colporrhaphy   PILONIDAL CYST EXCISION     TOTAL VAGINAL HYSTERECTOMY  1989   with AP repair   Social History   Social History Narrative   Lives alone in a 2 story home. Has 2 children.  Works as a Radiation protection practitioner.  Education: college.    Immunization History  Administered Date(s) Administered   Fluad Quad(high Dose 65+) 10/14/2018   Influenza, High Dose Seasonal PF 10/26/2017   Influenza,inj,Quad PF,6+ Mos 10/18/2016   Influenza-Unspecified 10/08/2014, 10/24/2015   Janssen (J&J) SARS-COV-2 Vaccination 05/01/2019   PFIZER(Purple Top)SARS-COV-2 Vaccination 02/15/2020, 09/03/2020   Pneumococcal-Unspecified 10/18/2007   Tdap 11/24/2008, 05/04/2021   Zoster  Recombinat (Shingrix) 12/25/2016     Objective: Vital Signs: BP 110/65 (BP Location: Left Arm, Patient Position: Sitting, Cuff Size: Normal)   Pulse 84   Resp 16   Ht 5' 1.5" (1.562 m)   Wt 131 lb (59.4 kg)   LMP 02/15/1985 (Approximate)   BMI 24.35 kg/m    Physical Exam Vitals and nursing note reviewed.  Constitutional:      Appearance: She is well-developed.  HENT:     Head: Normocephalic and atraumatic.  Eyes:     Conjunctiva/sclera: Conjunctivae normal.  Cardiovascular:     Rate and Rhythm: Normal rate and regular rhythm.     Heart sounds: Normal heart sounds.  Pulmonary:     Effort: Pulmonary effort is normal.     Breath sounds: Normal breath sounds.  Abdominal:     General: Bowel sounds are normal.     Palpations: Abdomen is soft.  Musculoskeletal:     Cervical back: Normal range of motion.   Skin:    General: Skin is warm and dry.     Capillary Refill: Capillary refill takes less than 2 seconds.  Neurological:     Mental Status: She is alert and oriented to person, place, and time.  Psychiatric:        Behavior: Behavior normal.      Musculoskeletal Exam: C-spine has good range of motion with no discomfort.  Painful range of motion of the lumbar spine.  Shoulder joints, elbow joints, wrist joints, MCPs, PIPs, DIPs have good range of motion with no synovitis.  Prominence over the right CMC joint.  Ulnar deviation of right MCP joints noted.  PIP and DIP thickening consistent with osteoarthritis of both hands.  Hip joints have good range of motion with no groin pain.  Tenderness over the right trochanteric bursa.  Knee joints have good range of motion with no warmth or effusion.  Ankle joints have good range of motion with no tenderness or synovitis.  Bunions noted bilaterally.  PIP and DIP thickening consistent with OA of both feet.   CDAI Exam: CDAI Score: 0.4  Patient Global: 2 mm; Provider Global: 2 mm Swollen: 0 ; Tender: 0  Joint Exam 11/04/2021   No joint exam has been documented for this visit   There is currently no information documented on the homunculus. Go to the Rheumatology activity and complete the homunculus joint exam.  Investigation: No additional findings.  Imaging: No results found.  Recent Labs: Lab Results  Component Value Date   WBC 7.0 09/09/2021   HGB 12.4 09/09/2021   PLT 354 09/09/2021   NA 138 09/09/2021   K 4.4 09/09/2021   CL 101 09/09/2021   CO2 19 (L) 09/09/2021   GLUCOSE 105 (H) 09/09/2021   BUN 13 09/09/2021   CREATININE 0.52 (L) 09/09/2021   BILITOT 0.4 09/09/2021   ALKPHOS 70 09/09/2021   AST 19 09/09/2021   ALT 15 09/09/2021   PROT 6.4 09/09/2021   ALBUMIN 4.6 09/09/2021   CALCIUM 9.3 09/09/2021   GFRAA 114 03/31/2020   QFTBGOLD Negative 06/15/2016   QFTBGOLDPLUS NEGATIVE 06/03/2021    Speciality Comments: Orencia  IV '500mg'$  every 4 weeks, patient infuses at [facility] Tb gold negative 06/15/16  Procedures:  Large Joint Inj: R greater trochanter on 11/04/2021 10:31 AM Indications: pain Details: 27 G 1.5 in needle, lateral approach  Arthrogram: No  Medications: 1.5 mL lidocaine 1 %; 40 mg triamcinolone acetonide 40 MG/ML Aspirate: 0 mL Outcome: tolerated well, no immediate complications  Procedure, treatment alternatives, risks and benefits explained, specific risks discussed. Consent was given by the patient. Immediately prior to procedure a time out was called to verify the correct patient, procedure, equipment, support staff and site/side marked as required. Patient was prepped and draped in the usual sterile fashion.     Allergies: Fish allergy, Iodine, Boniva [ibandronic acid], Fosamax [alendronate], Paxlovid [nirmatrelvir-ritonavir], and Sulfonamide derivatives     Assessment / Plan:     Visit Diagnoses: Rheumatoid arthritis with rheumatoid factor of multiple sites without organ or systems involvement ([place]) - +RF, +CCP: She has no joint tenderness or synovitis on examination today.  She has not had any recent rheumatoid arthritis flares.  She has been experiencing some increased arthralgias and joint stiffness which she attributes to having to postpone her Orencia infusion in August and September due to undergoing dental implant surgery on 10/05/2021.  She has been cleared by her periodontist on Monday to resume her Orencia infusion in October.  Overall she continues to find Orencia to be effective at managing her rheumatoid arthritis.  No medication changes will be made at this time.  She was advised to notify us if she develops increased joint pain or joint swelling.  She will follow-up in the office in 5 months or sooner if needed.  High risk medication use - Currently holding-Orencia IV infusion 500 mg every 28 days at [facility].  (inadequate response to Enbrel and Humira). CBC and CMP updated on 09/09/2021.   Patient will be having updated lab work with her infusion in October. TB Gold negative on 06/03/2021 and will continue to be monitored yearly. She has not had any recent or recurrent infections.  Discussed the importance of holding Orencia if she develops signs or symptoms of an infection and to resume once the infection has completely cleared. She has been holding [place] in October and September due to undergoing dental implant surgery on 10/05/2021.  She received clearance by her peer dentist on Monday to resume Orencia and October 2023.  Trochanteric bursitis of right hip - She presents today with increased discomfort on the lateral aspect of her right hip.  No recent injury or fall.  On examination she has good range of motion of the right hip joint with no groin pain.  She has tenderness palpation over the right trochanteric bursa.  Different treatment options were discussed today.  She requested a right trochanteric bursa cortisone injection.  She tolerated the procedure well.  Procedure note was completed above.  Aftercare was discussed.  She was advised to notify us if her symptoms persist or worsen.  Plan: Large Joint Inj: R greater trochanter  DDD (degenerative disc disease), cervical - MRI C-spine 04/03/2020 results were reviewed with him today in the office.  She has mild multilevel degenerative changes.   DDD (degenerative disc disease), lumbar - S/p lumbar laminectomy on 04/06/18 by Dr. Ronnald Ramp.  Doing well overall.  No midline spinal tenderness.  Age-related osteoporosis without current pathological fracture - DEXA 02/12/2019: Right femoral neck BMD 0.552 with T score -2.7. DEXA updated on 08/25/21: The BMD measured at Femur Neck Right is 0.708 g/cm2 with a T-score of -2.4. Patient has declined treatment for osteoporosis. She is taking vitamin D 5000 units.  Discussed the importance of resistive exercises.  She has been walking at least 10,000 steps daily for exercise.  Vitamin D deficiency:  She is taking vitamin D 5000 units daily.   Primary insomnia: She is taking restoril at bedtime.   Other  medical conditions are listed as follows:   Chest discomfort: Following up with Dr. Einar Gip yearly.   History of Chiari malformation  History of asthma  History of squamous cell carcinoma  Gastroesophageal reflux disease without esophagitis  History of basal cell carcinoma  History of breast cancer  Orders: Orders Placed This Encounter  Procedures   Large Joint Inj: R greater trochanter   No orders of the defined types were placed in this encounter.    Follow-Up Instructions: Return in about 5 months (around 04/06/2022) for Rheumatoid arthritis, DDD.   Ofilia Neas, PA-C  Note - This record has been created using Dragon software.  Chart creation errors have been sought, but may not always  have been located. Such creation errors do not reflect on  the standard of medical care.

## 2021-11-04 ENCOUNTER — Encounter: Payer: Self-pay | Admitting: Physician Assistant

## 2021-11-04 ENCOUNTER — Ambulatory Visit: Payer: Medicare Other | Attending: Physician Assistant | Admitting: Physician Assistant

## 2021-11-04 VITALS — BP 110/65 | HR 84 | Resp 16 | Ht 61.5 in | Wt 131.0 lb

## 2021-11-04 DIAGNOSIS — M7061 Trochanteric bursitis, right hip: Secondary | ICD-10-CM

## 2021-11-04 DIAGNOSIS — M51369 Other intervertebral disc degeneration, lumbar region without mention of lumbar back pain or lower extremity pain: Secondary | ICD-10-CM

## 2021-11-04 DIAGNOSIS — M5136 Other intervertebral disc degeneration, lumbar region: Secondary | ICD-10-CM | POA: Diagnosis not present

## 2021-11-04 DIAGNOSIS — M810 Age-related osteoporosis without current pathological fracture: Secondary | ICD-10-CM

## 2021-11-04 DIAGNOSIS — Z85828 Personal history of other malignant neoplasm of skin: Secondary | ICD-10-CM | POA: Diagnosis not present

## 2021-11-04 DIAGNOSIS — F5101 Primary insomnia: Secondary | ICD-10-CM

## 2021-11-04 DIAGNOSIS — Z8669 Personal history of other diseases of the nervous system and sense organs: Secondary | ICD-10-CM

## 2021-11-04 DIAGNOSIS — E559 Vitamin D deficiency, unspecified: Secondary | ICD-10-CM | POA: Diagnosis not present

## 2021-11-04 DIAGNOSIS — Z8589 Personal history of malignant neoplasm of other organs and systems: Secondary | ICD-10-CM

## 2021-11-04 DIAGNOSIS — M5030 Other cervical disc degeneration, unspecified cervical region: Secondary | ICD-10-CM

## 2021-11-04 DIAGNOSIS — K219 Gastro-esophageal reflux disease without esophagitis: Secondary | ICD-10-CM

## 2021-11-04 DIAGNOSIS — M0579 Rheumatoid arthritis with rheumatoid factor of multiple sites without organ or systems involvement: Secondary | ICD-10-CM | POA: Diagnosis not present

## 2021-11-04 DIAGNOSIS — Z853 Personal history of malignant neoplasm of breast: Secondary | ICD-10-CM | POA: Diagnosis not present

## 2021-11-04 DIAGNOSIS — R0789 Other chest pain: Secondary | ICD-10-CM | POA: Diagnosis not present

## 2021-11-04 DIAGNOSIS — Z79899 Other long term (current) drug therapy: Secondary | ICD-10-CM

## 2021-11-04 DIAGNOSIS — Z8709 Personal history of other diseases of the respiratory system: Secondary | ICD-10-CM

## 2021-11-04 MED ORDER — LIDOCAINE HCL 1 % IJ SOLN
1.5000 mL | INTRAMUSCULAR | Status: AC | PRN
  Administered 2021-11-04: 1.5 mL

## 2021-11-04 MED ORDER — TRIAMCINOLONE ACETONIDE 40 MG/ML IJ SUSP
40.0000 mg | INTRAMUSCULAR | Status: AC | PRN
  Administered 2021-11-04: 40 mg via INTRA_ARTICULAR

## 2021-11-18 ENCOUNTER — Other Ambulatory Visit: Payer: Self-pay | Admitting: Neurology

## 2021-11-18 ENCOUNTER — Other Ambulatory Visit: Payer: Self-pay | Admitting: *Deleted

## 2021-11-18 DIAGNOSIS — M0579 Rheumatoid arthritis with rheumatoid factor of multiple sites without organ or systems involvement: Secondary | ICD-10-CM

## 2021-11-18 DIAGNOSIS — Z79899 Other long term (current) drug therapy: Secondary | ICD-10-CM

## 2021-11-18 DIAGNOSIS — M0519 Rheumatoid lung disease with rheumatoid arthritis of multiple sites: Secondary | ICD-10-CM | POA: Diagnosis not present

## 2021-11-18 NOTE — Addendum Note (Signed)
Addended by: Wyvonnia Lora on: 11/18/2021 02:20 PM   Modules accepted: Orders

## 2021-11-19 LAB — CBC WITH DIFFERENTIAL/PLATELET
Basophils Absolute: 0 10*3/uL (ref 0.0–0.2)
Basos: 0 %
EOS (ABSOLUTE): 0.3 10*3/uL (ref 0.0–0.4)
Eos: 3 %
Hematocrit: 38.5 % (ref 34.0–46.6)
Hemoglobin: 12.9 g/dL (ref 11.1–15.9)
Immature Grans (Abs): 0 10*3/uL (ref 0.0–0.1)
Immature Granulocytes: 0 %
Lymphocytes Absolute: 2.1 10*3/uL (ref 0.7–3.1)
Lymphs: 21 %
MCH: 28.8 pg (ref 26.6–33.0)
MCHC: 33.5 g/dL (ref 31.5–35.7)
MCV: 86 fL (ref 79–97)
Monocytes Absolute: 0.7 10*3/uL (ref 0.1–0.9)
Monocytes: 7 %
Neutrophils Absolute: 6.9 10*3/uL (ref 1.4–7.0)
Neutrophils: 69 %
Platelets: 388 10*3/uL (ref 150–450)
RBC: 4.48 x10E6/uL (ref 3.77–5.28)
RDW: 13.1 % (ref 11.7–15.4)
WBC: 10 10*3/uL (ref 3.4–10.8)

## 2021-11-19 LAB — COMPREHENSIVE METABOLIC PANEL
ALT: 14 IU/L (ref 0–32)
AST: 17 IU/L (ref 0–40)
Albumin/Globulin Ratio: 2.7 — ABNORMAL HIGH (ref 1.2–2.2)
Albumin: 4.6 g/dL (ref 3.9–4.9)
Alkaline Phosphatase: 71 IU/L (ref 44–121)
BUN/Creatinine Ratio: 32 — ABNORMAL HIGH (ref 12–28)
BUN: 18 mg/dL (ref 8–27)
Bilirubin Total: 0.4 mg/dL (ref 0.0–1.2)
CO2: 22 mmol/L (ref 20–29)
Calcium: 8.8 mg/dL (ref 8.7–10.3)
Chloride: 97 mmol/L (ref 96–106)
Creatinine, Ser: 0.57 mg/dL (ref 0.57–1.00)
Globulin, Total: 1.7 g/dL (ref 1.5–4.5)
Glucose: 100 mg/dL — ABNORMAL HIGH (ref 70–99)
Potassium: 3.7 mmol/L (ref 3.5–5.2)
Sodium: 135 mmol/L (ref 134–144)
Total Protein: 6.3 g/dL (ref 6.0–8.5)
eGFR: 98 mL/min/{1.73_m2} (ref >59)

## 2021-11-24 ENCOUNTER — Telehealth: Payer: Self-pay

## 2021-11-24 NOTE — Telephone Encounter (Signed)
-----   Message from Darleen Crocker, RN sent at 11/24/2021 10:28 AM EDT ----- Please advise the patient that our work in [place] reviewed the results from the recent lab work completed and states labs were in normal range and there was nothing that appeared concerning.   ----- Message ----- From: Kary Kos, CMA Sent: 11/24/2021   8:16 AM EDT To: [facility] Results   ----- Message ----- From: Penni Bombard, MD Sent: 11/23/2021   5:24 PM EDT To: [facility] Results  Unremarkable labs. Continue current plan. Please call patient. -VRP

## 2021-11-24 NOTE — Telephone Encounter (Signed)
Called pt to informed her labs were in normal range and there was nothing that appeared concerning. Pt would like for her results mailed to her.

## 2021-11-25 DIAGNOSIS — L308 Other specified dermatitis: Secondary | ICD-10-CM | POA: Diagnosis not present

## 2021-11-25 DIAGNOSIS — L570 Actinic keratosis: Secondary | ICD-10-CM | POA: Diagnosis not present

## 2021-11-25 DIAGNOSIS — D225 Melanocytic nevi of trunk: Secondary | ICD-10-CM | POA: Diagnosis not present

## 2021-11-25 DIAGNOSIS — Z85828 Personal history of other malignant neoplasm of skin: Secondary | ICD-10-CM | POA: Diagnosis not present

## 2021-11-25 DIAGNOSIS — L738 Other specified follicular disorders: Secondary | ICD-10-CM | POA: Diagnosis not present

## 2021-11-25 DIAGNOSIS — L578 Other skin changes due to chronic exposure to nonionizing radiation: Secondary | ICD-10-CM | POA: Diagnosis not present

## 2021-11-25 DIAGNOSIS — L814 Other melanin hyperpigmentation: Secondary | ICD-10-CM | POA: Diagnosis not present

## 2021-11-25 DIAGNOSIS — D485 Neoplasm of uncertain behavior of skin: Secondary | ICD-10-CM | POA: Diagnosis not present

## 2021-11-25 DIAGNOSIS — C44622 Squamous cell carcinoma of skin of right upper limb, including shoulder: Secondary | ICD-10-CM | POA: Diagnosis not present

## 2021-11-25 DIAGNOSIS — L821 Other seborrheic keratosis: Secondary | ICD-10-CM | POA: Diagnosis not present

## 2021-12-08 DIAGNOSIS — C44622 Squamous cell carcinoma of skin of right upper limb, including shoulder: Secondary | ICD-10-CM | POA: Diagnosis not present

## 2021-12-09 DIAGNOSIS — Z23 Encounter for immunization: Secondary | ICD-10-CM | POA: Diagnosis not present

## 2021-12-30 DIAGNOSIS — M0519 Rheumatoid lung disease with rheumatoid arthritis of multiple sites: Secondary | ICD-10-CM | POA: Diagnosis not present

## 2022-01-15 ENCOUNTER — Other Ambulatory Visit: Payer: Self-pay | Admitting: Cardiology

## 2022-01-15 DIAGNOSIS — E559 Vitamin D deficiency, unspecified: Secondary | ICD-10-CM

## 2022-02-02 DIAGNOSIS — M5030 Other cervical disc degeneration, unspecified cervical region: Secondary | ICD-10-CM | POA: Diagnosis not present

## 2022-02-02 DIAGNOSIS — E7800 Pure hypercholesterolemia, unspecified: Secondary | ICD-10-CM | POA: Diagnosis not present

## 2022-02-02 DIAGNOSIS — E559 Vitamin D deficiency, unspecified: Secondary | ICD-10-CM | POA: Diagnosis not present

## 2022-02-02 DIAGNOSIS — M5136 Other intervertebral disc degeneration, lumbar region: Secondary | ICD-10-CM | POA: Diagnosis not present

## 2022-02-02 DIAGNOSIS — F988 Other specified behavioral and emotional disorders with onset usually occurring in childhood and adolescence: Secondary | ICD-10-CM | POA: Diagnosis not present

## 2022-02-02 DIAGNOSIS — R7303 Prediabetes: Secondary | ICD-10-CM | POA: Diagnosis not present

## 2022-02-02 DIAGNOSIS — I700 Atherosclerosis of aorta: Secondary | ICD-10-CM | POA: Diagnosis not present

## 2022-02-02 DIAGNOSIS — J45909 Unspecified asthma, uncomplicated: Secondary | ICD-10-CM | POA: Diagnosis not present

## 2022-02-02 DIAGNOSIS — Z79899 Other long term (current) drug therapy: Secondary | ICD-10-CM | POA: Diagnosis not present

## 2022-02-02 DIAGNOSIS — M069 Rheumatoid arthritis, unspecified: Secondary | ICD-10-CM | POA: Diagnosis not present

## 2022-02-02 DIAGNOSIS — Z0000 Encounter for general adult medical examination without abnormal findings: Secondary | ICD-10-CM | POA: Diagnosis not present

## 2022-02-02 DIAGNOSIS — K219 Gastro-esophageal reflux disease without esophagitis: Secondary | ICD-10-CM | POA: Diagnosis not present

## 2022-02-11 ENCOUNTER — Other Ambulatory Visit (INDEPENDENT_AMBULATORY_CARE_PROVIDER_SITE_OTHER): Payer: Self-pay

## 2022-02-11 DIAGNOSIS — M0579 Rheumatoid arthritis with rheumatoid factor of multiple sites without organ or systems involvement: Secondary | ICD-10-CM | POA: Diagnosis not present

## 2022-02-11 DIAGNOSIS — M0519 Rheumatoid lung disease with rheumatoid arthritis of multiple sites: Secondary | ICD-10-CM | POA: Diagnosis not present

## 2022-02-11 DIAGNOSIS — Z0289 Encounter for other administrative examinations: Secondary | ICD-10-CM

## 2022-02-11 DIAGNOSIS — Z79899 Other long term (current) drug therapy: Secondary | ICD-10-CM | POA: Diagnosis not present

## 2022-02-12 LAB — COMPREHENSIVE METABOLIC PANEL
ALT: 18 IU/L (ref 0–32)
AST: 16 IU/L (ref 0–40)
Albumin/Globulin Ratio: 2.6 — ABNORMAL HIGH (ref 1.2–2.2)
Albumin: 4.7 g/dL (ref 3.9–4.9)
Alkaline Phosphatase: 71 IU/L (ref 44–121)
BUN/Creatinine Ratio: 22 (ref 12–28)
BUN: 14 mg/dL (ref 8–27)
Bilirubin Total: 0.3 mg/dL (ref 0.0–1.2)
CO2: 21 mmol/L (ref 20–29)
Calcium: 9.4 mg/dL (ref 8.7–10.3)
Chloride: 101 mmol/L (ref 96–106)
Creatinine, Ser: 0.65 mg/dL (ref 0.57–1.00)
Globulin, Total: 1.8 g/dL (ref 1.5–4.5)
Glucose: 130 mg/dL — ABNORMAL HIGH (ref 70–99)
Potassium: 3.8 mmol/L (ref 3.5–5.2)
Sodium: 139 mmol/L (ref 134–144)
Total Protein: 6.5 g/dL (ref 6.0–8.5)
eGFR: 95 mL/min/{1.73_m2} (ref >59)

## 2022-02-12 LAB — CBC WITH DIFFERENTIAL/PLATELET
Basophils Absolute: 0 10*3/uL (ref 0.0–0.2)
Basos: 1 %
EOS (ABSOLUTE): 0.4 10*3/uL (ref 0.0–0.4)
Eos: 4 %
Hematocrit: 38 % (ref 34.0–46.6)
Hemoglobin: 12.9 g/dL (ref 11.1–15.9)
Immature Grans (Abs): 0 10*3/uL (ref 0.0–0.1)
Immature Granulocytes: 0 %
Lymphocytes Absolute: 2.1 10*3/uL (ref 0.7–3.1)
Lymphs: 25 %
MCH: 29.1 pg (ref 26.6–33.0)
MCHC: 33.9 g/dL (ref 31.5–35.7)
MCV: 86 fL (ref 79–97)
Monocytes Absolute: 0.5 10*3/uL (ref 0.1–0.9)
Monocytes: 6 %
Neutrophils Absolute: 5.2 10*3/uL (ref 1.4–7.0)
Neutrophils: 64 %
Platelets: 413 10*3/uL (ref 150–450)
RBC: 4.44 x10E6/uL (ref 3.77–5.28)
RDW: 12.9 % (ref 11.7–15.4)
WBC: 8.2 10*3/uL (ref 3.4–10.8)

## 2022-02-16 ENCOUNTER — Telehealth: Payer: Self-pay

## 2022-02-16 NOTE — Telephone Encounter (Signed)
I spoke with the patient and informed her that her labs were fine. She verbalized understanding and expressed appreciation for the call. She is requesting a copy of her lab work to be mailed to her.

## 2022-02-16 NOTE — Telephone Encounter (Signed)
-----   Message from Britt Bottom, MD sent at 02/12/2022 10:07 AM EST ----- Please let the patient know that the lab work is fine.

## 2022-03-11 DIAGNOSIS — M0519 Rheumatoid lung disease with rheumatoid arthritis of multiple sites: Secondary | ICD-10-CM | POA: Diagnosis not present

## 2022-04-08 ENCOUNTER — Other Ambulatory Visit: Payer: Self-pay | Admitting: *Deleted

## 2022-04-08 DIAGNOSIS — M0579 Rheumatoid arthritis with rheumatoid factor of multiple sites without organ or systems involvement: Secondary | ICD-10-CM

## 2022-04-08 DIAGNOSIS — Z79899 Other long term (current) drug therapy: Secondary | ICD-10-CM

## 2022-04-08 DIAGNOSIS — M0519 Rheumatoid lung disease with rheumatoid arthritis of multiple sites: Secondary | ICD-10-CM | POA: Diagnosis not present

## 2022-04-08 DIAGNOSIS — J342 Deviated nasal septum: Secondary | ICD-10-CM

## 2022-04-08 NOTE — Progress Notes (Signed)
Office Visit Note  [name]             Date of Birth: 23-Nov-1950           MRN: [id number]             PCP: Mckinley Jewel, MD Referring: Kelton Pillar, MD Visit Date: 04/22/2022 Occupation: '@GUAROCC'$ @  Subjective:  Medication management  History of Present Illness: Kristy Perez is a 72 y.o. female with history of seropositive rheumatoid arthritis, degenerative disc disease and osteoporosis.  She had right forearm surgery for cell cancer and had to delay Orencia infusion with in October.  She developed COVID-19 virus infection in December and had to delay Orencia infusion again.  Since then she has been taking Orencia 500 mg IV every 28 days without any interruption.  She receives her Orencia infusions at [facility].  Denies any joint swelling.  She states she walks on a regular basis.  Sometimes she uses treadmill which causes increased discomfort in her feet.  She has been having some discomfort in her feet today as she walked on the treadmill yesterday.  She  continues to have some joint stiffness in her hands and especially the CMC joints.  She denies denies any discomfort in the trochanteric region today.  She continues to have neck and lower back pain due to underlying disc disease.    Activities of Daily Living:  Patient reports morning stiffness for 0 minutes.   Patient Reports nocturnal pain.  Difficulty dressing/grooming: Denies Difficulty climbing stairs: Reports Difficulty getting out of chair: Reports Difficulty using hands for taps, buttons, cutlery, and/or writing: Reports  Review of Systems  Constitutional:  Positive for fatigue.  HENT: Negative.  Negative for mouth sores and mouth dryness.   Eyes:  Positive for dryness.  Respiratory:  Positive for shortness of breath.        Patient was recently evaluated by Dr. Einar Gip per patient.  Cardiovascular: Negative.  Negative for chest pain and palpitations.  Gastrointestinal:  Negative for blood in stool,  constipation and diarrhea.  Endocrine: Negative.  Negative for increased urination.  Genitourinary: Negative.  Negative for involuntary urination.  Musculoskeletal:  Positive for joint pain and joint pain. Negative for gait problem, joint swelling, myalgias, muscle weakness, morning stiffness, muscle tenderness and myalgias.  Skin: Negative.  Negative for color change, rash, hair loss and sensitivity to sunlight.  Allergic/Immunologic: Negative.  Negative for susceptible to infections.  Neurological: Negative.  Negative for dizziness and headaches.  Hematological: Negative.  Negative for swollen glands.  Psychiatric/Behavioral: Negative.  Negative for depressed mood and sleep disturbance. The patient is not nervous/anxious.     PMFS History:  Patient Active Problem List   Diagnosis Date Noted   Numbness 03/31/2020   Mononeuropathy multiplex syndrome 03/31/2020   Neuritis of right ulnar nerve 03/31/2020   Polyneuropathy 03/31/2020   Hoarseness with classic voice fatigue 10/10/2018   S/P lumbar laminectomy 04/06/2018   Radiculopathy due to lumbar intervertebral disc disorder 12/07/2017   Congenital spondylolysis 12/07/2017   Spondylolisthesis of lumbar region 12/07/2017   DDD (degenerative disc disease), lumbar 12/08/2016   High risk medication use 03/15/2016   DJD (degenerative joint disease), cervical 03/15/2016   History of asthma 03/15/2016   History of squamous cell carcinoma 03/15/2016   History of basal cell carcinoma 03/15/2016   History of breast cancer 03/15/2016   History of Chiari malformation 03/15/2016   Vitamin D deficiency 03/15/2016   Rheumatoid arthritis with rheumatoid factor  of multiple sites without organ or systems involvement ([place]) 10/20/2015   Esophageal reflux 05/01/2015   Anxiety 05/01/2015   Sjogrens syndrome ([place]) 05/01/2015   Chronic obstructive airway disease with asthma 05/01/2015   Deviated nasal septum 05/24/2014    Class: Chronic   Insomnia  11/25/2007   DOE (dyspnea on exertion) 11/15/2007    Past Medical History:  Diagnosis Date   Anemia    Asthma    inhaler one x per day   Breast cancer ([place])    Cancer ([place])    breast, left   Chiari malformation    Difficult intubation    cannot hyperextend neck due to Chiari malformation with tonsillar involvement, RA   Dysrhythmia 2015   history of PAC's   GERD (gastroesophageal reflux disease)    High cholesterol    dx by PCP per patient    Personal history of radiation therapy    PONV (postoperative nausea and vomiting)    Rheumatoid arthritis ([place])    on Humira    Family History  Problem Relation Age of Onset   Diabetes Mother    Heart failure Mother    Bladder Cancer Mother    Stroke Mother    Cancer Father        head and neck   Uterine cancer Maternal Aunt    Bladder Cancer Maternal Grandmother    Colon cancer Maternal Grandmother    Breast cancer Cousin    Past Surgical History:  Procedure Laterality Date   BREAST SURGERY Left 2003   Lumpectomy T1C1, NO and negative ER/ PR   COLONOSCOPY  10/04   COLONOSCOPY  11/10   normal recheck in 5 years   COLPORRHAPHY     ESOPHAGOGASTRODUODENOSCOPY ENDOSCOPY  11/10   GERD   FOOT SURGERY Left age 51   removal of pseudo rheumatoid nodules from left forefoot.   LAPAROSCOPIC BILATERAL SALPINGO OOPHERECTOMY N/A 07/30/2013   Procedure: LAPAROSCOPIC BILATERAL SALPINGO OOPHORECTOMY WITH WASHINGS;  Surgeon: Lyman Speller, MD;  Location: [facility];  Service: Gynecology;  Laterality: N/A;   LUMBAR LAMINECTOMY/DECOMPRESSION MICRODISCECTOMY Right 04/06/2018   Procedure: Extraforaminal Microdiscectomy - Lumbar Two-Lumbar Three - right;  Surgeon: Eustace Kienast, MD;  Location: [facility];  Service: Neurosurgery;  Laterality: Right;  Extraforaminal Microdiscectomy - Lumbar Two-Lumbar Three - right   NASAL SEPTOPLASTY W/ TURBINOPLASTY Bilateral 05/24/2014   Procedure: NASAL SEPTOPLASTY WITH  BILATERAL TURBINATE REDUCTION;  Surgeon: Jerrell Belfast, MD;  Location: [facility];  Service: ENT;  Laterality: Bilateral;   PELVIC FLOOR REPAIR  3/04   [place] with AP colporrhaphy   PILONIDAL CYST EXCISION     TOTAL VAGINAL HYSTERECTOMY  1989   with AP repair   Social History   Social History Narrative   Lives alone in a 2 story home. Has 2 children.  Works as a Radiation protection practitioner.  Education: college.    Immunization History  Administered Date(s) Administered   Fluad Quad(high Dose 65+) 10/14/2018   Influenza, High Dose Seasonal PF 10/26/2017   Influenza,inj,Quad PF,6+ Mos 10/18/2016   Influenza-Unspecified 10/08/2014, 10/24/2015   Janssen (J&J) SARS-COV-2 Vaccination 05/01/2019   PFIZER(Purple Top)SARS-COV-2 Vaccination 02/15/2020, 09/03/2020   Pneumococcal-Unspecified 10/18/2007   Tdap 11/24/2008, 05/04/2021   Zoster Recombinat (Shingrix) 12/25/2016     Objective: Vital Signs: BP 95/60 (BP Location: Left Arm, Patient Position: Sitting, Cuff Size: Normal)   Pulse 80   Resp 14   Ht '5\' 1"'$  (1.549 m)   Wt 128 lb (58.1 kg)   LMP 02/15/1985 (  Approximate)   BMI 24.19 kg/m    Physical Exam Vitals and nursing note reviewed.  Constitutional:      Appearance: She is well-developed.  HENT:     Head: Normocephalic and atraumatic.  Eyes:     Conjunctiva/sclera: Conjunctivae normal.  Cardiovascular:     Rate and Rhythm: Normal rate and regular rhythm.     Heart sounds: Normal heart sounds.  Pulmonary:     Effort: Pulmonary effort is normal.     Breath sounds: Normal breath sounds.  Abdominal:     General: Bowel sounds are normal.     Palpations: Abdomen is soft.  Musculoskeletal:     Cervical back: Normal range of motion.  Lymphadenopathy:     Cervical: No cervical adenopathy.  Skin:    General: Skin is warm and dry.     Capillary Refill: Capillary refill takes less than 2 seconds.  Neurological:     Mental Status: She is alert and oriented to person, place, and time.  Psychiatric:        Behavior: Behavior normal.       Musculoskeletal Exam: She had limited lateral rotation of the cervical spine.  She had painful limited range of motion of the lumbar spine.  Shoulder joints and elbow joints were in good range of motion.  Wrist joints and MCPs were in good range of motion.  She had bilateral second MCP thickening.  Ulnar deviation was noted.  PIP and DIP thickening and CMC prominence was noted.  No synovitis was noted.  Hip joints and knee joints in good range of motion.  No warmth swelling or effusion was noted.  She had no tenderness over ankles or MTPs.  She continues to have some discomfort under her metatarsals due to loss of the fat pad.  CDAI Exam: CDAI Score: -- Patient Global: --; Provider Global: -- Swollen: --; Tender: -- Joint Exam 04/22/2022   No joint exam has been documented for this visit   There is currently no information documented on the homunculus. Go to the Rheumatology activity and complete the homunculus joint exam.  Investigation: No additional findings.  Imaging: No results found.  Recent Labs: Lab Results  Component Value Date   WBC 7.4 04/08/2022   HGB 12.9 04/08/2022   PLT 379 04/08/2022   NA 140 04/08/2022   K 4.1 04/08/2022   CL 102 04/08/2022   CO2 22 04/08/2022   GLUCOSE 93 04/08/2022   BUN 16 04/08/2022   CREATININE 0.54 (L) 04/08/2022   BILITOT 0.3 04/08/2022   ALKPHOS 70 04/08/2022   AST 19 04/08/2022   ALT 19 04/08/2022   PROT 6.6 04/08/2022   ALBUMIN 4.6 04/08/2022   CALCIUM 9.4 04/08/2022   GFRAA 114 03/31/2020   QFTBGOLD Negative 06/15/2016   QFTBGOLDPLUS NEGATIVE 06/03/2021    Speciality Comments: Orencia IV '500mg'$  every 4 weeks, patient infuses at [facility] Tb gold negative 06/15/16  Procedures:  No procedures performed Allergies: Fish allergy, Iodine, Boniva [ibandronic acid], Fosamax [alendronate], Paxlovid [nirmatrelvir-ritonavir], and Sulfonamide derivatives   Assessment / Plan:     Visit Diagnoses: Rheumatoid arthritis with rheumatoid factor  of multiple sites without organ or systems involvement ([place]) - - +RF, +CCP: Patient states that her rheumatoid arthritis is well-controlled without having any flares.  Although she continues to have some pain and stiffness in her joints especially in her hands and her feet.  She states yesterday she walked on a treadmill and after that her feet started hurting.  She has  not noticed any joint swelling.  She has been tolerating Orencia infusions without any side effects.  She had interruption in Orencia infusions in October and later in December due to surgery on her right arm for squamous cell carcinoma and for COVID-19 virus infection respectively.  High risk medication use - Orencia IV infusion 500 mg every 28 days at [facility].  (inadequate response to Enbrel and Humira). -Labs from April 08, 2022 CBC and CMP were normal.  TB Gold was negative on June 03, 2021.  She has been getting labs at [facility].  Will get TB Gold here.  Information about immunization was placed in the AVS.  She was advised to hold Orencia if she develops an infection resume after the infection resolves.  Plan: QuantiFERON-TB Gold Plus  Trochanteric bursitis of right hip-improved.  I given stretches were advised.  DDD (degenerative disc disease), cervical -she continues to have some stiffness in her cervical spine.  MRI C-spine 04/03/2020  DDD (degenerative disc disease), lumbar -she has chronic lower back pain which interferes with her walking at times.  S/p lumbar laminectomy on 04/06/18 by Dr. Ronnald Ramp  Age-related osteoporosis without current pathological fracture - DEXA 02/12/2019: Right femoral neck BMD 0.552 with T score -2.7DEXA updated on 08/25/21: The BMD RFN is 0.708 g/cm2 with a T-score of -2.4.  She declined treatment for osteoporosis in the past.  Her BMD improved.  She will continue with vitamin D and calcium.  She is currently on vitamin D 5000 units daily.  Will check vitamin D level today.  Vitamin D deficiency -she takes  vitamin D 5000 units daily.  Plan: VITAMIN D 25 Hydroxy (Vit-D Deficiency, Fractures)  Primary insomnia-manageable with Restoril.  Other medical problems listed as follows:  History of Chiari malformation  History of asthma  History of squamous cell carcinoma  Gastroesophageal reflux disease without esophagitis  History of basal cell carcinoma  History of breast cancer  Orders: Orders Placed This Encounter  Procedures   QuantiFERON-TB Gold Plus   VITAMIN D 25 Hydroxy (Vit-D Deficiency, Fractures)   No orders of the defined types were placed in this encounter.    Follow-Up Instructions: Return in about 5 months (around 09/22/2022) for Rheumatoid arthritis, Osteoarthritis, Osteoporosis.   Bo Merino, MD  Note - This record has been created using Editor, commissioning.  Chart creation errors have been sought, but may not always  have been located. Such creation errors do not reflect on  the standard of medical care.

## 2022-04-09 LAB — CBC WITH DIFFERENTIAL/PLATELET
Basophils Absolute: 0 10*3/uL (ref 0.0–0.2)
Basos: 1 %
EOS (ABSOLUTE): 0.4 10*3/uL (ref 0.0–0.4)
Eos: 5 %
Hematocrit: 39.7 % (ref 34.0–46.6)
Hemoglobin: 12.9 g/dL (ref 11.1–15.9)
Immature Grans (Abs): 0 10*3/uL (ref 0.0–0.1)
Immature Granulocytes: 0 %
Lymphocytes Absolute: 2.3 10*3/uL (ref 0.7–3.1)
Lymphs: 31 %
MCH: 28.4 pg (ref 26.6–33.0)
MCHC: 32.5 g/dL (ref 31.5–35.7)
MCV: 87 fL (ref 79–97)
Monocytes Absolute: 0.5 10*3/uL (ref 0.1–0.9)
Monocytes: 7 %
Neutrophils Absolute: 4.1 10*3/uL (ref 1.4–7.0)
Neutrophils: 56 %
Platelets: 379 10*3/uL (ref 150–450)
RBC: 4.55 x10E6/uL (ref 3.77–5.28)
RDW: 12.8 % (ref 11.7–15.4)
WBC: 7.4 10*3/uL (ref 3.4–10.8)

## 2022-04-09 LAB — COMPREHENSIVE METABOLIC PANEL
ALT: 19 IU/L (ref 0–32)
AST: 19 IU/L (ref 0–40)
Albumin/Globulin Ratio: 2.3 — ABNORMAL HIGH (ref 1.2–2.2)
Albumin: 4.6 g/dL (ref 3.9–4.9)
Alkaline Phosphatase: 70 IU/L (ref 44–121)
BUN/Creatinine Ratio: 30 — ABNORMAL HIGH (ref 12–28)
BUN: 16 mg/dL (ref 8–27)
Bilirubin Total: 0.3 mg/dL (ref 0.0–1.2)
CO2: 22 mmol/L (ref 20–29)
Calcium: 9.4 mg/dL (ref 8.7–10.3)
Chloride: 102 mmol/L (ref 96–106)
Creatinine, Ser: 0.54 mg/dL — ABNORMAL LOW (ref 0.57–1.00)
Globulin, Total: 2 g/dL (ref 1.5–4.5)
Glucose: 93 mg/dL (ref 70–99)
Potassium: 4.1 mmol/L (ref 3.5–5.2)
Sodium: 140 mmol/L (ref 134–144)
Total Protein: 6.6 g/dL (ref 6.0–8.5)
eGFR: 99 mL/min/{1.73_m2} (ref >59)

## 2022-04-12 ENCOUNTER — Telehealth: Payer: Self-pay | Admitting: *Deleted

## 2022-04-12 NOTE — Telephone Encounter (Signed)
-----   Message from Britt Bottom, MD sent at 04/09/2022  8:30 AM EST ----- Please let the patient know that the lab work is fine.

## 2022-04-12 NOTE — Telephone Encounter (Signed)
Called and spoke with pt about results per Dr. Garth Bigness note. Pt verbalized understanding. She would like copy mailed to her. Aware I will send her request to medical records.

## 2022-04-13 ENCOUNTER — Encounter: Payer: Self-pay | Admitting: Neurology

## 2022-04-13 ENCOUNTER — Ambulatory Visit (INDEPENDENT_AMBULATORY_CARE_PROVIDER_SITE_OTHER): Payer: Medicare Other | Admitting: Neurology

## 2022-04-13 VITALS — BP 121/81 | HR 74 | Ht 61.0 in | Wt 128.5 lb

## 2022-04-13 DIAGNOSIS — M47812 Spondylosis without myelopathy or radiculopathy, cervical region: Secondary | ICD-10-CM | POA: Diagnosis not present

## 2022-04-13 DIAGNOSIS — Z79899 Other long term (current) drug therapy: Secondary | ICD-10-CM

## 2022-04-13 DIAGNOSIS — Z8669 Personal history of other diseases of the nervous system and sense organs: Secondary | ICD-10-CM

## 2022-04-13 DIAGNOSIS — G603 Idiopathic progressive neuropathy: Secondary | ICD-10-CM

## 2022-04-13 DIAGNOSIS — M0579 Rheumatoid arthritis with rheumatoid factor of multiple sites without organ or systems involvement: Secondary | ICD-10-CM

## 2022-04-13 NOTE — Progress Notes (Signed)
[facility]  [name] DOB: 02/02/1951  REFERRING DOCTOR OR PCP:  Dr. Estanislado Pandy SOURCE: patient, notes from Dr. Estanislado Pandy, labs  _________________________________   HISTORICAL  CHIEF COMPLAINT:  Chief Complaint  Patient presents with   Room 11    Pt is here Alone. Pt states that things have been going good.     HISTORY OF PRESENT ILLNESS:  Kristy Perez is a 72 y.o. woman on Orencia infusions for her rheumatoid arthritis.  Update 04/13/2022: She is on Orencia for rheumatoid arthritis. She gets the labwork every other month and results are stable (lab work is done with rheumatology) .   She tolerates it well.  She feels her rheumatoid arthritis symptoms have improved.  She continues to have some pain in the feet and neck.  Some neck stiffness.  She has ulnar deviation on her hands form RA.   Also has OA at base of thumb.     In 2020 she had L2-L3 lumbar surgery which has helped symptoms in her back and legs.   She has mild numbness in her fingertips bilaterally. She has mold reduced strength in her hands.   She notes a riht foot drop if she walks longer.   She has mild numbness in toes.    She had breast cancer 20 years ago.   She has had basal cell cancer several times on face.      SHe walks 4-5 miles daily.    She also has a mild Chiari malformation. She denies headaches or syncope. She does note that if she calls she gets a headache. She has had some hoarseness of her voice and has seen ENT. The vocal cords look fine.  .    I reviewed her 2022 MRI.  also has some degenerative pannus at C1-C2 and more typical DJD in the mid cervical spine.  There is no significant spinal stenosis.  No nerve root compression..     She has 3-4 mm of cerebellar ectopia.  She gets brain or cervical spine studies periodically to follow this as she is at risk due to her rheumatoid arthritis and potential pannus development. Adjacent brain appears normal.  Due to the  numbness we had checked  NCV/EMG 04/01/2020.  The study showed the following: 1.  No evidence of polyneuropathy 2.  Borderline median neuropathy across the right wrist.  There is no evidence of right focal ulnar neuropathy. 3.  Mild right L3 (+/- L4),  L5 and S1 chronic radiculopathies  Allergies:   Iodine/fish - anaphylaxis Boniva, Fosamax - vomiting Paxlovid - vomiting Sulfa - tongue turned black but no anaphylaxis  Rheumatoid arthritis history: She had had problems with joint pain, especially in her feet times many years. Around 2002, she was diagnosed with rheumatoid arthritis after a rheumatoid factor returned positive. He was to start methotrexate and Plaquenil but was diagnosed with breast cancer around that time so those medicines were discontinued while she was undergoing treatment with breast cancer. She had a complete response to her treatment (surgery, chemotherapy and radiation) remains cancer free.   After her chemotherapy, she went back on plaquenil until plus methotrexate injections. About 12 years ago, she switched to Humira and was on that for about 8 years. She had many dental issues including abscesses and it was discontinued. Enbrel was tried but it was less effective for her RA. About 4 years ago she started Orencia infusions. She has tolerated them well and has not had any problems with infections.  Typically, the day of the infusion she will fill tired or achy. The next day she may also feel tired. The rest of the month she does well..    She is on Orencia 500 mg IV every 4 weeks.    She had her last infusion 06/15/2016.     She has allergies to Iodine injection (xray/CT) and fish.  She does ok with Betadine.    Besides breast cancer, she has had had several Basal cell and squamous cell cancers.   She had Mohs surgery on her face.      Chiari malformation history: She also has had a Chiari Malformation and was seeing Dr. Carloyn Manner of Neurosurgery.   Because she also has risk of  pannus development from her rheumatoid arthritis, and the MRI on an annual or near annual basis.   Imaging: MRI of her brain January 2018 shows a borderline Chiari malformation (looked mild on prior MRI). The adjacent brain and spinal cord have normal signal.  She has some degenerative pannus at C1-C2.  MRI of the lumbar spine 06/20/2019 shows postoperative changes at L2-L3 (right extraforaminal microdiscectomy) there is stable grade 1 spondylolisthesis due to pars defects at L5 and associated with facet hypertrophy and foraminal narrowing.  MRI cervical spine 04/03/2020 showed :  The spinal cord appears normal.    The cerebellar tonsils extend just below the foramen magnum but not enough to be considered a Chiari malformation.  There is no stenosis at the foramen magnum.   Mild multilevel degenerative changes as detailed above that do not lead to nerve root compression or significant spinal stenosis..   Degenerative changes show no significant progression compared to the 09/22/2004 MRI.  REVIEW OF SYSTEMS: Constitutional: No fevers, chills, sweats, or change in appetite.    She gets fatigue the last week of each Orencia cycle.     Eyes: No visual changes, double vision, eye pain Ear, nose and throat: No hearing loss, ear pain, nasal congestion, sore throat Cardiovascular: No chest pain, palpitations Respiratory:  No shortness of breath at rest or with exertion.   No wheezes GastrointestinaI: No nausea, vomiting, diarrhea, abdominal pain, fecal incontinence.   She has GERD Genitourinary:  No dysuria, urinary retention.   She notes urinary frequency and nocturia. Musculoskeletal:  as above Integumentary: No rash, pruritus, skin lesions Neurological: as above.   She notes decreased focus, improved with Adderall Psychiatric: No depression at this time.  No anxiety Endocrine: No palpitations, diaphoresis, change in appetite, change in weigh or increased thirst Hematologic/Lymphatic:  No anemia, purpura,  petechiae. Allergic/Immunologic: No itchy/runny eyes, nasal congestion, recent allergic reactions, rashes  ALLERGIES: Allergies  Allergen Reactions   Fish Allergy Anaphylaxis   Iodine Anaphylaxis    " PER PT IV CONTRAST"   Boniva [Ibandronic Acid]     vomiting    Fosamax [Alendronate]     vomiting    Paxlovid [Nirmatrelvir-Ritonavir]    Sulfonamide Derivatives Other (See Comments)    Tongue turns black.    HOME MEDICATIONS:  Current Outpatient Medications:    Abatacept (ORENCIA IV), Inject 500 mg into the vein every 30 (thirty) days. Infuse 2 vials / 500 mg over 30 minutes every 4 weeks, Disp: , Rfl:    acetaminophen (TYLENOL) 325 MG tablet, Take 650 mg by mouth daily as needed (One hour prior to infusion). , Disp: , Rfl:    ADVAIR HFA 45-21 MCG/ACT inhaler, SMARTSIG:2 Puff(s) By Mouth Twice Daily, Disp: , Rfl:    albuterol (VENTOLIN HFA)  108 (90 Base) MCG/ACT inhaler, Inhale 2 puffs into the lungs every 6 (six) hours as needed for wheezing or shortness of breath., Disp: , Rfl:    amoxicillin (AMOXIL) 500 MG tablet, Before dental procedures, Disp: , Rfl:    aspirin EC 81 MG tablet, Take 1 tablet (81 mg total) by mouth daily., Disp: 90 tablet, Rfl: 3   calcium carbonate ([employer] CALCIUM) 600 MG tablet, Take 600 mg by mouth 2 (two) times daily with a meal., Disp: , Rfl:    chlorpheniramine (CHLOR-TRIMETON) 4 MG tablet, Take 4 mg by mouth daily as needed for allergies., Disp: , Rfl:    diphenhydrAMINE (BENADRYL) 25 mg capsule, Take 25 mg by mouth daily as needed (One hour prior to infusion). , Disp: , Rfl:    famotidine (PEPCID) 20 MG tablet, One after  bfast and supper, Disp: 60 tablet, Rfl: 11   Flaxseed, Linseed, (FLAX SEED OIL PO), Flax Seed Oil, Disp: , Rfl:    ibuprofen (ADVIL,MOTRIN) 200 MG tablet, Take 400 mg by mouth every 6 (six) hours as needed (for inflammation)., Disp: , Rfl:    Liniments (SALONPAS EX), Apply topically as needed., Disp: , Rfl:    Magnesium 250 MG TABS, Take  500-1,000 tablets by mouth daily. , Disp: , Rfl:    NATURAL VITAMIN D-3 125 MCG (5000 UT) TABS, TAKE 1 TABLET BY MOUTH EVERY DAY, Disp: 200 tablet, Rfl: 1   NONFORMULARY OR COMPOUNDED ITEM, Estradiol 0.02% vaginal cream.  Place 1/2 gram vaginally two to three times weekly at bedtime. Disp:  3 month supply (Patient taking differently: Place 0.5 Applicatorfuls vaginally once a week. Estradiol 0.02% vaginal cream.  Place 1/2 gram vaginally two to three times weekly at bedtime. Disp:  3 month supply), Disp: 1 each, Rfl: 3   promethazine (PHENERGAN) 25 MG tablet, Take 25 mg by mouth every 6 (six) hours as needed for nausea or vomiting., Disp: , Rfl:    RESTASIS 0.05 % ophthalmic emulsion, Place 1 drop into both eyes every morning. , Disp: , Rfl:    rosuvastatin (CRESTOR) 5 MG tablet, Take 5 mg by mouth every other day., Disp: , Rfl:    Sodium Fluoride 1.1 % PSTE, Place 1 application onto teeth 2 (two) times daily. , Disp: , Rfl:    temazepam (RESTORIL) 30 MG capsule, Take 30 mg by mouth at bedtime. , Disp: , Rfl:    amphetamine-dextroamphetamine (ADDERALL) 5 MG tablet, Take 10 mg by mouth daily as needed (stress).  (Patient not taking: Reported on 04/13/2022), Disp: , Rfl: 0  PAST MEDICAL HISTORY: Past Medical History:  Diagnosis Date   Anemia    Asthma    inhaler one x per day   Breast cancer ([place])    Cancer ([place])    breast, left   Chiari malformation    Difficult intubation    cannot hyperextend neck due to Chiari malformation with tonsillar involvement, RA   Dysrhythmia 2015   history of PAC's   GERD (gastroesophageal reflux disease)    High cholesterol    dx by PCP per patient    Personal history of radiation therapy    PONV (postoperative nausea and vomiting)    Rheumatoid arthritis ([place])    on Humira    PAST SURGICAL HISTORY: Past Surgical History:  Procedure Laterality Date   BREAST SURGERY Left 2003   Lumpectomy T1C1, NO and negative ER/ PR   COLONOSCOPY  10/04   COLONOSCOPY   11/10   normal recheck in  5 years   COLPORRHAPHY     ESOPHAGOGASTRODUODENOSCOPY ENDOSCOPY  11/10   GERD   FOOT SURGERY Left age 14   removal of pseudo rheumatoid nodules from left forefoot.   LAPAROSCOPIC BILATERAL SALPINGO OOPHERECTOMY N/A 07/30/2013   Procedure: LAPAROSCOPIC BILATERAL SALPINGO OOPHORECTOMY WITH WASHINGS;  Surgeon: Lyman Speller, MD;  Location: [facility];  Service: Gynecology;  Laterality: N/A;   LUMBAR LAMINECTOMY/DECOMPRESSION MICRODISCECTOMY Right 04/06/2018   Procedure: Extraforaminal Microdiscectomy - Lumbar Two-Lumbar Three - right;  Surgeon: Eustace Covello, MD;  Location: [facility];  Service: Neurosurgery;  Laterality: Right;  Extraforaminal Microdiscectomy - Lumbar Two-Lumbar Three - right   NASAL SEPTOPLASTY W/ TURBINOPLASTY Bilateral 05/24/2014   Procedure: NASAL SEPTOPLASTY WITH  BILATERAL TURBINATE REDUCTION;  Surgeon: Jerrell Belfast, MD;  Location: [facility];  Service: ENT;  Laterality: Bilateral;   PELVIC FLOOR REPAIR  3/04   [facility] with AP colporrhaphy   PILONIDAL CYST EXCISION     TOTAL VAGINAL HYSTERECTOMY  1989   with AP repair    FAMILY HISTORY: Family History  Problem Relation Age of Onset   Diabetes Mother    Heart failure Mother    Bladder Cancer Mother    Stroke Mother    Cancer Father        head and neck   Uterine cancer Maternal Aunt    Bladder Cancer Maternal Grandmother    Colon cancer Maternal Grandmother    Breast cancer Cousin     SOCIAL HISTORY:  Social History   Socioeconomic History   Marital status: Divorced    Spouse name: Not on file   Number of children: 2   Years of education: Not on file   Highest education level: Not on file  Occupational History   Not on file  Tobacco Use   Smoking status: Former    Packs/day: 1.50    Years: 30.00    Total pack years: 45.00    Types: Cigarettes    Quit date: 02/16/1999    Years since quitting: 23.1    Passive exposure: Past   Smokeless tobacco: Never  Vaping Use   Vaping Use:  Never used  Substance and Sexual Activity   Alcohol use: No   Drug use: No   Sexual activity: Not Currently    Partners: Male  Other Topics Concern   Not on file  Social History Narrative   Lives alone in a 2 story home. Has 2 children.  Works as a Radiation protection practitioner.  Education: college.    Social Determinants of Health   Financial Resource Strain: Not on file  Food Insecurity: Not on file  Transportation Needs: Not on file  Physical Activity: Not on file  Stress: Not on file  Social Connections: Not on file  Intimate Partner Violence: Not on file     PHYSICAL EXAM  Vitals:   04/13/22 1054  BP: 121/81  Pulse: 74  Weight: 128 lb 8 oz (58.3 kg)  Height: '5\' 1"'$  (1.549 m)    Body mass index is 24.28 kg/m.   General: The patient is well-developed and well-nourished and in no acute distress   Neck:  The neck is nontender.   Skin: Extremities are without rash or edema.  Musculoskeletal:  Back is nontender.   Joints in her feet and hands are tender.   She has ulnar deviation of the fingers bilaterally, much worse on the right.  She has a nodule on the right wrist.  enlarged CMC joint on right.   Neurologic  Exam  Mental status: The patient is alert and oriented x 3 at the time of the examination. The patient has apparent normal recent and remote memory, with an apparently normal attention span and concentration ability.   Speech is normal.  Cranial nerves: Extraocular movements are full. Facial strength and sensation is normal.  Trapezius and sternocleidomastoid strength is normal.  . No obvious hearing deficits are noted.  Motor:  Muscle tone is normal. Muscle bulk is reduced in the ulnar innervated right hand muscles.  3 to 4-/5 right ulnar hand muscle strength.  4+/5 bilateral APB muscles (median) 4/5 right EHL and 4+/5 right ankle extensors .  4+/5 left EHL.     Strength is 5/5.elsewhere  Sensory;  She has. Reduced touch but normal vibration in fingertips, superimpose reduced  sensation to pp hypothenar eminence, right worse than left.  Reduced sensation to vibration in toes (near absent but normal at ankles).  Reduced pp in toes adjacent feet, right peroneal/L5 distribution moreso than lateral foot   Just mildly redued pinprick plantars.    Coordination: Cerebellar testing reveals good finger-nose-finger bilaterally.   Mild reduced coordination in hands and toe tap.    Gait and station: Station is normal.  Gait is mildly ataxic, arthritic and she has a very mild right foot drop.  Her tandem gait is mildly wide.  The Romberg is negative.  Reflexes: Deep tendon reflexes are symmetric and normal bilaterally except absnet at ankles.Marland Kitchen        DIAGNOSTIC DATA (LABS, IMAGING, TESTING) - I reviewed patient records, labs, notes, testing and imaging myself where available.  Lab Results  Component Value Date   WBC 7.4 04/08/2022   HGB 12.9 04/08/2022   HCT 39.7 04/08/2022   MCV 87 04/08/2022   PLT 379 04/08/2022      Component Value Date/Time   NA 140 04/08/2022 1321   K 4.1 04/08/2022 1321   CL 102 04/08/2022 1321   CO2 22 04/08/2022 1321   GLUCOSE 93 04/08/2022 1321   GLUCOSE 91 03/30/2018 1051   BUN 16 04/08/2022 1321   CREATININE 0.54 (L) 04/08/2022 1321   CALCIUM 9.4 04/08/2022 1321   PROT 6.6 04/08/2022 1321   ALBUMIN 4.6 04/08/2022 1321   AST 19 04/08/2022 1321   ALT 19 04/08/2022 1321   ALKPHOS 70 04/08/2022 1321   BILITOT 0.3 04/08/2022 1321   GFRNONAA 98 03/31/2020 1049   GFRAA 114 03/31/2020 1049   Lab Results  Component Value Date   CHOL 228 (H) 07/07/2017   HDL 68 07/07/2017   LDLCALC 142 (H) 07/07/2017   TRIG 91 07/07/2017   CHOLHDL 3.4 07/07/2017   Lab Results  Component Value Date   HGBA1C 5.2 03/08/2016   Lab Results  Component Value Date   VITAMINB12 280 03/31/2020   Lab Results  Component Value Date   TSH 1.20 03/08/2016       ASSESSMENT AND PLAN  Rheumatoid arthritis with rheumatoid factor of multiple sites  without organ or systems involvement ([place])  High risk medication use  Idiopathic progressive neuropathy  History of Chiari malformation  Osteoarthritis of cervical spine, unspecified spinal osteoarthritis complication status    1.    She will continue Orencia 500 mg every 4 weeks.  Labs will be checked every 8 weeks and have looked good. 2.    The Chiari malformation and the degenerative pannus at C1-C2 appear stable.  There does not appear to be any pressure on the spinal cord.  She has  no significant stenosis at the cervicomedullary junction. 3.    return in one year for general visit in regards to infusion.   She will call sooner if she has any new or worsening neurologic symptoms.    Janel Beane A. Felecia Shelling, MD, PhD [id number], [id number] AM Certified in Neurology, Clinical Neurophysiology, Sleep Medicine, Pain Medicine and Neuroimaging  [facility] [address], [place], [place] [address] [phone]

## 2022-04-22 ENCOUNTER — Encounter: Payer: Self-pay | Admitting: Rheumatology

## 2022-04-22 ENCOUNTER — Ambulatory Visit: Payer: Medicare Other | Attending: Rheumatology | Admitting: Rheumatology

## 2022-04-22 VITALS — BP 95/60 | HR 80 | Resp 14 | Ht 61.0 in | Wt 128.0 lb

## 2022-04-22 DIAGNOSIS — M0579 Rheumatoid arthritis with rheumatoid factor of multiple sites without organ or systems involvement: Secondary | ICD-10-CM | POA: Diagnosis not present

## 2022-04-22 DIAGNOSIS — Z8669 Personal history of other diseases of the nervous system and sense organs: Secondary | ICD-10-CM

## 2022-04-22 DIAGNOSIS — M5136 Other intervertebral disc degeneration, lumbar region: Secondary | ICD-10-CM | POA: Insufficient documentation

## 2022-04-22 DIAGNOSIS — Z8589 Personal history of malignant neoplasm of other organs and systems: Secondary | ICD-10-CM | POA: Diagnosis not present

## 2022-04-22 DIAGNOSIS — M5030 Other cervical disc degeneration, unspecified cervical region: Secondary | ICD-10-CM | POA: Diagnosis not present

## 2022-04-22 DIAGNOSIS — M810 Age-related osteoporosis without current pathological fracture: Secondary | ICD-10-CM

## 2022-04-22 DIAGNOSIS — E559 Vitamin D deficiency, unspecified: Secondary | ICD-10-CM | POA: Diagnosis not present

## 2022-04-22 DIAGNOSIS — F5101 Primary insomnia: Secondary | ICD-10-CM

## 2022-04-22 DIAGNOSIS — Z853 Personal history of malignant neoplasm of breast: Secondary | ICD-10-CM

## 2022-04-22 DIAGNOSIS — Z85828 Personal history of other malignant neoplasm of skin: Secondary | ICD-10-CM | POA: Diagnosis not present

## 2022-04-22 DIAGNOSIS — M51369 Other intervertebral disc degeneration, lumbar region without mention of lumbar back pain or lower extremity pain: Secondary | ICD-10-CM

## 2022-04-22 DIAGNOSIS — Z8709 Personal history of other diseases of the respiratory system: Secondary | ICD-10-CM

## 2022-04-22 DIAGNOSIS — M7061 Trochanteric bursitis, right hip: Secondary | ICD-10-CM

## 2022-04-22 DIAGNOSIS — K219 Gastro-esophageal reflux disease without esophagitis: Secondary | ICD-10-CM | POA: Diagnosis not present

## 2022-04-22 DIAGNOSIS — Z79899 Other long term (current) drug therapy: Secondary | ICD-10-CM

## 2022-04-22 NOTE — Patient Instructions (Signed)
Vaccines You are taking a medication(s) that can suppress your immune system.  The following immunizations are recommended: Flu annually Covid-19  RSV Td/Tdap (tetanus, diphtheria, pertussis) every 10 years Pneumonia (Prevnar 15 then Pneumovax 23 at least 1 year apart.  Alternatively, can take Prevnar 20 without needing additional dose) Shingrix: 2 doses from 4 weeks to 6 months apart  Please check with your PCP to make sure you are up to date.   If you have signs or symptoms of an infection or start antibiotics: First, call your PCP for workup of your infection. Hold your medication through the infection, until you complete your antibiotics, and until symptoms resolve if you take the following: Injectable medication (Actemra, Benlysta, Cimzia, Cosentyx, Enbrel, Humira, Kevzara, Orencia, Remicade, Simponi, Stelara, Taltz, Tremfya) Methotrexate Leflunomide (Arava) Mycophenolate (Cellcept) Morrie Sheldon, Olumiant, or Rinvoq

## 2022-04-23 NOTE — Progress Notes (Signed)
Vitamin D is normal.  Patient should continue her current dose of vitamin D.

## 2022-04-25 LAB — QUANTIFERON-TB GOLD PLUS
Mitogen-NIL: 8.52 IU/mL
NIL: 0.02 IU/mL
QuantiFERON-TB Gold Plus: NEGATIVE
TB1-NIL: 0 IU/mL
TB2-NIL: 0.01 IU/mL

## 2022-04-25 LAB — VITAMIN D 25 HYDROXY (VIT D DEFICIENCY, FRACTURES): Vit D, 25-Hydroxy: 38 ng/mL (ref 30–100)

## 2022-04-25 NOTE — Progress Notes (Signed)
TB Gold is negative.

## 2022-05-02 IMAGING — MR MR LUMBAR SPINE W/O CM
5 series · 48 of 48 positions shown · non-contrast
Comparison: MRI 01/04/2018

CLINICAL DATA: Chronic low back pain with left hip and left buttock
pain for 6 months.

EXAM:
MRI LUMBAR SPINE WITHOUT CONTRAST
TECHNIQUE: Multiplanar, multisequence MR imaging of the lumbar spine was
performed. No intravenous contrast was administered.

[Series 3: T2 post-contrast · sagittal · 4.0mm · 0.88mm/px · 6 of 12 slices shown]
[im 1/12]
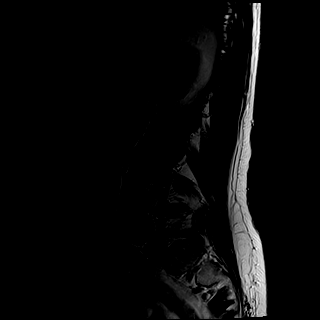
[im 3/12]
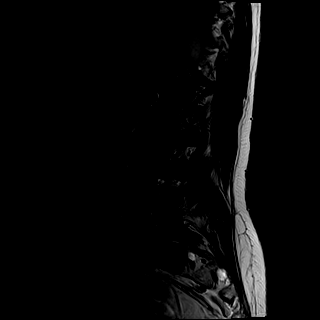
[im 5/12]
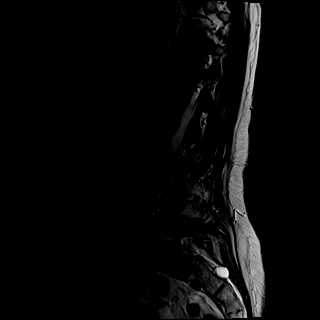
[im 7/12]
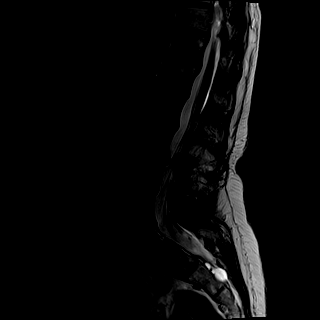
[im 9/12]
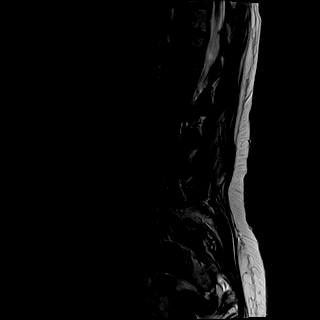
[im 12/12]
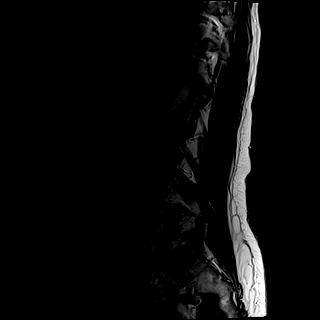

[Series 4: T1 · sagittal · 4.0mm · 0.88mm/px · 6 of 12 slices shown (1 of 2)]
[im 1/12]
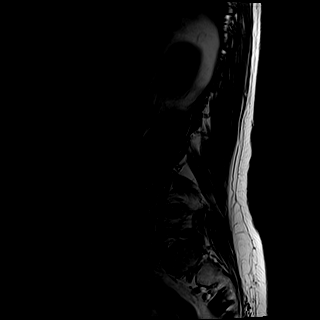
[im 3/12]
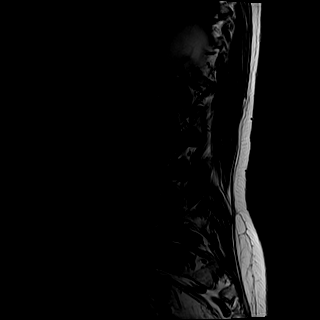
[im 5/12]
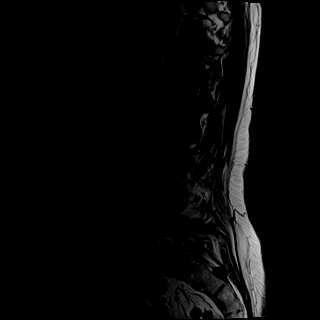
[im 7/12]
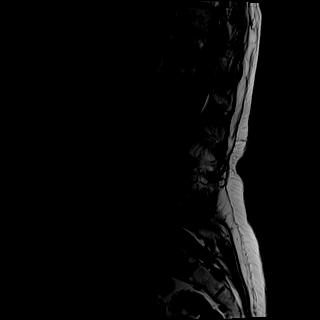
[im 9/12]
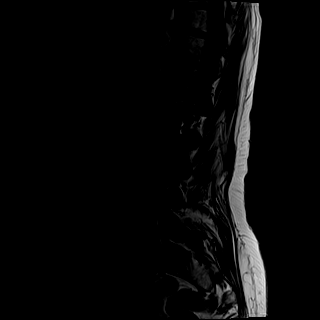
[im 12/12]
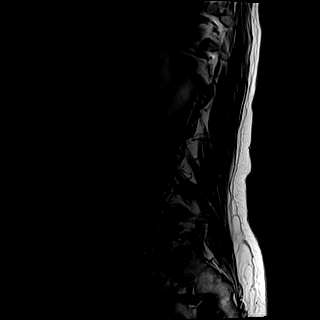

[Series 5: tirm sag · sagittal · 4.0mm · 0.55mm/px · 6 of 12 slices shown]
[im 1/12]
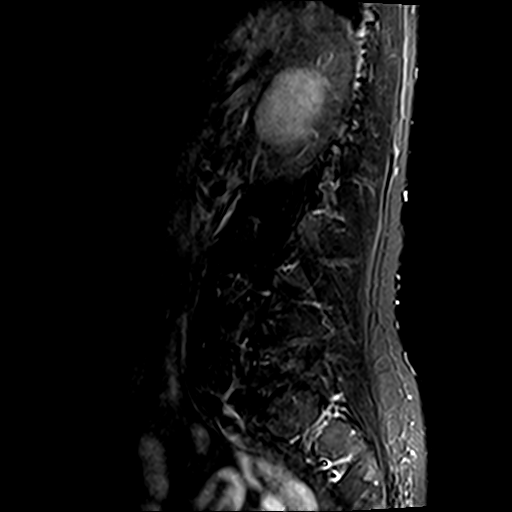
[im 3/12]
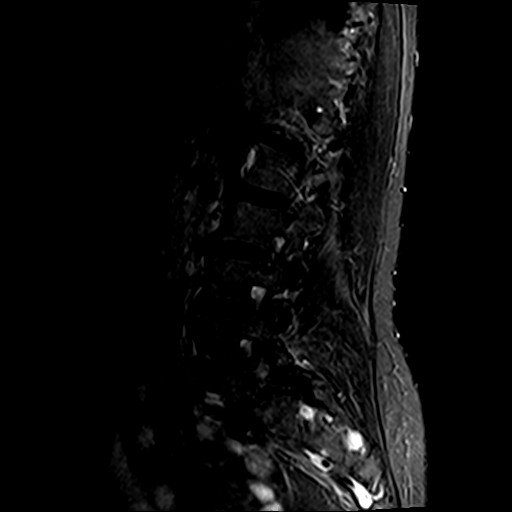
[im 5/12]
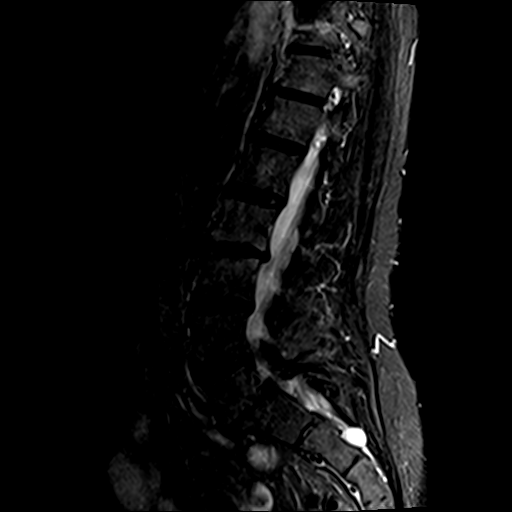
[im 7/12]
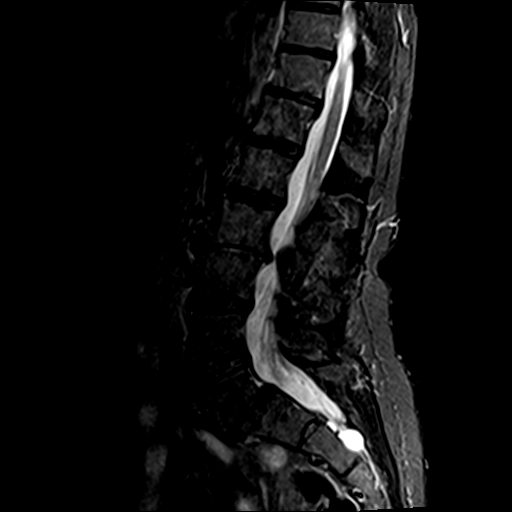
[im 9/12]
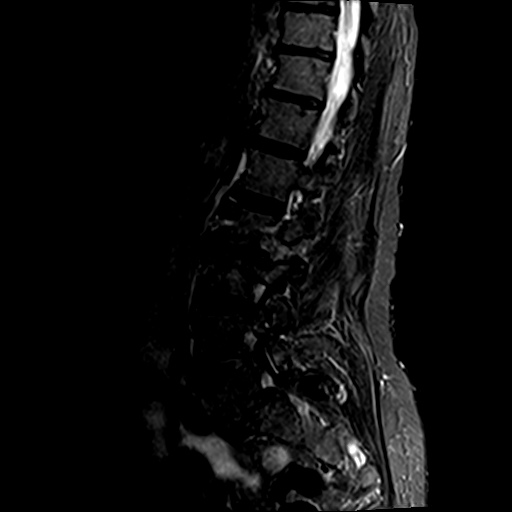
[im 12/12]
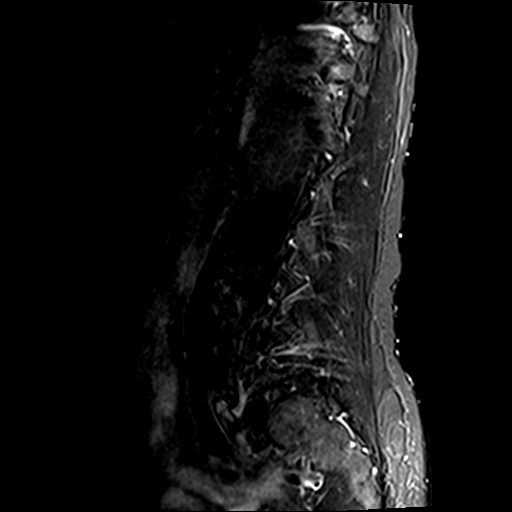

[Series 6: T1 · axial · 4.0mm · 1.04mm/px · z∈[-50,+150]mm · 15 of 32 slices shown (2 of 2)]
[im 1/32]
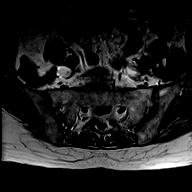
[im 3/32]
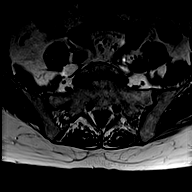
[im 5/32]
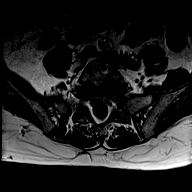
[im 7/32]
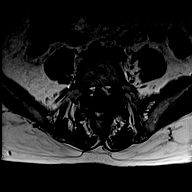
[im 9/32]
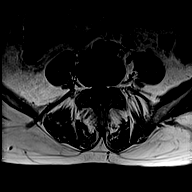
[im 12/32]
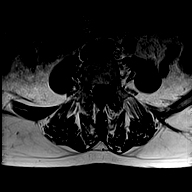
[im 14/32]
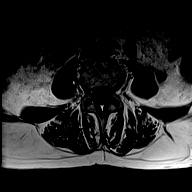
[im 16/32]
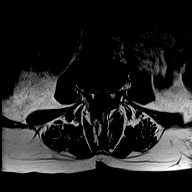
[im 18/32]
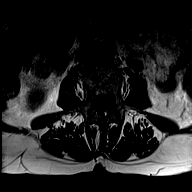
[im 20/32]
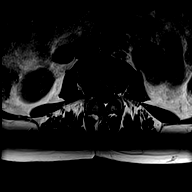
[im 23/32]
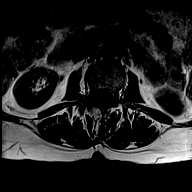
[im 25/32]
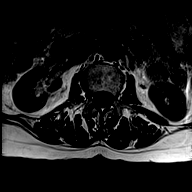
[im 27/32]
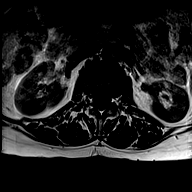
[im 29/32]
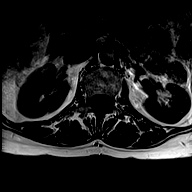
[im 32/32]
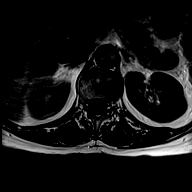

[Series 7: T2 · axial · 4.0mm · 1.04mm/px · z∈[-50,+150]mm · 15 of 32 slices shown]
[im 1/32]
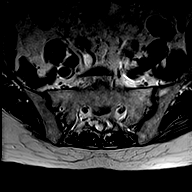
[im 3/32]
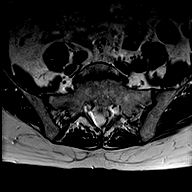
[im 5/32]
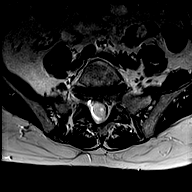
[im 7/32]
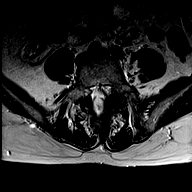
[im 9/32]
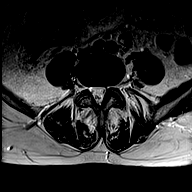
[im 12/32]
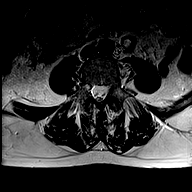
[im 14/32]
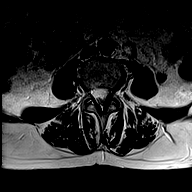
[im 16/32]
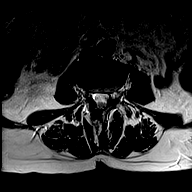
[im 18/32]
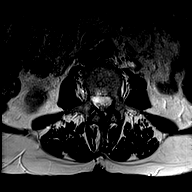
[im 20/32]
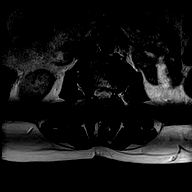
[im 23/32]
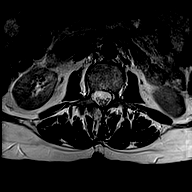
[im 25/32]
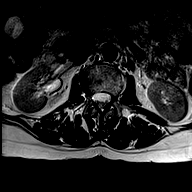
[im 27/32]
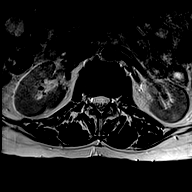
[im 29/32]
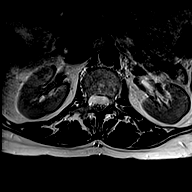
[im 32/32]
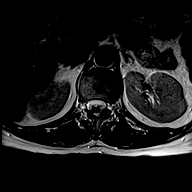

[48 of 48 positions shown; findings below may reference images not displayed]

FINDINGS: Segmentation: There are five lumbar type vertebral bodies. The last
full intervertebral disc space is labeled L5-S1. This corresponds to
the prior study.

Alignment: Stable pars defects at L5 with a grade 1
spondylolisthesis. The other lumbar vertebral bodies are normally
aligned.

Vertebrae: Stable endplate reactive changes at L2-3 and L5-S1. No
bone lesions or fractures.

Conus medullaris and cauda equina: Conus extends to the L1 level.
Conus and cauda equina appear normal.

Paraspinal and other soft tissues: No significant paraspinal or
retroperitoneal findings.

Disc levels:

T12-L1: No significant findings.

L1-2: No significant findings.

L2-3: Advanced degenerative disc disease and moderate facet disease.
Evidence of prior right-sided extraforaminal microdiscectomy. No
findings for recurrent foraminal disc protrusion. Mild osteophytic
ridging. Mild right lateral recess stenosis.

L3-4: Mild to moderate facet disease but no disc protrusions, spinal
or foraminal stenosis.

L4-5: Moderate facet disease but no disc protrusions, spinal or
foraminal stenosis.

L5-S1: Bilateral pars defects and stable anterolisthesis of L5.
Bulging uncovered disc and severe facet disease but no significant
spinal stenosis. Stable bilateral foraminal stenosis.
IMPRESSION: 1. Postoperative changes at L2-3 with a right-sided extraforaminal
microdiscectomy. No findings for recurrent disc protrusion. Mild
right lateral recess stenosis and mild osteophytic ridging.
2. Stable bilateral pars defects at L5 with a grade 1
spondylolisthesis and associated severe facet disease and bilateral
foraminal stenosis.

## 2022-05-05 DIAGNOSIS — M0519 Rheumatoid lung disease with rheumatoid arthritis of multiple sites: Secondary | ICD-10-CM | POA: Diagnosis not present

## 2022-06-02 ENCOUNTER — Other Ambulatory Visit: Payer: Self-pay

## 2022-06-02 ENCOUNTER — Other Ambulatory Visit: Payer: Self-pay | Admitting: *Deleted

## 2022-06-02 DIAGNOSIS — M0579 Rheumatoid arthritis with rheumatoid factor of multiple sites without organ or systems involvement: Secondary | ICD-10-CM

## 2022-06-02 DIAGNOSIS — Z79899 Other long term (current) drug therapy: Secondary | ICD-10-CM | POA: Diagnosis not present

## 2022-06-02 DIAGNOSIS — R200 Anesthesia of skin: Secondary | ICD-10-CM

## 2022-06-02 DIAGNOSIS — M0519 Rheumatoid lung disease with rheumatoid arthritis of multiple sites: Secondary | ICD-10-CM | POA: Diagnosis not present

## 2022-06-09 LAB — COMPREHENSIVE METABOLIC PANEL

## 2022-06-09 LAB — CBC WITH DIFFERENTIAL/PLATELET
Basophils Absolute: 0 10*3/uL (ref 0.0–0.2)
Basos: 1 %
EOS (ABSOLUTE): 0.4 10*3/uL (ref 0.0–0.4)
Eos: 6 %
Hematocrit: 39.3 % (ref 34.0–46.6)
Hemoglobin: 13 g/dL (ref 11.1–15.9)
Immature Grans (Abs): 0 10*3/uL (ref 0.0–0.1)
Immature Granulocytes: 0 %
Lymphocytes Absolute: 2.1 10*3/uL (ref 0.7–3.1)
Lymphs: 27 %
MCH: 28.8 pg (ref 26.6–33.0)
MCHC: 33.1 g/dL (ref 31.5–35.7)
MCV: 87 fL (ref 79–97)
Monocytes Absolute: 0.5 10*3/uL (ref 0.1–0.9)
Monocytes: 7 %
Neutrophils Absolute: 4.8 10*3/uL (ref 1.4–7.0)
Neutrophils: 59 %
Platelets: 374 10*3/uL (ref 150–450)
RBC: 4.52 x10E6/uL (ref 3.77–5.28)
RDW: 12.7 % (ref 11.7–15.4)
WBC: 7.9 10*3/uL (ref 3.4–10.8)

## 2022-06-10 ENCOUNTER — Telehealth: Payer: Self-pay | Admitting: *Deleted

## 2022-06-10 NOTE — Telephone Encounter (Signed)
Called and left message for pt about lab results per Dr. Bonnita Hollow note. Advised I will have medical records send her a copy of labs that came back. Asked her to call back if she has any further questions.

## 2022-06-10 NOTE — Telephone Encounter (Signed)
-----   Message from Asa Lente, MD sent at 06/09/2022  7:26 PM EDT ----- Please let her know that the blood counts were fine.  There was a problem with collection of the liver function test in the lab canceled it.  Since it has been doing well for the past couple years we can just make sure to get when we check labs again

## 2022-06-30 DIAGNOSIS — M0519 Rheumatoid lung disease with rheumatoid arthritis of multiple sites: Secondary | ICD-10-CM | POA: Diagnosis not present

## 2022-07-29 ENCOUNTER — Other Ambulatory Visit: Payer: Self-pay

## 2022-07-29 ENCOUNTER — Other Ambulatory Visit: Payer: Self-pay | Admitting: *Deleted

## 2022-07-29 DIAGNOSIS — M0579 Rheumatoid arthritis with rheumatoid factor of multiple sites without organ or systems involvement: Secondary | ICD-10-CM | POA: Diagnosis not present

## 2022-07-29 DIAGNOSIS — Z79899 Other long term (current) drug therapy: Secondary | ICD-10-CM | POA: Diagnosis not present

## 2022-07-29 DIAGNOSIS — M0519 Rheumatoid lung disease with rheumatoid arthritis of multiple sites: Secondary | ICD-10-CM | POA: Diagnosis not present

## 2022-07-30 LAB — CBC WITH DIFFERENTIAL/PLATELET
Basophils Absolute: 0 10*3/uL (ref 0.0–0.2)
Basos: 0 %
EOS (ABSOLUTE): 0.5 10*3/uL — ABNORMAL HIGH (ref 0.0–0.4)
Eos: 6 %
Hematocrit: 38.5 % (ref 34.0–46.6)
Hemoglobin: 12.8 g/dL (ref 11.1–15.9)
Immature Grans (Abs): 0 10*3/uL (ref 0.0–0.1)
Immature Granulocytes: 0 %
Lymphocytes Absolute: 2.1 10*3/uL (ref 0.7–3.1)
Lymphs: 27 %
MCH: 28.5 pg (ref 26.6–33.0)
MCHC: 33.2 g/dL (ref 31.5–35.7)
MCV: 86 fL (ref 79–97)
Monocytes Absolute: 0.5 10*3/uL (ref 0.1–0.9)
Monocytes: 6 %
Neutrophils Absolute: 4.6 10*3/uL (ref 1.4–7.0)
Neutrophils: 61 %
Platelets: 376 10*3/uL (ref 150–450)
RBC: 4.49 x10E6/uL (ref 3.77–5.28)
RDW: 12.8 % (ref 11.7–15.4)
WBC: 7.8 10*3/uL (ref 3.4–10.8)

## 2022-07-30 LAB — COMPREHENSIVE METABOLIC PANEL
ALT: 19 IU/L (ref 0–32)
AST: 21 IU/L (ref 0–40)
Albumin/Globulin Ratio: 1.9
Albumin: 4.3 g/dL (ref 3.9–4.9)
Alkaline Phosphatase: 82 IU/L (ref 44–121)
BUN/Creatinine Ratio: 29 — ABNORMAL HIGH (ref 12–28)
BUN: 15 mg/dL (ref 8–27)
Bilirubin Total: 0.4 mg/dL (ref 0.0–1.2)
CO2: 21 mmol/L (ref 20–29)
Calcium: 9.3 mg/dL (ref 8.7–10.3)
Chloride: 103 mmol/L (ref 96–106)
Creatinine, Ser: 0.51 mg/dL — ABNORMAL LOW (ref 0.57–1.00)
Globulin, Total: 2.3 g/dL (ref 1.5–4.5)
Glucose: 96 mg/dL (ref 70–99)
Potassium: 4.4 mmol/L (ref 3.5–5.2)
Sodium: 140 mmol/L (ref 134–144)
Total Protein: 6.6 g/dL (ref 6.0–8.5)
eGFR: 100 mL/min/{1.73_m2} (ref >59)

## 2022-08-04 ENCOUNTER — Ambulatory Visit: Payer: Medicare Other | Admitting: Cardiology

## 2022-08-04 ENCOUNTER — Encounter: Payer: Self-pay | Admitting: Cardiology

## 2022-08-04 VITALS — BP 126/55 | HR 56 | Ht 61.0 in | Wt 129.0 lb

## 2022-08-04 DIAGNOSIS — R0609 Other forms of dyspnea: Secondary | ICD-10-CM

## 2022-08-04 DIAGNOSIS — R002 Palpitations: Secondary | ICD-10-CM | POA: Diagnosis not present

## 2022-08-04 DIAGNOSIS — I700 Atherosclerosis of aorta: Secondary | ICD-10-CM

## 2022-08-04 DIAGNOSIS — E7800 Pure hypercholesterolemia, unspecified: Secondary | ICD-10-CM | POA: Diagnosis not present

## 2022-08-04 NOTE — Progress Notes (Signed)
Primary Physician/Referring:  Ollen Bowl, MD  Patient ID: Kristy Perez, female    DOB: 1950-05-24, 72 y.o.   MRN: [id number]  Chief Complaint  Patient presents with   Aortic atherosclerosis   Follow-up   HPI:    Kristy Perez  is a 72 y.o. Caucasian female patient with seropositive rheumatoid arthritis,  GERD and chronic dyspnea on exertion due to reactive airway disease and COPD, with >40-pack-year history of smoking quit since 2001, hypercholesterolemia and history of left breast cancer status post lumpectomy followed by chemo and radiation therapy in 2003.  Except for occasional palpitations and chronic dyspnea, no new symptoms.  Symptoms are remained stable.  No PND or orthopnea, no leg edema.  Past Medical History:  Diagnosis Date   Anemia    Asthma    inhaler one x per day   Breast cancer (HCC)    Cancer (HCC)    breast, left   Chiari malformation    Difficult intubation    cannot hyperextend neck due to Chiari malformation with tonsillar involvement, RA   Dysrhythmia 2015   history of PAC's   GERD (gastroesophageal reflux disease)    High cholesterol    dx by PCP per patient    Personal history of radiation therapy    PONV (postoperative nausea and vomiting)    Rheumatoid arthritis (HCC)    on Humira   Past Surgical History:  Procedure Laterality Date   BREAST SURGERY Left 2003   Lumpectomy T1C1, NO and negative ER/ PR   COLONOSCOPY  10/04   COLONOSCOPY  11/10   normal recheck in 5 years   COLPORRHAPHY     ESOPHAGOGASTRODUODENOSCOPY ENDOSCOPY  11/10   GERD   FOOT SURGERY Left age 53   removal of pseudo rheumatoid nodules from left forefoot.   LAPAROSCOPIC BILATERAL SALPINGO OOPHERECTOMY N/A 07/30/2013   Procedure: LAPAROSCOPIC BILATERAL SALPINGO OOPHORECTOMY WITH WASHINGS;  Surgeon: Annamaria Boots, MD;  Location: [facility];  Service: Gynecology;  Laterality: N/A;   LUMBAR LAMINECTOMY/DECOMPRESSION MICRODISCECTOMY Right 04/06/2018   Procedure:  Extraforaminal Microdiscectomy - Lumbar Two-Lumbar Three - right;  Surgeon: Tia Alert, MD;  Location: [facility] OR;  Service: Neurosurgery;  Laterality: Right;  Extraforaminal Microdiscectomy - Lumbar Two-Lumbar Three - right   NASAL SEPTOPLASTY W/ TURBINOPLASTY Bilateral 05/24/2014   Procedure: NASAL SEPTOPLASTY WITH  BILATERAL TURBINATE REDUCTION;  Surgeon: Osborn Coho, MD;  Location: [facility] OR;  Service: ENT;  Laterality: Bilateral;   PELVIC FLOOR REPAIR  3/04   SPARC with AP colporrhaphy   PILONIDAL CYST EXCISION     TOTAL VAGINAL HYSTERECTOMY  1989   with AP repair   Family History  Problem Relation Age of Onset   Diabetes Mother    Heart failure Mother    Bladder Cancer Mother    Stroke Mother    Cancer Father        head and neck   Uterine cancer Maternal Aunt    Bladder Cancer Maternal Grandmother    Colon cancer Maternal Grandmother    Breast cancer Cousin     Social History   Tobacco Use   Smoking status: Former    Packs/day: 1.50    Years: 30.00    Additional pack years: 0.00    Total pack years: 45.00    Types: Cigarettes    Quit date: 02/16/1999    Years since quitting: 23.4    Passive exposure: Past   Smokeless tobacco: Never  Substance Use Topics  Alcohol use: No   Marital Status: Divorced  ROS  Review of Systems  Cardiovascular:  Positive for dyspnea on exertion and palpitations. Negative for chest pain and leg swelling.   Objective      08/04/2022    8:38 AM 04/22/2022   10:42 AM 04/13/2022   10:54 AM  Vitals with BMI  Height 5\' 1"  5\' 1"  5\' 1"   Weight 129 lbs 128 lbs 128 lbs 8 oz  BMI 24.39 24.2 24.29  Systolic 126 95 121  Diastolic 55 60 81  Pulse 56 80 74    Physical Exam Neck:     Vascular: No carotid bruit or JVD.  Cardiovascular:     Rate and Rhythm: Normal rate and regular rhythm.     Pulses: Intact distal pulses.     Heart sounds: Normal heart sounds. No murmur heard.    No gallop.  Pulmonary:     Effort: Pulmonary effort is normal.      Breath sounds: Rales (faint bilateral scattered) present.  Abdominal:     General: Bowel sounds are normal.     Palpations: Abdomen is soft.  Musculoskeletal:     Right lower leg: No edema.     Left lower leg: No edema.     Laboratory examination:   Recent Labs    04/08/22 1321 06/02/22 1032 07/29/22 1002  NA 140 CANCELED 140  K 4.1 CANCELED 4.4  CL 102 CANCELED 103  CO2 22 CANCELED 21  GLUCOSE 93 CANCELED 96  BUN 16 CANCELED 15  CREATININE 0.54* CANCELED 0.51*  CALCIUM 9.4 CANCELED 9.3    Lab Results  Component Value Date   GLUCOSE 96 07/29/2022   NA 140 07/29/2022   K 4.4 07/29/2022   CL 103 07/29/2022   CO2 21 07/29/2022   BUN 15 07/29/2022   CREATININE 0.51 (L) 07/29/2022   EGFR 100 07/29/2022   CALCIUM 9.3 07/29/2022   PROT 6.6 07/29/2022   ALBUMIN 4.3 07/29/2022   LABGLOB 2.3 07/29/2022   AGRATIO 1.9 07/29/2022   BILITOT 0.4 07/29/2022   ALKPHOS 82 07/29/2022   AST 21 07/29/2022   ALT 19 07/29/2022   ANIONGAP 10 03/30/2018      Lab Results  Component Value Date   ALT 19 07/29/2022   AST 21 07/29/2022   ALKPHOS 82 07/29/2022   BILITOT 0.4 07/29/2022       Latest Ref Rng & Units 07/29/2022   10:02 AM 06/02/2022   10:32 AM 04/08/2022    1:21 PM  CBC  WBC 3.4 - 10.8 x10E3/uL 7.8  7.9  7.4   Hemoglobin 11.1 - 15.9 g/dL 29.5  62.1  30.8   Hematocrit 34.0 - 46.6 % 38.5  39.3  39.7   Platelets 150 - 450 x10E3/uL 376  374  379        Latest Ref Rng & Units 07/29/2022   10:02 AM 06/02/2022   10:32 AM 04/08/2022    1:21 PM  Hepatic Function  Total Protein 6.0 - 8.5 g/dL 6.6  CANCELED  6.6   Albumin 3.9 - 4.9 g/dL 4.3  CANCELED  4.6   AST 0 - 40 IU/L 21  CANCELED  19   ALT 0 - 32 IU/L 19  CANCELED  19   Alk Phosphatase 44 - 121 IU/L 82  CANCELED  70   Total Bilirubin 0.0 - 1.2 mg/dL 0.4  CANCELED  0.3    External labs:   Cholesterol, total 190.000 m 02/02/2022 HDL 70.000 mg  02/02/2022 LDL 102.000 m 02/02/2022 Triglycerides 103.000 m  02/02/2022  A1C 6.000 mg/ 02/02/2022   Cholesterol, total 261.000 m 01/30/2021 HDL 64.000 mg 01/30/2021 LDL 176.000 m 01/30/2021 Triglycerides 120.000 m 01/30/2021  A1C 5.700 % 01/30/2021  Radiology:    Cardiac Studies:  PCV ECHOCARDIOGRAM COMPLETE 07/09/2021 Left ventricle cavity is normal in size and wall thickness. Normal global wall motion. Normal LV systolic function with EF 60%. Normal diastolic filling pattern. Structurally normal trileaflet aortic valve.  Mild (Grade I) aortic regurgitation. Mild to moderate mitral regurgitation. Moderate tricuspid regurgitation. No evidence of pulmonary hypertension.   PCV MYOCARDIAL PERFUSION WO LEXISCAN 07/29/2021 Normal ECG stress. The patient exercised for 4 minutes and 17 seconds of a Bruce protocol, achieving approximately 6.17 METs. & 91% of MPHR. No chest pain. Fatigue. Reduced exercise tolerance. Occasional PVC at peak exercise. Myocardial perfusion is normal. Overall LV systolic function is normal without regional wall motion abnormalities. Stress LV EF: 69%. No previous exam available for comparison. Low risk.  EKG:   EKG 08/04/2022: Normal sinus rhythm at rate of 65 bpm, normal EKG.  No significant change from 07/02/2021.   Medications and allergies   Allergies  Allergen Reactions   Fish Allergy Anaphylaxis   Iodine Anaphylaxis    " PER PT IV CONTRAST"   Boniva [Ibandronic Acid]     vomiting    Fosamax [Alendronate]     vomiting    Paxlovid [Nirmatrelvir-Ritonavir]    Sulfonamide Derivatives Other (See Comments)    Tongue turns black.     Medication list   Current Outpatient Medications:    Abatacept (ORENCIA IV), Inject 500 mg into the vein every 30 (thirty) days. Infuse 2 vials / 500 mg over 30 minutes every 4 weeks, Disp: , Rfl:    acetaminophen (TYLENOL) 325 MG tablet, Take 650 mg by mouth daily as needed (One hour prior to infusion). , Disp: , Rfl:    ADVAIR HFA 45-21 MCG/ACT inhaler, SMARTSIG:2 Puff(s) By  Mouth Twice Daily, Disp: , Rfl:    albuterol (VENTOLIN HFA) 108 (90 Base) MCG/ACT inhaler, Inhale 2 puffs into the lungs every 6 (six) hours as needed for wheezing or shortness of breath., Disp: , Rfl:    amoxicillin (AMOXIL) 500 MG tablet, Before dental procedures, Disp: , Rfl:    aspirin EC 81 MG tablet, Take 1 tablet (81 mg total) by mouth daily., Disp: 90 tablet, Rfl: 3   calcium carbonate ([employer] CALCIUM) 600 MG tablet, Take 600 mg by mouth 2 (two) times daily with a meal., Disp: , Rfl:    chlorpheniramine (CHLOR-TRIMETON) 4 MG tablet, Take 4 mg by mouth daily as needed for allergies., Disp: , Rfl:    diphenhydrAMINE (BENADRYL) 25 mg capsule, Take 25 mg by mouth daily as needed (One hour prior to infusion). , Disp: , Rfl:    famotidine (PEPCID) 20 MG tablet, One after  bfast and supper, Disp: 60 tablet, Rfl: 11   ibuprofen (ADVIL,MOTRIN) 200 MG tablet, Take 400 mg by mouth every 6 (six) hours as needed (for inflammation)., Disp: , Rfl:    Liniments (SALONPAS EX), Apply topically as needed., Disp: , Rfl:    Magnesium 250 MG TABS, Take 500-1,000 tablets by mouth daily. , Disp: , Rfl:    montelukast (SINGULAIR) 10 MG tablet, Take 10 mg by mouth daily., Disp: , Rfl:    NATURAL VITAMIN D-3 125 MCG (5000 UT) TABS, TAKE 1 TABLET BY MOUTH EVERY DAY, Disp: 200 tablet, Rfl: 1   NONFORMULARY OR COMPOUNDED  ITEM, Estradiol 0.02% vaginal cream.  Place 1/2 gram vaginally two to three times weekly at bedtime. Disp:  3 month supply (Patient taking differently: Place 0.5 Applicatorfuls vaginally once a week. Estradiol 0.02% vaginal cream.  Place 1/2 gram vaginally two to three times weekly at bedtime. Disp:  3 month supply), Disp: 1 each, Rfl: 3   promethazine (PHENERGAN) 25 MG tablet, Take 25 mg by mouth every 6 (six) hours as needed for nausea or vomiting., Disp: , Rfl:    Sodium Fluoride 1.1 % PSTE, Place 1 application onto teeth 2 (two) times daily. , Disp: , Rfl:    temazepam (RESTORIL) 30 MG capsule, Take 30  mg by mouth at bedtime. , Disp: , Rfl:    rosuvastatin (CRESTOR) 5 MG tablet, Take 1 tablet (5 mg total) by mouth daily., Disp: , Rfl:   Assessment     ICD-10-CM   1. Hypercholesteremia  E78.00 rosuvastatin (CRESTOR) 5 MG tablet    2. Aortic atherosclerosis (HCC)  I70.0 EKG 12-Lead    rosuvastatin (CRESTOR) 5 MG tablet    3. Dyspnea on exertion  R06.09     4. Palpitations  R00.2        Orders Placed This Encounter  Procedures   EKG 12-Lead    No orders of the defined types were placed in this encounter.   Medications Discontinued During This Encounter  Medication Reason   amphetamine-dextroamphetamine (ADDERALL) 5 MG tablet Completed Course   Flaxseed, Linseed, (FLAX SEED OIL PO) Patient Preference   RESTASIS 0.05 % ophthalmic emulsion Completed Course   rosuvastatin (CRESTOR) 5 MG tablet Reorder     Recommendations:   Kristy Perez is a 72 y.o.  Caucasian female patient with seropositive rheumatoid arthritis,  GERD and chronic dyspnea on exertion due to reactive airway disease and COPD, with >40-pack-year history of smoking quit since 2001, hypercholesterolemia and history of left breast cancer status post lumpectomy followed by chemo and radiation therapy in 2003.  1. Hypercholesteremia Reviewed external labs, significant improvement in lipids overall with just 5 mg of rosuvastatin every other day.  However in view of her age and also aortic atherosclerosis, will increase Crestor to 5 mg daily.  She is tolerating this well. - rosuvastatin (CRESTOR) 5 MG tablet; Take 1 tablet (5 mg total) by mouth daily.  2. Aortic atherosclerosis (HCC) Presently doing well and tolerating aspirin and rosuvastatin.  No change in physical exam. - EKG 12-Lead - rosuvastatin (CRESTOR) 5 MG tablet; Take 1 tablet (5 mg total) by mouth daily.  3. Dyspnea on exertion Dyspnea on exertion related to COPD has remained stable.  She is still having some difficulty getting her inhalers due to  cost.  4. Palpitations Palpitations symptoms are very stable and occasional, suggest PACs and PVCs.  EKG is normal, no change in physical exam, vascular examination is normal.  Although she has noted to have very trace to mild aortic regurgitation, no further evaluation is indicated with regard to this.  Unless a new murmur is heard or if she develops worsening dyspnea, leg edema or chest pain, I will see her back on a as needed basis.  Will request Dr. Jacqulyn Bath to Rx Crestor 5 mg daily.  Other orders - montelukast (SINGULAIR) 10 MG tablet; Take 10 mg by mouth daily.     Yates Decamp, MD, [facility] 08/04/2022, 9:36 AM Office: [phone]

## 2022-08-26 DIAGNOSIS — M0519 Rheumatoid lung disease with rheumatoid arthritis of multiple sites: Secondary | ICD-10-CM | POA: Diagnosis not present

## 2022-09-08 DIAGNOSIS — B338 Other specified viral diseases: Secondary | ICD-10-CM

## 2022-09-08 HISTORY — DX: Other specified viral diseases: B33.8

## 2022-09-23 NOTE — Progress Notes (Signed)
Office Visit Note  [name]             Date of Birth: [birth year]           MRN: [id number]             PCP: Ollen Bowl, MD Referring: Ollen Bowl, MD Visit Date: 10/06/2022 Occupation: @GUAROCC @  Subjective:  Medication management  History of Present Illness: Kristy Perez is a 72 y.o. female with seropositive rheumatoid arthritis, osteoarthritis, degenerative disc disease and osteoporosis.  She denies any joint pain or joint swelling.  Continues to get Orencia infusions in [facility] 500 mg IV every 28 days without any interruption.  She states recently she has been experiencing some shortness of breath.  She states that shortness of breath has been chronic for which she has been seeing Dr. Elesa Massed.  She has an appointment coming up with Dr. Elesa Massed in October.  She also was evaluated by Dr. Jacinto Halim for shortness of breath and cardiology workup was negative except for occasional PVCs    Activities of Daily Living:  Patient reports morning stiffness for 0 minutes.   Patient Denies nocturnal pain.  Difficulty dressing/grooming: Denies Difficulty climbing stairs: Denies Difficulty getting out of chair: Denies Difficulty using hands for taps, buttons, cutlery, and/or writing: Reports  Review of Systems  Constitutional:  Positive for fatigue.  HENT:  Positive for mouth dryness. Negative for mouth sores.   Eyes:  Positive for photophobia and dryness.  Respiratory:  Positive for shortness of breath. Negative for cough and wheezing.   Cardiovascular:  Positive for irregular heartbeat. Negative for chest pain and palpitations.  Gastrointestinal:  Negative for blood in stool, constipation and diarrhea.  Endocrine: Negative for increased urination.  Genitourinary:  Negative for involuntary urination.  Musculoskeletal:  Negative for joint pain, gait problem, joint pain, joint swelling, myalgias, muscle weakness, morning stiffness, muscle tenderness and myalgias.  Skin:  Positive  for sensitivity to sunlight. Negative for color change, rash and hair loss.  Allergic/Immunologic: Negative for susceptible to infections.  Neurological:  Negative for dizziness and headaches.  Hematological:  Negative for swollen glands.  Psychiatric/Behavioral:  Negative for depressed mood and sleep disturbance. The patient is not nervous/anxious.     PMFS History:  Patient Active Problem List   Diagnosis Date Noted   Numbness 03/31/2020   Mononeuropathy multiplex syndrome 03/31/2020   Neuritis of right ulnar nerve 03/31/2020   Polyneuropathy 03/31/2020   Hoarseness with classic voice fatigue 10/10/2018   S/P lumbar laminectomy 04/06/2018   Radiculopathy due to lumbar intervertebral disc disorder 12/07/2017   Congenital spondylolysis 12/07/2017   Spondylolisthesis of lumbar region 12/07/2017   DDD (degenerative disc disease), lumbar 12/08/2016   High risk medication use 03/15/2016   DJD (degenerative joint disease), cervical 03/15/2016   History of asthma 03/15/2016   History of squamous cell carcinoma 03/15/2016   History of basal cell carcinoma 03/15/2016   History of breast cancer 03/15/2016   History of Chiari malformation 03/15/2016   Vitamin D deficiency 03/15/2016   Rheumatoid arthritis with rheumatoid factor of multiple sites without organ or systems involvement (HCC) 10/20/2015   Esophageal reflux 05/01/2015   Anxiety 05/01/2015   Sjogrens syndrome (HCC) 05/01/2015   Chronic obstructive airway disease with asthma 05/01/2015   Deviated nasal septum 05/24/2014    Class: Chronic   Insomnia 11/25/2007   DOE (dyspnea on exertion) 11/15/2007    Past Medical History:  Diagnosis Date   Anemia  Asthma    inhaler one x per day   Breast cancer (HCC)    Cancer (HCC)    breast, left   Chiari malformation    Difficult intubation    cannot hyperextend neck due to Chiari malformation with tonsillar involvement, RA   Dysrhythmia 2015   history of PAC's   GERD  (gastroesophageal reflux disease)    High cholesterol    dx by PCP per patient    Personal history of radiation therapy    PONV (postoperative nausea and vomiting)    Rheumatoid arthritis (HCC)    on Humira   RSV (respiratory syncytial virus infection) 09/08/2022    Family History  Problem Relation Age of Onset   Diabetes Mother    Heart failure Mother    Bladder Cancer Mother    Stroke Mother    Cancer Father        head and neck   Uterine cancer Maternal Aunt    Bladder Cancer Maternal Grandmother    Colon cancer Maternal Grandmother    Breast cancer Cousin    Past Surgical History:  Procedure Laterality Date   BREAST SURGERY Left 2003   Lumpectomy T1C1, NO and negative ER/ PR   COLONOSCOPY  10/04   COLONOSCOPY  11/10   normal recheck in 5 years   COLPORRHAPHY     ESOPHAGOGASTRODUODENOSCOPY ENDOSCOPY  11/10   GERD   FOOT SURGERY Left age 52   removal of pseudo rheumatoid nodules from left forefoot.   LAPAROSCOPIC BILATERAL SALPINGO OOPHERECTOMY N/A 07/30/2013   Procedure: LAPAROSCOPIC BILATERAL SALPINGO OOPHORECTOMY WITH WASHINGS;  Surgeon: Annamaria Boots, MD;  Location: [facility];  Service: Gynecology;  Laterality: N/A;   LUMBAR LAMINECTOMY/DECOMPRESSION MICRODISCECTOMY Right 04/06/2018   Procedure: Extraforaminal Microdiscectomy - Lumbar Two-Lumbar Three - right;  Surgeon: Tia Alert, MD;  Location: [facility] OR;  Service: Neurosurgery;  Laterality: Right;  Extraforaminal Microdiscectomy - Lumbar Two-Lumbar Three - right   NASAL SEPTOPLASTY W/ TURBINOPLASTY Bilateral 05/24/2014   Procedure: NASAL SEPTOPLASTY WITH  BILATERAL TURBINATE REDUCTION;  Surgeon: Osborn Coho, MD;  Location: [facility] OR;  Service: ENT;  Laterality: Bilateral;   PELVIC FLOOR REPAIR  3/04   SPARC with AP colporrhaphy   PILONIDAL CYST EXCISION     TOTAL VAGINAL HYSTERECTOMY  1989   with AP repair   Social History   Social History Narrative   Lives alone in a 2 story home. Has 2 children.  Works as a  Catering manager.  Education: college.    Immunization History  Administered Date(s) Administered   Fluad Quad(high Dose 65+) 10/14/2018   Influenza, High Dose Seasonal PF 10/26/2017   Influenza,inj,Quad PF,6+ Mos 10/18/2016   Influenza-Unspecified 10/08/2014, 10/24/2015   Janssen (J&J) SARS-COV-2 Vaccination 05/01/2019   PFIZER(Purple Top)SARS-COV-2 Vaccination 02/15/2020, 09/03/2020   Pneumococcal-Unspecified 10/18/2007   Tdap 11/24/2008, 05/04/2021   Zoster Recombinant(Shingrix) 12/25/2016     Objective: Vital Signs: BP 112/72 (BP Location: Right Arm, Patient Position: Sitting, Cuff Size: Normal)   Pulse 79   Resp 16   Ht 5' 1.5" (1.562 m)   Wt 131 lb 3.2 oz (59.5 kg)   LMP 02/15/1985 (Approximate)   BMI 24.39 kg/m    Physical Exam Vitals and nursing note reviewed.  Constitutional:      Appearance: She is well-developed.  HENT:     Head: Normocephalic and atraumatic.  Eyes:     Conjunctiva/sclera: Conjunctivae normal.  Cardiovascular:     Rate and Rhythm: Normal rate and regular rhythm.  Heart sounds: Normal heart sounds.  Pulmonary:     Effort: Pulmonary effort is normal.     Breath sounds: Normal breath sounds.  Abdominal:     General: Bowel sounds are normal.     Palpations: Abdomen is soft.  Musculoskeletal:     Cervical back: Normal range of motion.  Lymphadenopathy:     Cervical: No cervical adenopathy.  Skin:    General: Skin is warm and dry.     Capillary Refill: Capillary refill takes less than 2 seconds.  Neurological:     Mental Status: She is alert and oriented to person, place, and time.  Psychiatric:        Behavior: Behavior normal.      Musculoskeletal Exam: She had limited range of motion of the cervical spine on lateral rotation.  She had good range of motion of her thoracic and lumbar spine without any point tenderness.  Shoulders, elbows, wrist joints, MCPs PIPs and DIPs with good range of motion.  She had MCP thickening with no synovitis.   Ulnar deviation was noted bilaterally.  CMC, PIP and DIP thickening consistent with osteoarthritis was noted.  Hip joints and knee joints were in good range of motion.  There was no tenderness over ankles or MTPs.  CDAI Exam: CDAI Score: -- Patient Global: 10 / 100; Provider Global: 10 / 100 Swollen: --; Tender: -- Joint Exam 10/06/2022   No joint exam has been documented for this visit   There is currently no information documented on the homunculus. Go to the Rheumatology activity and complete the homunculus joint exam.  Investigation: No additional findings.  Imaging: No results found.  Recent Labs: Lab Results  Component Value Date   WBC 8.5 10/01/2022   HGB 13.0 10/01/2022   PLT 384 10/01/2022   NA 139 10/01/2022   K 4.2 10/01/2022   CL 101 10/01/2022   CO2 23 10/01/2022   GLUCOSE 150 (H) 10/01/2022   BUN 14 10/01/2022   CREATININE 0.51 (L) 10/01/2022   BILITOT 0.3 10/01/2022   ALKPHOS 74 10/01/2022   AST 16 10/01/2022   ALT 19 10/01/2022   PROT 6.3 10/01/2022   ALBUMIN 4.4 10/01/2022   CALCIUM 9.3 10/01/2022   GFRAA 114 03/31/2020   QFTBGOLD Negative 06/15/2016   QFTBGOLDPLUS NEGATIVE 04/22/2022    Speciality Comments: Orencia IV 500mg  every 4 weeks, patient infuses at [facility] Tb gold negative 06/15/16  Procedures:  No procedures performed Allergies: Fish allergy, Iodine, Boniva [ibandronic acid], Fosamax [alendronate], Paxlovid [nirmatrelvir-ritonavir], and Sulfonamide derivatives   Assessment / Plan:     Visit Diagnoses: Rheumatoid arthritis with rheumatoid factor of multiple sites without organ or systems involvement (HCC) - +RF, +CCP: Patient had no synovitis on the examination today.  Patient states her symptoms are controlled on Orencia infusions.  She had no interruption in the Orencia infusions.  High risk medication use - Orencia IV infusion 500 mg every 28 days at [facility].  (inadequate response to Enbrel and Humira).  Labs obtained on October 01, 2022 were  reviewed.  CBC and CMP were stable.  TB Gold was negative on April 22, 2022.  Information on immunization was placed in the AVS.  She was advised to hold Orencia if she develops an infection resume after the infection resolves.  Trochanteric bursitis of right hip-she has intermittent discomfort.  IT band stretches were discussed.  DDD (degenerative disc disease), cervical -she continues to have limited lateral rotation.  MRI C-spine 04/03/2020  DDD (degenerative disc disease),  lumbar -she has chronic lower back pain.  She had no point tenderness on the examination today.  S/p lumbar laminectomy on 04/06/18 by Dr. Yetta Barre  Age-related osteoporosis without current pathological fracture - DEXA 02/12/2019: Right femoral neck BMD 0.552 with T score -2.7DEXA updated on 08/25/21: The BMD RFN is 0.708 g/cm2 with a T-score of -2.4.  She is on calcium and vitamin D.  She is trying to exercise on a regular basis.  Will recheck DEXA scan in 2015.  Vitamin D deficiency-she continues to take vitamin D supplement.  Primary insomnia-good sleep hygiene was discussed.  History of asthma-she is followed by Dr. Sherene Sires.  Shortness of breath -patient states she has been experiencing shortness of breath.  She was evaluated by Dr. Jacinto Halim.  He thinks she has PVCs.  Her lungs were clear to auscultation.  Increased risk of ILD with rheumatoid arthritis was discussed.  I will check chest x-ray today.  She also has underlying asthma.  She has an appointment coming up with Dr. Sherene Sires.  Plan: DG Chest 2 View  History of Chiari malformation  Gastroesophageal reflux disease without esophagitis  History of squamous cell carcinoma  History of basal cell carcinoma  History of breast cancer  Orders: Orders Placed This Encounter  Procedures   DG Chest 2 View   No orders of the defined types were placed in this encounter.    Follow-Up Instructions: Return in about 5 months (around 03/08/2023) for Rheumatoid arthritis.   Pollyann Savoy, MD  Note - This record has been created using Animal nutritionist.  Chart creation errors have been sought, but may not always  have been located. Such creation errors do not reflect on  the standard of medical care.

## 2022-10-01 ENCOUNTER — Other Ambulatory Visit: Payer: Self-pay

## 2022-10-01 DIAGNOSIS — Z79899 Other long term (current) drug therapy: Secondary | ICD-10-CM

## 2022-10-01 DIAGNOSIS — M0579 Rheumatoid arthritis with rheumatoid factor of multiple sites without organ or systems involvement: Secondary | ICD-10-CM

## 2022-10-01 DIAGNOSIS — M0519 Rheumatoid lung disease with rheumatoid arthritis of multiple sites: Secondary | ICD-10-CM | POA: Diagnosis not present

## 2022-10-02 LAB — COMPREHENSIVE METABOLIC PANEL
ALT: 19 IU/L (ref 0–32)
AST: 16 IU/L (ref 0–40)
Albumin: 4.4 g/dL (ref 3.9–4.9)
Alkaline Phosphatase: 74 IU/L (ref 44–121)
BUN/Creatinine Ratio: 27 (ref 12–28)
BUN: 14 mg/dL (ref 8–27)
Bilirubin Total: 0.3 mg/dL (ref 0.0–1.2)
CO2: 23 mmol/L (ref 20–29)
Calcium: 9.3 mg/dL (ref 8.7–10.3)
Chloride: 101 mmol/L (ref 96–106)
Creatinine, Ser: 0.51 mg/dL — ABNORMAL LOW (ref 0.57–1.00)
Globulin, Total: 1.9 g/dL (ref 1.5–4.5)
Glucose: 150 mg/dL — ABNORMAL HIGH (ref 70–99)
Potassium: 4.2 mmol/L (ref 3.5–5.2)
Sodium: 139 mmol/L (ref 134–144)
Total Protein: 6.3 g/dL (ref 6.0–8.5)
eGFR: 100 mL/min/{1.73_m2} (ref >59)

## 2022-10-02 LAB — CBC
Hematocrit: 39.9 % (ref 34.0–46.6)
Hemoglobin: 13 g/dL (ref 11.1–15.9)
MCH: 28.8 pg (ref 26.6–33.0)
MCHC: 32.6 g/dL (ref 31.5–35.7)
MCV: 88 fL (ref 79–97)
Platelets: 384 10*3/uL (ref 150–450)
RBC: 4.52 x10E6/uL (ref 3.77–5.28)
RDW: 13 % (ref 11.7–15.4)
WBC: 8.5 10*3/uL (ref 3.4–10.8)

## 2022-10-05 ENCOUNTER — Telehealth: Payer: Self-pay

## 2022-10-05 NOTE — Telephone Encounter (Signed)
-----   Message from Asa Lente sent at 10/02/2022  9:52 AM EDT ----- Please let her know that the The labwork was ok   --- the sugar was a little high but probablynot fasting

## 2022-10-05 NOTE — Telephone Encounter (Signed)
Called patient and informed her of lab results. She asked if copy can be mailed to her home address.

## 2022-10-05 NOTE — Telephone Encounter (Signed)
 I called patient to discuss. No answer, left a message asking her to call us back. Please route to POD 1.

## 2022-10-05 NOTE — Telephone Encounter (Signed)
At 12:54 pt left a vm returning the call to RN re: what she believed was for blood work results.

## 2022-10-06 ENCOUNTER — Ambulatory Visit: Payer: Medicare Other | Admitting: Rheumatology

## 2022-10-06 ENCOUNTER — Encounter: Payer: Self-pay | Admitting: Rheumatology

## 2022-10-06 ENCOUNTER — Ambulatory Visit (HOSPITAL_COMMUNITY)
Admission: RE | Admit: 2022-10-06 | Discharge: 2022-10-06 | Disposition: A | Discharge status: Home / Self Care | Payer: Medicare Other | Source: Ambulatory Visit | Attending: Rheumatology | Admitting: Rheumatology

## 2022-10-06 VITALS — BP 112/72 | HR 79 | Resp 16 | Ht 61.5 in | Wt 131.2 lb

## 2022-10-06 DIAGNOSIS — E559 Vitamin D deficiency, unspecified: Secondary | ICD-10-CM

## 2022-10-06 DIAGNOSIS — K219 Gastro-esophageal reflux disease without esophagitis: Secondary | ICD-10-CM

## 2022-10-06 DIAGNOSIS — Z85828 Personal history of other malignant neoplasm of skin: Secondary | ICD-10-CM | POA: Insufficient documentation

## 2022-10-06 DIAGNOSIS — Z8589 Personal history of malignant neoplasm of other organs and systems: Secondary | ICD-10-CM

## 2022-10-06 DIAGNOSIS — R0602 Shortness of breath: Secondary | ICD-10-CM | POA: Insufficient documentation

## 2022-10-06 DIAGNOSIS — M810 Age-related osteoporosis without current pathological fracture: Secondary | ICD-10-CM | POA: Insufficient documentation

## 2022-10-06 DIAGNOSIS — Z8669 Personal history of other diseases of the nervous system and sense organs: Secondary | ICD-10-CM | POA: Insufficient documentation

## 2022-10-06 DIAGNOSIS — M5136 Other intervertebral disc degeneration, lumbar region: Secondary | ICD-10-CM

## 2022-10-06 DIAGNOSIS — Z853 Personal history of malignant neoplasm of breast: Secondary | ICD-10-CM | POA: Insufficient documentation

## 2022-10-06 DIAGNOSIS — Z79899 Other long term (current) drug therapy: Secondary | ICD-10-CM | POA: Insufficient documentation

## 2022-10-06 DIAGNOSIS — I700 Atherosclerosis of aorta: Secondary | ICD-10-CM | POA: Diagnosis not present

## 2022-10-06 DIAGNOSIS — M0579 Rheumatoid arthritis with rheumatoid factor of multiple sites without organ or systems involvement: Secondary | ICD-10-CM | POA: Insufficient documentation

## 2022-10-06 DIAGNOSIS — Z8709 Personal history of other diseases of the respiratory system: Secondary | ICD-10-CM

## 2022-10-06 DIAGNOSIS — M5030 Other cervical disc degeneration, unspecified cervical region: Secondary | ICD-10-CM | POA: Insufficient documentation

## 2022-10-06 DIAGNOSIS — F5101 Primary insomnia: Secondary | ICD-10-CM

## 2022-10-06 DIAGNOSIS — M069 Rheumatoid arthritis, unspecified: Secondary | ICD-10-CM | POA: Diagnosis not present

## 2022-10-06 DIAGNOSIS — M7061 Trochanteric bursitis, right hip: Secondary | ICD-10-CM

## 2022-10-06 DIAGNOSIS — M51369 Other intervertebral disc degeneration, lumbar region without mention of lumbar back pain or lower extremity pain: Secondary | ICD-10-CM

## 2022-10-06 NOTE — Patient Instructions (Signed)
Vaccines You are taking a medication(s) that can suppress your immune system.  The following immunizations are recommended: Flu annually Covid-19  RSV Td/Tdap (tetanus, diphtheria, pertussis) every 10 years Pneumonia (Prevnar 15 then Pneumovax 23 at least 1 year apart.  Alternatively, can take Prevnar 20 without needing additional dose) Shingrix: 2 doses from 4 weeks to 6 months apart  Please check with your PCP to make sure you are up to date.   If you have signs or symptoms of an infection or start antibiotics: First, call your PCP for workup of your infection. Hold your medication through the infection, until you complete your antibiotics, and until symptoms resolve if you take the following: Injectable medication (Actemra, Benlysta, Cimzia, Cosentyx, Enbrel, Humira, Kevzara, Orencia, Remicade, Simponi, Stelara, Taltz, Tremfya) Methotrexate Leflunomide (Arava) Mycophenolate (Cellcept) Morrie Sheldon, Olumiant, or Rinvoq

## 2022-10-13 DIAGNOSIS — H16223 Keratoconjunctivitis sicca, not specified as Sjogren's, bilateral: Secondary | ICD-10-CM | POA: Diagnosis not present

## 2022-10-13 DIAGNOSIS — H5203 Hypermetropia, bilateral: Secondary | ICD-10-CM | POA: Diagnosis not present

## 2022-10-13 DIAGNOSIS — H2513 Age-related nuclear cataract, bilateral: Secondary | ICD-10-CM | POA: Diagnosis not present

## 2022-10-13 DIAGNOSIS — D3132 Benign neoplasm of left choroid: Secondary | ICD-10-CM | POA: Diagnosis not present

## 2022-10-14 NOTE — Progress Notes (Signed)
No active lung disease was noted.  Calcification of aorta was noted.  Please forward results to her PCP and Dr. Sherene Sires.

## 2022-11-02 ENCOUNTER — Encounter: Payer: Self-pay | Admitting: Physician Assistant

## 2022-11-02 DIAGNOSIS — M0519 Rheumatoid lung disease with rheumatoid arthritis of multiple sites: Secondary | ICD-10-CM | POA: Diagnosis not present

## 2022-11-04 DIAGNOSIS — H35363 Drusen (degenerative) of macula, bilateral: Secondary | ICD-10-CM | POA: Diagnosis not present

## 2022-11-04 DIAGNOSIS — H43813 Vitreous degeneration, bilateral: Secondary | ICD-10-CM | POA: Diagnosis not present

## 2022-11-04 DIAGNOSIS — D3132 Benign neoplasm of left choroid: Secondary | ICD-10-CM | POA: Diagnosis not present

## 2022-11-17 NOTE — Progress Notes (Unsigned)
Kristy Perez, female    DOB: 1950/10/12,     MRN: [id number]   Brief patient profile:  72yowf quit smoking 2001 with hx of asthma as child outgrew age 72  onset feet aching mid 1990s eventually dx as RA and some AB on saba while smoking 2001 and  And AB apparently resolved completely off cigs for a few years but then started needing more saba so rx   dulera 100 just one puff daily   Which eliminated need for saba   and did fine until around winter 2019 sob pc every daily either lunch=supper not related to quantity or quality of food and  not better p saba so referred to pulmonary clinic 10/10/2018 by Dr   Kristy Perez.  Was on ppi until around 2015 and pepcid and daily has sensation of hb worse since stopped ppi as "worried about the cost and reported side effects.   History of Present Illness  10/10/2018  Pulmonary/ 1st office eval/Kristy Perez  Chief Complaint  Patient presents with   Pulmonary Consult    Referred by Dr. Corliss Perez for eval of SOB. [name].   Dyspnea:  MMRC1 = can walk nl pace, flat grade, can't hurry or go uphills or steps s sob   Cough: no Sleep: fine p temazepam/ one pillow on side bed flat no resp c/os SABA use: none now rec Plan A = Automatic = Start symbicort 80 Take 2 puffs first thing in am and then another 2 puffs about 12 hours later.  Work on maintaining optimal  inhaler technique: Plan B = Backup Only use your albuterol inhaler as a rescue medication  Change pepcid 20 mg to take after bfast and after supper  for 4  weeks       11/28/2018  f/u ov/Kristy Perez re: RA, hoarseness, unexplained sob improved on symb Chief Complaint  Patient presents with   Follow-up    Breathing is overall doing well. [name].   Dyspnea:  Able to walk up 4 miles flat ok,  One flight and sob "panting" x 3 years no change on symb 80 = MMRC1 = can walk nl pace, flat grade, can't hurry or go uphills  or steps s sob   Better with meals now  Cough: hoarseness p symb Sleeping: fine after temazepam SABA use: one daily s pattern occurs at rest not noct  02: none  Rec See Kristy Perez for your hoarseness and possible of CA (cricoaretenoiditis) > neg eval  Try symbicort 80 dosed just one twice daily (immediate on awakening and 12 hours later)  to see if ex tolerance changes or your need for albuterol goes up and if so resume Take 2 puffs first thing in am and then another 2 puffs about 12 hours later.  Only use your albuterol as a rescue medication to  Please schedule a follow up office visit in 6 weeks, call sooner if needed with PFTs on return > not done     11/18/2022  f/u ov/Kristy Perez re: RA/ hoarseness.sob  maint on advair 45  2bid  sometimes pepcid  Chief Complaint  Patient presents with   Consult    SOB x 5 years  Dyspnea:  MMRC1 = can walk nl pace, flat grade, can't hurry or go uphills or steps s sob   Cough: when gets hot / perfume also > very hoarse with coughing fits and  urge to clear throat  Sleeping: flat bed one pillow s  resp cc  SABA use: none  02: none     No obvious day to day or daytime variability or assoc excess/ purulent sputum or mucus plugs or hemoptysis or cp or chest tightness, subjective wheeze or overt sinus or hb symptoms.    Also denies any obvious fluctuation of symptoms with weather or environmental changes or other aggravating or alleviating factors except as outlined above   No unusual exposure hx or h/o childhood pna  knowledge of premature birth.  Current Allergies, Complete Past Medical History, Past Surgical History, Family History, and Social History were reviewed in [facility] record.  ROS  The following are not active complaints unless bolded Hoarseness, sore throat, dysphagia, dental problems, itching, sneezing,  nasal congestion or discharge of excess mucus or purulent secretions, ear ache,   fever, chills, sweats, unintended  wt loss or wt gain, classically pleuritic or exertional cp,  orthopnea pnd or arm/hand swelling  or leg swelling, presyncope, palpitations, abdominal pain, anorexia, nausea, vomiting, diarrhea  or change in bowel habits or change in bladder habits, change in stools or change in urine, dysuria, hematuria,  rash, arthralgias improved , visual complaints, headache, numbness, weakness or ataxia or problems with walking or coordination,  change in mood or  memory.        Current Meds  Medication Sig   Abatacept (ORENCIA IV) Inject 500 mg into the vein every 30 (thirty) days. Infuse 2 vials / 500 mg over 30 minutes every 4 weeks   acetaminophen (TYLENOL) 325 MG tablet Take 650 mg by mouth daily as needed (One hour prior to infusion).    ADVAIR HFA 45-21 MCG/ACT inhaler SMARTSIG:2 Puff(s) By Mouth Twice Daily   albuterol (VENTOLIN HFA) 108 (90 Base) MCG/ACT inhaler Inhale 2 puffs into the lungs every 6 (six) hours as needed for wheezing or shortness of breath.   amoxicillin (AMOXIL) 500 MG tablet Before dental procedures   aspirin EC 81 MG tablet Take 1 tablet (81 mg total) by mouth daily.   calcium carbonate ([employer] CALCIUM) 600 MG tablet Take 600 mg by mouth 2 (two) times daily with a meal.   chlorpheniramine (CHLOR-TRIMETON) 4 MG tablet Take 4 mg by mouth daily as needed for allergies.   diphenhydrAMINE (BENADRYL) 25 mg capsule Take 25 mg by mouth daily as needed (One hour prior to infusion).    famotidine (PEPCID) 20 MG tablet One after  bfast and supper   ibuprofen (ADVIL,MOTRIN) 200 MG tablet Take 400 mg by mouth every 6 (six) hours as needed (for inflammation).   Liniments (SALONPAS EX) Apply topically as needed.   Magnesium 250 MG TABS Take 500-1,000 tablets by mouth daily.    montelukast (SINGULAIR) 10 MG tablet Take 10 mg by mouth daily.   NATURAL VITAMIN D-3 125 MCG (5000 UT) TABS TAKE 1 TABLET BY MOUTH EVERY DAY   NONFORMULARY OR COMPOUNDED ITEM Estradiol 0.02% vaginal cream.  Place 1/2 gram  vaginally two to three times weekly at bedtime. Disp:  3 month supply (Patient taking differently: Place 0.5 Applicatorfuls vaginally once a week. Estradiol 0.02% vaginal cream.  Place 1/2 gram vaginally two to three times weekly at bedtime. Disp:  3 month supply)   promethazine (PHENERGAN) 25 MG tablet Take 25 mg by mouth every 6 (six) hours as needed for nausea or vomiting.   rosuvastatin (CRESTOR) 5 MG tablet Take 1 tablet (5 mg total) by mouth daily.  Sodium Fluoride 1.1 % PSTE Place 1 application onto teeth 2 (two) times daily.    temazepam (RESTORIL) 30 MG capsule Take 30 mg by mouth at bedtime.               Past Medical History:  Diagnosis Date   Anemia    Asthma    inhaler one x per day   Cancer [facility])    breast, left   Chiari malformation    Difficult intubation    cannot hyperextend neck due to Chiari malformation with tonsillar involvement, RA   Dysrhythmia 2015   history of PAC's   GERD (gastroesophageal reflux disease)    PONV (postoperative nausea and vomiting)    Rheumatoid arthritis (HCC)    on Humira        Objective:     wts  11/18/2022       129   11/28/18 129 lb 3.2 oz (58.6 kg)  10/10/18 129 lb (58.5 kg)  08/22/18 128 lb 9.6 oz (58.3 kg)    Vital signs reviewed  11/18/2022  - Note at rest 02 sats  95% on RA   General appearance:    amb pleasant minimally wf nad  / occ throat clearing    HEENT : Oropharynx  clear s pnds      Nasal turbinates nl / mild pseudowheeze    NECK :  without  apparent JVD/ palpable Nodes/TM    LUNGS: no acc muscle use,  Nl contour chest which is clear to A and P bilaterally without cough on insp or exp maneuvers   CV:  RRR  no s3 or murmur or increase in P2, and no edema   ABD:  soft and nontender    MS:  Nl gait/ ext warm without deformities Or obvious joint restrictions  calf tenderness, cyanosis or clubbing    SKIN: warm and dry without lesions    NEURO:  alert, approp, nl sensorium with  no motor or  cerebellar deficits apparent.     I personally reviewed images and agree with radiology impression as follows:  CXR:   10/06/22 pa and lateral Ok - no ILD     Assessment

## 2022-11-18 ENCOUNTER — Encounter: Payer: Self-pay | Admitting: Internal Medicine

## 2022-11-18 ENCOUNTER — Ambulatory Visit: Payer: Medicare Other | Admitting: Internal Medicine

## 2022-11-18 VITALS — BP 112/74 | HR 65 | Temp 97.3°F | Ht 61.5 in | Wt 129.8 lb

## 2022-11-18 DIAGNOSIS — R490 Dysphonia: Secondary | ICD-10-CM | POA: Diagnosis not present

## 2022-11-18 DIAGNOSIS — R0609 Other forms of dyspnea: Secondary | ICD-10-CM

## 2022-11-18 NOTE — Patient Instructions (Addendum)
Make sure you check your oxygen saturation at your highest level of activity(NOT after you stop)  to be sure it stays over 90% and keep track of it at least once a week, more often if breathing getting worse, and let me know if losing ground. (Collect the dots to connect the dots approach)     Try advair 45  2 puffs each am with the 3rd 4th puff option if doing great and not needing any rescue   GERD (REFLUX)  is an extremely common cause of respiratory symptoms just like yours , many times with no obvious heartburn at all.    It can be treated with medication, but also with lifestyle changes including elevation of the head of your bed (ideally with 6 -8inch blocks under the headboard of your bed),  Smoking cessation, avoidance of late meals, excessive alcohol, and avoid fatty foods, chocolate, peppermint, colas, red wine, and acidic juices such as orange juice.  NO MINT OR MENTHOL PRODUCTS SO NO COUGH DROPS  USE SUGARLESS CANDY INSTEAD (Jolley ranchers or Stover's or Life Savers) or even ice chips will also do - the key is to swallow to prevent all throat clearing. NO OIL BASED VITAMINS - use powdered substitutes.  Avoid fish oil when coughing.   My office will be contacting you by phone for referral for PFTs   - if you don't hear back from my office within one week please call us back or notify us thru MyChart and we'll address it right away.   Please schedule a follow up visit in 12 months but call sooner if needed

## 2022-11-18 NOTE — Assessment & Plan Note (Signed)
Onset winter 2019 typically pc but also MMRC=1 assoc with voice fatgue - 10/10/2018  try symb 80 2bid as dulera 100 not on her plan  - 11/28/2018   [facility] RA  2 laps @  approx 219ft each @ moderate pace  stopped due to  End of study, no sob with sats 94%  - 11/28/2018  reduce sym 80 to 1 bid to see if any change hoarsness, sob, saba need  - 11/18/2022  After extensive coaching inhaler device,  effectiveness =    90% so advair 45 reduce to 2 each am with pm doses prn flares or any requirement for saba  - 11/18/2022   Walked on RA  x  3  lap(s) =  approx 750  ft  @ brisk pace, stopped due to end of study, talking continuously s sob  with lowest 02 sats 95%   -  PFTs 11/18/2022 >>>   Really not much airways dz clinically and more impressed by upper airway noises which may actually be aggravated by use of advair so rec above taper and f/u yearly with serial sats:  Advised: Make sure you check your oxygen saturation at your highest level of activity(NOT after you stop)  to be sure it stays over 90% and keep track of it at least once a week, more often if breathing getting worse, and let me know if losing ground. (Collect the dots to connect the dots approach)

## 2022-11-18 NOTE — Assessment & Plan Note (Signed)
Onset summer 2019  - 10/10/2018 rx max rx for gerd > no better 11/28/2018 so rec [facility] eval  > neg for active  CA  - 11/18/2022 continues with mild pseudowheeze so rec gerd diet/pepcid otc 1st and if getting worse needs ppi and another ENT eval   In meantime rec diet/ pepcid as above  Each maintenance medication was reviewed in detail including emphasizing most importantly the difference between maintenance and prns and under what circumstances the prns are to be triggered using an action plan format where appropriate.  Total time for H and P, chart review, counseling, reviewing hfa device(s) , directly observing portions of ambulatory 02 saturation study/ and generating customized AVS unique to this office visit / same day charting = 30 min with pt not seen in 4 years

## 2022-11-25 ENCOUNTER — Other Ambulatory Visit (HOSPITAL_COMMUNITY): Payer: Self-pay | Admitting: Family Medicine

## 2022-11-25 DIAGNOSIS — E7800 Pure hypercholesterolemia, unspecified: Secondary | ICD-10-CM

## 2022-11-29 ENCOUNTER — Ambulatory Visit (HOSPITAL_COMMUNITY)
Admission: RE | Admit: 2022-11-29 | Discharge: 2022-11-29 | Disposition: A | Discharge status: Home / Self Care | Payer: 59 | Source: Ambulatory Visit | Attending: Family Medicine | Admitting: Family Medicine

## 2022-11-29 DIAGNOSIS — E7800 Pure hypercholesterolemia, unspecified: Secondary | ICD-10-CM | POA: Insufficient documentation

## 2022-11-30 ENCOUNTER — Other Ambulatory Visit: Payer: Self-pay

## 2022-11-30 DIAGNOSIS — M0579 Rheumatoid arthritis with rheumatoid factor of multiple sites without organ or systems involvement: Secondary | ICD-10-CM

## 2022-11-30 DIAGNOSIS — M0519 Rheumatoid lung disease with rheumatoid arthritis of multiple sites: Secondary | ICD-10-CM | POA: Diagnosis not present

## 2022-12-01 LAB — CBC WITH DIFFERENTIAL/PLATELET
Basophils Absolute: 0 10*3/uL (ref 0.0–0.2)
Basos: 1 %
EOS (ABSOLUTE): 0.6 10*3/uL — ABNORMAL HIGH (ref 0.0–0.4)
Eos: 7 %
Hematocrit: 39.7 % (ref 34.0–46.6)
Hemoglobin: 13 g/dL (ref 11.1–15.9)
Immature Grans (Abs): 0 10*3/uL (ref 0.0–0.1)
Immature Granulocytes: 0 %
Lymphocytes Absolute: 2.3 10*3/uL (ref 0.7–3.1)
Lymphs: 27 %
MCH: 28.7 pg (ref 26.6–33.0)
MCHC: 32.7 g/dL (ref 31.5–35.7)
MCV: 88 fL (ref 79–97)
Monocytes Absolute: 0.7 10*3/uL (ref 0.1–0.9)
Monocytes: 8 %
Neutrophils Absolute: 4.8 10*3/uL (ref 1.4–7.0)
Neutrophils: 57 %
Platelets: 387 10*3/uL (ref 150–450)
RBC: 4.53 x10E6/uL (ref 3.77–5.28)
RDW: 12.9 % (ref 11.7–15.4)
WBC: 8.4 10*3/uL (ref 3.4–10.8)

## 2022-12-01 LAB — COMPREHENSIVE METABOLIC PANEL
ALT: 16 [IU]/L (ref 0–32)
AST: 19 [IU]/L (ref 0–40)
Albumin: 4.5 g/dL (ref 3.8–4.8)
Alkaline Phosphatase: 82 [IU]/L (ref 44–121)
BUN/Creatinine Ratio: 23 (ref 12–28)
BUN: 13 mg/dL (ref 8–27)
Bilirubin Total: 0.4 mg/dL (ref 0.0–1.2)
CO2: 22 mmol/L (ref 20–29)
Calcium: 9.5 mg/dL (ref 8.7–10.3)
Chloride: 101 mmol/L (ref 96–106)
Creatinine, Ser: 0.56 mg/dL — ABNORMAL LOW (ref 0.57–1.00)
Globulin, Total: 2.1 g/dL (ref 1.5–4.5)
Glucose: 86 mg/dL (ref 70–99)
Potassium: 4.3 mmol/L (ref 3.5–5.2)
Sodium: 139 mmol/L (ref 134–144)
Total Protein: 6.6 g/dL (ref 6.0–8.5)
eGFR: 98 mL/min/{1.73_m2} (ref >59)

## 2022-12-07 DIAGNOSIS — L820 Inflamed seborrheic keratosis: Secondary | ICD-10-CM | POA: Diagnosis not present

## 2022-12-07 DIAGNOSIS — D225 Melanocytic nevi of trunk: Secondary | ICD-10-CM | POA: Diagnosis not present

## 2022-12-07 DIAGNOSIS — D0439 Carcinoma in situ of skin of other parts of face: Secondary | ICD-10-CM | POA: Diagnosis not present

## 2022-12-07 DIAGNOSIS — L821 Other seborrheic keratosis: Secondary | ICD-10-CM | POA: Diagnosis not present

## 2022-12-07 DIAGNOSIS — D485 Neoplasm of uncertain behavior of skin: Secondary | ICD-10-CM | POA: Diagnosis not present

## 2022-12-07 DIAGNOSIS — L570 Actinic keratosis: Secondary | ICD-10-CM | POA: Diagnosis not present

## 2022-12-07 DIAGNOSIS — Z85828 Personal history of other malignant neoplasm of skin: Secondary | ICD-10-CM | POA: Diagnosis not present

## 2022-12-07 DIAGNOSIS — L578 Other skin changes due to chronic exposure to nonionizing radiation: Secondary | ICD-10-CM | POA: Diagnosis not present

## 2022-12-07 DIAGNOSIS — L814 Other melanin hyperpigmentation: Secondary | ICD-10-CM | POA: Diagnosis not present

## 2022-12-17 DIAGNOSIS — Z23 Encounter for immunization: Secondary | ICD-10-CM | POA: Diagnosis not present

## 2023-01-06 DIAGNOSIS — M0519 Rheumatoid lung disease with rheumatoid arthritis of multiple sites: Secondary | ICD-10-CM | POA: Diagnosis not present

## 2023-01-11 DIAGNOSIS — E7800 Pure hypercholesterolemia, unspecified: Secondary | ICD-10-CM | POA: Diagnosis not present

## 2023-01-24 ENCOUNTER — Encounter: Payer: Self-pay | Admitting: Internal Medicine

## 2023-02-03 DIAGNOSIS — M0519 Rheumatoid lung disease with rheumatoid arthritis of multiple sites: Secondary | ICD-10-CM | POA: Diagnosis not present

## 2023-02-04 ENCOUNTER — Other Ambulatory Visit: Payer: Self-pay | Admitting: Internal Medicine

## 2023-02-04 DIAGNOSIS — I361 Nonrheumatic tricuspid (valve) insufficiency: Secondary | ICD-10-CM | POA: Diagnosis not present

## 2023-02-04 DIAGNOSIS — M069 Rheumatoid arthritis, unspecified: Secondary | ICD-10-CM | POA: Diagnosis not present

## 2023-02-04 DIAGNOSIS — N6310 Unspecified lump in the right breast, unspecified quadrant: Secondary | ICD-10-CM | POA: Diagnosis not present

## 2023-02-04 DIAGNOSIS — Z0000 Encounter for general adult medical examination without abnormal findings: Secondary | ICD-10-CM | POA: Diagnosis not present

## 2023-02-04 DIAGNOSIS — R7303 Prediabetes: Secondary | ICD-10-CM | POA: Diagnosis not present

## 2023-02-04 DIAGNOSIS — Z79899 Other long term (current) drug therapy: Secondary | ICD-10-CM | POA: Diagnosis not present

## 2023-02-04 DIAGNOSIS — F988 Other specified behavioral and emotional disorders with onset usually occurring in childhood and adolescence: Secondary | ICD-10-CM | POA: Diagnosis not present

## 2023-02-04 DIAGNOSIS — I700 Atherosclerosis of aorta: Secondary | ICD-10-CM | POA: Diagnosis not present

## 2023-02-04 DIAGNOSIS — I340 Nonrheumatic mitral (valve) insufficiency: Secondary | ICD-10-CM | POA: Diagnosis not present

## 2023-02-04 DIAGNOSIS — E7800 Pure hypercholesterolemia, unspecified: Secondary | ICD-10-CM | POA: Diagnosis not present

## 2023-02-04 DIAGNOSIS — G935 Compression of brain: Secondary | ICD-10-CM | POA: Diagnosis not present

## 2023-02-04 DIAGNOSIS — J45909 Unspecified asthma, uncomplicated: Secondary | ICD-10-CM | POA: Diagnosis not present

## 2023-02-10 DIAGNOSIS — R7303 Prediabetes: Secondary | ICD-10-CM | POA: Diagnosis not present

## 2023-02-18 DIAGNOSIS — Z853 Personal history of malignant neoplasm of breast: Secondary | ICD-10-CM | POA: Diagnosis not present

## 2023-02-18 DIAGNOSIS — N6315 Unspecified lump in the right breast, overlapping quadrants: Secondary | ICD-10-CM | POA: Diagnosis not present

## 2023-02-28 NOTE — Progress Notes (Deleted)
 Office Visit Note  [name]             Date of Birth: Apr 06, 1950           MRN: [id number]             PCP: Ollen Bowl, MD Referring: Ollen Bowl, MD Visit Date: 03/10/2023 Occupation: @GUAROCC @  Subjective:  No chief complaint on file.   History of Present Illness: Kristy Perez is a 73 y.o. female ***     Activities of Daily Living:  Patient reports morning stiffness for *** {minute/hour:19697}.   Patient {ACTIONS;DENIES/REPORTS:21021675::"Denies"} nocturnal pain.  Difficulty dressing/grooming: {ACTIONS;DENIES/REPORTS:21021675::"Denies"} Difficulty climbing stairs: {ACTIONS;DENIES/REPORTS:21021675::"Denies"} Difficulty getting out of chair: {ACTIONS;DENIES/REPORTS:21021675::"Denies"} Difficulty using hands for taps, buttons, cutlery, and/or writing: {ACTIONS;DENIES/REPORTS:21021675::"Denies"}  No Rheumatology ROS completed.   PMFS History:  Patient Active Problem List   Diagnosis Date Noted   Numbness 03/31/2020   Mononeuropathy multiplex syndrome 03/31/2020   Neuritis of right ulnar nerve 03/31/2020   Polyneuropathy 03/31/2020   Hoarseness with classic voice fatigue 10/10/2018   S/P lumbar laminectomy 04/06/2018   Radiculopathy due to lumbar intervertebral disc disorder 12/07/2017   Congenital spondylolysis 12/07/2017   Spondylolisthesis of lumbar region 12/07/2017   DDD (degenerative disc disease), lumbar 12/08/2016   High risk medication use 03/15/2016   DJD (degenerative joint disease), cervical 03/15/2016   History of asthma 03/15/2016   History of squamous cell carcinoma 03/15/2016   History of basal cell carcinoma 03/15/2016   History of breast cancer 03/15/2016   History of Chiari malformation 03/15/2016   Vitamin D deficiency 03/15/2016   Rheumatoid arthritis with rheumatoid factor of multiple sites without organ or systems involvement (HCC) 10/20/2015   Esophageal reflux 05/01/2015   Anxiety 05/01/2015   Sjogrens syndrome  (HCC) 05/01/2015   Chronic obstructive airway disease with asthma (HCC) 05/01/2015   Deviated nasal septum 05/24/2014    Class: Chronic   Insomnia 11/25/2007   DOE (dyspnea on exertion) 11/15/2007    Past Medical History:  Diagnosis Date   Anemia    Asthma    inhaler one x per day   Breast cancer (HCC)    Cancer (HCC)    breast, left   Chiari malformation    Difficult intubation    cannot hyperextend neck due to Chiari malformation with tonsillar involvement, RA   Dysrhythmia 2015   history of PAC's   GERD (gastroesophageal reflux disease)    High cholesterol    dx by PCP per patient    Personal history of radiation therapy    PONV (postoperative nausea and vomiting)    Rheumatoid arthritis (HCC)    on Humira   RSV (respiratory syncytial virus infection) 09/08/2022    Family History  Problem Relation Age of Onset   Diabetes Mother    Heart failure Mother    Bladder Cancer Mother    Stroke Mother    Cancer Father        head and neck   Uterine cancer Maternal Aunt    Bladder Cancer Maternal Grandmother    Colon cancer Maternal Grandmother    Breast cancer Cousin    Past Surgical History:  Procedure Laterality Date   BREAST SURGERY Left 2003   Lumpectomy T1C1, NO and negative ER/ PR   COLONOSCOPY  10/04   COLONOSCOPY  11/10   normal recheck in 5 years   COLPORRHAPHY     ESOPHAGOGASTRODUODENOSCOPY ENDOSCOPY  11/10   GERD   FOOT SURGERY Left age  20   removal of pseudo rheumatoid nodules from left forefoot.   LAPAROSCOPIC BILATERAL SALPINGO OOPHERECTOMY N/A 07/30/2013   Procedure: LAPAROSCOPIC BILATERAL SALPINGO OOPHORECTOMY WITH WASHINGS;  Surgeon: Annamaria Boots, MD;  Location: [facility];  Service: Gynecology;  Laterality: N/A;   LUMBAR LAMINECTOMY/DECOMPRESSION MICRODISCECTOMY Right 04/06/2018   Procedure: Extraforaminal Microdiscectomy - Lumbar Two-Lumbar Three - right;  Surgeon: Tia Alert, MD;  Location[facility];  Service: Neurosurgery;  Laterality: Right;   Extraforaminal Microdiscectomy - Lumbar Two-Lumbar Three - right   NASAL SEPTOPLASTY W/ TURBINOPLASTY Bilateral 05/24/2014   Procedure: NASAL SEPTOPLASTY WITH  BILATERAL TURBINATE REDUCTION;  Surgeon: Osborn Coho, MD;  Location: [facility] OR;  Service: ENT;  Laterality: Bilateral;   PELVIC FLOOR REPAIR  3/04   SPARC with AP colporrhaphy   PILONIDAL CYST EXCISION     TOTAL VAGINAL HYSTERECTOMY  1989   with AP repair   Social History   Social History Narrative   Lives alone in a 2 story home. Has 2 children.  Works as a Catering manager.  Education: college.    Immunization History  Administered Date(s) Administered   Fluad Quad(high Dose 65+) 10/14/2018   Influenza, High Dose Seasonal PF 10/26/2017   Influenza,inj,Quad PF,6+ Mos 10/18/2016   Influenza-Unspecified 10/08/2014, 10/24/2015   Janssen (J&J) SARS-COV-2 Vaccination 05/01/2019   PFIZER(Purple Top)SARS-COV-2 Vaccination 02/15/2020, 09/03/2020   Pneumococcal-Unspecified 10/18/2007   Tdap 11/24/2008, 05/04/2021   Zoster Recombinant(Shingrix) 12/25/2016     Objective: Vital Signs: LMP 02/15/1985 (Approximate)    Physical Exam   Musculoskeletal Exam: ***  CDAI Exam: CDAI Score: -- Patient Global: --; Provider Global: -- Swollen: --; Tender: -- Joint Exam 03/10/2023   No joint exam has been documented for this visit   There is currently no information documented on the homunculus. Go to the Rheumatology activity and complete the homunculus joint exam.  Investigation: No additional findings.  Imaging: No results found.  Recent Labs: Lab Results  Component Value Date   WBC 8.4 11/30/2022   HGB 13.0 11/30/2022   PLT 387 11/30/2022   NA 139 11/30/2022   K 4.3 11/30/2022   CL 101 11/30/2022   CO2 22 11/30/2022   GLUCOSE 86 11/30/2022   BUN 13 11/30/2022   CREATININE 0.56 (L) 11/30/2022   BILITOT 0.4 11/30/2022   ALKPHOS 82 11/30/2022   AST 19 11/30/2022   ALT 16 11/30/2022   PROT 6.6 11/30/2022   ALBUMIN 4.5  11/30/2022   CALCIUM 9.5 11/30/2022   GFRAA 114 03/31/2020   QFTBGOLD Negative 06/15/2016   QFTBGOLDPLUS NEGATIVE 04/22/2022    Speciality Comments: Orencia IV 500mg  every 4 weeks, patient infuses at [facility] Tb gold negative 06/15/16  Procedures:  No procedures performed Allergies: Fish allergy, Iodine, Boniva [ibandronic acid], Fosamax [alendronate], Paxlovid [nirmatrelvir-ritonavir], and Sulfonamide derivatives   Assessment / Plan:     Visit Diagnoses: Rheumatoid arthritis with rheumatoid factor of multiple sites without organ or systems involvement (HCC)  High risk medication use  Trochanteric bursitis of right hip  DDD (degenerative disc disease), cervical  Degeneration of intervertebral disc of lumbar region without discogenic back pain or lower extremity pain  Age-related osteoporosis without current pathological fracture  Vitamin D deficiency  Primary insomnia  History of asthma  History of Chiari malformation  Gastroesophageal reflux disease without esophagitis  History of squamous cell carcinoma  History of basal cell carcinoma  History of breast cancer  Orders: No orders of the defined types were placed in this encounter.  No orders of the defined  types were placed in this encounter.   Face-to-face time spent with patient was *** minutes. Greater than 50% of time was spent in counseling and coordination of care.  Follow-Up Instructions: No follow-ups on file.   Gearldine Bienenstock, PA-C  Note - This record has been created using Dragon software.  Chart creation errors have been sought, but may not always  have been located. Such creation errors do not reflect on  the standard of medical care.

## 2023-03-03 DIAGNOSIS — L570 Actinic keratosis: Secondary | ICD-10-CM | POA: Diagnosis not present

## 2023-03-03 DIAGNOSIS — L578 Other skin changes due to chronic exposure to nonionizing radiation: Secondary | ICD-10-CM | POA: Diagnosis not present

## 2023-03-03 DIAGNOSIS — D0439 Carcinoma in situ of skin of other parts of face: Secondary | ICD-10-CM | POA: Diagnosis not present

## 2023-03-10 ENCOUNTER — Ambulatory Visit: Payer: 59 | Admitting: Physician Assistant

## 2023-03-10 DIAGNOSIS — F5101 Primary insomnia: Secondary | ICD-10-CM

## 2023-03-10 DIAGNOSIS — Z8589 Personal history of malignant neoplasm of other organs and systems: Secondary | ICD-10-CM

## 2023-03-10 DIAGNOSIS — Z85828 Personal history of other malignant neoplasm of skin: Secondary | ICD-10-CM

## 2023-03-10 DIAGNOSIS — K219 Gastro-esophageal reflux disease without esophagitis: Secondary | ICD-10-CM

## 2023-03-10 DIAGNOSIS — E559 Vitamin D deficiency, unspecified: Secondary | ICD-10-CM

## 2023-03-10 DIAGNOSIS — Z79899 Other long term (current) drug therapy: Secondary | ICD-10-CM

## 2023-03-10 DIAGNOSIS — M0579 Rheumatoid arthritis with rheumatoid factor of multiple sites without organ or systems involvement: Secondary | ICD-10-CM

## 2023-03-10 DIAGNOSIS — M5030 Other cervical disc degeneration, unspecified cervical region: Secondary | ICD-10-CM

## 2023-03-10 DIAGNOSIS — Z853 Personal history of malignant neoplasm of breast: Secondary | ICD-10-CM

## 2023-03-10 DIAGNOSIS — M810 Age-related osteoporosis without current pathological fracture: Secondary | ICD-10-CM

## 2023-03-10 DIAGNOSIS — Z8669 Personal history of other diseases of the nervous system and sense organs: Secondary | ICD-10-CM

## 2023-03-10 DIAGNOSIS — Z8709 Personal history of other diseases of the respiratory system: Secondary | ICD-10-CM

## 2023-03-10 DIAGNOSIS — M51369 Other intervertebral disc degeneration, lumbar region without mention of lumbar back pain or lower extremity pain: Secondary | ICD-10-CM

## 2023-03-10 DIAGNOSIS — M7061 Trochanteric bursitis, right hip: Secondary | ICD-10-CM

## 2023-03-10 NOTE — Progress Notes (Signed)
 Office Visit Note  [name]             Date of Birth: 1950/02/18           MRN: [id number]             PCP: Vernon Velna SAUNDERS, MD Referring: Vernon Velna SAUNDERS, MD Visit Date: 03/24/2023 Occupation: @GUAROCC @  Subjective:  Medication monitoring    History of Present Illness: Kristy Perez is a 73 y.o. female with history of seropositive rheumatoid arthritis, DDD, and osteoporosis. She remains on Orencia  IV infusion 500 mg every 28 days at [facility].  Patient denies any interruptions in therapy.  She denies any signs or symptoms of a rheumatoid arthritis flare but has been experiencing intermittent arthralgias and joint stiffness in her hands and feet due to underlying osteoarthritis.  Patient states that her right ring finger has been locking up and tender intermittently.  She has also had some intermittent soreness in both elbows. Patient continues to have updated lab work with every other infusion.   She is scheduled to have updated PFTs tomorrow.  Activities of Daily Living:  Patient reports morning stiffness for 0 minutes.   Patient Reports nocturnal pain.  Difficulty dressing/grooming: Denies Difficulty climbing stairs: Denies Difficulty getting out of chair: Denies Difficulty using hands for taps, buttons, cutlery, and/or writing: Reports  Review of Systems  Constitutional:  Positive for fatigue.  HENT:  Positive for mouth dryness. Negative for mouth sores.   Eyes:  Positive for dryness. Negative for pain.  Respiratory:  Positive for shortness of breath. Negative for cough and wheezing.   Cardiovascular:  Negative for chest pain and palpitations.  Gastrointestinal:  Negative for blood in stool, constipation and diarrhea.  Endocrine: Negative for increased urination.  Genitourinary:  Positive for involuntary urination.  Musculoskeletal:  Positive for joint pain and joint pain. Negative for gait problem, joint swelling, myalgias, muscle weakness, morning stiffness,  muscle tenderness and myalgias.  Skin:  Positive for sensitivity to sunlight. Negative for color change, rash and hair loss.  Allergic/Immunologic: Negative for susceptible to infections.  Neurological:  Negative for dizziness and headaches.  Hematological:  Negative for swollen glands.  Psychiatric/Behavioral:  Negative for depressed mood and sleep disturbance. The patient is not nervous/anxious.     PMFS History:  Patient Active Problem List   Diagnosis Date Noted   Numbness 03/31/2020   Mononeuropathy multiplex syndrome 03/31/2020   Neuritis of right ulnar nerve 03/31/2020   Polyneuropathy 03/31/2020   Hoarseness with classic voice fatigue 10/10/2018   S/P lumbar laminectomy 04/06/2018   Radiculopathy due to lumbar intervertebral disc disorder 12/07/2017   Congenital spondylolysis 12/07/2017   Spondylolisthesis of lumbar region 12/07/2017   DDD (degenerative disc disease), lumbar 12/08/2016   High risk medication use 03/15/2016   DJD (degenerative joint disease), cervical 03/15/2016   History of asthma 03/15/2016   History of squamous cell carcinoma 03/15/2016   History of basal cell carcinoma 03/15/2016   History of breast cancer 03/15/2016   History of Chiari malformation 03/15/2016   Vitamin D  deficiency 03/15/2016   Rheumatoid arthritis with rheumatoid factor of multiple sites without organ or systems involvement (HCC) 10/20/2015   Esophageal reflux 05/01/2015   Anxiety 05/01/2015   Sjogrens syndrome (HCC) 05/01/2015   Chronic obstructive airway disease with asthma (HCC) 05/01/2015   Deviated nasal septum 05/24/2014    Class: Chronic   Insomnia 11/25/2007   DOE (dyspnea on exertion) 11/15/2007    Past Medical History:  Diagnosis Date   Anemia    Asthma    inhaler one x per day   Breast cancer (HCC)    Cancer (HCC)    breast, left   Chiari malformation    Difficult intubation    cannot hyperextend neck due to Chiari malformation with tonsillar involvement, RA    Dysrhythmia 2015   history of PAC's   GERD (gastroesophageal reflux disease)    High cholesterol    dx by PCP per patient    Personal history of radiation therapy    PONV (postoperative nausea and vomiting)    Rheumatoid arthritis (HCC)    on Humira   RSV (respiratory syncytial virus infection) 09/08/2022    Family History  Problem Relation Age of Onset   Diabetes Mother    Heart failure Mother    Bladder Cancer Mother    Stroke Mother    Cancer Father        head and neck   Uterine cancer Maternal Aunt    Bladder Cancer Maternal Grandmother    Colon cancer Maternal Grandmother    Breast cancer Cousin    Past Surgical History:  Procedure Laterality Date   BREAST SURGERY Left 2003   Lumpectomy T1C1, NO and negative ER/ PR   COLONOSCOPY  10/04   COLONOSCOPY  11/10   normal recheck in 5 years   COLPORRHAPHY     ESOPHAGOGASTRODUODENOSCOPY ENDOSCOPY  11/10   GERD   FOOT SURGERY Left age 83   removal of pseudo rheumatoid nodules from left forefoot.   LAPAROSCOPIC BILATERAL SALPINGO OOPHERECTOMY N/A 07/30/2013   Procedure: LAPAROSCOPIC BILATERAL SALPINGO OOPHORECTOMY WITH WASHINGS;  Surgeon: Ronal Elvie Pinal, MD;  Location: [facility];  Service: Gynecology;  Laterality: N/A;   LUMBAR LAMINECTOMY/DECOMPRESSION MICRODISCECTOMY Right 04/06/2018   Procedure: Extraforaminal Microdiscectomy - Lumbar Two-Lumbar Three - right;  Surgeon: Joshua Alm RAMAN, MD;  Location: [facility] OR;  Service: Neurosurgery;  Laterality: Right;  Extraforaminal Microdiscectomy - Lumbar Two-Lumbar Three - right   NASAL SEPTOPLASTY W/ TURBINOPLASTY Bilateral 05/24/2014   Procedure: NASAL SEPTOPLASTY WITH  BILATERAL TURBINATE REDUCTION;  Surgeon: Alm Bouche, MD;  Location: [facility] OR;  Service: ENT;  Laterality: Bilateral;   PELVIC FLOOR REPAIR  3/04   SPARC with AP colporrhaphy   PILONIDAL CYST EXCISION     TOTAL VAGINAL HYSTERECTOMY  1989   with AP repair   Social History   Social History Narrative   Lives alone in  a 2 story home. Has 2 children.  Works as a catering manager.  Education: college.    Immunization History  Administered Date(s) Administered   Fluad Quad(high Dose 65+) 10/14/2018   Influenza, High Dose Seasonal PF 10/26/2017   Influenza,inj,Quad PF,6+ Mos 10/18/2016   Influenza-Unspecified 10/08/2014, 10/24/2015   Janssen (J&J) SARS-COV-2 Vaccination 05/01/2019   PFIZER(Purple Top)SARS-COV-2 Vaccination 02/15/2020, 09/03/2020   Pneumococcal-Unspecified 10/18/2007   Tdap 11/24/2008, 05/04/2021   Zoster Recombinant(Shingrix) 12/25/2016     Objective: Vital Signs: BP 112/72 (BP Location: Left Arm, Patient Position: Sitting, Cuff Size: Normal)   Pulse 78   Resp 16   Ht 5' 1.5 (1.562 m)   Wt 127 lb 9.6 oz (57.9 kg)   LMP 02/15/1985 (Approximate)   BMI 23.72 kg/m    Physical Exam Vitals and nursing note reviewed.  Constitutional:      Appearance: She is well-developed.  HENT:     Head: Normocephalic and atraumatic.  Eyes:     Conjunctiva/sclera: Conjunctivae normal.  Cardiovascular:     Rate and  Rhythm: Normal rate and regular rhythm.     Heart sounds: Normal heart sounds.  Pulmonary:     Effort: Pulmonary effort is normal.     Breath sounds: Normal breath sounds.  Abdominal:     General: Bowel sounds are normal.     Palpations: Abdomen is soft.  Musculoskeletal:     Cervical back: Normal range of motion.  Lymphadenopathy:     Cervical: No cervical adenopathy.  Skin:    General: Skin is warm and dry.     Capillary Refill: Capillary refill takes less than 2 seconds.  Neurological:     Mental Status: She is alert and oriented to person, place, and time.  Psychiatric:        Behavior: Behavior normal.      Musculoskeletal Exam: C-spine limited ROM  with lateral rotation.  Shoulder joints, elbow joints, wrist joints have good range of motion.  Ulnar deviation noted.  Thickening of MCP joints but no active synovitis.  CMC, PIP, DIP thickening consistent with osteoarthritis  noted.  Hip joints have good range of motion.  Knee joints have good range of motion no warmth or effusion.  Ankle joints have good range of motion with no tenderness or joint swelling.  PIP and DIP thickening consistent with osteoarthritis of both feet.  CDAI Exam: CDAI Score: -- Patient Global: --; Provider Global: -- Swollen: --; Tender: -- Joint Exam 03/24/2023   No joint exam has been documented for this visit   There is currently no information documented on the homunculus. Go to the Rheumatology activity and complete the homunculus joint exam.  Investigation: No additional findings.  Imaging: No results found.  Recent Labs: Lab Results  Component Value Date   WBC 8.4 11/30/2022   HGB 13.0 11/30/2022   PLT 387 11/30/2022   NA 139 11/30/2022   K 4.3 11/30/2022   CL 101 11/30/2022   CO2 22 11/30/2022   GLUCOSE 86 11/30/2022   BUN 13 11/30/2022   CREATININE 0.56 (L) 11/30/2022   BILITOT 0.4 11/30/2022   ALKPHOS 82 11/30/2022   AST 19 11/30/2022   ALT 16 11/30/2022   PROT 6.6 11/30/2022   ALBUMIN 4.5 11/30/2022   CALCIUM  9.5 11/30/2022   GFRAA 114 03/31/2020   QFTBGOLD Negative 06/15/2016   QFTBGOLDPLUS NEGATIVE 04/22/2022    Speciality Comments: Orencia  IV 500mg  every 4 weeks, patient infuses at [facility] Tb gold negative 06/15/16  Procedures:  No procedures performed Allergies: Fish allergy, Iodine, Boniva [ibandronate], Fosamax [alendronate], Paxlovid [nirmatrelvir-ritonavir], and Sulfonamide derivatives   Assessment / Plan:     Visit Diagnoses: Rheumatoid arthritis with rheumatoid factor of multiple sites without organ or systems involvement (HCC) - +RF, +CCP: She has no synovitis on examination today.  She has not had any signs or symptoms of a rheumatoid arthritis flare recently.  She experiences intermittent arthralgias and joint stiffness due to underlying osteoarthritis involving her hands and feet but has no active inflammation at this time.  Patient remains on  IV Orencia  infusions every 28 days at [facility].  She has not had any recent gaps in therapy.  Patient has been getting updated lab work with every other infusion but we do not have these records to review.  Plan to call [facility] to obtain most recent lab work to review.  Plan to also try switching the patient from [facility] to a current facility for infusions as requested. Patient will remain on Orencia  as monotherapy.  She was advised to notify us  if she  develops signs or symptoms of a flare.  She will follow-up in the office in 5 months or sooner if needed.  High risk medication use - Orencia  IV infusion 500 mg every 28 days at [facility].   (inadequate response to Enbrel and Humira).  CBC and CMP updated on 11/30/22.  She has been getting lab work with every other infusion.  Plan to call [facility] to obtain most recent lab work.  TB gold negative on 04/22/22. Future order for TB gold placed today.  According to the patient in the past she has been told by [facility] that they cannot order her TB Gold.  Plan on trying to switch her infusions to a [facility] facility to have access to her lab work with infusions going forward. CXR 10/06/22: No acute disease. Negative for plain film evidence of interstitial lung disease. - Plan: QuantiFERON-TB Gold Plus  Screening for tuberculosis - Future order for TB gold placed today - Plan: QuantiFERON-TB Gold Plus  Trochanteric bursitis of right hip: Intermittent discomfort.   DDD (degenerative disc disease), cervical: MRI C-spine 04/03/2020.  C-spine has limited ROM with lateral rotation.    Degeneration of intervertebral disc of lumbar region without discogenic back pain or lower extremity pain: S/p lumbar laminectomy on 04/06/18 by Dr. Josefa continues to experience intermittent discomfort in her lower back.  She has no symptoms of radiculopathy at this time.  She uses Salonpas patches as needed for pain relief as well as a heating pad as needed.  Lateral epicondylitis of both elbows - Patient has  tenderness of the lateral epicondyle of both elbows, right greater than left.  Discussed that first-line treatment is outpatient PT.  Patient would like to try home exercises so a handout of exercises was provided to the patient.  She will notify us  if her symptoms persist or worsen.  Shortness of breath - Under the care of Dr. Darlean.  Chest x-ray 10/06/2022-no active cardiopulmonary disease.  Patient will be updating PFTs tomorrow.  Age-related osteoporosis without current pathological fracture - DEXA 02/12/2019: Right femoral neck BMD 0.552 with T score -2.7.  DEXA updated on 08/25/21: The BMD RFN is 0.708 g/cm2 with a T-score of -2.4. She is taking a calcium  and vitamin D  supplement daily.  She is walking daily for exercise.  DEXA will be due in July 2025  Vitamin D  deficiency  Other medical conditions are listed as follows:  Primary insomnia  History of asthma  History of Chiari malformation  Gastroesophageal reflux disease without esophagitis  History of squamous cell carcinoma  History of basal cell carcinoma  History of breast cancer  Orders: Orders Placed This Encounter  Procedures   QuantiFERON-TB Gold Plus   No orders of the defined types were placed in this encounter.    Follow-Up Instructions: Return in about 5 months (around 08/21/2023) for Rheumatoid arthritis, DDD, Osteoporosis.   Waddell CHRISTELLA Craze, PA-C  Note - This record has been created using Dragon software.  Chart creation errors have been sought, but may not always  have been located. Such creation errors do not reflect on  the standard of medical care.

## 2023-03-15 DIAGNOSIS — M0519 Rheumatoid lung disease with rheumatoid arthritis of multiple sites: Secondary | ICD-10-CM | POA: Diagnosis not present

## 2023-03-24 ENCOUNTER — Encounter: Payer: Self-pay | Admitting: Physician Assistant

## 2023-03-24 ENCOUNTER — Ambulatory Visit: Payer: Medicare Other | Attending: Physician Assistant | Admitting: Physician Assistant

## 2023-03-24 VITALS — BP 112/72 | HR 78 | Resp 16 | Ht 61.5 in | Wt 127.6 lb

## 2023-03-24 DIAGNOSIS — E559 Vitamin D deficiency, unspecified: Secondary | ICD-10-CM | POA: Diagnosis not present

## 2023-03-24 DIAGNOSIS — M5030 Other cervical disc degeneration, unspecified cervical region: Secondary | ICD-10-CM | POA: Insufficient documentation

## 2023-03-24 DIAGNOSIS — Z8709 Personal history of other diseases of the respiratory system: Secondary | ICD-10-CM | POA: Insufficient documentation

## 2023-03-24 DIAGNOSIS — M810 Age-related osteoporosis without current pathological fracture: Secondary | ICD-10-CM | POA: Insufficient documentation

## 2023-03-24 DIAGNOSIS — Z111 Encounter for screening for respiratory tuberculosis: Secondary | ICD-10-CM | POA: Diagnosis not present

## 2023-03-24 DIAGNOSIS — Z8669 Personal history of other diseases of the nervous system and sense organs: Secondary | ICD-10-CM | POA: Diagnosis not present

## 2023-03-24 DIAGNOSIS — M7061 Trochanteric bursitis, right hip: Secondary | ICD-10-CM | POA: Insufficient documentation

## 2023-03-24 DIAGNOSIS — M7711 Lateral epicondylitis, right elbow: Secondary | ICD-10-CM | POA: Diagnosis not present

## 2023-03-24 DIAGNOSIS — M7712 Lateral epicondylitis, left elbow: Secondary | ICD-10-CM | POA: Diagnosis not present

## 2023-03-24 DIAGNOSIS — Z853 Personal history of malignant neoplasm of breast: Secondary | ICD-10-CM | POA: Diagnosis not present

## 2023-03-24 DIAGNOSIS — Z8589 Personal history of malignant neoplasm of other organs and systems: Secondary | ICD-10-CM | POA: Diagnosis not present

## 2023-03-24 DIAGNOSIS — R0602 Shortness of breath: Secondary | ICD-10-CM | POA: Diagnosis not present

## 2023-03-24 DIAGNOSIS — F5101 Primary insomnia: Secondary | ICD-10-CM | POA: Diagnosis not present

## 2023-03-24 DIAGNOSIS — Z85828 Personal history of other malignant neoplasm of skin: Secondary | ICD-10-CM | POA: Insufficient documentation

## 2023-03-24 DIAGNOSIS — M51369 Other intervertebral disc degeneration, lumbar region without mention of lumbar back pain or lower extremity pain: Secondary | ICD-10-CM | POA: Insufficient documentation

## 2023-03-24 DIAGNOSIS — K219 Gastro-esophageal reflux disease without esophagitis: Secondary | ICD-10-CM | POA: Diagnosis not present

## 2023-03-24 DIAGNOSIS — M0579 Rheumatoid arthritis with rheumatoid factor of multiple sites without organ or systems involvement: Secondary | ICD-10-CM | POA: Insufficient documentation

## 2023-03-24 DIAGNOSIS — Z79899 Other long term (current) drug therapy: Secondary | ICD-10-CM | POA: Insufficient documentation

## 2023-03-24 NOTE — Patient Instructions (Signed)
 Exercises for Tennis Elbow Elbow exercises can help you get better if you have tennis elbow. Only do the exercises you were told to do. Make sure you know how to do the exercises safely. Follow the steps below. It's normal to feel mild discomfort. Stop if you feel pain or your pain gets worse. Do not start these exercises until told by your health care provider. Stretching and range-of-motion exercises These exercises warm up your muscles and joints. They can help your elbow move better and be more flexible. Wrist flexion, assisted  Straighten your left / right elbow in front of you with your palm facing down toward the floor. If told by your provider, bend your left / right elbow to a 90-degree angle (right angle) at your side. Do this instead of holding it straight. With your other hand, gently push over the back of your left / right hand so your fingers point toward the floor. Stop when you feel a gentle stretch on the back of your forearm. Hold this position for __________ seconds. Repeat __________ times. Do this exercise __________ times a day. Wrist extension, assisted  Straighten your left / right elbow in front of you with your palm facing up toward the ceiling. If told by your provider, bend your left / right elbow to a 90-degree angle at your side. Do this instead of holding it straight. With your other hand, gently pull your left / right hand and fingers toward the floor. Stop when you feel a gentle stretch on the palm side of your forearm. Hold this position for __________ seconds. Repeat __________ times. Do this exercise __________ times a day. Assisted forearm rotation, supination  Sit or stand with your elbows at your side. Bend your left / right elbow to a 90-degree angle. Using your uninjured hand, turn your left / right palm up toward the ceiling. Stop when you feel a gentle stretch along the inside of your forearm. Hold this position for __________ seconds. Repeat  __________ times. Do this exercise __________ times a day. Assisted forearm rotation, pronation  Sit or stand with your elbows at your side. Bend your left / right elbow to a 90-degree angle. Using your uninjured hand, turn your left / right palm down toward the floor. Stop when you feel a gentle stretch along the outside of your forearm. Hold this position for __________ seconds. Repeat __________ times. Do this exercise __________ times a day. Strengthening exercises These exercises build strength and endurance in your forearm and elbow. Endurance is the ability to use your muscles for a long time, even after they get tired. Radial deviation  Stand with a __________ weight or a hammer in your left / right hand. Or, sit while holding a rubber exercise band or tubing, with your left / right forearm supported on a table or counter. Position your forearm so your thumb faces the ceiling, as if you're going to clap your hands. Raise your hand up in front of you so your thumb moves toward the ceiling, or pull up on the rubber tubing. Keep your forearm and elbow still. Only move your wrist. Hold this position for __________ seconds. Slowly go back to the starting position. Repeat __________ times. Do this exercise __________ times a day. Wrist extension, eccentric  Sit with your left / right forearm palm-down and supported on a table or other surface. Let your left / right wrist extend over the edge of the surface. Hold a __________ weight or a piece of  exercise band or tubing in your left / right hand. If using a rubber exercise band or tubing, hold the other end of the tubing with your other hand. Use your uninjured hand to move your left / right hand up toward the ceiling. Take your uninjured hand away. Slowly go back to the starting position using only your left / right hand. Repeat __________ times. Do this exercise __________ times a day. Wrist extension Do not do this exercise if it  causes pain at the outside of your elbow. Only do this exercise if told. Sit with your left / right forearm supported on a table or other surface and your palm turned down toward the floor. Let your left / right wrist extend over the edge of the surface. Hold a __________ weight or a piece of rubber exercise band or tubing. If using a rubber exercise band or tubing, hold the band or tubing in place with your other hand to provide resistance. Slowly bend your wrist so your hand moves up toward the ceiling. Move only your wrist. Keep your forearm and elbow still. Hold this position for __________ seconds. Slowly go back to the starting position. Repeat __________ times. Do this exercise __________ times a day. Forearm rotation, supination To do this exercise, you'll need a lightweight hammer or rubber mallet. Sit with your left / right forearm supported on a table or other surface. Bend your elbow to a 90-degree angle. Position your forearm so that your palm faces down toward the floor, with your hand resting over the edge of the table. Hold a hammer in your left / right hand. To make this exercise easier, hold the hammer near the head of the hammer. To make this exercise harder, hold the hammer near the end of the handle. Without moving your wrist or elbow, slowly turn your forearm so your palm faces up toward the ceiling. Hold this position for __________ seconds. Slowly go back to the starting position. Repeat __________ times. Do this exercise __________ times a day. Shoulder blade squeeze  Sit in a stable chair or stand with good posture. If you're sitting down, don't let your back touch the back of the chair. Your arms should be at your sides with your elbows bent to a 90-degree angle. Position your forearms so that your thumbs face the ceiling. Without lifting your shoulders up, squeeze your shoulder blades tightly together. Hold this position for __________ seconds. Slowly release. Go  back to the starting position. Repeat __________ times. Do this exercise __________ times a day. This information is not intended to replace advice given to you by your health care provider. Make sure you discuss any questions you have with your health care provider. Document Revised: 08/26/2022 Document Reviewed: 08/26/2022 Elsevier Patient Education  2024 [employer].

## 2023-03-25 ENCOUNTER — Telehealth: Payer: Self-pay | Admitting: Pharmacist

## 2023-03-25 ENCOUNTER — Telehealth: Payer: Self-pay

## 2023-03-25 ENCOUNTER — Ambulatory Visit: Payer: Medicare Other | Admitting: Internal Medicine

## 2023-03-25 ENCOUNTER — Telehealth: Payer: Self-pay | Admitting: *Deleted

## 2023-03-25 DIAGNOSIS — R0609 Other forms of dyspnea: Secondary | ICD-10-CM | POA: Diagnosis not present

## 2023-03-25 LAB — PULMONARY FUNCTION TEST
DL/VA % pred: 107 %
DL/VA: 4.54 ml/min/mmHg/L
DLCO cor % pred: 94 %
DLCO cor: 16.8 ml/min/mmHg
DLCO unc % pred: 94 %
DLCO unc: 16.8 ml/min/mmHg
FEF 25-75 Post: 1.22 L/s
FEF 25-75 Pre: 0.69 L/s
FEF2575-%Change-Post: 76 %
FEF2575-%Pred-Post: 71 %
FEF2575-%Pred-Pre: 40 %
FEV1-%Change-Post: 20 %
FEV1-%Pred-Post: 74 %
FEV1-%Pred-Pre: 62 %
FEV1-Post: 1.49 L
FEV1-Pre: 1.24 L
FEV1FVC-%Change-Post: 2 %
FEV1FVC-%Pred-Pre: 85 %
FEV6-%Change-Post: 18 %
FEV6-%Pred-Post: 89 %
FEV6-%Pred-Pre: 75 %
FEV6-Post: 2.26 L
FEV6-Pre: 1.9 L
FEV6FVC-%Change-Post: 1 %
FEV6FVC-%Pred-Post: 104 %
FEV6FVC-%Pred-Pre: 103 %
FVC-%Change-Post: 17 %
FVC-%Pred-Post: 85 %
FVC-%Pred-Pre: 72 %
FVC-Post: 2.26 L
FVC-Pre: 1.93 L
Post FEV1/FVC ratio: 66 %
Post FEV6/FVC ratio: 100 %
Pre FEV1/FVC ratio: 64 %
Pre FEV6/FVC Ratio: 99 %
RV % pred: 181 %
RV: 3.78 L
TLC % pred: 125 %
TLC: 5.88 L

## 2023-03-25 NOTE — Progress Notes (Signed)
 Full PFT performed today.

## 2023-03-25 NOTE — Telephone Encounter (Signed)
 Waddell and Devki, patient will be scheduled as soon as possible.  Auth Submission: NO AUTH NEEDED Site of care: Site of care: [facility] Payer: Medicare A/B with [employer] supplement Medication & CPT/J Code(s) submitted: Orencia  (Abatacept ) G9870 Route of submission (phone, fax, portal):  Phone # Fax # Auth type: Buy/Bill PB Units/visits requested: 500mg  x 12 doses Reference number:  Approval from: 03/25/23 to 03/17/24

## 2023-03-25 NOTE — Telephone Encounter (Signed)
 Noted - was not advised of this so assumed patient was immediately switching for next infusion.   Will route ot Waddell Craze, PA-C as RICK I will need to close our referral to infusion center so that they do not continue outreach  Sherry Pennant, PharmD, MPH, BCPS, CPP Clinical Pharmacist (Rheumatology and Pulmonology)

## 2023-03-25 NOTE — Telephone Encounter (Addendum)
 Orders placed for [facility]. They will need to run benefits first before scheduling  Dose: ORENCIA  500mg  IV every 4 weeks Premeds: acetaminophen  650mg  p.o. and diphenhydramine  25mg  p.o.  Kristy Perez, PharmD, MPH, BCPS, CPP Clinical Pharmacist (Rheumatology and Pulmonology)  ----- Message ----- From: Kristy Perez, CMA Sent: 03/24/2023   4:18 PM EST To: Rx Rheum/Pulm  Kristy Perez has asked if patient's infusions be switched to [facility]? She is currently going to [facility]. Thanks!

## 2023-03-25 NOTE — Patient Instructions (Signed)
 Full PFT performed today.

## 2023-03-25 NOTE — Addendum Note (Signed)
 Addended by: Thais Fill on: 03/25/2023 03:23 PM   Modules accepted: Orders

## 2023-03-25 NOTE — Telephone Encounter (Signed)
 Patient states she received a call from the [facility] to schedule an appointment. Patient states she is not ready to switch sites yet.

## 2023-03-28 NOTE — Telephone Encounter (Signed)
 Is patient going to notify us  when she is ready to switch?

## 2023-03-28 NOTE — Telephone Encounter (Signed)
 There will be no change in cost for her--the switched has been approved by insurance and they can schedule her for her next infusion if she would like to switch

## 2023-03-28 NOTE — Telephone Encounter (Signed)
 Attempted to contact the patient and left message for patient to call the office.

## 2023-03-28 NOTE — Telephone Encounter (Signed)
 Patient did not advised if or when she may want to switch. She stated she was under the impression that it was sent to the pharmacy team just to find out the cost difference if there was one.

## 2023-03-29 NOTE — Telephone Encounter (Signed)
Attempted to contact the patient and left message for patient to call the office.

## 2023-03-29 NOTE — Telephone Encounter (Signed)
Patient advised there will be no change in cost for her--the switched has been approved by insurance and they can schedule her for her next infusion if she would like to switch. Patient states she would not like to switch at this time but will contact the office if she does want to switch.

## 2023-04-01 NOTE — Telephone Encounter (Signed)
Patient does not want to switch infusion centers at this time. Closing encounter

## 2023-04-11 DIAGNOSIS — R3982 Chronic bladder pain: Secondary | ICD-10-CM | POA: Diagnosis not present

## 2023-04-11 DIAGNOSIS — N393 Stress incontinence (female) (male): Secondary | ICD-10-CM | POA: Diagnosis not present

## 2023-04-11 DIAGNOSIS — R102 Pelvic and perineal pain: Secondary | ICD-10-CM | POA: Diagnosis not present

## 2023-04-12 ENCOUNTER — Other Ambulatory Visit: Payer: Self-pay | Admitting: Internal Medicine

## 2023-04-12 ENCOUNTER — Encounter: Payer: Self-pay | Admitting: Neurology

## 2023-04-12 ENCOUNTER — Ambulatory Visit (INDEPENDENT_AMBULATORY_CARE_PROVIDER_SITE_OTHER): Payer: Medicare Other | Admitting: Neurology

## 2023-04-12 VITALS — BP 126/73 | HR 102 | Ht 61.5 in | Wt 126.0 lb

## 2023-04-12 DIAGNOSIS — R200 Anesthesia of skin: Secondary | ICD-10-CM

## 2023-04-12 DIAGNOSIS — M0579 Rheumatoid arthritis with rheumatoid factor of multiple sites without organ or systems involvement: Secondary | ICD-10-CM | POA: Diagnosis not present

## 2023-04-12 DIAGNOSIS — G603 Idiopathic progressive neuropathy: Secondary | ICD-10-CM | POA: Diagnosis not present

## 2023-04-12 DIAGNOSIS — Z8669 Personal history of other diseases of the nervous system and sense organs: Secondary | ICD-10-CM | POA: Diagnosis not present

## 2023-04-12 DIAGNOSIS — Z79899 Other long term (current) drug therapy: Secondary | ICD-10-CM

## 2023-04-12 MED ORDER — BUDESONIDE-FORMOTEROL FUMARATE 80-4.5 MCG/ACT IN AERO: 2.0000 | INHALATION_SPRAY | Freq: Two times a day (BID) | RESPIRATORY_TRACT | 12 refills | Status: DC

## 2023-04-12 NOTE — Progress Notes (Signed)
Tried calling the pt and there was no answer- LMTCB.  

## 2023-04-12 NOTE — Progress Notes (Signed)
 [facility]  [name] DOB: Dec 15, 1950  REFERRING DOCTOR OR PCP:  Dr. Corliss Skains SOURCE: patient, notes from Dr. Corliss Skains, labs  _________________________________   HISTORICAL  CHIEF COMPLAINT:  Chief Complaint  Patient presents with   Follow-up    RM 10, alone, Rheumatoid arthritis with rheumatoid factor of multiple sites without organ or systems involvement: doing well, Idiopathic progressive neuropathy: finger tips and tips of toes numbness, History of Chiari malformation, Osteoarthritis of cervical spine: stated spine and back pain tolerable     HISTORY OF PRESENT ILLNESS:  Kristy Perez is a 73 y.o. woman on Orencia infusions for her rheumatoid arthritis.  Update 04/12/2023: She is on Orencia for rheumatoid arthritis. She gets the labwork every other month and results are stable (lab work is done with rheumatology) .  She tolerates the infusions well.  She does feel that her rheumatoid arthritis symptoms improved when she started the medication and she feels stable at this time.  She does still note some pain likely due to the rheumatoid arthritis in the neck and hands.  MRI of the cervical spine was reviewed.  She does have mild degenerative pannus at C1-C2 as well as mildly low hanging cerebellar tonsils (not enough to be considered a Chiari malformation).     In 2020 she had L2-L3 lumbar surgery which has helped symptoms in her back and legs.   SHe walks 4-5 miles daily.    She averages 10-12000 steps a day.   She is volunteering doing physical activities as well.  She has mild numbness in her fingertips bilaterally. She has mold reduced strength in her hands.   She notes a riht foot drop if she walks longer.   She has mild numbness in toes.    She had breast cancer 20 years ago.   She has had basal cell cancer several times on face.       The cerebellar tonsils are mildly low but not enough to be considered a Chiari malformation.  She denies  headaches or syncope. She does note that if she calls she gets a headache.   She had cystoscopy recently for urinary incontinence and is being followed by urology.  MRI of the cervical spine from 2022 shows:..     She has 3-4 mm of cerebellar ectopia.  She gets brain or cervical spine studies periodically to follow this as she is at risk due to her rheumatoid arthritis and potential pannus development. Adjacent brain appears normal.  Due to the numbness we had checked  NCV/EMG 04/01/2020.  The study showed the following: 1.  No evidence of polyneuropathy 2.  Borderline median neuropathy across the right wrist.  There is no evidence of right focal ulnar neuropathy. 3.  Mild right L3 (+/- L4),  L5 and S1 chronic radiculopathies  Allergies:   Iodine/fish - anaphylaxis Boniva, Fosamax - vomiting Paxlovid - vomiting Sulfa - tongue turned black but no anaphylaxis  Rheumatoid arthritis history: She had had problems with joint pain, especially in her feet times many years. Around 2002, she was diagnosed with rheumatoid arthritis after a rheumatoid factor returned positive. He was to start methotrexate and Plaquenil but was diagnosed with breast cancer around that time so those medicines were discontinued while she was undergoing treatment with breast cancer. She had a complete response to her treatment (surgery, chemotherapy and radiation) remains cancer free.   After her chemotherapy, she went back on plaquenil until plus methotrexate injections. About 12 years ago, she switched  to Humira and was on that for about 8 years. She had many dental issues including abscesses and it was discontinued. Enbrel was tried but it was less effective for her RA. About 4 years ago she started Orencia infusions. She has tolerated them well and has not had any problems with infections. Typically, the day of the infusion she will fill tired or achy. The next day she may also feel tired. The rest of the month she does well..     She is on Orencia 500 mg IV every 4 weeks.    She had her last infusion 06/15/2016.     She has allergies to Iodine injection (xray/CT) and fish.  She does ok with Betadine.    Besides breast cancer, she has had had several Basal cell and squamous cell cancers.   She had Mohs surgery on her face.      Chiari malformation history: She also has had a Chiari Malformation and was seeing Dr. Channing Mutters of Neurosurgery.   Because she also has risk of pannus development from her rheumatoid arthritis, and the MRI on an annual or near annual basis.   Imaging: MRI of her brain January 2018 shows a borderline Chiari malformation (looked mild on prior MRI). The adjacent brain and spinal cord have normal signal.  She has some degenerative pannus at C1-C2.  MRI of the lumbar spine 06/20/2019 shows postoperative changes at L2-L3 (right extraforaminal microdiscectomy) there is stable grade 1 spondylolisthesis due to pars defects at L5 and associated with facet hypertrophy and foraminal narrowing.  MRI cervical spine 04/03/2020 showed :  The spinal cord appears normal.    The cerebellar tonsils extend just below the foramen magnum but not enough to be considered a Chiari malformation.  There is no stenosis at the foramen magnum.   Mild multilevel degenerative changes as detailed above that do not lead to nerve root compression or significant spinal stenosis..   Degenerative changes show no significant progression compared to the 09/22/2004 MRI.  REVIEW OF SYSTEMS: Constitutional: No fevers, chills, sweats, or change in appetite.    She gets fatigue the last week of each Orencia cycle.     Eyes: No visual changes, double vision, eye pain Ear, nose and throat: No hearing loss, ear pain, nasal congestion, sore throat Cardiovascular: No chest pain, palpitations Respiratory:  No shortness of breath at rest or with exertion.   No wheezes GastrointestinaI: No nausea, vomiting, diarrhea, abdominal pain, fecal incontinence.   She  has GERD Genitourinary:  No dysuria, urinary retention.   She notes urinary frequency and nocturia. Musculoskeletal:  as above Integumentary: No rash, pruritus, skin lesions Neurological: as above.   She notes decreased focus, improved with Adderall Psychiatric: No depression at this time.  No anxiety Endocrine: No palpitations, diaphoresis, change in appetite, change in weigh or increased thirst Hematologic/Lymphatic:  No anemia, purpura, petechiae. Allergic/Immunologic: No itchy/runny eyes, nasal congestion, recent allergic reactions, rashes  ALLERGIES: Allergies  Allergen Reactions   Fish Allergy Anaphylaxis   Iodine Anaphylaxis    " PER PT IV CONTRAST"   Boniva [Ibandronate]     vomiting    Crestor [Rosuvastatin]     myalgia   Fosamax [Alendronate]     vomiting    Paxlovid [Nirmatrelvir-Ritonavir]    Sulfonamide Derivatives Other (See Comments)    Tongue turns black.    HOME MEDICATIONS:  Current Outpatient Medications:    Abatacept (ORENCIA IV), Inject 500 mg into the vein every 30 (thirty) days. Infuse 2  vials / 500 mg over 30 minutes every 4 weeks, Disp: , Rfl:    acetaminophen (TYLENOL) 325 MG tablet, Take 650 mg by mouth daily as needed (One hour prior to infusion). , Disp: , Rfl:    ADVAIR HFA 45-21 MCG/ACT inhaler, SMARTSIG:2 Puff(s) By Mouth Twice Daily, Disp: , Rfl:    albuterol (VENTOLIN HFA) 108 (90 Base) MCG/ACT inhaler, Inhale 2 puffs into the lungs every 6 (six) hours as needed for wheezing or shortness of breath., Disp: , Rfl:    amoxicillin (AMOXIL) 500 MG tablet, Before dental procedures, Disp: , Rfl:    aspirin EC 81 MG tablet, Take 1 tablet (81 mg total) by mouth daily., Disp: 90 tablet, Rfl: 3   calcium carbonate ([facility] CALCIUM) 600 MG tablet, Take 600 mg by mouth 2 (two) times daily with a meal., Disp: , Rfl:    chlorpheniramine (CHLOR-TRIMETON) 4 MG tablet, Take 4 mg by mouth daily as needed for allergies., Disp: , Rfl:    cycloSPORINE (RESTASIS) 0.05 %  ophthalmic emulsion, 1 drop 2 (two) times daily., Disp: , Rfl:    diphenhydrAMINE (BENADRYL) 25 mg capsule, Take 25 mg by mouth daily as needed (One hour prior to infusion). , Disp: , Rfl:    ezetimibe (ZETIA) 10 MG tablet, Take 10 mg by mouth daily., Disp: , Rfl:    famotidine (PEPCID) 20 MG tablet, One after  bfast and supper, Disp: 60 tablet, Rfl: 11   ibuprofen (ADVIL,MOTRIN) 200 MG tablet, Take 400 mg by mouth every 6 (six) hours as needed (for inflammation)., Disp: , Rfl:    Liniments (SALONPAS EX), Apply topically as needed., Disp: , Rfl:    Magnesium 250 MG TABS, Take 500-1,000 tablets by mouth daily. , Disp: , Rfl:    montelukast (SINGULAIR) 10 MG tablet, Take 10 mg by mouth daily., Disp: , Rfl:    NATURAL VITAMIN D-3 125 MCG (5000 UT) TABS, TAKE 1 TABLET BY MOUTH EVERY DAY, Disp: 200 tablet, Rfl: 1   NONFORMULARY OR COMPOUNDED ITEM, Estradiol 0.02% vaginal cream.  Place 1/2 gram vaginally two to three times weekly at bedtime. Disp:  3 month supply (Patient taking differently: Place 0.5 Applicatorfuls vaginally once a week. Estradiol 0.02% vaginal cream.  Place 1/2 gram vaginally two to three times weekly at bedtime. Disp:  3 month supply), Disp: 1 each, Rfl: 3   promethazine (PHENERGAN) 25 MG tablet, Take 25 mg by mouth every 6 (six) hours as needed for nausea or vomiting., Disp: , Rfl:    Sodium Fluoride 1.1 % PSTE, Place 1 application onto teeth 2 (two) times daily. , Disp: , Rfl:    temazepam (RESTORIL) 30 MG capsule, Take 30 mg by mouth at bedtime. , Disp: , Rfl:   PAST MEDICAL HISTORY: Past Medical History:  Diagnosis Date   Anemia    Asthma    inhaler one x per day   Breast cancer (HCC)    Cancer (HCC)    breast, left   Chiari malformation    Difficult intubation    cannot hyperextend neck due to Chiari malformation with tonsillar involvement, RA   Dysrhythmia 2015   history of PAC's   GERD (gastroesophageal reflux disease)    High cholesterol    dx by PCP per patient     Personal history of radiation therapy    PONV (postoperative nausea and vomiting)    Rheumatoid arthritis (HCC)    on Humira   RSV (respiratory syncytial virus infection) 09/08/2022  PAST SURGICAL HISTORY: Past Surgical History:  Procedure Laterality Date   BREAST SURGERY Left 2003   Lumpectomy T1C1, NO and negative ER/ PR   COLONOSCOPY  10/04   COLONOSCOPY  11/10   normal recheck in 5 years   COLPORRHAPHY     ESOPHAGOGASTRODUODENOSCOPY ENDOSCOPY  11/10   GERD   FOOT SURGERY Left age 79   removal of pseudo rheumatoid nodules from left forefoot.   LAPAROSCOPIC BILATERAL SALPINGO OOPHERECTOMY N/A 07/30/2013   Procedure: LAPAROSCOPIC BILATERAL SALPINGO OOPHORECTOMY WITH WASHINGS;  Surgeon: Annamaria Boots, MD;  Location: [facility];  Service: Gynecology;  Laterality: N/A;   LUMBAR LAMINECTOMY/DECOMPRESSION MICRODISCECTOMY Right 04/06/2018   Procedure: Extraforaminal Microdiscectomy - Lumbar Two-Lumbar Three - right;  Surgeon: Tia Alert, MD;  Location: [facility] OR;  Service: Neurosurgery;  Laterality: Right;  Extraforaminal Microdiscectomy - Lumbar Two-Lumbar Three - right   NASAL SEPTOPLASTY W/ TURBINOPLASTY Bilateral 05/24/2014   Procedure: NASAL SEPTOPLASTY WITH  BILATERAL TURBINATE REDUCTION;  Surgeon: Osborn Coho, MD;  Location: [facility] OR;  Service: ENT;  Laterality: Bilateral;   PELVIC FLOOR REPAIR  3/04   SPARC with AP colporrhaphy   PILONIDAL CYST EXCISION     TOTAL VAGINAL HYSTERECTOMY  1989   with AP repair    FAMILY HISTORY: Family History  Problem Relation Age of Onset   Diabetes Mother    Heart failure Mother    Bladder Cancer Mother    Stroke Mother    Cancer Father        head and neck   Uterine cancer Maternal Aunt    Bladder Cancer Maternal Grandmother    Colon cancer Maternal Grandmother    Breast cancer Cousin     SOCIAL HISTORY:  Social History   Socioeconomic History   Marital status: Divorced    Spouse name: Not on file   Number of children: 2    Years of education: Not on file   Highest education level: Not on file  Occupational History   Not on file  Tobacco Use   Smoking status: Former    Current packs/day: 0.00    Average packs/day: 1.5 packs/day for 30.0 years (45.0 ttl pk-yrs)    Types: Cigarettes    Start date: 02/15/1969    Quit date: 02/16/1999    Years since quitting: 24.1    Passive exposure: Past   Smokeless tobacco: Never  Vaping Use   Vaping status: Never Used  Substance and Sexual Activity   Alcohol use: No   Drug use: No   Sexual activity: Not Currently    Partners: Male  Other Topics Concern   Not on file  Social History Narrative   Lives alone in a 2 story home. Has 2 children.  Works as a Catering manager.  Education: college.    Social Drivers of Corporate investment banker Strain: Not on file  Food Insecurity: Not on file  Transportation Needs: Not on file  Physical Activity: Not on file  Stress: Not on file  Social Connections: Not on file  Intimate Partner Violence: Not on file     PHYSICAL EXAM  Vitals:   04/12/23 1043  BP: 126/73  Pulse: (!) 102  Weight: 126 lb (57.2 kg)  Height: 5' 1.5" (1.562 m)     Body mass index is 23.42 kg/m.   General: The patient is well-developed and well-nourished and in no acute distress   Neck:  The neck is nontender.   Skin: Extremities are without rash or edema.  Musculoskeletal:  Back is nontender.   Joints in her feet and hands are tender.  There is ulnar deviation of the fingers bilaterally, R>l.  She has a nodule on the right wrist.  enlarged CMC joint on right.   Neurologic Exam  Mental status: The patient is alert and oriented x 3 at the time of the examination. The patient has apparent normal recent and remote memory, with an apparently normal attention span and concentration ability.   Speech is normal.  Cranial nerves: Extraocular movements are full. Facial strength and sensation is normal.  Trapezius and sternocleidomastoid strength is  normal.  . No obvious hearing deficits are noted.  Motor:  Muscle tone is normal. Muscle bulk is reduced in the ulnar innervated right hand muscles.  3 to 4-/5 right ulnar hand muscle strength.  4+/5 bilateral APB muscles (median) 4/5 right EHL and 4+/5 right ankle extensors .  4+/5 left EHL.     Strength is 5/5.elsewhere  Sensory;  She has. Reduced touch but normal vibration in fingertips, superimpose reduced sensation to pp hypothenar eminence, right worse than left.  Reduced sensation to vibration in toes (near absent in toes and 50% at ankles but normal at ankles).  Reduced pinprick in toes adjacent feet, right peroneal/L5 distribution moreso than lateral foot   Just mildly redued pinprick plantars.    Coordination: Cerebellar testing reveals good finger-nose-finger bilaterally.   Mild reduced coordination in hands and toe tap.    Gait and station: Station is normal.  Gait is mildly arthritic and she has a very mild right foot drop.  Her tandem gait is mildly wide.  The Romberg is negative.  Reflexes: Deep tendon reflexes are symmetric and normal bilaterally except absnet at ankles.Marland Kitchen        DIAGNOSTIC DATA (LABS, IMAGING, TESTING) - I reviewed patient records, labs, notes, testing and imaging myself where available.  Lab Results  Component Value Date   WBC 8.4 11/30/2022   HGB 13.0 11/30/2022   HCT 39.7 11/30/2022   MCV 88 11/30/2022   PLT 387 11/30/2022      Component Value Date/Time   NA 139 11/30/2022 1159   K 4.3 11/30/2022 1159   CL 101 11/30/2022 1159   CO2 22 11/30/2022 1159   GLUCOSE 86 11/30/2022 1159   GLUCOSE 91 03/30/2018 1051   BUN 13 11/30/2022 1159   CREATININE 0.56 (L) 11/30/2022 1159   CALCIUM 9.5 11/30/2022 1159   PROT 6.6 11/30/2022 1159   ALBUMIN 4.5 11/30/2022 1159   AST 19 11/30/2022 1159   ALT 16 11/30/2022 1159   ALKPHOS 82 11/30/2022 1159   BILITOT 0.4 11/30/2022 1159   GFRNONAA 98 03/31/2020 1049   GFRAA 114 03/31/2020 1049   Lab Results   Component Value Date   CHOL 228 (H) 07/07/2017   HDL 68 07/07/2017   LDLCALC 142 (H) 07/07/2017   TRIG 91 07/07/2017   CHOLHDL 3.4 07/07/2017   Lab Results  Component Value Date   HGBA1C 5.2 03/08/2016   Lab Results  Component Value Date   VITAMINB12 280 03/31/2020   Lab Results  Component Value Date   TSH 1.20 03/08/2016       ASSESSMENT AND PLAN  Rheumatoid arthritis with rheumatoid factor of multiple sites without organ or systems involvement (HCC)  High risk medication use  History of Chiari malformation  Idiopathic progressive neuropathy  Numbness    1.    She will continue Orencia 500 mg every 4 weeks.  Labs will  be checked every 8 weeks and have looked good. 2.    The Chiari malformation and the degenerative pannus at C1-C2 appear stable on last MRi and no evidence on exam of compression.  3.   Polyneuropathy is stable.  Advised to take care in the dark or on uneven surfaces.   3.    return in one year for general visit in regards to infusion.   She will call sooner if she has any new or worsening neurologic symptoms.  This visit is part of a comprehensive longitudinal care medical relationship regarding the patients primary diagnosis of RA and related concerns.    Rayden Scheper A. Epimenio Foot, MD, PhD 04/12/2023, 11:00 AM Certified in Neurology, Clinical Neurophysiology, Sleep Medicine, Pain Medicine and Neuroimaging  [facility] [address] [place], [place] [address] [phone]

## 2023-04-12 NOTE — Addendum Note (Signed)
 Addended by: Asa Lente on: 04/12/2023 12:25 PM   Modules accepted: Orders

## 2023-04-12 NOTE — Progress Notes (Signed)
 Pt notified of results  I mailed her copy per her request  Generic symbicort sent

## 2023-04-13 ENCOUNTER — Other Ambulatory Visit: Payer: Self-pay | Admitting: *Deleted

## 2023-04-13 ENCOUNTER — Other Ambulatory Visit: Payer: Self-pay

## 2023-04-13 DIAGNOSIS — M0579 Rheumatoid arthritis with rheumatoid factor of multiple sites without organ or systems involvement: Secondary | ICD-10-CM

## 2023-04-13 DIAGNOSIS — M0519 Rheumatoid lung disease with rheumatoid arthritis of multiple sites: Secondary | ICD-10-CM | POA: Diagnosis not present

## 2023-04-14 LAB — CBC WITH DIFFERENTIAL/PLATELET
Basophils Absolute: 0.1 10*3/uL (ref 0.0–0.2)
Basos: 1 %
EOS (ABSOLUTE): 0.7 10*3/uL — ABNORMAL HIGH (ref 0.0–0.4)
Eos: 7 %
Hematocrit: 34.5 % (ref 34.0–46.6)
Hemoglobin: 11.4 g/dL (ref 11.1–15.9)
Immature Grans (Abs): 0 10*3/uL (ref 0.0–0.1)
Immature Granulocytes: 0 %
Lymphocytes Absolute: 2.8 10*3/uL (ref 0.7–3.1)
Lymphs: 28 %
MCH: 28.6 pg (ref 26.6–33.0)
MCHC: 33 g/dL (ref 31.5–35.7)
MCV: 87 fL (ref 79–97)
Monocytes Absolute: 0.8 10*3/uL (ref 0.1–0.9)
Monocytes: 8 %
Neutrophils Absolute: 5.5 10*3/uL (ref 1.4–7.0)
Neutrophils: 56 %
Platelets: 317 10*3/uL (ref 150–450)
RBC: 3.98 x10E6/uL (ref 3.77–5.28)
RDW: 12.5 % (ref 11.7–15.4)
WBC: 9.8 10*3/uL (ref 3.4–10.8)

## 2023-04-14 LAB — COMPREHENSIVE METABOLIC PANEL
ALT: 16 IU/L (ref 0–32)
AST: 23 IU/L (ref 0–40)
Albumin: 4.1 g/dL (ref 3.8–4.8)
Alkaline Phosphatase: 77 IU/L (ref 44–121)
BUN/Creatinine Ratio: 32 — ABNORMAL HIGH (ref 12–28)
BUN: 16 mg/dL (ref 8–27)
Bilirubin Total: 0.2 mg/dL (ref 0.0–1.2)
CO2: 19 mmol/L — ABNORMAL LOW (ref 20–29)
Calcium: 8.7 mg/dL (ref 8.7–10.3)
Chloride: 102 mmol/L (ref 96–106)
Creatinine, Ser: 0.5 mg/dL — ABNORMAL LOW (ref 0.57–1.00)
Globulin, Total: 1.7 g/dL (ref 1.5–4.5)
Glucose: 150 mg/dL — ABNORMAL HIGH (ref 70–99)
Potassium: 5.2 mmol/L (ref 3.5–5.2)
Sodium: 139 mmol/L (ref 134–144)
Total Protein: 5.8 g/dL — ABNORMAL LOW (ref 6.0–8.5)
eGFR: 100 mL/min/{1.73_m2} (ref >59)

## 2023-04-18 ENCOUNTER — Telehealth: Payer: Self-pay | Admitting: *Deleted

## 2023-04-18 NOTE — Telephone Encounter (Signed)
 Spoke to patient gave labwork results Pt requested labwork be mailed to her home and send results to PCP . Sent pcp labwork results and sent mailing request to medical records . Pt thanked me for calling

## 2023-04-18 NOTE — Telephone Encounter (Signed)
-----   Message from Asa Lente sent at 04/14/2023  7:55 AM EST ----- Please let the patient know that the lab work is fine.

## 2023-04-22 ENCOUNTER — Telehealth: Payer: Self-pay | Admitting: Internal Medicine

## 2023-04-22 NOTE — Telephone Encounter (Signed)
 Called pt lvm for her to call us back, also I spoke with [facility]  talk with pharmacist he tried to run wilxea though and the was $156 , pt can't afford this

## 2023-04-22 NOTE — Telephone Encounter (Signed)
 Spoke to patient. She is aware that Jerral Ralph has been sent to preferred pharmacy.  Nothing further needed.

## 2023-04-22 NOTE — Telephone Encounter (Signed)
 Patient is trying to return missed call in regards to her medication.

## 2023-05-03 DIAGNOSIS — R229 Localized swelling, mass and lump, unspecified: Secondary | ICD-10-CM | POA: Diagnosis not present

## 2023-05-03 DIAGNOSIS — Z86007 Personal history of in-situ neoplasm of skin: Secondary | ICD-10-CM | POA: Diagnosis not present

## 2023-05-03 DIAGNOSIS — D485 Neoplasm of uncertain behavior of skin: Secondary | ICD-10-CM | POA: Diagnosis not present

## 2023-05-03 DIAGNOSIS — D0439 Carcinoma in situ of skin of other parts of face: Secondary | ICD-10-CM | POA: Diagnosis not present

## 2023-05-03 DIAGNOSIS — Z872 Personal history of diseases of the skin and subcutaneous tissue: Secondary | ICD-10-CM | POA: Diagnosis not present

## 2023-05-03 DIAGNOSIS — Z08 Encounter for follow-up examination after completed treatment for malignant neoplasm: Secondary | ICD-10-CM | POA: Diagnosis not present

## 2023-05-03 DIAGNOSIS — Z09 Encounter for follow-up examination after completed treatment for conditions other than malignant neoplasm: Secondary | ICD-10-CM | POA: Diagnosis not present

## 2023-05-03 DIAGNOSIS — L578 Other skin changes due to chronic exposure to nonionizing radiation: Secondary | ICD-10-CM | POA: Diagnosis not present

## 2023-05-25 DIAGNOSIS — D0439 Carcinoma in situ of skin of other parts of face: Secondary | ICD-10-CM | POA: Diagnosis not present

## 2023-06-08 DIAGNOSIS — N393 Stress incontinence (female) (male): Secondary | ICD-10-CM | POA: Diagnosis not present

## 2023-06-08 DIAGNOSIS — R102 Pelvic and perineal pain: Secondary | ICD-10-CM | POA: Diagnosis not present

## 2023-06-08 DIAGNOSIS — M6289 Other specified disorders of muscle: Secondary | ICD-10-CM | POA: Diagnosis not present

## 2023-06-08 DIAGNOSIS — M6281 Muscle weakness (generalized): Secondary | ICD-10-CM | POA: Diagnosis not present

## 2023-06-09 DIAGNOSIS — M0519 Rheumatoid lung disease with rheumatoid arthritis of multiple sites: Secondary | ICD-10-CM | POA: Diagnosis not present

## 2023-06-21 DIAGNOSIS — Z1211 Encounter for screening for malignant neoplasm of colon: Secondary | ICD-10-CM | POA: Diagnosis not present

## 2023-06-21 DIAGNOSIS — K219 Gastro-esophageal reflux disease without esophagitis: Secondary | ICD-10-CM | POA: Diagnosis not present

## 2023-06-26 NOTE — Progress Notes (Unsigned)
 Kristy Perez, female    DOB: 1950-11-07,     MRN: [id number]   Brief patient profile:  [id number] quit smoking 2001 with hx of asthma as child outgrew age 73  onset feet aching mid 1990s eventually dx as RA and some AB on saba while smoking 2001 and  And AB apparently resolved completely off cigs for a few years but then started needing more saba so rx   dulera  100 just one puff daily   Which eliminated need for saba   and did fine until around winter 2019 sob pc every daily either lunch=supper not related to quantity or quality of food and  not better p saba so referred to pulmonary clinic 10/10/2018 by Dr   Alvira Josephs.  Was on ppi until around 2015 and pepcid  and daily has sensation of hb worse since stopped ppi as "worried about the cost and reported side effects.  - PFT's  03/25/23  FEV1 1.39 (74 % ) ratio 0.66  p 20 % improvement from saba p 0 prior to study with DLCO  16.8 (94%)   and FV curve mildly concave     History of Present Illness  10/10/2018  Pulmonary/ 1st office eval/Fransisco Messmer  Chief Complaint  Patient presents with   Pulmonary Consult    Referred by Dr. Alvira Josephs for eval of SOB. [name].   Dyspnea:  MMRC1 = can walk nl pace, flat grade, can't hurry or go uphills or steps s sob   Cough: no Sleep: fine p temazepam/ one pillow on side bed flat no resp c/os SABA use: none now rec Plan A = Automatic = Start symbicort  80 Take 2 puffs first thing in am and then another 2 puffs about 12 hours later.  Work on maintaining optimal  inhaler technique: Plan B = Backup Only use your albuterol  inhaler as a rescue medication  Change pepcid  20 mg to take after bfast and after supper  for 4  weeks       11/28/2018  f/u ov/Mata Rowen re: RA, hoarseness, unexplained sob improved on symb Chief Complaint  Patient presents with   Follow-up    Breathing is overall doing well. She is using her proair  once daily on average.   Dyspnea:  Able to  walk up 4 miles flat ok,  One flight and sob "panting" x 3 years no change on symb 80 = MMRC1 = can walk nl pace, flat grade, can't hurry or go uphills or steps s sob   Better with meals now  Cough: hoarseness p symb Sleeping: fine after temazepam SABA use: one daily s pattern occurs at rest not noct  02: none  Rec See Melissa Spring for your hoarseness and possible of CA (cricoaretenoiditis) > neg eval  Try symbicort  80 dosed just one twice daily (immediate on awakening and 12 hours later)  to see if ex tolerance changes or your need for albuterol  goes up and if so resume Take 2 puffs first thing in am and then another 2 puffs about 12 hours later.  Only use your albuterol  as a rescue medication to   11/18/2022  f/u ov/Torren Maffeo re: RA/ hoarseness.sob  maint on advair 45  2bid  sometimes pepcid   Chief Complaint  Patient presents with   Consult    SOB x 5 years  Dyspnea:  MMRC1 = can walk nl pace, flat grade, can't hurry or go uphills or steps s sob  Cough: when gets hot / perfume also > very hoarse with coughing fits and urge to clear throat  Sleeping: flat bed one pillow s  resp cc  SABA use: none  02: none  Rec Make sure you check your oxygen saturation at your highest level of activity(NOT after you stop)  to be sure it stays over 90%  Try advair 45  2 puffs each am with the 3rd 4th puff option if doing great and not needing any rescue  GERD diet reviewed, bed blocks rec    - PFT's  03/25/23  FEV1 1.39 (74 % ) ratio 0.66  p 20 % improvement from saba p 0 prior to study with DLCO  16.8 (94%)   and FV curve mildly concave      06/28/2023  f/u ov/[place] office/Kelbi Renstrom re: GOLD 2 copd/ AB  maint on symbicort  80  2bid  Chief Complaint  Patient presents with   Follow-up    F/u after PFT.  Dyspnea:  MMRC1 - hasn't tried power walks yet since starting symbicort   Cough: none  Sleeping: flat bed on side one pillow s  resp cc  SABA use: none unless exposed to heat (artificial or outdoors)  02:  none    No obvious day to day or daytime variability or assoc excess/ purulent sputum or mucus plugs or hemoptysis or cp or chest tightness, subjective wheeze or overt sinus or hb symptoms.    Also denies any obvious fluctuation of symptoms with weather or environmental changes or other aggravating or alleviating factors except as outlined above   No unusual exposure hx or h/o childhood pna  or knowledge of premature birth.  Current Allergies, Complete Past Medical History, Past Surgical History, Family History, and Social History were reviewed in [name].  ROS  The following are not active complaints unless bolded Hoarseness, sore throat, dysphagia, dental problems, itching, sneezing,  nasal congestion or discharge of excess mucus or purulent secretions, ear ache,   fever, chills, sweats, unintended wt loss or wt gain, classically pleuritic or exertional cp,  orthopnea pnd or arm/hand swelling  or leg swelling, presyncope, palpitations, abdominal pain, anorexia, nausea, vomiting, diarrhea  or change in bowel habits or change in bladder habits, change in stools or change in urine, dysuria, hematuria,  rash, arthralgias, visual complaints, headache, numbness, weakness or ataxia or problems with walking or coordination,  change in mood or  memory.        Current Meds  Medication Sig   Abatacept  (ORENCIA  IV) Inject 500 mg into the vein every 30 (thirty) days. Infuse 2 vials / 500 mg over 30 minutes every 4 weeks   acetaminophen  (TYLENOL ) 325 MG tablet Take 650 mg by mouth daily as needed (One hour prior to infusion).    albuterol  (VENTOLIN  HFA) 108 (90 Base) MCG/ACT inhaler Inhale 2 puffs into the lungs every 6 (six) hours as needed for wheezing or shortness of breath.   amoxicillin  (AMOXIL ) 500 MG tablet Before dental procedures   aspirin  EC 81 MG tablet Take 1 tablet (81 mg total) by mouth daily.   budesonide -formoterol  (BREYNA ) 80-4.5 MCG/ACT inhaler Take 2  puffs first thing in am and then another 2 puffs about 12 hours later.   calcium  carbonate ([employer] CALCIUM ) 600 MG tablet Take 600 mg by mouth 2 (two) times daily with a meal.   chlorpheniramine (CHLOR-TRIMETON) 4 MG tablet Take 4 mg by mouth daily as needed for allergies.   cycloSPORINE  (RESTASIS ) 0.05 %  ophthalmic emulsion 1 drop 2 (two) times daily.   diphenhydrAMINE  (BENADRYL ) 25 mg capsule Take 25 mg by mouth daily as needed (One hour prior to infusion).    ezetimibe (ZETIA) 10 MG tablet Take 10 mg by mouth daily.   famotidine  (PEPCID ) 20 MG tablet One after  bfast and supper   ibuprofen (ADVIL,MOTRIN) 200 MG tablet Take 400 mg by mouth every 6 (six) hours as needed (for inflammation).   Liniments (SALONPAS EX) Apply topically as needed.   Magnesium 250 MG TABS Take 500-1,000 tablets by mouth daily.    montelukast (SINGULAIR) 10 MG tablet Take 10 mg by mouth daily.   NATURAL VITAMIN D -3 125 MCG (5000 UT) TABS TAKE 1 TABLET BY MOUTH EVERY DAY   NONFORMULARY OR COMPOUNDED ITEM Estradiol  0.02% vaginal cream.  Place 1/2 gram vaginally two to three times weekly at bedtime. Disp:  3 month supply (Patient taking differently: Place 0.5 Applicatorfuls vaginally once a week. Estradiol  0.02% vaginal cream.  Place 1/2 gram vaginally two to three times weekly at bedtime. Disp:  3 month supply)   promethazine  (PHENERGAN ) 25 MG tablet Take 25 mg by mouth every 6 (six) hours as needed for nausea or vomiting.   Sodium Fluoride 1.1 % PSTE Place 1 application onto teeth 2 (two) times daily.    temazepam (RESTORIL) 30 MG capsule Take 30 mg by mouth at bedtime.                Past Medical History:  Diagnosis Date   Anemia    Asthma    inhaler one x per day   Cancer [facility])    breast, left   Chiari malformation    Difficult intubation    cannot hyperextend neck due to Chiari malformation with tonsillar involvement, RA   Dysrhythmia 2015   history of PAC's   GERD (gastroesophageal reflux disease)     PONV (postoperative nausea and vomiting)    Rheumatoid arthritis (HCC)    on Humira        Objective:     wts  06/28/2023        125   11/18/2022       129   11/28/18 129 lb 3.2 oz (58.6 kg)  10/10/18 129 lb (58.5 kg)  08/22/18 128 lb 9.6 oz (58.3 kg)    Vital signs reviewed  06/28/2023  - Note at rest 02 sats  97% on RA   General appearance:    amb pleasant wf nad    HEENT : Oropharynx  clear/ no thrush       NECK :  without  apparent JVD/ palpable Nodes/TM    LUNGS: no acc muscle use,  Min barrel  contour chest wall with bilateral  slightly decreased bs s audible wheeze and  without cough on insp or exp maneuvers and min  Hyperresonant  to  percussion bilaterally    CV:  RRR  no s3 or murmur or increase in P2, and no edema   ABD:  soft and nontender     MS:  Nl gait/ ext warm without deformities Or obvious joint restrictions  calf tenderness, cyanosis or clubbing     SKIN: warm and dry without lesions    NEURO:  alert, approp, nl sensorium with  no motor or cerebellar deficits apparent.         Assessment

## 2023-06-28 ENCOUNTER — Ambulatory Visit (INDEPENDENT_AMBULATORY_CARE_PROVIDER_SITE_OTHER): Payer: 59 | Admitting: Internal Medicine

## 2023-06-28 ENCOUNTER — Encounter: Payer: Self-pay | Admitting: Internal Medicine

## 2023-06-28 VITALS — BP 110/70 | HR 76 | Temp 98.3°F | Ht 61.5 in | Wt 125.0 lb

## 2023-06-28 DIAGNOSIS — R0609 Other forms of dyspnea: Secondary | ICD-10-CM

## 2023-06-28 DIAGNOSIS — Z87891 Personal history of nicotine dependence: Secondary | ICD-10-CM

## 2023-06-28 DIAGNOSIS — R0602 Shortness of breath: Secondary | ICD-10-CM | POA: Diagnosis not present

## 2023-06-28 DIAGNOSIS — J449 Chronic obstructive pulmonary disease, unspecified: Secondary | ICD-10-CM

## 2023-06-28 HISTORY — DX: Chronic obstructive pulmonary disease, unspecified: J44.9

## 2023-06-28 MED ORDER — ALBUTEROL SULFATE HFA 108 (90 BASE) MCG/ACT IN AERS: 2.0000 | INHALATION_SPRAY | RESPIRATORY_TRACT | 11 refills | Status: AC | PRN

## 2023-06-28 NOTE — Patient Instructions (Signed)
 Symbicort  80 = Breyna  80   2 puffs first thing in and 12 hours later if needed   Only use your albuterol  as a rescue medication to be used if you can't catch your breath by resting or doing a relaxed purse lip breathing pattern.  - The less you use it, the better it will work when you need it. - Ok to use up to 2 puffs  every 4 hours if you must but call for immediate appointment if use goes up over your usual need - Don't leave home without it !!  (think of it like the spare tire for your car)   Also  Ok to try albuterol  15 min before an activity (on alternating days)  that you know would usually make you short of breath and see if it makes any difference and if makes none then don't take albuterol  after activity unless you can't catch your breath as this means it's the resting that helps, not the albuterol .      Please schedule a follow up visit in 12  months but call sooner if needed

## 2023-06-28 NOTE — Assessment & Plan Note (Addendum)
 Onset doe winter 2019 typically pc but also MMRC=1 assoc with voice fatgue - 10/10/2018  try symb 80 2bid as dulera  100 not on her plan  - 11/28/2018   [facility] RA  2 laps @  approx 264ft each @ moderate pace  stopped due to  End of study, no sob with sats 94%  - 11/28/2018  reduce sym 80 to 1 bid to see if any change hoarsness, sob, saba need - 11/18/2022  After extensive coaching inhaler device,  effectiveness =    90% so advair 45 reduce to 2 each am with pm doses prn flares or any requirement for saba  - 11/18/2022   Walked on RA  x  3  lap(s) =  approx 750  ft  @ brisk pace, stopped due to end of study, talking continuously s sob  with lowest 02 sats 95%   - PFT's  03/25/23  FEV1 1.39 (74 % ) ratio 0.66  p 20 % improvement from saba p 0 prior to study with DLCO  16.8 (94%)   and FV curve mildly concave  - 06/21/23 started on symbicort  80 2 each am and 2 in pm is prn and approp saba   Re SABA :  I spent extra time with pt today reviewing appropriate use of albuterol  for prn use on exertion with the following points: 1) saba is for relief of sob that does not improve by walking a slower pace or resting but rather if the pt does not improve after trying this first. 2) If the pt is convinced, as many are, that saba helps recover from activity faster then it's easy to tell if this is the case by re-challenging : ie stop, take the inhaler, then p 5 minutes try the exact same activity (intensity of workload) that just caused the symptoms and see if they are substantially diminished or not after saba 3) if there is an activity that reproducibly causes the symptoms, try the saba 15 min before the activity on alternate days   If in fact the saba really does help, then fine to continue to use it prn but advised may need to look closer at the maintenance regimen being used to achieve better control of airways disease with exertion.   F/u can be yearly,sooner prn    Each maintenance medication was reviewed in  detail including emphasizing most importantly the difference between maintenance and prns and under what circumstances the prns are to be triggered using an action plan format where appropriate.  Total time for H and P, chart review, counseling, reviewing hfa  device(s) and generating customized AVS unique to this office visit / same day charting = 31 min summary f/u ov

## 2023-07-07 ENCOUNTER — Other Ambulatory Visit: Payer: Self-pay

## 2023-07-07 DIAGNOSIS — M0519 Rheumatoid lung disease with rheumatoid arthritis of multiple sites: Secondary | ICD-10-CM | POA: Diagnosis not present

## 2023-07-07 DIAGNOSIS — M0579 Rheumatoid arthritis with rheumatoid factor of multiple sites without organ or systems involvement: Secondary | ICD-10-CM

## 2023-07-08 LAB — CBC WITH DIFFERENTIAL/PLATELET
Basophils Absolute: 0 10*3/uL (ref 0.0–0.2)
Basos: 1 %
EOS (ABSOLUTE): 0.6 10*3/uL — ABNORMAL HIGH (ref 0.0–0.4)
Eos: 7 %
Hematocrit: 41.6 % (ref 34.0–46.6)
Hemoglobin: 13.7 g/dL (ref 11.1–15.9)
Immature Grans (Abs): 0 10*3/uL (ref 0.0–0.1)
Immature Granulocytes: 0 %
Lymphocytes Absolute: 2.2 10*3/uL (ref 0.7–3.1)
Lymphs: 26 %
MCH: 28.8 pg (ref 26.6–33.0)
MCHC: 32.9 g/dL (ref 31.5–35.7)
MCV: 88 fL (ref 79–97)
Monocytes Absolute: 0.6 10*3/uL (ref 0.1–0.9)
Monocytes: 8 %
Neutrophils Absolute: 4.9 10*3/uL (ref 1.4–7.0)
Neutrophils: 58 %
Platelets: 360 10*3/uL (ref 150–450)
RBC: 4.75 x10E6/uL (ref 3.77–5.28)
RDW: 13 % (ref 11.7–15.4)
WBC: 8.3 10*3/uL (ref 3.4–10.8)

## 2023-07-08 LAB — COMPREHENSIVE METABOLIC PANEL WITH GFR
ALT: 24 IU/L (ref 0–32)
AST: 52 IU/L — ABNORMAL HIGH (ref 0–40)
Albumin: 4.4 g/dL (ref 3.8–4.8)
Alkaline Phosphatase: 74 IU/L (ref 44–121)
BUN/Creatinine Ratio: 25 (ref 12–28)
BUN: 15 mg/dL (ref 8–27)
Bilirubin Total: 0.4 mg/dL (ref 0.0–1.2)
CO2: 16 mmol/L — ABNORMAL LOW (ref 20–29)
Calcium: 9.3 mg/dL (ref 8.7–10.3)
Chloride: 101 mmol/L (ref 96–106)
Creatinine, Ser: 0.61 mg/dL (ref 0.57–1.00)
Globulin, Total: 2.5 g/dL (ref 1.5–4.5)
Sodium: 135 mmol/L (ref 134–144)
Total Protein: 6.9 g/dL (ref 6.0–8.5)
eGFR: 96 mL/min/{1.73_m2} (ref >59)

## 2023-07-09 ENCOUNTER — Ambulatory Visit: Payer: Self-pay | Admitting: Neurology

## 2023-07-12 ENCOUNTER — Other Ambulatory Visit: Payer: Self-pay | Admitting: *Deleted

## 2023-07-12 DIAGNOSIS — Z79899 Other long term (current) drug therapy: Secondary | ICD-10-CM

## 2023-07-12 DIAGNOSIS — M0579 Rheumatoid arthritis with rheumatoid factor of multiple sites without organ or systems involvement: Secondary | ICD-10-CM

## 2023-07-12 NOTE — Telephone Encounter (Signed)
-----   Message from Jorie Newness sent at 07/11/2023  6:48 PM EDT ----- Please let her know that the lab work is okay.  One of the liver labs was slightly high but not high enough to be concerned about.  We should check the CMP and CBC again in about 2 months

## 2023-07-12 NOTE — Telephone Encounter (Signed)
 I called and spoke w/ pt. Relayed results per Dr. Thom Fleeting note. She verbalized understanding. Her next infusion is scheduled for 09/08/23.  She will plan to do repeat labs then. Future orders placed in epic  She would like copy of lab results mailed to her. Confirmed address on file. Sent to Debra/medical records to help with this request.

## 2023-08-02 ENCOUNTER — Ambulatory Visit: Payer: 59 | Admitting: Cardiology

## 2023-08-04 DIAGNOSIS — K649 Unspecified hemorrhoids: Secondary | ICD-10-CM | POA: Diagnosis not present

## 2023-08-04 DIAGNOSIS — M810 Age-related osteoporosis without current pathological fracture: Secondary | ICD-10-CM | POA: Diagnosis not present

## 2023-08-04 DIAGNOSIS — J45909 Unspecified asthma, uncomplicated: Secondary | ICD-10-CM | POA: Diagnosis not present

## 2023-08-04 DIAGNOSIS — E7800 Pure hypercholesterolemia, unspecified: Secondary | ICD-10-CM | POA: Diagnosis not present

## 2023-08-04 DIAGNOSIS — Z79899 Other long term (current) drug therapy: Secondary | ICD-10-CM | POA: Diagnosis not present

## 2023-08-04 DIAGNOSIS — G935 Compression of brain: Secondary | ICD-10-CM | POA: Diagnosis not present

## 2023-08-04 DIAGNOSIS — M069 Rheumatoid arthritis, unspecified: Secondary | ICD-10-CM | POA: Diagnosis not present

## 2023-08-04 DIAGNOSIS — I700 Atherosclerosis of aorta: Secondary | ICD-10-CM | POA: Diagnosis not present

## 2023-08-04 DIAGNOSIS — K219 Gastro-esophageal reflux disease without esophagitis: Secondary | ICD-10-CM | POA: Diagnosis not present

## 2023-08-04 DIAGNOSIS — I361 Nonrheumatic tricuspid (valve) insufficiency: Secondary | ICD-10-CM | POA: Diagnosis not present

## 2023-08-04 DIAGNOSIS — G4701 Insomnia due to medical condition: Secondary | ICD-10-CM | POA: Diagnosis not present

## 2023-08-04 DIAGNOSIS — Z853 Personal history of malignant neoplasm of breast: Secondary | ICD-10-CM | POA: Diagnosis not present

## 2023-08-05 DIAGNOSIS — R102 Pelvic and perineal pain: Secondary | ICD-10-CM | POA: Diagnosis not present

## 2023-08-05 DIAGNOSIS — M62838 Other muscle spasm: Secondary | ICD-10-CM | POA: Diagnosis not present

## 2023-08-05 DIAGNOSIS — N3946 Mixed incontinence: Secondary | ICD-10-CM | POA: Diagnosis not present

## 2023-08-05 DIAGNOSIS — M6289 Other specified disorders of muscle: Secondary | ICD-10-CM | POA: Diagnosis not present

## 2023-08-05 DIAGNOSIS — M6281 Muscle weakness (generalized): Secondary | ICD-10-CM | POA: Diagnosis not present

## 2023-08-05 DIAGNOSIS — L910 Hypertrophic scar: Secondary | ICD-10-CM | POA: Diagnosis not present

## 2023-08-08 DIAGNOSIS — L910 Hypertrophic scar: Secondary | ICD-10-CM | POA: Diagnosis not present

## 2023-08-08 DIAGNOSIS — M62838 Other muscle spasm: Secondary | ICD-10-CM | POA: Diagnosis not present

## 2023-08-08 DIAGNOSIS — R102 Pelvic and perineal pain: Secondary | ICD-10-CM | POA: Diagnosis not present

## 2023-08-08 DIAGNOSIS — M6289 Other specified disorders of muscle: Secondary | ICD-10-CM | POA: Diagnosis not present

## 2023-08-08 DIAGNOSIS — N3946 Mixed incontinence: Secondary | ICD-10-CM | POA: Diagnosis not present

## 2023-08-08 DIAGNOSIS — M0519 Rheumatoid lung disease with rheumatoid arthritis of multiple sites: Secondary | ICD-10-CM | POA: Diagnosis not present

## 2023-08-08 DIAGNOSIS — M6281 Muscle weakness (generalized): Secondary | ICD-10-CM | POA: Diagnosis not present

## 2023-08-09 NOTE — Progress Notes (Unsigned)
 Office Visit Note  [name]             Date of Birth: December 28, 1950           MRN: [id number]             PCP: Vernon Velna SAUNDERS, MD Referring: Vernon Velna SAUNDERS, MD Visit Date: 08/23/2023 Occupation: @GUAROCC @  Subjective:  Medication monitoring  History of Present Illness: Kristy Perez is a 73 y.o. female with history of seropositive rheumatoid arthritis and DDD.  Patient remains on IV orencia  infusions once monthly.  She is tolerating Orencia  without any side effects and has not had any recent gaps in therapy.  She denies any recent or recurrent infections.  She denies any signs or symptoms of a rheumatoid arthritis flare.  She has not been experiencing any morning stiffness, nocturnal pain, or difficulty performing ADLs.  She has been sanding her floors without difficulty and tries to remain active weight lifting and walking for exercise. Patient states that she had pulmonary function testing performed on 03/25/2023 and had a follow-up visit scheduled with Dr. Darlean on 06/28/2023.  Patient states that she has been diagnosed with COPD.  She has not had any recent hospitalizations or acute exacerbations.  She denies any wheezing at this time.  She has been using Breyna  as prescribed.   Activities of Daily Living:  Patient denies any morning stiffness  Patient Denies nocturnal pain.  Difficulty dressing/grooming: Denies Difficulty climbing stairs: Reports Difficulty getting out of chair: Denies Difficulty using hands for taps, buttons, cutlery, and/or writing: Denies  Review of Systems  Constitutional:  Negative for fatigue.  HENT:  Positive for mouth dryness. Negative for mouth sores.   Eyes:  Positive for dryness.  Respiratory:  Positive for shortness of breath.   Cardiovascular:  Positive for palpitations. Negative for chest pain.  Gastrointestinal:  Negative for blood in stool, constipation and diarrhea.  Endocrine: Negative for increased urination.  Genitourinary:   Negative for involuntary urination.  Musculoskeletal:  Positive for joint pain and joint pain. Negative for gait problem, joint swelling, myalgias, muscle weakness, muscle tenderness and myalgias.  Skin:  Negative for color change, rash, hair loss and sensitivity to sunlight.  Allergic/Immunologic: Negative for susceptible to infections.  Neurological:  Negative for dizziness and headaches.  Hematological:  Negative for swollen glands.  Psychiatric/Behavioral:  Negative for depressed mood and sleep disturbance. The patient is not nervous/anxious.     PMFS History:  Patient Active Problem List   Diagnosis Date Noted   Numbness 03/31/2020   Mononeuropathy multiplex syndrome 03/31/2020   Neuritis of right ulnar nerve 03/31/2020   Polyneuropathy 03/31/2020   Hoarseness with classic voice fatigue 10/10/2018   S/P lumbar laminectomy 04/06/2018   Radiculopathy due to lumbar intervertebral disc disorder 12/07/2017   Congenital spondylolysis 12/07/2017   Spondylolisthesis of lumbar region 12/07/2017   DDD (degenerative disc disease), lumbar 12/08/2016   High risk medication use 03/15/2016   DJD (degenerative joint disease), cervical 03/15/2016   History of asthma 03/15/2016   History of squamous cell carcinoma 03/15/2016   History of basal cell carcinoma 03/15/2016   History of breast cancer 03/15/2016   History of Chiari malformation 03/15/2016   Vitamin D  deficiency 03/15/2016   Rheumatoid arthritis with rheumatoid factor of multiple sites without organ or systems involvement (HCC) 10/20/2015   Esophageal reflux 05/01/2015   Anxiety 05/01/2015   Sjogrens syndrome (HCC) 05/01/2015   COPD GOLD 2 05/01/2015   Deviated nasal septum  05/24/2014    Class: Chronic   Insomnia 11/25/2007   DOE (dyspnea on exertion) 11/15/2007    Past Medical History:  Diagnosis Date   Anemia    Asthma    inhaler one x per day   Breast cancer (HCC)    Cancer (HCC)    breast, left   Chiari malformation     COPD (chronic obstructive pulmonary disease) (HCC) 06/28/2023   Difficult intubation    cannot hyperextend neck due to Chiari malformation with tonsillar involvement, RA   Dysrhythmia 2015   history of PAC's   GERD (gastroesophageal reflux disease)    High cholesterol    dx by PCP per patient    Personal history of radiation therapy    PONV (postoperative nausea and vomiting)    Rheumatoid arthritis (HCC)    on Humira   RSV (respiratory syncytial virus infection) 09/08/2022    Family History  Problem Relation Age of Onset   Diabetes Mother    Heart failure Mother    Bladder Cancer Mother    Stroke Mother    Cancer Father        head and neck   Uterine cancer Maternal Aunt    Bladder Cancer Maternal Grandmother    Colon cancer Maternal Grandmother    Breast cancer Cousin    Past Surgical History:  Procedure Laterality Date   BREAST SURGERY Left 2003   Lumpectomy T1C1, NO and negative ER/ PR   COLONOSCOPY  10/04   COLONOSCOPY  11/10   normal recheck in 5 years   COLPORRHAPHY     ESOPHAGOGASTRODUODENOSCOPY ENDOSCOPY  11/10   GERD   FOOT SURGERY Left age 4   removal of pseudo rheumatoid nodules from left forefoot.   LAPAROSCOPIC BILATERAL SALPINGO OOPHERECTOMY N/A 07/30/2013   Procedure: LAPAROSCOPIC BILATERAL SALPINGO OOPHORECTOMY WITH WASHINGS;  Surgeon: Ronal Elvie Pinal, MD;  Location: [facility];  Service: Gynecology;  Laterality: N/A;   LUMBAR LAMINECTOMY/DECOMPRESSION MICRODISCECTOMY Right 04/06/2018   Procedure: Extraforaminal Microdiscectomy - Lumbar Two-Lumbar Three - right;  Surgeon: Joshua Alm RAMAN, MD;  Location: [facility] OR;  Service: Neurosurgery;  Laterality: Right;  Extraforaminal Microdiscectomy - Lumbar Two-Lumbar Three - right   NASAL SEPTOPLASTY W/ TURBINOPLASTY Bilateral 05/24/2014   Procedure: NASAL SEPTOPLASTY WITH  BILATERAL TURBINATE REDUCTION;  Surgeon: Alm Bouche, MD;  Location: [facility] OR;  Service: ENT;  Laterality: Bilateral;   PELVIC FLOOR REPAIR  3/04    SPARC with AP colporrhaphy   PILONIDAL CYST EXCISION     TOTAL VAGINAL HYSTERECTOMY  1989   with AP repair   Social History   Social History Narrative   Lives alone in a 2 story home. Has 2 children.  Works as a Catering manager.  Education: college.    Immunization History  Administered Date(s) Administered   Fluad Quad(high Dose 65+) 10/14/2018   Influenza, High Dose Seasonal PF 10/26/2017   Influenza,inj,Quad PF,6+ Mos 10/18/2016   Influenza-Unspecified 10/08/2014, 10/24/2015   Janssen (J&J) SARS-COV-2 Vaccination 05/01/2019   PFIZER(Purple Top)SARS-COV-2 Vaccination 02/15/2020, 09/03/2020   Pneumococcal-Unspecified 10/18/2007   Tdap 11/24/2008, 05/04/2021   Zoster Recombinant(Shingrix) 12/25/2016     Objective: Vital Signs: BP (!) 97/58 (BP Location: Left Arm, Patient Position: Sitting, Cuff Size: Normal)   Pulse 66   Resp 16   Ht 5' 1.5 (1.562 m)   Wt 125 lb 9.6 oz (57 kg)   LMP 02/15/1985 (Approximate)   BMI 23.35 kg/m    Physical Exam Vitals and nursing note reviewed.  Constitutional:  Appearance: She is well-developed.  HENT:     Head: Normocephalic and atraumatic.  Eyes:     Conjunctiva/sclera: Conjunctivae normal.  Cardiovascular:     Rate and Rhythm: Normal rate and regular rhythm.     Heart sounds: Normal heart sounds.  Pulmonary:     Effort: Pulmonary effort is normal.     Breath sounds: Normal breath sounds.  Abdominal:     General: Bowel sounds are normal.     Palpations: Abdomen is soft.  Musculoskeletal:     Cervical back: Normal range of motion.  Lymphadenopathy:     Cervical: No cervical adenopathy.  Skin:    General: Skin is warm and dry.     Capillary Refill: Capillary refill takes less than 2 seconds.  Neurological:     Mental Status: She is alert and oriented to person, place, and time.  Psychiatric:        Behavior: Behavior normal.      Musculoskeletal Exam: C-spine has limited range of motion with lateral rotation.  Shoulder  joints and elbow joints have good ROM.  Ulnar deviation bilaterally, right worse than left.  Synovial thickening of MCP joints but no active synovitis.  CMC, PIP, DIP thickening consistent with osteoarthritis of both hands.  Hip joints have good range of motion with no groin pain.  Knee joints have good range of motion no warmth or effusion.  Ankle joints have good range of motion with no tenderness or joint swelling.  CDAI Exam: CDAI Score: -- Patient Global: --; Provider Global: -- Swollen: --; Tender: -- Joint Exam 08/23/2023   No joint exam has been documented for this visit   There is currently no information documented on the homunculus. Go to the Rheumatology activity and complete the homunculus joint exam.  Investigation: No additional findings.  Imaging: No results found.  Recent Labs: Lab Results  Component Value Date   WBC 8.3 07/07/2023   HGB 13.7 07/07/2023   PLT 360 07/07/2023   NA 135 07/07/2023   K CANCELED 07/07/2023   CL 101 07/07/2023   CO2 16 (L) 07/07/2023   GLUCOSE CANCELED 07/07/2023   BUN 15 07/07/2023   CREATININE 0.61 07/07/2023   BILITOT 0.4 07/07/2023   ALKPHOS 74 07/07/2023   AST 52 (H) 07/07/2023   ALT 24 07/07/2023   PROT 6.9 07/07/2023   ALBUMIN 4.4 07/07/2023   CALCIUM  9.3 07/07/2023   GFRAA 114 03/31/2020   QFTBGOLD Negative 06/15/2016   QFTBGOLDPLUS NEGATIVE 04/22/2022    Speciality Comments: Orencia  IV 500mg  every 4 weeks, patient infuses at [facility] Tb gold negative 06/15/16 BONE DENSITY [facility]  Procedures:  No procedures performed Allergies: Fish allergy, Iodine, Boniva [ibandronate], Crestor [rosuvastatin], Fosamax [alendronate], Paxlovid [nirmatrelvir-ritonavir], and Sulfonamide derivatives     Assessment / Plan:     Visit Diagnoses: Rheumatoid arthritis with rheumatoid factor of multiple sites without organ or systems involvement (HCC) - +RF, +CCP: She has no joint tenderness or synovitis on examination today.  She  has not had any signs or symptoms of a rheumatoid arthritis flare.  She has clinically been doing well on IV Orencia  500 mg infusions every 4 weeks.  She has been tolerating Orencia  without any side effects.  She has not had any morning stiffness, nocturnal pain, or difficulty performing ADLs.  She has been able to remain active without flaring. Of note the patient has been officially diagnosed with COPD by Dr. Darlean.  No recent acute exacerbations or hospitalizations.  Her  symptoms have been well-controlled on Symbicort .  Discussed the increased risk for COPD exacerbations in patient's on Orencia .  Plan to monitor symptoms closely-patient has been made aware of this risk.  Do not plan to make any medication changes at this time since her rheumatoid arthritis has been significantly well-controlled.  She is not a good candidate for TNF inhibitors given history of squamous cell and basal cell carcinomas. She was advised to notify us  if she develops signs or symptoms of a flare.  She will follow-up in the office in 5 months or sooner if needed.  High risk medication use - Orencia  IV infusion 500 mg every 28 days at [facility].  (inadequate response to Enbrel and Humira).  CBC and CMP updated on 07/07/23.   TB gold negative on 04/22/22.  Order for TB gold released today. No recent or recurrent infections.  Discussed the importance of holding orencia  if she develops signs or symptoms of an infection and to resume once the infection has completely cleared.   - Plan: QuantiFERON-TB Gold Plus  Screening for tuberculosis -Order for TB gold released today.  Plan: QuantiFERON-TB Gold Plus  Trochanteric bursitis of right hip: Not currently symptomatic.   DDD (degenerative disc disease), cervical - MRI C-spine 04/03/2020. No symptoms of radiculopathy.   Degeneration of intervertebral disc of lumbar region without discogenic back pain or lower extremity pain: Not currently symptomatic.  No symptoms of radiculopathy.    Shortness of breath - Under the care of Dr. Darlean.  Chest x-ray 10/06/2022-no active cardiopulmonary disease.  No wheezing or crackles noted today.  Reviewed Dr. Chari office visit note from 06/28/23: GOLD 2 COPD/AB-- maintained on symbicort  80 2 puffs BID.  Discussed the concern for worsening COPD in patient's on Orencia .  No recent exacerbations or hospitalizations.  No recent or recurrent infections.  Lateral epicondylitis of both elbows: Not currently symptomatic.    Age-related osteoporosis without current pathological fracture - DEXA 02/12/2019: Right femoral neck BMD 0.552 with T score -2.7.  DEXA updated on 08/25/21: The BMD RFN is 0.708 g/cm2 with a T-score of -2.4.    She is taking a vitamin D  supplement daily.   Order for DEXA placed today.  - Plan: DG BONE DENSITY (DXA)  Vitamin D  deficiency: She is taking a vitamin D  supplement daily.   Other medical conditions are listed as follows:   Primary insomnia  History of asthma  History of Chiari malformation  History of squamous cell carcinoma  History of basal cell carcinoma  Gastroesophageal reflux disease without esophagitis  History of breast cancer   Orders: Orders Placed This Encounter  Procedures   DG BONE DENSITY (DXA)   QuantiFERON-TB Gold Plus   No orders of the defined types were placed in this encounter.   Follow-Up Instructions: Return in about 5 months (around 01/23/2024) for Rheumatoid arthritis, DDD.   Waddell CHRISTELLA Craze, PA-C  Note - This record has been created using Dragon software.  Chart creation errors have been sought, but may not always  have been located. Such creation errors do not reflect on  the standard of medical care.

## 2023-08-23 ENCOUNTER — Ambulatory Visit: Payer: 59 | Attending: Physician Assistant | Admitting: Physician Assistant

## 2023-08-23 ENCOUNTER — Encounter: Payer: Self-pay | Admitting: Physician Assistant

## 2023-08-23 VITALS — BP 97/58 | HR 66 | Resp 16 | Ht 61.5 in | Wt 125.6 lb

## 2023-08-23 DIAGNOSIS — Z85828 Personal history of other malignant neoplasm of skin: Secondary | ICD-10-CM | POA: Diagnosis not present

## 2023-08-23 DIAGNOSIS — Z8589 Personal history of malignant neoplasm of other organs and systems: Secondary | ICD-10-CM | POA: Insufficient documentation

## 2023-08-23 DIAGNOSIS — K219 Gastro-esophageal reflux disease without esophagitis: Secondary | ICD-10-CM | POA: Insufficient documentation

## 2023-08-23 DIAGNOSIS — Z111 Encounter for screening for respiratory tuberculosis: Secondary | ICD-10-CM | POA: Insufficient documentation

## 2023-08-23 DIAGNOSIS — Z8709 Personal history of other diseases of the respiratory system: Secondary | ICD-10-CM | POA: Diagnosis not present

## 2023-08-23 DIAGNOSIS — Z79899 Other long term (current) drug therapy: Secondary | ICD-10-CM | POA: Insufficient documentation

## 2023-08-23 DIAGNOSIS — R0602 Shortness of breath: Secondary | ICD-10-CM | POA: Diagnosis not present

## 2023-08-23 DIAGNOSIS — M7061 Trochanteric bursitis, right hip: Secondary | ICD-10-CM | POA: Diagnosis not present

## 2023-08-23 DIAGNOSIS — F5101 Primary insomnia: Secondary | ICD-10-CM | POA: Diagnosis not present

## 2023-08-23 DIAGNOSIS — M51369 Other intervertebral disc degeneration, lumbar region without mention of lumbar back pain or lower extremity pain: Secondary | ICD-10-CM | POA: Diagnosis not present

## 2023-08-23 DIAGNOSIS — E559 Vitamin D deficiency, unspecified: Secondary | ICD-10-CM | POA: Insufficient documentation

## 2023-08-23 DIAGNOSIS — Z8669 Personal history of other diseases of the nervous system and sense organs: Secondary | ICD-10-CM | POA: Diagnosis not present

## 2023-08-23 DIAGNOSIS — M0579 Rheumatoid arthritis with rheumatoid factor of multiple sites without organ or systems involvement: Secondary | ICD-10-CM | POA: Insufficient documentation

## 2023-08-23 DIAGNOSIS — Z853 Personal history of malignant neoplasm of breast: Secondary | ICD-10-CM | POA: Diagnosis not present

## 2023-08-23 DIAGNOSIS — M810 Age-related osteoporosis without current pathological fracture: Secondary | ICD-10-CM | POA: Insufficient documentation

## 2023-08-23 DIAGNOSIS — M7712 Lateral epicondylitis, left elbow: Secondary | ICD-10-CM | POA: Diagnosis not present

## 2023-08-23 DIAGNOSIS — M7711 Lateral epicondylitis, right elbow: Secondary | ICD-10-CM | POA: Diagnosis not present

## 2023-08-23 DIAGNOSIS — M5030 Other cervical disc degeneration, unspecified cervical region: Secondary | ICD-10-CM | POA: Diagnosis not present

## 2023-08-24 ENCOUNTER — Encounter: Payer: Self-pay | Admitting: Physician Assistant

## 2023-08-24 DIAGNOSIS — R102 Pelvic and perineal pain: Secondary | ICD-10-CM | POA: Diagnosis not present

## 2023-08-24 DIAGNOSIS — M6281 Muscle weakness (generalized): Secondary | ICD-10-CM | POA: Diagnosis not present

## 2023-08-24 DIAGNOSIS — N3946 Mixed incontinence: Secondary | ICD-10-CM | POA: Diagnosis not present

## 2023-08-24 DIAGNOSIS — M62838 Other muscle spasm: Secondary | ICD-10-CM | POA: Diagnosis not present

## 2023-08-24 DIAGNOSIS — L910 Hypertrophic scar: Secondary | ICD-10-CM | POA: Diagnosis not present

## 2023-08-24 DIAGNOSIS — M6289 Other specified disorders of muscle: Secondary | ICD-10-CM | POA: Diagnosis not present

## 2023-08-25 ENCOUNTER — Ambulatory Visit: Payer: Self-pay | Admitting: Physician Assistant

## 2023-08-25 LAB — QUANTIFERON-TB GOLD PLUS
Mitogen-NIL: >10 [IU]/mL
NIL: 0.04 [IU]/mL
QuantiFERON-TB Gold Plus: NEGATIVE
TB1-NIL: 0 [IU]/mL
TB2-NIL: 0 [IU]/mL

## 2023-08-25 NOTE — Progress Notes (Signed)
 TB gold negative

## 2023-09-01 DIAGNOSIS — M62838 Other muscle spasm: Secondary | ICD-10-CM | POA: Diagnosis not present

## 2023-09-01 DIAGNOSIS — L910 Hypertrophic scar: Secondary | ICD-10-CM | POA: Diagnosis not present

## 2023-09-01 DIAGNOSIS — R102 Pelvic and perineal pain: Secondary | ICD-10-CM | POA: Diagnosis not present

## 2023-09-01 DIAGNOSIS — N3946 Mixed incontinence: Secondary | ICD-10-CM | POA: Diagnosis not present

## 2023-09-01 DIAGNOSIS — M6289 Other specified disorders of muscle: Secondary | ICD-10-CM | POA: Diagnosis not present

## 2023-09-01 DIAGNOSIS — M6281 Muscle weakness (generalized): Secondary | ICD-10-CM | POA: Diagnosis not present

## 2023-09-08 ENCOUNTER — Other Ambulatory Visit (INDEPENDENT_AMBULATORY_CARE_PROVIDER_SITE_OTHER): Payer: Self-pay

## 2023-09-08 DIAGNOSIS — M0579 Rheumatoid arthritis with rheumatoid factor of multiple sites without organ or systems involvement: Secondary | ICD-10-CM | POA: Diagnosis not present

## 2023-09-08 DIAGNOSIS — M0519 Rheumatoid lung disease with rheumatoid arthritis of multiple sites: Secondary | ICD-10-CM | POA: Diagnosis not present

## 2023-09-08 DIAGNOSIS — Z79899 Other long term (current) drug therapy: Secondary | ICD-10-CM | POA: Diagnosis not present

## 2023-09-08 DIAGNOSIS — Z0289 Encounter for other administrative examinations: Secondary | ICD-10-CM

## 2023-09-09 ENCOUNTER — Ambulatory Visit: Payer: Self-pay | Admitting: Neurology

## 2023-09-09 LAB — CBC WITH DIFFERENTIAL/PLATELET
Basophils Absolute: 0 x10E3/uL (ref 0.0–0.2)
Basos: 1 %
EOS (ABSOLUTE): 0.4 x10E3/uL (ref 0.0–0.4)
Eos: 5 %
Hematocrit: 39.2 % (ref 34.0–46.6)
Hemoglobin: 12.7 g/dL (ref 11.1–15.9)
Immature Grans (Abs): 0 x10E3/uL (ref 0.0–0.1)
Immature Granulocytes: 0 %
Lymphocytes Absolute: 1.9 x10E3/uL (ref 0.7–3.1)
Lymphs: 28 %
MCH: 28.3 pg (ref 26.6–33.0)
MCHC: 32.4 g/dL (ref 31.5–35.7)
MCV: 87 fL (ref 79–97)
Monocytes Absolute: 0.6 x10E3/uL (ref 0.1–0.9)
Monocytes: 8 %
Neutrophils Absolute: 4 x10E3/uL (ref 1.4–7.0)
Neutrophils: 58 %
Platelets: 370 x10E3/uL (ref 150–450)
RBC: 4.49 x10E6/uL (ref 3.77–5.28)
RDW: 12.9 % (ref 11.7–15.4)
WBC: 6.9 x10E3/uL (ref 3.4–10.8)

## 2023-09-09 LAB — COMPREHENSIVE METABOLIC PANEL WITH GFR
ALT: 19 IU/L (ref 0–32)
AST: 25 IU/L (ref 0–40)
Albumin: 4.2 g/dL (ref 3.8–4.8)
Alkaline Phosphatase: 82 IU/L (ref 44–121)
BUN/Creatinine Ratio: 20 (ref 12–28)
BUN: 12 mg/dL (ref 8–27)
Bilirubin Total: 0.5 mg/dL (ref 0.0–1.2)
CO2: 18 mmol/L — ABNORMAL LOW (ref 20–29)
Calcium: 9.3 mg/dL (ref 8.7–10.3)
Chloride: 102 mmol/L (ref 96–106)
Creatinine, Ser: 0.61 mg/dL (ref 0.57–1.00)
Globulin, Total: 2 g/dL (ref 1.5–4.5)
Glucose: 91 mg/dL (ref 70–99)
Potassium: 4.2 mmol/L (ref 3.5–5.2)
Sodium: 138 mmol/L (ref 134–144)
Total Protein: 6.2 g/dL (ref 6.0–8.5)
eGFR: 96 mL/min/1.73 (ref >59)

## 2023-09-15 DIAGNOSIS — M62838 Other muscle spasm: Secondary | ICD-10-CM | POA: Diagnosis not present

## 2023-09-15 DIAGNOSIS — M6289 Other specified disorders of muscle: Secondary | ICD-10-CM | POA: Diagnosis not present

## 2023-09-15 DIAGNOSIS — L910 Hypertrophic scar: Secondary | ICD-10-CM | POA: Diagnosis not present

## 2023-09-15 DIAGNOSIS — N3946 Mixed incontinence: Secondary | ICD-10-CM | POA: Diagnosis not present

## 2023-09-15 DIAGNOSIS — R102 Pelvic and perineal pain: Secondary | ICD-10-CM | POA: Diagnosis not present

## 2023-09-15 DIAGNOSIS — M6281 Muscle weakness (generalized): Secondary | ICD-10-CM | POA: Diagnosis not present

## 2023-10-04 DIAGNOSIS — M6281 Muscle weakness (generalized): Secondary | ICD-10-CM | POA: Diagnosis not present

## 2023-10-04 DIAGNOSIS — L910 Hypertrophic scar: Secondary | ICD-10-CM | POA: Diagnosis not present

## 2023-10-04 DIAGNOSIS — N3946 Mixed incontinence: Secondary | ICD-10-CM | POA: Diagnosis not present

## 2023-10-04 DIAGNOSIS — M6289 Other specified disorders of muscle: Secondary | ICD-10-CM | POA: Diagnosis not present

## 2023-10-04 DIAGNOSIS — M62838 Other muscle spasm: Secondary | ICD-10-CM | POA: Diagnosis not present

## 2023-10-04 DIAGNOSIS — R102 Pelvic and perineal pain: Secondary | ICD-10-CM | POA: Diagnosis not present

## 2023-10-06 DIAGNOSIS — M0519 Rheumatoid lung disease with rheumatoid arthritis of multiple sites: Secondary | ICD-10-CM | POA: Diagnosis not present

## 2023-10-13 DIAGNOSIS — M6281 Muscle weakness (generalized): Secondary | ICD-10-CM | POA: Diagnosis not present

## 2023-10-13 DIAGNOSIS — M62838 Other muscle spasm: Secondary | ICD-10-CM | POA: Diagnosis not present

## 2023-10-13 DIAGNOSIS — R102 Pelvic and perineal pain: Secondary | ICD-10-CM | POA: Diagnosis not present

## 2023-10-13 DIAGNOSIS — L910 Hypertrophic scar: Secondary | ICD-10-CM | POA: Diagnosis not present

## 2023-10-13 DIAGNOSIS — N3946 Mixed incontinence: Secondary | ICD-10-CM | POA: Diagnosis not present

## 2023-10-13 DIAGNOSIS — M6289 Other specified disorders of muscle: Secondary | ICD-10-CM | POA: Diagnosis not present

## 2023-10-18 DIAGNOSIS — M810 Age-related osteoporosis without current pathological fracture: Secondary | ICD-10-CM | POA: Diagnosis not present

## 2023-10-18 LAB — HM DEXA SCAN

## 2023-10-21 ENCOUNTER — Telehealth: Payer: Self-pay

## 2023-10-21 NOTE — Telephone Encounter (Signed)
 Received DEXA results from [facility].  Date of Scan: 10/18/2023  Lowest T-score: -2.9  BMD: 0.529  Lowest site measured: Right Femoral Neck  DX: Osteoporosis  Significant changes in BMD and site measured (5% and above): AP Total Spine  Current Regimen: Vitamin D   Recommendation: Osteoporosis - Patient will require treatment-options can be discussed at visit.  Reviewed by: Cheryl Waddell HERO, PA-C  Next Appointment: 01/31/2024  Contacted the patient and advised the scan shows Osteoporosis and the results and treatment options will be discussed at her follow up visit. Patient verbalized understanding.

## 2023-10-27 ENCOUNTER — Encounter: Payer: Self-pay | Admitting: Physician Assistant

## 2023-11-02 DIAGNOSIS — M6289 Other specified disorders of muscle: Secondary | ICD-10-CM | POA: Diagnosis not present

## 2023-11-02 DIAGNOSIS — M6281 Muscle weakness (generalized): Secondary | ICD-10-CM | POA: Diagnosis not present

## 2023-11-02 DIAGNOSIS — L910 Hypertrophic scar: Secondary | ICD-10-CM | POA: Diagnosis not present

## 2023-11-02 DIAGNOSIS — M62838 Other muscle spasm: Secondary | ICD-10-CM | POA: Diagnosis not present

## 2023-11-02 DIAGNOSIS — N3946 Mixed incontinence: Secondary | ICD-10-CM | POA: Diagnosis not present

## 2023-11-03 ENCOUNTER — Other Ambulatory Visit

## 2023-11-03 ENCOUNTER — Other Ambulatory Visit: Payer: Self-pay | Admitting: *Deleted

## 2023-11-03 ENCOUNTER — Other Ambulatory Visit: Payer: Self-pay

## 2023-11-03 DIAGNOSIS — D3132 Benign neoplasm of left choroid: Secondary | ICD-10-CM | POA: Diagnosis not present

## 2023-11-03 DIAGNOSIS — H2513 Age-related nuclear cataract, bilateral: Secondary | ICD-10-CM | POA: Diagnosis not present

## 2023-11-03 DIAGNOSIS — Z79899 Other long term (current) drug therapy: Secondary | ICD-10-CM

## 2023-11-03 DIAGNOSIS — M0579 Rheumatoid arthritis with rheumatoid factor of multiple sites without organ or systems involvement: Secondary | ICD-10-CM

## 2023-11-03 DIAGNOSIS — M0519 Rheumatoid lung disease with rheumatoid arthritis of multiple sites: Secondary | ICD-10-CM | POA: Diagnosis not present

## 2023-11-03 DIAGNOSIS — H35363 Drusen (degenerative) of macula, bilateral: Secondary | ICD-10-CM | POA: Diagnosis not present

## 2023-11-03 DIAGNOSIS — H43813 Vitreous degeneration, bilateral: Secondary | ICD-10-CM | POA: Diagnosis not present

## 2023-11-04 LAB — COMPREHENSIVE METABOLIC PANEL WITH GFR
ALT: 21 IU/L (ref 0–32)
AST: 37 IU/L (ref 0–40)
Albumin: 4.4 g/dL (ref 3.8–4.8)
Alkaline Phosphatase: 77 IU/L (ref 49–135)
BUN/Creatinine Ratio: 28 (ref 12–28)
BUN: 15 mg/dL (ref 8–27)
Bilirubin Total: 0.5 mg/dL (ref 0.0–1.2)
CO2: 20 mmol/L (ref 20–29)
Calcium: 9.1 mg/dL (ref 8.7–10.3)
Chloride: 103 mmol/L (ref 96–106)
Creatinine, Ser: 0.54 mg/dL — ABNORMAL LOW (ref 0.57–1.00)
Globulin, Total: 2 g/dL (ref 1.5–4.5)
Glucose: 87 mg/dL (ref 70–99)
Sodium: 137 mmol/L (ref 134–144)
Total Protein: 6.4 g/dL (ref 6.0–8.5)
eGFR: 98 mL/min/1.73 (ref >59)

## 2023-11-04 LAB — CBC WITH DIFFERENTIAL/PLATELET
Basophils Absolute: 0 x10E3/uL (ref 0.0–0.2)
Basos: 0 %
EOS (ABSOLUTE): 0.3 x10E3/uL (ref 0.0–0.4)
Eos: 4 %
Hematocrit: 39.8 % (ref 34.0–46.6)
Hemoglobin: 12.9 g/dL (ref 11.1–15.9)
Immature Grans (Abs): 0 x10E3/uL (ref 0.0–0.1)
Immature Granulocytes: 0 %
Lymphocytes Absolute: 2.2 x10E3/uL (ref 0.7–3.1)
Lymphs: 28 %
MCH: 28.5 pg (ref 26.6–33.0)
MCHC: 32.4 g/dL (ref 31.5–35.7)
MCV: 88 fL (ref 79–97)
Monocytes Absolute: 0.8 x10E3/uL (ref 0.1–0.9)
Monocytes: 10 %
Neutrophils Absolute: 4.7 x10E3/uL (ref 1.4–7.0)
Neutrophils: 58 %
Platelets: 363 x10E3/uL (ref 150–450)
RBC: 4.53 x10E6/uL (ref 3.77–5.28)
RDW: 13 % (ref 11.7–15.4)
WBC: 8 x10E3/uL (ref 3.4–10.8)

## 2023-11-07 ENCOUNTER — Ambulatory Visit: Payer: Self-pay | Admitting: Neurology

## 2023-11-25 DIAGNOSIS — Z23 Encounter for immunization: Secondary | ICD-10-CM | POA: Diagnosis not present

## 2023-11-30 DIAGNOSIS — M62838 Other muscle spasm: Secondary | ICD-10-CM | POA: Diagnosis not present

## 2023-11-30 DIAGNOSIS — R102 Pelvic and perineal pain unspecified side: Secondary | ICD-10-CM | POA: Diagnosis not present

## 2023-11-30 DIAGNOSIS — M6281 Muscle weakness (generalized): Secondary | ICD-10-CM | POA: Diagnosis not present

## 2023-12-13 ENCOUNTER — Encounter: Payer: Self-pay | Admitting: Internal Medicine

## 2023-12-13 ENCOUNTER — Ambulatory Visit: Admitting: Internal Medicine

## 2023-12-13 VITALS — BP 108/70 | HR 73 | Ht 61.0 in | Wt 126.0 lb

## 2023-12-13 DIAGNOSIS — J449 Chronic obstructive pulmonary disease, unspecified: Secondary | ICD-10-CM

## 2023-12-13 DIAGNOSIS — Z87891 Personal history of nicotine dependence: Secondary | ICD-10-CM | POA: Diagnosis not present

## 2023-12-13 NOTE — Patient Instructions (Signed)
 To get the most out of exercise, you need to be continuously aware that you are short of breath, but never out of breath, for at least 30 minutes daily. As you improve, it will actually be easier for you to do the same amount of exercise  in  30 minutes so always push to the level where you are short of breath.    Make sure you check your oxygen saturation  AT  your highest level of activity (not after you stop)   to be sure it stays over 90% and adjust  02 flow upward to maintain this level if needed but remember to turn it back to previous settings when you stop (to conserve your supply).   Also  Ok to try albuterol  15 min before an activity (on alternating days)  that you know would usually make you short of breath and see if it makes any difference and if makes none then don't take albuterol  after activity unless you can't catch your breath as this means it's the resting that helps, not the albuterol .  Try breztri Take 2 puffs first thing in am and then another 2 puffs about 12 hours later.     Please schedule a follow up visit in 12  months but call sooner if needed

## 2023-12-13 NOTE — Progress Notes (Unsigned)
 Kristy Perez, female    DOB: 06-25-1950,     MRN: [id number]   Brief patient profile:  73  yowf quit smoking 2001 with hx of asthma as child outgrew age 73  onset feet aching mid 1990s eventually dx as RA and some AB on saba while smoking 2001 and  And AB apparently resolved completely off cigs for a few years but then started needing more saba so rx   dulera  100 just one puff daily   Which eliminated need for saba   and did fine until around winter 2019 sob pc every daily either lunch=supper not related to quantity or quality of food and  not better p saba so referred to pulmonary clinic 10/10/2018 by Dr   Dolphus.  Was on ppi until around 2015 and pepcid  and daily has sensation of hb worse since stopped ppi as worried about the cost and reported side effects.  - PFT's  03/25/23  FEV1 1.39 (74 % ) ratio 0.66  p 20 % improvement from saba p 0 prior to study with DLCO  16.8 (94%)   and FV curve mildly concave     History of Present Illness  10/10/2018  Pulmonary/ 1st office eval/Kristy Perez  Chief Complaint  Patient presents with   Pulmonary Consult    Referred by Dr. Dolphus for eval of SOB. [name].   Dyspnea:  MMRC1 = can walk nl pace, flat grade, can't hurry or go uphills or steps s sob   Cough: no Sleep: fine p temazepam/ one pillow on side bed flat no resp c/os SABA use: none now rec Plan A = Automatic = Start symbicort  80 Take 2 puffs first thing in am and then another 2 puffs about 12 hours later.  Work on maintaining optimal  inhaler technique: Plan B = Backup Only use your albuterol  inhaler as a rescue medication  Change pepcid  20 mg to take after bfast and after supper  for 4  weeks       11/28/2018  f/u ov/Kristy Perez re: RA, hoarseness, unexplained sob improved on symb Chief Complaint  Patient presents with   Follow-up    Breathing is overall doing well. She is using her proair  once daily on average.   Dyspnea:  Able to  walk up 4 miles flat ok,  One flight and sob panting x 3 years no change on symb 80 = MMRC1 = can walk nl pace, flat grade, can't hurry or go uphills or steps s sob   Better with meals now  Cough: hoarseness p symb Sleeping: fine after temazepam SABA use: one daily s pattern occurs at rest not noct  02: none  Rec See Mable for your hoarseness and possible of CA (cricoaretenoiditis) > neg eval  Try symbicort  80 dosed just one twice daily (immediate on awakening and 12 hours later)  to see if ex tolerance changes or your need for albuterol  goes up and if so resume Take 2 puffs first thing in am and then another 2 puffs about 12 hours later.  Only use your albuterol  as a rescue medication to   11/18/2022  f/u ov/Kristy Perez re: RA/ hoarseness.sob  maint on advair 45  2bid  sometimes pepcid   Chief Complaint  Patient presents with   Consult    SOB x 5 years  Dyspnea:  MMRC1 = can walk nl pace, flat grade, can't hurry or go uphills or steps s  sob   Cough: when gets hot / perfume also > very hoarse with coughing fits and urge to clear throat  Sleeping: flat bed one pillow s  resp cc  SABA use: none  02: none  Rec Make sure you check your oxygen saturation at your highest level of activity(NOT after you stop)  to be sure it stays over 90%  Try advair 45  2 puffs each am with the 3rd 4th puff option if doing great and not needing any rescue  GERD diet reviewed, bed blocks rec    - PFT's  03/25/23  FEV1 1.39 (74 % ) ratio 0.66  p 20 % improvement from saba p 0 prior to study with DLCO  16.8 (94%)   and FV curve mildly concave      06/28/2023  f/u ov/ office/Kristy Perez re: GOLD 2 copd/ AB  maint on symbicort  80  2bid  Chief Complaint  Patient presents with   Follow-up    F/u after PFT.  Dyspnea:  MMRC1 - hasn't tried power walks yet since starting symbicort   Cough: none  Sleeping: flat bed on side one pillow s  resp cc  SABA use: none unless exposed to heat (artificial or outdoors)  02:  none  Rec Symbicort  80 = Breyna  80   2 puffs first thing in and 12 hours later if needed Only use your albuterol  as a rescue medication to be used if you can't catch your breat Also  Ok to try albuterol  15 min before an activity (on alternating days)  that you know would usually make you short of breath      12/13/2023  f/u ov/Kristy Perez re: GOLD 2 copd/ AB    maint on symb 80    Chief Complaint  Patient presents with   Shortness of Breath   Dyspnea:  worse x 3 months doe but  sats still well above  90%RA walking up to one hour per day total  usually not more than a few min at a time Cough: if gets hot  Sleeping: flat bed one pillow on side s  resp cc  SABA use: once a day  02: none   No obvious day to day or daytime variability or assoc excess/ purulent sputum or mucus plugs or hemoptysis or cp or chest tightness, subjective wheeze or overt sinus or hb symptoms.    Also denies any obvious fluctuation of symptoms with weather or environmental changes or other aggravating or alleviating factors except as outlined above   No unusual exposure hx or h/o childhood pna/ asthma or knowledge of premature birth.  Current Allergies, Complete Past Medical History, Past Surgical History, Family History, and Social History were reviewed in [name].  ROS  The following are not active complaints unless bolded Hoarseness, sore throat, dysphagia, dental problems, itching, sneezing,  nasal congestion or discharge of excess mucus or purulent secretions, ear ache,   fever, chills, sweats, unintended wt loss or wt gain, classically pleuritic or exertional cp,  orthopnea pnd or arm/hand swelling  or leg swelling, presyncope, palpitations, abdominal pain, anorexia, nausea, vomiting, diarrhea  or change in bowel habits or change in bladder habits, change in stools or change in urine, dysuria, hematuria,  rash, arthralgias, visual complaints, headache, numbness, weakness or ataxia or  problems with walking or coordination,  change in mood or  memory.        Current Meds  Medication Sig   Abatacept  (ORENCIA  IV) Inject 500 mg into  the vein every 30 (thirty) days. Infuse 2 vials / 500 mg over 30 minutes every 4 weeks   acetaminophen  (TYLENOL ) 325 MG tablet Take 650 mg by mouth daily as needed (One hour prior to infusion).    albuterol  (VENTOLIN  HFA) 108 (90 Base) MCG/ACT inhaler Inhale 2 puffs into the lungs every 4 (four) hours as needed for wheezing or shortness of breath.   amoxicillin  (AMOXIL ) 500 MG tablet Before dental procedures   aspirin  EC 81 MG tablet Take 1 tablet (81 mg total) by mouth daily.   budesonide -formoterol  (BREYNA ) 80-4.5 MCG/ACT inhaler Take 2 puffs first thing in am and then another 2 puffs about 12 hours later.   calcium  carbonate ([employer] CALCIUM ) 600 MG tablet Take 600 mg by mouth 2 (two) times daily with a meal.   chlorpheniramine (CHLOR-TRIMETON) 4 MG tablet Take 4 mg by mouth daily as needed for allergies.   cycloSPORINE  (RESTASIS ) 0.05 % ophthalmic emulsion 1 drop 2 (two) times daily.   diphenhydrAMINE  (BENADRYL ) 25 mg capsule Take 25 mg by mouth daily as needed (One hour prior to infusion).    ezetimibe (ZETIA) 10 MG tablet Take 10 mg by mouth daily.   famotidine  (PEPCID ) 20 MG tablet One after  bfast and supper   ibuprofen (ADVIL,MOTRIN) 200 MG tablet Take 400 mg by mouth every 6 (six) hours as needed (for inflammation).   lidocaine  (LIDODERM ) 5 % Place 1 patch onto the skin daily. Remove & Discard patch within 12 hours or as directed by MD   Magnesium 250 MG TABS Take 500-1,000 tablets by mouth daily.    montelukast (SINGULAIR) 10 MG tablet Take 10 mg by mouth daily.   NATURAL VITAMIN D -3 125 MCG (5000 UT) TABS TAKE 1 TABLET BY MOUTH EVERY DAY   NONFORMULARY OR COMPOUNDED ITEM Estradiol  0.02% vaginal cream.  Place 1/2 gram vaginally two to three times weekly at bedtime. Disp:  3 month supply   Sodium Fluoride 1.1 % PSTE Place 1 application onto  teeth 2 (two) times daily.    temazepam (RESTORIL) 30 MG capsule Take 30 mg by mouth at bedtime.             Past Medical History:  Diagnosis Date   Anemia    Asthma    inhaler one x per day   Cancer [facility])    breast, left   Chiari malformation    Difficult intubation    cannot hyperextend neck due to Chiari malformation with tonsillar involvement, RA   Dysrhythmia 2015   history of PAC's   GERD (gastroesophageal reflux disease)    PONV (postoperative nausea and vomiting)    Rheumatoid arthritis (HCC)    on Humira        Objective:     wts  12/13/2023       126  06/28/2023        125   11/18/2022       129   11/28/18 129 lb 3.2 oz (58.6 kg)  10/10/18 129 lb (58.5 kg)  08/22/18 128 lb 9.6 oz (58.3 kg)      Vital signs reviewed  12/13/2023  - Note at rest 02 sats  96% on RA   General appearance:    pleasant amb wf nad      HEENT : Oropharynx  clear   Nasal turbinates nl    NECK :  without  apparent JVD/ palpable Nodes/TM    LUNGS: no acc muscle use,  Min barrel  contour chest wall  with bilateral  slightly decreased bs s audible wheeze and  without cough on insp or exp maneuvers and min  Hyperresonant  to  percussion bilaterally    CV:  RRR  no s3 or murmur or increase in P2, and no edema   ABD:  soft and nontender    MS:  Nl gait/ ext warm without deformities Or obvious joint restrictions  calf tenderness, cyanosis or clubbing     SKIN: warm and dry without lesions    NEURO:  alert, approp, nl sensorium with  no motor or cerebellar deficits apparent.             Assessment   Assessment & Plan COPD GOLD 2 Onset doe winter 2019 typically pc but also MMRC=1 assoc with voice fatgue - 10/10/2018  try symb 80 2bid as dulera  100 not on her plan  - 11/28/2018   [facility] RA  2 laps @  approx 279ft each @ moderate pace  stopped due to  End of study, no sob with sats 94%  - 11/28/2018  reduce sym 80 to 1 bid to see if any change hoarsness, sob, saba need -  11/18/2022  After extensive coaching inhaler device,  effectiveness =    90% so advair 45 reduce to 2 each am with pm doses prn flares or any requirement for saba  - 11/18/2022   Walked on RA  x  3  lap(s) =  approx 750  ft  @ brisk pace, stopped due to end of study, talking continuously s sob  with lowest 02 sats 95%   - PFT's  03/25/23  FEV1 1.39 (74 % ) ratio 0.66  p 20 % improvement from saba p 0 prior to study with DLCO  16.8 (94%)   and FV curve mildly concave  - 06/21/23 started on symbicort  80 2 each am and 2 in pm   - 12/13/2023  After extensive coaching inhaler device,  effectiveness =    90%   >>>  try breztri due to perceived worsening doe/ monitor sats at peak ex and also due to cost of even the generic brand of symbicort  and if not affordable will submit to pharmacy for best option   Discussed in detail all the  indications, usual  risks and alternatives  relative to the benefits with patient who agrees to proceed with Rx as outlined.             Each maintenance medication was reviewed in detail including emphasizing most importantly the difference between maintenance and prns and under what circumstances the prns are to be triggered using an action plan format where appropriate.  Total time for H and P, chart review, counseling, reviewing hfa  device(s) and generating customized AVS unique to this office visit / same day charting = 21 min          AVS  Patient Instructions  To get the most out of exercise, you need to be continuously aware that you are short of breath, but never out of breath, for at least 30 minutes daily. As you improve, it will actually be easier for you to do the same amount of exercise  in  30 minutes so always push to the level where you are short of breath.    Make sure you check your oxygen saturation  AT  your highest level of activity (not after you stop)   to be sure it stays over 90%   Also  Ok to  try albuterol  15 min before an activity (on  alternating days)  that you know would usually make you short of breath and see if it makes any difference and if makes none then don't take albuterol  after activity unless you can't catch your breath as this means it's the resting that helps, not the albuterol .  Try breztri Take 2 puffs first thing in am and then another 2 puffs about 12 hours later.     Please schedule a follow up visit in 12  months but call sooner if needed        Ozell America, MD 12/14/2023

## 2023-12-14 NOTE — Assessment & Plan Note (Addendum)
 Onset doe winter 2019 typically pc but also MMRC=1 assoc with voice fatgue - 10/10/2018  try symb 80 2bid as dulera  100 not on her plan  - 11/28/2018   [facility] RA  2 laps @  approx 243ft each @ moderate pace  stopped due to  End of study, no sob with sats 94%  - 11/28/2018  reduce sym 80 to 1 bid to see if any change hoarsness, sob, saba need - 11/18/2022  After extensive coaching inhaler device,  effectiveness =    90% so advair 45 reduce to 2 each am with pm doses prn flares or any requirement for saba  - 11/18/2022   Walked on RA  x  3  lap(s) =  approx 750  ft  @ brisk pace, stopped due to end of study, talking continuously s sob  with lowest 02 sats 95%   - PFT's  03/25/23  FEV1 1.39 (74 % ) ratio 0.66  p 20 % improvement from saba p 0 prior to study with DLCO  16.8 (94%)   and FV curve mildly concave  - 06/21/23 started on symbicort  80 2 each am and 2 in pm   - 12/13/2023  After extensive coaching inhaler device,  effectiveness =    90%   >>>  try breztri due to perceived worsening doe/ monitor sats at peak ex and also due to cost of even the generic brand of symbicort  and if not affordable will submit to pharmacy for best option   Discussed in detail all the  indications, usual  risks and alternatives  relative to the benefits with patient who agrees to proceed with Rx as outlined.             Each maintenance medication was reviewed in detail including emphasizing most importantly the difference between maintenance and prns and under what circumstances the prns are to be triggered using an action plan format where appropriate.  Total time for H and P, chart review, counseling, reviewing hfa  device(s) and generating customized AVS unique to this office visit / same day charting = 21 min

## 2023-12-19 ENCOUNTER — Telehealth: Payer: Self-pay

## 2023-12-19 NOTE — Telephone Encounter (Signed)
 Patient left a voicemail stating she is scheduled for an Orencia  infusion on 12/26/2023 and a colonoscopy on 01/04/2024. Patient would like to know if her infusion should be rescheduled or if she is okay to proceed with the scheduled appointment? She also asked if she will require any type of antibiotics prior to the colonoscopy?

## 2023-12-19 NOTE — Telephone Encounter (Signed)
 No need to postpone orencia  infusion. No need for antibiotics unless GI feels this is medically necessary-antibiotic use is not routine.

## 2023-12-20 NOTE — Telephone Encounter (Signed)
 Advised patient that  No need to postpone orencia  infusion. No need for antibiotics unless GI feels this is medically necessary-antibiotic use is not routine.     Patient verbalized understanding.

## 2023-12-22 ENCOUNTER — Telehealth (HOSPITAL_BASED_OUTPATIENT_CLINIC_OR_DEPARTMENT_OTHER): Payer: Self-pay

## 2023-12-22 NOTE — Telephone Encounter (Signed)
 Patient is returning call.

## 2023-12-22 NOTE — Telephone Encounter (Signed)
   Pre-operative Risk Assessment    Patient Name: Kristy Perez  DOB: 01-Feb-1951 MRN: [id number]   Date of last office visit: 08/04/22 with Dr Ladona Date of next office visit: Na   Request for Surgical Clearance    Procedure:  Colonoscopy  Date of Surgery:  Clearance 01/04/24                                 Surgeon:  Dr. Kristie or Dr. Rollin Socks Group or Practice Name:  [facility] Phone number:  [phone]  Fax number:  [phone]   Type of Clearance Requested:   - Medical  - Pharmacy:  Hold Aspirin  not indicated   Type of Anesthesia:  propofol    Additional requests/questions:    Bonney Augustin JONETTA Delores   12/22/2023, 12:26 PM

## 2023-12-22 NOTE — Telephone Encounter (Signed)
 Pt has been scheduled in office appt preop clearance 12/26/23 @ 8:50 Rosaline Bane, NP at [facility] location. Pt is aware of the address for [facility].

## 2023-12-22 NOTE — Telephone Encounter (Signed)
 1st attempt left message on patient's voicemail to call to schedule pre op clearance.

## 2023-12-22 NOTE — Telephone Encounter (Signed)
   Name: Kristy Perez  DOB: 06/28/1950  MRN: [id number]  Primary Cardiologist: None  Chart reviewed as part of pre-operative protocol coverage. Because of [name]'s past medical history and time since last visit, she will require a follow-up in-office visit in order to better assess preoperative cardiovascular risk.  Pre-op covering staff: - Please schedule appointment and call patient to inform them. If patient already had an upcoming appointment within acceptable timeframe, please add pre-op clearance to the appointment notes so provider is aware. - Please contact requesting surgeon's office via preferred method (i.e, phone, fax) to inform them of need for appointment prior to surgery.  Barnie Hila, NP  12/22/2023, 1:18 PM

## 2023-12-25 NOTE — Progress Notes (Unsigned)
  Cardiology Office Note   Date:  12/25/2023  ID:  Kristy Perez, DOB May 13, 1950, MRN [id number] PCP: Kristy Velna SAUNDERS, MD  [facility] Providers Cardiologist:  None { Click to update primary MD,subspecialty MD or APP then REFRESH:1}    PMH COPD Rheumatoid arthritis Dyspnea on exertion GERD Aortic atherosclerosis Hyperlipidemia Coronary artery disease CT Calcium  score 11/29/22 CAC Score 60.4 (63rd percentile) LM 0, LAD 60.4, LCx 0, RCA 0 Low risk myoview  07/29/2021 Left breast cancer  S/p lumpectomy, XRT, chemo in 2003 Mitral and tricuspid regurgitation Former tobacco abuse Quit 2001 (> 40 pack year history)  Seen by Dr. Ladona 07/02/2021 for DOE which she felt was worsening over previous 6-8 months. Nuclear stress test was low risk and echo with normal LVEF 60% revealed mild MR and mild to moderate TR. She was advised to start aspirin .   Last clinic visit was 08/04/2022 with Dr. Ladona. Lipids were well controlled on rosuvastatin 5 mg daily. She felt occasional palpitations and chronic dyspnea, but no PND, orthopnea, or edema. No significant cardiac concerns. Was advised to follow-up as needed.   History of Present Illness Kristy Perez is a 73 y.o. female ***  ROS: ***  Studies Reviewed      ***  No results found for: LIPOA  Risk Assessment/Calculations {Does this patient have ATRIAL FIBRILLATION?:[phone]} No BP recorded.  {Refresh Note OR Click here to enter BP  :1}***       Physical Exam VS:  LMP 02/15/1985 (Approximate)    Wt Readings from Last 3 Encounters:  12/13/23 126 lb (57.2 kg)  08/23/23 125 lb 9.6 oz (57 kg)  06/28/23 125 lb (56.7 kg)    GEN: Well nourished, well developed in no acute distress NECK: No JVD; No carotid bruits CARDIAC: ***RRR, no murmurs, rubs, gallops RESPIRATORY:  Clear to auscultation without rales, wheezing or rhonchi  ABDOMEN: Soft, non-tender, non-distended EXTREMITIES:  No edema; No deformity   ASSESSMENT AND  PLAN ***    {Are you ordering a CV Procedure (e.g. stress test, cath, DCCV, TEE, etc)?   Press F2        :[id number]}  Dispo: ***  Signed, Kristy Bane, NP-C

## 2023-12-26 ENCOUNTER — Ambulatory Visit (INDEPENDENT_AMBULATORY_CARE_PROVIDER_SITE_OTHER): Admitting: Nurse Practitioner

## 2023-12-26 ENCOUNTER — Encounter (HOSPITAL_BASED_OUTPATIENT_CLINIC_OR_DEPARTMENT_OTHER): Payer: Self-pay | Admitting: Nurse Practitioner

## 2023-12-26 VITALS — BP 124/62 | HR 96 | Ht 61.5 in | Wt 126.7 lb

## 2023-12-26 DIAGNOSIS — Z01810 Encounter for preprocedural cardiovascular examination: Secondary | ICD-10-CM | POA: Diagnosis not present

## 2023-12-26 DIAGNOSIS — I071 Rheumatic tricuspid insufficiency: Secondary | ICD-10-CM | POA: Diagnosis not present

## 2023-12-26 DIAGNOSIS — I340 Nonrheumatic mitral (valve) insufficiency: Secondary | ICD-10-CM

## 2023-12-26 DIAGNOSIS — I700 Atherosclerosis of aorta: Secondary | ICD-10-CM

## 2023-12-26 DIAGNOSIS — R002 Palpitations: Secondary | ICD-10-CM | POA: Diagnosis not present

## 2023-12-26 DIAGNOSIS — E785 Hyperlipidemia, unspecified: Secondary | ICD-10-CM | POA: Diagnosis not present

## 2023-12-26 DIAGNOSIS — J449 Chronic obstructive pulmonary disease, unspecified: Secondary | ICD-10-CM

## 2023-12-26 DIAGNOSIS — M0519 Rheumatoid lung disease with rheumatoid arthritis of multiple sites: Secondary | ICD-10-CM | POA: Diagnosis not present

## 2023-12-26 MED ORDER — ROSUVASTATIN CALCIUM 5 MG PO TABS: 5.0000 mg | ORAL_TABLET | ORAL | 3 refills | Status: AC

## 2023-12-26 NOTE — Patient Instructions (Signed)
 Medication Instructions:   RESTART Rosuvastatin one (1) tablet by mouth ( 5 mg) 3 times weekly.  *If you need a refill on your cardiac medications before your next appointment, please call your pharmacy*  Lab Work:  Patient will get fasting labs at [facility].  If you have labs (blood work) drawn today and your tests are completely normal, you will receive your results only by: MyChart Message (if you have MyChart) OR A paper copy in the mail If you have any lab test that is abnormal or we need to change your treatment, we will call you to review the results.  Testing/Procedures:  None ordered.  Follow-Up: At [facility], you and your health needs are our priority.  As part of our continuing mission to provide you with exceptional heart care, our providers are all part of one team.  This team includes your primary Cardiologist (physician) and Advanced Practice Providers or APPs (Physician Assistants and Nurse Practitioners) who all work together to provide you with the care you need, when you need it.  Your next appointment:   1 year(s)  Provider:   Rosaline Bane, NP    We recommend signing up for the patient portal called MyChart.  Sign up information is provided on this After Visit Summary.  MyChart is used to connect with patients for Virtual Visits (Telemedicine).  Patients are able to view lab/test results, encounter notes, upcoming appointments, etc.  Non-urgent messages can be sent to your provider as well.   To learn more about what you can do with MyChart, go to [email or web address].   Other Instructions  Your physician wants you to follow-up in: 1 year.  You will receive a reminder letter in the mail two months in advance. If you don't receive a letter, please call our office to schedule the follow-up appointment.

## 2024-01-03 DIAGNOSIS — L570 Actinic keratosis: Secondary | ICD-10-CM | POA: Diagnosis not present

## 2024-01-03 DIAGNOSIS — L578 Other skin changes due to chronic exposure to nonionizing radiation: Secondary | ICD-10-CM | POA: Diagnosis not present

## 2024-01-03 DIAGNOSIS — D225 Melanocytic nevi of trunk: Secondary | ICD-10-CM | POA: Diagnosis not present

## 2024-01-03 DIAGNOSIS — Z85828 Personal history of other malignant neoplasm of skin: Secondary | ICD-10-CM | POA: Diagnosis not present

## 2024-01-03 DIAGNOSIS — D485 Neoplasm of uncertain behavior of skin: Secondary | ICD-10-CM | POA: Diagnosis not present

## 2024-01-03 DIAGNOSIS — L821 Other seborrheic keratosis: Secondary | ICD-10-CM | POA: Diagnosis not present

## 2024-01-03 DIAGNOSIS — L820 Inflamed seborrheic keratosis: Secondary | ICD-10-CM | POA: Diagnosis not present

## 2024-01-03 DIAGNOSIS — L814 Other melanin hyperpigmentation: Secondary | ICD-10-CM | POA: Diagnosis not present

## 2024-01-04 DIAGNOSIS — Z1211 Encounter for screening for malignant neoplasm of colon: Secondary | ICD-10-CM | POA: Diagnosis not present

## 2024-01-04 DIAGNOSIS — K5730 Diverticulosis of large intestine without perforation or abscess without bleeding: Secondary | ICD-10-CM | POA: Diagnosis not present

## 2024-01-04 DIAGNOSIS — K635 Polyp of colon: Secondary | ICD-10-CM | POA: Diagnosis not present

## 2024-01-05 DIAGNOSIS — R1024 Suprapubic pain: Secondary | ICD-10-CM | POA: Diagnosis not present

## 2024-01-05 DIAGNOSIS — M62838 Other muscle spasm: Secondary | ICD-10-CM | POA: Diagnosis not present

## 2024-01-05 DIAGNOSIS — N3946 Mixed incontinence: Secondary | ICD-10-CM | POA: Diagnosis not present

## 2024-01-05 DIAGNOSIS — M6281 Muscle weakness (generalized): Secondary | ICD-10-CM | POA: Diagnosis not present

## 2024-01-17 NOTE — Progress Notes (Unsigned)
 Office Visit Note  [name]             Date of Birth: 1950/07/05           MRN: [id number]             PCP: Vernon Velna SAUNDERS, MD Referring: Vernon Velna SAUNDERS, MD Visit Date: 01/31/2024 Occupation: Data Unavailable  Subjective:  No chief complaint on file.   History of Present Illness: Kristy Perez is a 73 y.o. female ***     Activities of Daily Living:  Patient reports morning stiffness for *** {minute/hour:19697}.   Patient {ACTIONS;DENIES/REPORTS:21021675::Denies} nocturnal pain.  Difficulty dressing/grooming: {ACTIONS;DENIES/REPORTS:21021675::Denies} Difficulty climbing stairs: {ACTIONS;DENIES/REPORTS:21021675::Denies} Difficulty getting out of chair: {ACTIONS;DENIES/REPORTS:21021675::Denies} Difficulty using hands for taps, buttons, cutlery, and/or writing: {ACTIONS;DENIES/REPORTS:21021675::Denies}  No Rheumatology ROS completed.   PMFS History:  Patient Active Problem List   Diagnosis Date Noted   Numbness 03/31/2020   Mononeuropathy multiplex syndrome 03/31/2020   Neuritis of right ulnar nerve 03/31/2020   Polyneuropathy 03/31/2020   Hoarseness with classic voice fatigue 10/10/2018   S/P lumbar laminectomy 04/06/2018   Radiculopathy due to lumbar intervertebral disc disorder 12/07/2017   Congenital spondylolysis 12/07/2017   Spondylolisthesis of lumbar region 12/07/2017   DDD (degenerative disc disease), lumbar 12/08/2016   High risk medication use 03/15/2016   DJD (degenerative joint disease), cervical 03/15/2016   History of asthma 03/15/2016   History of squamous cell carcinoma 03/15/2016   History of basal cell carcinoma 03/15/2016   History of breast cancer 03/15/2016   History of Chiari malformation 03/15/2016   Vitamin D  deficiency 03/15/2016   Rheumatoid arthritis with rheumatoid factor of multiple sites without organ or systems involvement (HCC) 10/20/2015   Esophageal reflux 05/01/2015   Anxiety 05/01/2015   Sjogrens  syndrome 05/01/2015   COPD GOLD 2 05/01/2015   Deviated nasal septum 05/24/2014    Class: Chronic   Insomnia 11/25/2007   DOE (dyspnea on exertion) 11/15/2007    Past Medical History:  Diagnosis Date   Anemia    Asthma    inhaler one x per day   Breast cancer (HCC)    Cancer (HCC)    breast, left   Chiari malformation    COPD (chronic obstructive pulmonary disease) (HCC) 06/28/2023   Difficult intubation    cannot hyperextend neck due to Chiari malformation with tonsillar involvement, RA   Dysrhythmia 2015   history of PAC's   GERD (gastroesophageal reflux disease)    High cholesterol    dx by PCP per patient    Personal history of radiation therapy    PONV (postoperative nausea and vomiting)    Rheumatoid arthritis (HCC)    on Humira   RSV (respiratory syncytial virus infection) 09/08/2022    Family History  Problem Relation Age of Onset   Diabetes Mother    Heart failure Mother    Bladder Cancer Mother    Stroke Mother    Cancer Father        head and neck   Uterine cancer Maternal Aunt    Bladder Cancer Maternal Grandmother    Colon cancer Maternal Grandmother    Breast cancer Cousin    Past Surgical History:  Procedure Laterality Date   BREAST SURGERY Left 2003   Lumpectomy T1C1, NO and negative ER/ PR   COLONOSCOPY  10/04   COLONOSCOPY  11/10   normal recheck in 5 years   COLPORRHAPHY     ESOPHAGOGASTRODUODENOSCOPY ENDOSCOPY  11/10   GERD  FOOT SURGERY Left age 88   removal of pseudo rheumatoid nodules from left forefoot.   LAPAROSCOPIC BILATERAL SALPINGO OOPHERECTOMY N/A 07/30/2013   Procedure: LAPAROSCOPIC BILATERAL SALPINGO OOPHORECTOMY WITH WASHINGS;  Surgeon: Ronal Elvie Pinal, MD;  Location: [facility];  Service: Gynecology;  Laterality: N/A;   LUMBAR LAMINECTOMY/DECOMPRESSION MICRODISCECTOMY Right 04/06/2018   Procedure: Extraforaminal Microdiscectomy - Lumbar Two-Lumbar Three - right;  Surgeon: Joshua Alm RAMAN, MD;  Location: [facility] OR;  Service:  Neurosurgery;  Laterality: Right;  Extraforaminal Microdiscectomy - Lumbar Two-Lumbar Three - right   NASAL SEPTOPLASTY W/ TURBINOPLASTY Bilateral 05/24/2014   Procedure: NASAL SEPTOPLASTY WITH  BILATERAL TURBINATE REDUCTION;  Surgeon: Alm Bouche, MD;  Location: [facility] OR;  Service: ENT;  Laterality: Bilateral;   PELVIC FLOOR REPAIR  3/04   SPARC with AP colporrhaphy   PILONIDAL CYST EXCISION     TOTAL VAGINAL HYSTERECTOMY  1989   with AP repair   Social History   Tobacco Use   Smoking status: Former    Current packs/day: 0.00    Average packs/day: 1.5 packs/day for 30.0 years (45.0 ttl pk-yrs)    Types: Cigarettes    Start date: 02/15/1969    Quit date: 02/16/1999    Years since quitting: 24.9    Passive exposure: Past   Smokeless tobacco: Never  Vaping Use   Vaping status: Never Used  Substance Use Topics   Alcohol use: No   Drug use: No   Social History   Social History Narrative   Lives alone in a 2 story home. Has 2 children.  Works as a catering manager.  Education: college.      Immunization History  Administered Date(s) Administered   Fluad Quad(high Dose 65+) 10/14/2018   INFLUENZA, HIGH DOSE SEASONAL PF 10/26/2017   Influenza,inj,Quad PF,6+ Mos 10/18/2016   Influenza-Unspecified 10/08/2014, 10/24/2015   Janssen (J&J) SARS-COV-2 Vaccination 05/01/2019   PFIZER(Purple Top)SARS-COV-2 Vaccination 02/15/2020, 09/03/2020   Pneumococcal-Unspecified 10/18/2007   Tdap 11/24/2008, 05/04/2021   Zoster Recombinant(Shingrix) 12/25/2016     Objective: Vital Signs: LMP 02/15/1985 (Approximate)    Physical Exam   Musculoskeletal Exam: ***  CDAI Exam: CDAI Score: -- Patient Global: --; Provider Global: -- Swollen: --; Tender: -- Joint Exam 01/31/2024   No joint exam has been documented for this visit   There is currently no information documented on the homunculus. Go to the Rheumatology activity and complete the homunculus joint exam.  Investigation: No additional  findings.  Imaging: No results found.  Recent Labs: Lab Results  Component Value Date   WBC 8.0 11/03/2023   HGB 12.9 11/03/2023   PLT 363 11/03/2023   NA 137 11/03/2023   K CANCELED 11/03/2023   CL 103 11/03/2023   CO2 20 11/03/2023   GLUCOSE 87 11/03/2023   BUN 15 11/03/2023   CREATININE 0.54 (L) 11/03/2023   BILITOT 0.5 11/03/2023   ALKPHOS 77 11/03/2023   AST 37 11/03/2023   ALT 21 11/03/2023   PROT 6.4 11/03/2023   ALBUMIN 4.4 11/03/2023   CALCIUM  9.1 11/03/2023   GFRAA 114 03/31/2020   QFTBGOLD Negative 06/15/2016   QFTBGOLDPLUS NEGATIVE 08/23/2023    Speciality Comments: Orencia  IV 500mg  every 4 weeks, patient infuses at [facility] Tb gold negative 06/15/16  Procedures:  No procedures performed Allergies: Fish allergy, Iodine, Wound dressing adhesive, Boniva [ibandronate], Crestor  [rosuvastatin ], Fosamax [alendronate], Morphine , Paxlovid [nirmatrelvir-ritonavir], Sulfamethoxazole-trimethoprim, and Sulfonamide derivatives   Assessment / Plan:     Visit Diagnoses: No diagnosis found.  Orders: No orders of the defined  types were placed in this encounter.  No orders of the defined types were placed in this encounter.   Face-to-face time spent with patient was *** minutes. Greater than 50% of time was spent in counseling and coordination of care.  Follow-Up Instructions: No follow-ups on file.   Daved JAYSON Gavel, CMA  Note - This record has been created using Animal nutritionist.  Chart creation errors have been sought, but may not always  have been located. Such creation errors do not reflect on  the standard of medical care.

## 2024-01-23 ENCOUNTER — Other Ambulatory Visit: Payer: Self-pay

## 2024-01-23 DIAGNOSIS — Z79899 Other long term (current) drug therapy: Secondary | ICD-10-CM

## 2024-01-23 DIAGNOSIS — M0579 Rheumatoid arthritis with rheumatoid factor of multiple sites without organ or systems involvement: Secondary | ICD-10-CM

## 2024-01-23 DIAGNOSIS — M0519 Rheumatoid lung disease with rheumatoid arthritis of multiple sites: Secondary | ICD-10-CM | POA: Diagnosis not present

## 2024-01-26 ENCOUNTER — Ambulatory Visit: Payer: Self-pay | Admitting: Neurology

## 2024-01-26 LAB — CBC WITH DIFFERENTIAL/PLATELET
Basophils Absolute: 0.1 x10E3/uL (ref 0.0–0.2)
Basos: 1 %
EOS (ABSOLUTE): 0.3 x10E3/uL (ref 0.0–0.4)
Eos: 3 %
Hematocrit: 39.3 % (ref 34.0–46.6)
Hemoglobin: 12.7 g/dL (ref 11.1–15.9)
Immature Grans (Abs): 0 x10E3/uL (ref 0.0–0.1)
Immature Granulocytes: 0 %
Lymphocytes Absolute: 2.4 x10E3/uL (ref 0.7–3.1)
Lymphs: 28 %
MCH: 28.7 pg (ref 26.6–33.0)
MCHC: 32.3 g/dL (ref 31.5–35.7)
MCV: 89 fL (ref 79–97)
Monocytes Absolute: 0.5 x10E3/uL (ref 0.1–0.9)
Monocytes: 6 %
Neutrophils Absolute: 5.3 x10E3/uL (ref 1.4–7.0)
Neutrophils: 62 %
Platelets: 394 x10E3/uL (ref 150–450)
RBC: 4.42 x10E6/uL (ref 3.77–5.28)
RDW: 12.7 % (ref 11.7–15.4)
WBC: 8.6 x10E3/uL (ref 3.4–10.8)

## 2024-01-26 LAB — COMPREHENSIVE METABOLIC PANEL WITH GFR
ALT: 15 IU/L (ref 0–32)
AST: 18 IU/L (ref 0–40)
Albumin: 4.4 g/dL (ref 3.8–4.8)
Alkaline Phosphatase: 75 IU/L (ref 49–135)
BUN/Creatinine Ratio: 25 (ref 12–28)
BUN: 16 mg/dL (ref 8–27)
Bilirubin Total: 0.4 mg/dL (ref 0.0–1.2)
CO2: 20 mmol/L (ref 20–29)
Calcium: 9.4 mg/dL (ref 8.7–10.3)
Chloride: 100 mmol/L (ref 96–106)
Creatinine, Ser: 0.64 mg/dL (ref 0.57–1.00)
Globulin, Total: 1.8 g/dL (ref 1.5–4.5)
Sodium: 139 mmol/L (ref 134–144)
Total Protein: 6.2 g/dL (ref 6.0–8.5)
eGFR: 94 mL/min/1.73 (ref >59)

## 2024-01-31 ENCOUNTER — Ambulatory Visit: Admitting: Rheumatology

## 2024-01-31 ENCOUNTER — Encounter: Payer: Self-pay | Admitting: Rheumatology

## 2024-01-31 ENCOUNTER — Telehealth: Payer: Self-pay | Admitting: Pharmacist

## 2024-01-31 VITALS — BP 116/68 | HR 88 | Temp 97.9°F | Resp 18 | Ht 61.0 in | Wt 124.8 lb

## 2024-01-31 DIAGNOSIS — E785 Hyperlipidemia, unspecified: Secondary | ICD-10-CM

## 2024-01-31 DIAGNOSIS — Z8669 Personal history of other diseases of the nervous system and sense organs: Secondary | ICD-10-CM

## 2024-01-31 DIAGNOSIS — Z8709 Personal history of other diseases of the respiratory system: Secondary | ICD-10-CM

## 2024-01-31 DIAGNOSIS — M0579 Rheumatoid arthritis with rheumatoid factor of multiple sites without organ or systems involvement: Secondary | ICD-10-CM

## 2024-01-31 DIAGNOSIS — Z8589 Personal history of malignant neoplasm of other organs and systems: Secondary | ICD-10-CM | POA: Diagnosis present

## 2024-01-31 DIAGNOSIS — K219 Gastro-esophageal reflux disease without esophagitis: Secondary | ICD-10-CM | POA: Diagnosis present

## 2024-01-31 DIAGNOSIS — M7061 Trochanteric bursitis, right hip: Secondary | ICD-10-CM | POA: Insufficient documentation

## 2024-01-31 DIAGNOSIS — R5383 Other fatigue: Secondary | ICD-10-CM | POA: Diagnosis present

## 2024-01-31 DIAGNOSIS — E559 Vitamin D deficiency, unspecified: Secondary | ICD-10-CM

## 2024-01-31 DIAGNOSIS — M7712 Lateral epicondylitis, left elbow: Secondary | ICD-10-CM

## 2024-01-31 DIAGNOSIS — F5101 Primary insomnia: Secondary | ICD-10-CM | POA: Insufficient documentation

## 2024-01-31 DIAGNOSIS — Z853 Personal history of malignant neoplasm of breast: Secondary | ICD-10-CM

## 2024-01-31 DIAGNOSIS — R0602 Shortness of breath: Secondary | ICD-10-CM | POA: Diagnosis present

## 2024-01-31 DIAGNOSIS — M5030 Other cervical disc degeneration, unspecified cervical region: Secondary | ICD-10-CM

## 2024-01-31 DIAGNOSIS — M7711 Lateral epicondylitis, right elbow: Secondary | ICD-10-CM

## 2024-01-31 DIAGNOSIS — Z85828 Personal history of other malignant neoplasm of skin: Secondary | ICD-10-CM

## 2024-01-31 DIAGNOSIS — M810 Age-related osteoporosis without current pathological fracture: Secondary | ICD-10-CM | POA: Diagnosis present

## 2024-01-31 DIAGNOSIS — Z79899 Other long term (current) drug therapy: Secondary | ICD-10-CM

## 2024-01-31 DIAGNOSIS — M51369 Other intervertebral disc degeneration, lumbar region without mention of lumbar back pain or lower extremity pain: Secondary | ICD-10-CM

## 2024-01-31 MED ORDER — TRIAMCINOLONE ACETONIDE 40 MG/ML IJ SUSP
40.0000 mg | INTRAMUSCULAR | Status: AC | PRN
  Administered 2024-01-31: 12:00:00 40 mg via INTRA_ARTICULAR

## 2024-01-31 MED ORDER — LIDOCAINE HCL 1 % IJ SOLN
1.5000 mL | INTRAMUSCULAR | Status: AC | PRN
  Administered 2024-01-31: 12:00:00 1.5 mL

## 2024-01-31 NOTE — Patient Instructions (Signed)
 Vaccines You are taking a medication(s) that can suppress your immune system.  The following immunizations are recommended: Flu annually RSV Covid-19  Td/Tdap (tetanus, diphtheria, pertussis) every 10 years Pneumonia (Prevnar 15 then Pneumovax 23 at least 1 year apart.  Alternatively, can take Prevnar 20 without needing additional dose) Shingrix: 2 doses from 4 weeks to 6 months apart  Please check with your PCP to make sure you are up to date.   If you have signs or symptoms of an infection or start antibiotics: First, call your PCP for workup of your infection. Hold your medication through the infection, until you complete your antibiotics, and until symptoms resolve if you take the following: Injectable medication (Actemra, Benlysta, Cimzia, Cosentyx, Enbrel, Humira, Kevzara, Orencia , Remicade, Simponi, Stelara, Taltz, Tremfya) Methotrexate Leflunomide (Arava) Mycophenolate (Cellcept) Earma, Olumiant, or Rinvoq  Please get annual skin examination to screen for skin cancer while you are on Orencia .  Please use sun screen and sun protection.   Zoledronic Acid Injection (Bone Disorders) What is this medication? ZOLEDRONIC ACID (ZOE le dron ik AS id) prevents and treats osteoporosis. It may also be used to treat Paget's disease of the bone. It works by interior and spatial designer stronger and less likely to break (fracture). It belongs to a group of medications called bisphosphonates. This medicine may be used for other purposes; ask your health care provider or pharmacist if you have questions. COMMON BRAND NAME(S): Reclast What should I tell my care team before I take this medication? They need to know if you have any of these conditions: Bleeding disorder Cancer Dental disease Kidney disease Low levels of calcium  in the blood Low red blood cell counts Lung or breathing disease, such as asthma Receiving steroids, such as dexamethasone  or prednisone An unusual or allergic reaction to  zoledronic acid, other medications, foods, dyes, or preservatives Pregnant or trying to get pregnant Breast-feeding How should I use this medication? This medication is injected into a vein. It is given by your care team in a hospital or clinic setting. A special MedGuide will be given to you before each treatment. Be sure to read this information carefully each time. Talk to your care team about the use of this medication in children. Special care may be needed. Overdosage: If you think you have taken too much of this medicine contact a poison control center or emergency room at once. NOTE: This medicine is only for you. Do not share this medicine with others. What if I miss a dose? Keep appointments for follow-up doses. It is important not to miss your dose. Call your care team if you are unable to keep an appointment. What may interact with this medication? Certain antibiotics given by injection Medications for pain and inflammation, such as ibuprofen, naproxen, NSAIDs Some diuretics, such as bumetanide, furosemide Teriparatide This list may not describe all possible interactions. Give your health care provider a list of all the medicines, herbs, non-prescription drugs, or dietary supplements you use. Also tell them if you smoke, drink alcohol, or use illegal drugs. Some items may interact with your medicine. What should I watch for while using this medication? Visit your care team for regular checks on your progress. It may be some time before you see the benefit from this medication. Some people who take this medication have severe bone, joint, or muscle pain. This medication may also increase your risk for jaw problems or a broken thigh bone. Tell your care team right away if you have severe pain  in your jaw, bones, joints, or muscles. Tell your care team if you have any pain that does not go away or that gets worse. You should make sure you get enough calcium  and vitamin D  while you are  taking this medication. Discuss the foods you eat and the vitamins you take with your care team. You may need bloodwork while taking this medication. Tell your dentist and dental surgeon that you are taking this medication. You should not have major dental surgery while on this medication. See your dentist to have a dental exam and fix any dental problems before starting this medication. Take good care of your teeth while on this medication. Make sure you see your dentist for regular follow-up appointments. What side effects may I notice from receiving this medication? Side effects that you should report to your care team as soon as possible: Allergic reactions--skin rash, itching, hives, swelling of the face, lips, tongue, or throat Kidney injury--decrease in the amount of urine, swelling of the ankles, hands, or feet Low calcium  level--muscle pain or cramps, confusion, tingling, or numbness in the hands or feet Osteonecrosis of the jaw--pain, swelling, or redness in the mouth, numbness of the jaw, poor healing after dental work, unusual discharge from the mouth, visible bones in the mouth Severe bone, joint, or muscle pain Side effects that usually do not require medical attention (report to your care team if they continue or are bothersome): Diarrhea Dizziness Headache Nausea Stomach pain Vomiting This list may not describe all possible side effects. Call your doctor for medical advice about side effects. You may report side effects to FDA at 1-800-FDA-1088. Where should I keep my medication? This medication is given in a hospital or clinic. It will not be stored at home. NOTE: This sheet is a summary. It may not cover all possible information. If you have questions about this medicine, talk to your doctor, pharmacist, or health care provider.  2024 Elsevier/Gold Standard (2021-03-20 00:00:00)

## 2024-01-31 NOTE — Telephone Encounter (Signed)
-----   Message from [facility] Marissa G sent at 01/31/2024 11:41 AM EST ----- Patient was provided information and Dr. Dolphus discussed Reclast IV. She has history of COPD and asthma. Dr. Dolphus asked if you would review the patient taking Reclast IV with hx of asthma and COPD.   Patient is also asking about timing of this infusion in coordination with Orencia  infusions.   Thanks!

## 2024-01-31 NOTE — Telephone Encounter (Signed)
 Discussed with Dr. Dolphus that it is okay with proceed with Reclast with patient's pulmonary history.  Will place Reclast referral after labs from today result  Sherry Pennant, PharmD, MPH, BCPS, CPP Clinical Pharmacist

## 2024-02-01 DIAGNOSIS — E7800 Pure hypercholesterolemia, unspecified: Secondary | ICD-10-CM | POA: Diagnosis not present

## 2024-02-01 DIAGNOSIS — R7303 Prediabetes: Secondary | ICD-10-CM | POA: Diagnosis not present

## 2024-02-03 ENCOUNTER — Telehealth: Payer: Self-pay

## 2024-02-03 ENCOUNTER — Other Ambulatory Visit: Payer: Self-pay | Admitting: Pharmacist

## 2024-02-03 DIAGNOSIS — M810 Age-related osteoporosis without current pathological fracture: Secondary | ICD-10-CM | POA: Insufficient documentation

## 2024-02-03 NOTE — Progress Notes (Signed)
 Referral placed for Zoledronic acid (Reclast) IV [phone]) at [facility] [phone]) [place] to start benefits investigation.  Diagnosis: osteoporosis  Provider: Dr. Maya Nash  Dose: 5mg  iV every 12 months Premedications: acetaminophen  650mg  p.o. and diphenhydramine  25mg  p.o.  Last Clinic Visit: 01/31/2024 Next Clinic Visit: 07/03/2024  Pertinent baseline labs: renal function wnl, TSH, PTH, Vitamin D  wnl  Once benefits are processed, infusion center will contact patient to schedule.   Sherry Pennant, PharmD, MPH, BCPS, CPP Clinical Pharmacist

## 2024-02-03 NOTE — Telephone Encounter (Signed)
 Auth Submission: NO AUTH NEEDED Site of care: Site of care: [facility] Payer: Medicare A/B with Aet[employer] supplement Medication & CPT/J Code(s) submitted: Reclast (Zolendronic acid) [id number] Diagnosis Code:  Route of submission (phone, fax, portal):  Phone # Fax # Auth type: Buy/Bill PB Units/visits requested: 5mg  x 1 dose Reference number:  Approval from: 02/03/24 to 03/17/24

## 2024-02-04 LAB — PROTEIN ELECTROPHORESIS, SERUM, WITH REFLEX
Albumin ELP: 4.1 g/dL (ref 3.8–4.8)
Alpha 1: 0.3 g/dL (ref 0.2–0.3)
Alpha 2: 0.7 g/dL (ref 0.5–0.9)
Beta 2: 0.3 g/dL (ref 0.2–0.5)
Beta Globulin: 0.4 g/dL (ref 0.4–0.6)
Gamma Globulin: 0.4 g/dL — ABNORMAL LOW (ref 0.8–1.7)
Total Protein: 6.3 g/dL (ref 6.1–8.1)

## 2024-02-04 LAB — TSH: TSH: 0.65 m[IU]/L (ref 0.40–4.50)

## 2024-02-04 LAB — IFE INTERPRETATION

## 2024-02-04 LAB — VITAMIN D 25 HYDROXY (VIT D DEFICIENCY, FRACTURES): Vit D, 25-Hydroxy: 72 ng/mL (ref 30–100)

## 2024-02-04 LAB — PARATHYROID HORMONE, INTACT (NO CA): PTH: 22 pg/mL (ref 16–77)

## 2024-02-04 LAB — PHOSPHORUS: Phosphorus: 4.6 mg/dL — ABNORMAL HIGH (ref 2.1–4.3)

## 2024-02-05 ENCOUNTER — Ambulatory Visit: Payer: Self-pay | Admitting: Rheumatology

## 2024-02-05 NOTE — Progress Notes (Signed)
 Phosphorus mildly elevated and not significant.  SPEP non-specific, PTH normal, vitamin D  normal, TSH normal, lipid panel pending.

## 2024-02-15 ENCOUNTER — Ambulatory Visit (INDEPENDENT_AMBULATORY_CARE_PROVIDER_SITE_OTHER)

## 2024-02-15 VITALS — BP 109/59 | HR 61 | Temp 98.1°F | Resp 14 | Ht 61.0 in | Wt 123.4 lb

## 2024-02-15 DIAGNOSIS — M810 Age-related osteoporosis without current pathological fracture: Secondary | ICD-10-CM | POA: Diagnosis not present

## 2024-02-15 MED ORDER — SODIUM CHLORIDE 0.9 % IV SOLN: INTRAVENOUS | Status: DC

## 2024-02-15 MED ORDER — ZOLEDRONIC ACID 5 MG/100ML IV SOLN
5.0000 mg | Freq: Once | INTRAVENOUS | Status: AC
  Administered 2024-02-15: 5 mg via INTRAVENOUS
  Filled 2024-02-15: qty 100

## 2024-02-15 MED ORDER — ACETAMINOPHEN 325 MG PO TABS
650.0000 mg | ORAL_TABLET | Freq: Once | ORAL | Status: AC
  Administered 2024-02-15: 650 mg via ORAL
  Filled 2024-02-15: qty 2

## 2024-02-15 MED ORDER — DIPHENHYDRAMINE HCL 25 MG PO CAPS
25.0000 mg | ORAL_CAPSULE | Freq: Once | ORAL | Status: AC
  Administered 2024-02-15: 25 mg via ORAL
  Filled 2024-02-15: qty 1

## 2024-02-15 NOTE — Progress Notes (Signed)
 Diagnosis: Osteoporosis  Provider:  Lonna Coder MD  Procedure: IV Infusion  IV Type: Peripheral, IV Location: R Antecubital   Reclast (Zolendronic Acid), Dose: 5 mg  Infusion Start Time: 1455  Infusion Stop Time: 1526  Post Infusion IV Care: Observation period completed and Peripheral IV Discontinued  Discharge: Condition: Good, Destination: Home . AVS Provided  Performed by:  Maximiano JONELLE Pouch, LPN

## 2024-02-17 NOTE — Addendum Note (Signed)
 Addended by: DAYNE SHERRY RAMAN on: 02/17/2024 08:48 AM   Modules accepted: Orders

## 2024-02-20 ENCOUNTER — Telehealth: Payer: Self-pay | Admitting: *Deleted

## 2024-02-20 NOTE — Telephone Encounter (Signed)
 Patient contacted the office and states she had a Reclast  infusion on 02/15/2024. Patient states she had a terrible reaction and states she will not take it again. Patient states she had tremors, vomiting and a headache. Patient states she also had chills. Patient states the symptoms lasted from 02/16/2024 through 02/19/24. Patient states she wanted to report the reaction.

## 2024-02-28 ENCOUNTER — Telehealth: Payer: Self-pay | Admitting: Pharmacist

## 2024-02-28 NOTE — Telephone Encounter (Signed)
 Reached out to patient regarding site of care for IV Orencia . She lives in [place] and may be interested in going to [place] infusion. Left VM for patient  If interested, will need to know her next infusion date so we can schedule appropriately.,  Requested return call even if not interested  Sherry Pennant, PharmD, MPH, BCPS, CPP Clinical Pharmacist

## 2024-04-12 ENCOUNTER — Ambulatory Visit: Payer: Medicare Other | Admitting: Neurology

## 2024-07-03 ENCOUNTER — Ambulatory Visit: Admitting: Rheumatology
